# Patient Record
Sex: Male | Born: 1970 | Race: Black or African American | Hispanic: No | Marital: Single | Smoking: Current every day smoker
Health system: Southern US, Community
[De-identification: ages and names within clinical notes are randomized; demographics above are authoritative.]

---

## 2021-04-29 ENCOUNTER — Emergency Department (HOSPITAL_COMMUNITY): Payer: Medicaid Other

## 2021-04-29 ENCOUNTER — Inpatient Hospital Stay (HOSPITAL_COMMUNITY)
Admission: EM | Admit: 2021-04-29 | Discharge: 2021-07-22 | DRG: 004 | Disposition: A | Discharge status: Skilled Nursing Facility | Payer: Medicaid Other | Attending: Internal Medicine | Admitting: Internal Medicine

## 2021-04-29 DIAGNOSIS — I469 Cardiac arrest, cause unspecified: Secondary | ICD-10-CM | POA: Diagnosis not present

## 2021-04-29 DIAGNOSIS — F17210 Nicotine dependence, cigarettes, uncomplicated: Secondary | ICD-10-CM | POA: Diagnosis present

## 2021-04-29 DIAGNOSIS — R569 Unspecified convulsions: Secondary | ICD-10-CM | POA: Diagnosis present

## 2021-04-29 DIAGNOSIS — E870 Hyperosmolality and hypernatremia: Secondary | ICD-10-CM | POA: Diagnosis not present

## 2021-04-29 DIAGNOSIS — R509 Fever, unspecified: Secondary | ICD-10-CM

## 2021-04-29 DIAGNOSIS — G928 Other toxic encephalopathy: Secondary | ICD-10-CM | POA: Diagnosis present

## 2021-04-29 DIAGNOSIS — J40 Bronchitis, not specified as acute or chronic: Secondary | ICD-10-CM | POA: Diagnosis not present

## 2021-04-29 DIAGNOSIS — Z452 Encounter for adjustment and management of vascular access device: Secondary | ICD-10-CM

## 2021-04-29 DIAGNOSIS — Z20822 Contact with and (suspected) exposure to covid-19: Secondary | ICD-10-CM | POA: Diagnosis present

## 2021-04-29 DIAGNOSIS — I110 Hypertensive heart disease with heart failure: Secondary | ICD-10-CM | POA: Diagnosis present

## 2021-04-29 DIAGNOSIS — Z1612 Extended spectrum beta lactamase (ESBL) resistance: Secondary | ICD-10-CM | POA: Diagnosis not present

## 2021-04-29 DIAGNOSIS — L89620 Pressure ulcer of left heel, unstageable: Secondary | ICD-10-CM | POA: Diagnosis not present

## 2021-04-29 DIAGNOSIS — Z66 Do not resuscitate: Secondary | ICD-10-CM | POA: Diagnosis not present

## 2021-04-29 DIAGNOSIS — I429 Cardiomyopathy, unspecified: Secondary | ICD-10-CM | POA: Diagnosis present

## 2021-04-29 DIAGNOSIS — K5981 Ogilvie syndrome: Secondary | ICD-10-CM | POA: Diagnosis not present

## 2021-04-29 DIAGNOSIS — I5043 Acute on chronic combined systolic (congestive) and diastolic (congestive) heart failure: Secondary | ICD-10-CM | POA: Diagnosis present

## 2021-04-29 DIAGNOSIS — N390 Urinary tract infection, site not specified: Secondary | ICD-10-CM | POA: Diagnosis not present

## 2021-04-29 DIAGNOSIS — N170 Acute kidney failure with tubular necrosis: Secondary | ICD-10-CM | POA: Diagnosis present

## 2021-04-29 DIAGNOSIS — B9689 Other specified bacterial agents as the cause of diseases classified elsewhere: Secondary | ICD-10-CM | POA: Diagnosis not present

## 2021-04-29 DIAGNOSIS — I3139 Other pericardial effusion (noninflammatory): Secondary | ICD-10-CM | POA: Diagnosis present

## 2021-04-29 DIAGNOSIS — I69354 Hemiplegia and hemiparesis following cerebral infarction affecting left non-dominant side: Secondary | ICD-10-CM

## 2021-04-29 DIAGNOSIS — J15211 Pneumonia due to Methicillin susceptible Staphylococcus aureus: Secondary | ICD-10-CM | POA: Diagnosis present

## 2021-04-29 DIAGNOSIS — R0989 Other specified symptoms and signs involving the circulatory and respiratory systems: Secondary | ICD-10-CM

## 2021-04-29 DIAGNOSIS — K56609 Unspecified intestinal obstruction, unspecified as to partial versus complete obstruction: Secondary | ICD-10-CM | POA: Diagnosis not present

## 2021-04-29 DIAGNOSIS — J9601 Acute respiratory failure with hypoxia: Secondary | ICD-10-CM | POA: Diagnosis not present

## 2021-04-29 DIAGNOSIS — Z930 Tracheostomy status: Secondary | ICD-10-CM

## 2021-04-29 DIAGNOSIS — R1310 Dysphagia, unspecified: Secondary | ICD-10-CM | POA: Diagnosis not present

## 2021-04-29 DIAGNOSIS — Z8249 Family history of ischemic heart disease and other diseases of the circulatory system: Secondary | ICD-10-CM

## 2021-04-29 DIAGNOSIS — B370 Candidal stomatitis: Secondary | ICD-10-CM | POA: Diagnosis not present

## 2021-04-29 DIAGNOSIS — Z4659 Encounter for fitting and adjustment of other gastrointestinal appliance and device: Secondary | ICD-10-CM

## 2021-04-29 DIAGNOSIS — Z7401 Bed confinement status: Secondary | ICD-10-CM | POA: Diagnosis not present

## 2021-04-29 DIAGNOSIS — G253 Myoclonus: Secondary | ICD-10-CM | POA: Diagnosis present

## 2021-04-29 DIAGNOSIS — Z515 Encounter for palliative care: Secondary | ICD-10-CM

## 2021-04-29 DIAGNOSIS — J690 Pneumonitis due to inhalation of food and vomit: Secondary | ICD-10-CM | POA: Diagnosis not present

## 2021-04-29 DIAGNOSIS — K567 Ileus, unspecified: Secondary | ICD-10-CM | POA: Diagnosis not present

## 2021-04-29 DIAGNOSIS — R680 Hypothermia, not associated with low environmental temperature: Secondary | ICD-10-CM | POA: Diagnosis not present

## 2021-04-29 DIAGNOSIS — R570 Cardiogenic shock: Secondary | ICD-10-CM | POA: Diagnosis not present

## 2021-04-29 DIAGNOSIS — I5022 Chronic systolic (congestive) heart failure: Secondary | ICD-10-CM | POA: Diagnosis not present

## 2021-04-29 DIAGNOSIS — F1123 Opioid dependence with withdrawal: Secondary | ICD-10-CM | POA: Diagnosis not present

## 2021-04-29 DIAGNOSIS — A419 Sepsis, unspecified organism: Principal | ICD-10-CM | POA: Diagnosis present

## 2021-04-29 DIAGNOSIS — E1165 Type 2 diabetes mellitus with hyperglycemia: Secondary | ICD-10-CM | POA: Diagnosis present

## 2021-04-29 DIAGNOSIS — R110 Nausea: Secondary | ICD-10-CM

## 2021-04-29 DIAGNOSIS — R6521 Severe sepsis with septic shock: Secondary | ICD-10-CM | POA: Diagnosis present

## 2021-04-29 DIAGNOSIS — I5021 Acute systolic (congestive) heart failure: Secondary | ICD-10-CM | POA: Diagnosis not present

## 2021-04-29 DIAGNOSIS — R0600 Dyspnea, unspecified: Secondary | ICD-10-CM

## 2021-04-29 DIAGNOSIS — E785 Hyperlipidemia, unspecified: Secondary | ICD-10-CM | POA: Diagnosis present

## 2021-04-29 DIAGNOSIS — E860 Dehydration: Secondary | ICD-10-CM | POA: Diagnosis not present

## 2021-04-29 DIAGNOSIS — R0902 Hypoxemia: Secondary | ICD-10-CM

## 2021-04-29 DIAGNOSIS — T361X5A Adverse effect of cephalosporins and other beta-lactam antibiotics, initial encounter: Secondary | ICD-10-CM | POA: Diagnosis not present

## 2021-04-29 DIAGNOSIS — G931 Anoxic brain damage, not elsewhere classified: Secondary | ICD-10-CM | POA: Diagnosis present

## 2021-04-29 DIAGNOSIS — N179 Acute kidney failure, unspecified: Secondary | ICD-10-CM | POA: Diagnosis not present

## 2021-04-29 DIAGNOSIS — R0602 Shortness of breath: Secondary | ICD-10-CM

## 2021-04-29 DIAGNOSIS — Z9911 Dependence on respirator [ventilator] status: Secondary | ICD-10-CM | POA: Diagnosis not present

## 2021-04-29 DIAGNOSIS — R403 Persistent vegetative state: Secondary | ICD-10-CM | POA: Diagnosis not present

## 2021-04-29 DIAGNOSIS — J189 Pneumonia, unspecified organism: Secondary | ICD-10-CM | POA: Diagnosis not present

## 2021-04-29 DIAGNOSIS — I5020 Unspecified systolic (congestive) heart failure: Secondary | ICD-10-CM | POA: Diagnosis not present

## 2021-04-29 DIAGNOSIS — J432 Centrilobular emphysema: Secondary | ICD-10-CM | POA: Diagnosis present

## 2021-04-29 DIAGNOSIS — Z7189 Other specified counseling: Secondary | ICD-10-CM

## 2021-04-29 DIAGNOSIS — E871 Hypo-osmolality and hyponatremia: Secondary | ICD-10-CM | POA: Diagnosis not present

## 2021-04-29 DIAGNOSIS — R0603 Acute respiratory distress: Secondary | ICD-10-CM

## 2021-04-29 DIAGNOSIS — B961 Klebsiella pneumoniae [K. pneumoniae] as the cause of diseases classified elsewhere: Secondary | ICD-10-CM | POA: Diagnosis not present

## 2021-04-29 DIAGNOSIS — F411 Generalized anxiety disorder: Secondary | ICD-10-CM | POA: Diagnosis present

## 2021-04-29 DIAGNOSIS — I272 Pulmonary hypertension, unspecified: Secondary | ICD-10-CM | POA: Diagnosis present

## 2021-04-29 DIAGNOSIS — Z823 Family history of stroke: Secondary | ICD-10-CM

## 2021-04-29 DIAGNOSIS — E875 Hyperkalemia: Secondary | ICD-10-CM | POA: Diagnosis not present

## 2021-04-29 DIAGNOSIS — M6282 Rhabdomyolysis: Secondary | ICD-10-CM | POA: Diagnosis present

## 2021-04-29 DIAGNOSIS — G8929 Other chronic pain: Secondary | ICD-10-CM | POA: Diagnosis not present

## 2021-04-29 DIAGNOSIS — R911 Solitary pulmonary nodule: Secondary | ICD-10-CM | POA: Diagnosis present

## 2021-04-29 DIAGNOSIS — A499 Bacterial infection, unspecified: Secondary | ICD-10-CM

## 2021-04-29 DIAGNOSIS — E876 Hypokalemia: Secondary | ICD-10-CM | POA: Diagnosis not present

## 2021-04-29 DIAGNOSIS — Z79899 Other long term (current) drug therapy: Secondary | ICD-10-CM

## 2021-04-29 DIAGNOSIS — R140 Abdominal distension (gaseous): Secondary | ICD-10-CM

## 2021-04-29 DIAGNOSIS — Z0189 Encounter for other specified special examinations: Secondary | ICD-10-CM

## 2021-04-29 DIAGNOSIS — R0682 Tachypnea, not elsewhere classified: Secondary | ICD-10-CM

## 2021-04-29 DIAGNOSIS — D6489 Other specified anemias: Secondary | ICD-10-CM | POA: Diagnosis not present

## 2021-04-29 LAB — COMPREHENSIVE METABOLIC PANEL
ALT: 31 U/L (ref 0–44)
AST: 34 U/L (ref 15–41)
Albumin: 3 g/dL — ABNORMAL LOW (ref 3.5–5.0)
Alkaline Phosphatase: 49 U/L (ref 38–126)
Anion gap: 17 — ABNORMAL HIGH (ref 5–15)
BUN: 15 mg/dL (ref 6–20)
CO2: 14 mmol/L — ABNORMAL LOW (ref 22–32)
Calcium: 7.7 mg/dL — ABNORMAL LOW (ref 8.9–10.3)
Chloride: 109 mmol/L (ref 98–111)
Creatinine, Ser: 1.47 mg/dL — ABNORMAL HIGH (ref 0.61–1.24)
GFR, Estimated: 57 mL/min — ABNORMAL LOW (ref >60)
Glucose, Bld: 252 mg/dL — ABNORMAL HIGH (ref 70–99)
Potassium: 3.9 mmol/L (ref 3.5–5.1)
Sodium: 140 mmol/L (ref 135–145)
Total Bilirubin: 0.9 mg/dL (ref 0.3–1.2)
Total Protein: 5.7 g/dL — ABNORMAL LOW (ref 6.5–8.1)

## 2021-04-29 LAB — CBC WITH DIFFERENTIAL/PLATELET
Abs Immature Granulocytes: 0.53 10*3/uL — ABNORMAL HIGH (ref 0.00–0.07)
Basophils Absolute: 0.1 10*3/uL (ref 0.0–0.1)
Basophils Relative: 1 %
Eosinophils Absolute: 0.2 10*3/uL (ref 0.0–0.5)
Eosinophils Relative: 1 %
HCT: 46.4 % (ref 39.0–52.0)
Hemoglobin: 14.1 g/dL (ref 13.0–17.0)
Immature Granulocytes: 3 %
Lymphocytes Relative: 45 %
Lymphs Abs: 8.1 10*3/uL — ABNORMAL HIGH (ref 0.7–4.0)
MCH: 28.7 pg (ref 26.0–34.0)
MCHC: 30.4 g/dL (ref 30.0–36.0)
MCV: 94.3 fL (ref 80.0–100.0)
Monocytes Absolute: 0.7 10*3/uL (ref 0.1–1.0)
Monocytes Relative: 4 %
Neutro Abs: 8.1 10*3/uL — ABNORMAL HIGH (ref 1.7–7.7)
Neutrophils Relative %: 46 %
Platelets: 440 10*3/uL — ABNORMAL HIGH (ref 150–400)
RBC: 4.92 MIL/uL (ref 4.22–5.81)
RDW: 13.9 % (ref 11.5–15.5)
Smear Review: NORMAL
WBC: 17.6 10*3/uL — ABNORMAL HIGH (ref 4.0–10.5)
nRBC: 0.1 % (ref 0.0–0.2)

## 2021-04-29 LAB — I-STAT ARTERIAL BLOOD GAS, ED
Acid-base deficit: 14 mmol/L — ABNORMAL HIGH (ref 0.0–2.0)
Bicarbonate: 14.1 mmol/L — ABNORMAL LOW (ref 20.0–28.0)
Calcium, Ion: 1.1 mmol/L — ABNORMAL LOW (ref 1.15–1.40)
HCT: 39 % (ref 39.0–52.0)
Hemoglobin: 13.3 g/dL (ref 13.0–17.0)
O2 Saturation: 95 %
Patient temperature: 97.2
Potassium: 3.8 mmol/L (ref 3.5–5.1)
Sodium: 140 mmol/L (ref 135–145)
TCO2: 15 mmol/L — ABNORMAL LOW (ref 22–32)
pCO2 arterial: 37.1 mmHg (ref 32.0–48.0)
pH, Arterial: 7.183 — CL (ref 7.350–7.450)
pO2, Arterial: 90 mmHg (ref 83.0–108.0)

## 2021-04-29 LAB — I-STAT CHEM 8, ED
BUN: 17 mg/dL (ref 6–20)
Calcium, Ion: 1.04 mmol/L — ABNORMAL LOW (ref 1.15–1.40)
Chloride: 109 mmol/L (ref 98–111)
Creatinine, Ser: 1.2 mg/dL (ref 0.61–1.24)
Glucose, Bld: 240 mg/dL — ABNORMAL HIGH (ref 70–99)
HCT: 44 % (ref 39.0–52.0)
Hemoglobin: 15 g/dL (ref 13.0–17.0)
Potassium: 3.6 mmol/L (ref 3.5–5.1)
Sodium: 143 mmol/L (ref 135–145)
TCO2: 20 mmol/L — ABNORMAL LOW (ref 22–32)

## 2021-04-29 LAB — RESP PANEL BY RT-PCR (FLU A&B, COVID) ARPGX2
Influenza A by PCR: NEGATIVE
Influenza B by PCR: NEGATIVE
SARS Coronavirus 2 by RT PCR: NEGATIVE

## 2021-04-29 LAB — PHOSPHORUS: Phosphorus: 7 mg/dL — ABNORMAL HIGH (ref 2.5–4.6)

## 2021-04-29 LAB — MAGNESIUM: Magnesium: 2.1 mg/dL (ref 1.7–2.4)

## 2021-04-29 LAB — GLUCOSE, CAPILLARY: Glucose-Capillary: 124 mg/dL — ABNORMAL HIGH (ref 70–99)

## 2021-04-29 LAB — BRAIN NATRIURETIC PEPTIDE: B Natriuretic Peptide: 626.7 pg/mL — ABNORMAL HIGH (ref 0.0–100.0)

## 2021-04-29 LAB — LACTIC ACID, PLASMA: Lactic Acid, Venous: 10.4 mmol/L (ref 0.5–1.9)

## 2021-04-29 LAB — TROPONIN I (HIGH SENSITIVITY): Troponin I (High Sensitivity): 50 ng/L — ABNORMAL HIGH (ref <18)

## 2021-04-29 MED ORDER — FENTANYL CITRATE (PF) 100 MCG/2ML IJ SOLN
100.0000 ug | INTRAMUSCULAR | Status: DC | PRN
Start: 2021-04-29 — End: 2021-05-07
  Administered 2021-04-29 – 2021-05-02 (×6): 100 ug via INTRAVENOUS
  Administered 2021-05-02: 50 ug via INTRAVENOUS
  Administered 2021-05-02 – 2021-05-03 (×4): 100 ug via INTRAVENOUS
  Administered 2021-05-03: 50 ug via INTRAVENOUS
  Administered 2021-05-04 – 2021-05-05 (×4): 100 ug via INTRAVENOUS
  Filled 2021-04-29 (×15): qty 2

## 2021-04-29 MED ORDER — SODIUM CHLORIDE 0.9 % IV SOLN
2.0000 g | Freq: Once | INTRAVENOUS | Status: AC
  Administered 2021-04-30: 2 g via INTRAVENOUS
  Filled 2021-04-29: qty 2

## 2021-04-29 MED ORDER — NOREPINEPHRINE 4 MG/250ML-% IV SOLN
2.0000 ug/min | INTRAVENOUS | Status: DC
  Administered 2021-04-30: 8 ug/min via INTRAVENOUS
  Filled 2021-04-29: qty 250

## 2021-04-29 MED ORDER — SODIUM CHLORIDE 0.9 % IV SOLN
250.0000 mL | INTRAVENOUS | Status: DC
  Administered 2021-04-30 – 2021-06-15 (×12): 250 mL via INTRAVENOUS

## 2021-04-29 MED ORDER — IOHEXOL 350 MG/ML SOLN
50.0000 mL | Freq: Once | INTRAVENOUS | Status: AC | PRN
  Administered 2021-04-29: 50 mL via INTRAVENOUS

## 2021-04-29 MED ORDER — FENTANYL CITRATE (PF) 100 MCG/2ML IJ SOLN
100.0000 ug | INTRAMUSCULAR | Status: DC | PRN
  Administered 2021-04-29: 100 ug via INTRAVENOUS
  Filled 2021-04-29 (×2): qty 2

## 2021-04-29 MED ORDER — VANCOMYCIN HCL 1500 MG/300ML IV SOLN
1500.0000 mg | Freq: Every day | INTRAVENOUS | Status: DC
  Administered 2021-04-30: 1500 mg via INTRAVENOUS
  Filled 2021-04-29: qty 300

## 2021-04-29 MED ORDER — SUCCINYLCHOLINE CHLORIDE 20 MG/ML IJ SOLN
INTRAMUSCULAR | Status: AC | PRN
  Administered 2021-04-29: 100 mg via INTRAVENOUS

## 2021-04-29 MED ORDER — SODIUM CHLORIDE 0.9 % IV SOLN
2.0000 g | Freq: Three times a day (TID) | INTRAVENOUS | Status: DC
  Administered 2021-04-30: 2 g via INTRAVENOUS
  Filled 2021-04-29: qty 2

## 2021-04-29 MED ORDER — SODIUM CHLORIDE 0.9 % IV SOLN: INTRAVENOUS | Status: DC

## 2021-04-29 MED ORDER — LORAZEPAM 2 MG/ML IJ SOLN
INTRAMUSCULAR | Status: AC
  Filled 2021-04-29: qty 1

## 2021-04-29 MED ORDER — SODIUM CHLORIDE 0.9 % IV BOLUS
1000.0000 mL | Freq: Once | INTRAVENOUS | Status: AC
  Administered 2021-04-29: 1000 mL via INTRAVENOUS

## 2021-04-29 MED ORDER — HEPARIN SODIUM (PORCINE) 5000 UNIT/ML IJ SOLN
5000.0000 [IU] | Freq: Three times a day (TID) | INTRAMUSCULAR | Status: DC
  Administered 2021-04-30 – 2021-07-22 (×247): 5000 [IU] via SUBCUTANEOUS
  Filled 2021-04-29 (×244): qty 1

## 2021-04-29 MED ORDER — PROPOFOL 1000 MG/100ML IV EMUL
5.0000 ug/kg/min | INTRAVENOUS | Status: DC
  Administered 2021-04-29: 25 ug/kg/min via INTRAVENOUS

## 2021-04-29 MED ORDER — CHLORHEXIDINE GLUCONATE 0.12% ORAL RINSE (MEDLINE KIT)
15.0000 mL | Freq: Two times a day (BID) | OROMUCOSAL | Status: DC
  Administered 2021-04-30 – 2021-05-31 (×65): 15 mL via OROMUCOSAL

## 2021-04-29 MED ORDER — ETOMIDATE 2 MG/ML IV SOLN
INTRAVENOUS | Status: AC | PRN
  Administered 2021-04-29: 40 mg via INTRAVENOUS

## 2021-04-29 MED ORDER — MIDAZOLAM HCL 2 MG/2ML IJ SOLN
2.0000 mg | INTRAMUSCULAR | Status: DC | PRN
  Filled 2021-04-29: qty 2

## 2021-04-29 MED ORDER — INSULIN ASPART 100 UNIT/ML IJ SOLN
0.0000 [IU] | INTRAMUSCULAR | Status: DC
  Administered 2021-04-30 – 2021-05-02 (×5): 2 [IU] via SUBCUTANEOUS
  Administered 2021-05-02: 3 [IU] via SUBCUTANEOUS
  Administered 2021-05-02 – 2021-05-03 (×3): 2 [IU] via SUBCUTANEOUS
  Administered 2021-05-03: 3 [IU] via SUBCUTANEOUS
  Administered 2021-05-03 – 2021-05-19 (×31): 2 [IU] via SUBCUTANEOUS
  Administered 2021-05-20: 1 [IU] via SUBCUTANEOUS

## 2021-04-29 MED ORDER — NOREPINEPHRINE 4 MG/250ML-% IV SOLN
0.0000 ug/min | INTRAVENOUS | Status: DC
Start: 2021-04-29 — End: 2021-04-29
  Administered 2021-04-29: 5 ug/min via INTRAVENOUS

## 2021-04-29 MED ORDER — MIDAZOLAM HCL 2 MG/2ML IJ SOLN
2.0000 mg | INTRAMUSCULAR | Status: AC | PRN
Start: 2021-04-29 — End: 2021-05-01
  Administered 2021-04-30 – 2021-05-01 (×3): 2 mg via INTRAVENOUS
  Filled 2021-04-29 (×2): qty 2

## 2021-04-29 MED ORDER — ORAL CARE MOUTH RINSE
15.0000 mL | OROMUCOSAL | Status: DC
  Administered 2021-04-30 – 2021-05-30 (×305): 15 mL via OROMUCOSAL

## 2021-04-29 MED ORDER — SODIUM CHLORIDE 0.9 % IV SOLN
500.0000 mg | Freq: Once | INTRAVENOUS | Status: AC
  Administered 2021-04-30: 500 mg via INTRAVENOUS
  Filled 2021-04-29: qty 500

## 2021-04-29 MED ORDER — PANTOPRAZOLE SODIUM 40 MG IV SOLR
40.0000 mg | Freq: Every day | INTRAVENOUS | Status: DC
  Administered 2021-04-30: 40 mg via INTRAVENOUS
  Filled 2021-04-29: qty 40

## 2021-04-29 MED ORDER — FENTANYL BOLUS VIA INFUSION
25.0000 ug | INTRAVENOUS | Status: DC | PRN
Start: 2021-04-29 — End: 2021-05-01
  Administered 2021-04-30: 25 ug via INTRAVENOUS
  Filled 2021-04-29: qty 100

## 2021-04-29 MED ORDER — FENTANYL CITRATE (PF) 100 MCG/2ML IJ SOLN: 25.0000 ug | Freq: Once | INTRAMUSCULAR | Status: DC

## 2021-04-29 MED ORDER — SODIUM BICARBONATE 8.4 % IV SOLN
100.0000 meq | Freq: Once | INTRAVENOUS | Status: AC
  Administered 2021-04-29: 100 meq via INTRAVENOUS

## 2021-04-29 MED ORDER — LACTATED RINGERS IV BOLUS
1000.0000 mL | Freq: Once | INTRAVENOUS | Status: AC
  Administered 2021-04-30: 1000 mL via INTRAVENOUS

## 2021-04-29 MED ORDER — PROPOFOL 1000 MG/100ML IV EMUL: 5.0000 ug/kg/min | INTRAVENOUS | Status: DC

## 2021-04-29 MED ORDER — FENTANYL 2500MCG IN NS 250ML (10MCG/ML) PREMIX INFUSION
25.0000 ug/h | INTRAVENOUS | Status: DC
  Administered 2021-04-29: 50 ug/h via INTRAVENOUS
  Administered 2021-05-01: 100 ug/h via INTRAVENOUS
  Filled 2021-04-29 (×2): qty 250

## 2021-04-29 NOTE — Progress Notes (Signed)
Pharmacy Antibiotic Note  Vincent [name] is a 50 y.o. male admitted on 04/29/2021 with sepsis.  Pharmacy has been consulted for vancomycin and cefepime dosing.  Plan: Vancomycin 1500mg  IV q24h (eAUC 473, Cr 1.2mg /dL Cefepime 2g IV -Monitor renal function, clinical status, and antibiotic plan  Height: 5\' 8"  (172.7 cm) Weight: 70 kg (154 lb 5.2 oz) IBW/kg (Calculated) : 68.4  Temp (24hrs), Avg:95.6 F (35.3 C), Min:93.8 F (34.3 C), Max:97.3 F (36.3 C)  Recent Labs  Lab 04/29/21 2040 04/29/21 2041 04/29/21 2052  WBC 17.6*  --   --   CREATININE 1.47*  --  1.20  LATICACIDVEN  --  10.4*  --     Estimated Creatinine Clearance: 70.5 mL/min (by C-G formula based on SCr of 1.2 mg/dL).    No Known Allergies  Antimicrobials this admission: Cefepime 8/2 >>  Vanc 8/2 >>  Flagyl x1   Dose adjustments this admission: N/A  Microbiology results: Nothing ordered  Thank you for allowing pharmacy to be a part of this patient's care.  06/29/21, PharmD, [facility] Emergency Medicine Clinical Pharmacist ED RPh Phone: [phone] Main RX: [phone]

## 2021-04-29 NOTE — ED Provider Notes (Signed)
Emergency Department Provider Note   I have reviewed the triage vital signs and the nursing notes.   HISTORY  Chief Complaint Post CPR   HPI Vincent [name] is a 50 y.o. male presents to the emergency department after cardiac arrest s/p ROSC with EMS. They state they were called on scene with report of respiratory distress.  They state when they arrived the patient seemed to have more agonal respirations with bradycardia but seemed less like respiratory distress to them.  Patient was unable to provide significant history.  He ultimately lost pulses and CPR was initiated.  He received an epi push and a King airway was placed.  IO access was established and an epi drip was started.  EMS was also able to establish an 18-gauge IV in the left AC. ROSC achieved and patient transported. No defib required. While pulseless patient was in PEA per bedside report.   Level 5 caveat: post-CPR  Patient's niece arrives to bedside to provide additional history.  She states that the patient has a prior history of stroke with baseline left-sided weakness.  He lives at home but is independent.  He lives with another disabled family member.  Family is unsure if he has been receiving care or taking medicines over the past several months.  They report that he woke up feeling dizzy and not well this morning.  He had what sounds like a syncope event witnessed by family at which point they realized he was not breathing well and called EMS.   No past medical history on file.  Patient Active Problem List   Diagnosis Date Noted   Cardiac arrest [facility]) 04/29/2021    Allergies Patient has no known allergies.  No family history on file.  Social History    Review of Systems  Level 5 caveat: Post CPR  ____________________________________________   PHYSICAL EXAM:  VITAL SIGNS: ED Triage Vitals [04/29/21 2042]  Enc Vitals Group     BP (!) 79/62     Pulse Rate (!) 104     Resp 18     Temp (!)  97.3 F (36.3 C)     Temp Source Temporal     SpO2 95 %   Constitutional: Unresponsive but spontaneous respirations noted.  Eyes: Conjunctivae are normal. PERRL (3 mm and sluggish).  Head: Atraumatic. Nose: No congestion/rhinnorhea. Mouth/Throat: King airway in place without visible vomitus or blood in the airway.  Neck: No stridor.   Cardiovascular: Tachycardia. Good peripheral circulation. Grossly normal heart sounds.   Respiratory: Spontaneous respirations.  No retractions. Lungs CTAB. Gastrointestinal: Soft abdomen with distention.  Musculoskeletal: No gross deformities of extremities. Neurologic: Unresponsive to pain but spontaneous respirations noted.  Skin:  Skin is warm, dry and intact. No rash noted.  ____________________________________________   LABS (all labs ordered are listed, but only abnormal results are displayed)  Labs Reviewed  COMPREHENSIVE METABOLIC PANEL - Abnormal; Notable for the following components:      Result Value   CO2 14 (*)    Glucose, Bld 252 (*)    Creatinine, Ser 1.47 (*)    Calcium 7.7 (*)    Total Protein 5.7 (*)    Albumin 3.0 (*)    GFR, Estimated 57 (*)    Anion gap 17 (*)    All other components within normal limits  CBC WITH DIFFERENTIAL/PLATELET - Abnormal; Notable for the following components:   WBC 17.6 (*)    Platelets 440 (*)    Neutro Abs 8.1 (*)  Lymphs Abs 8.1 (*)    Abs Immature Granulocytes 0.53 (*)    All other components within normal limits  LACTIC ACID, PLASMA - Abnormal; Notable for the following components:   Lactic Acid, Venous 10.4 (*)    All other components within normal limits  LACTIC ACID, PLASMA - Abnormal; Notable for the following components:   Lactic Acid, Venous 7.1 (*)    All other components within normal limits  PHOSPHORUS - Abnormal; Notable for the following components:   Phosphorus 7.0 (*)    All other components within normal limits  BRAIN NATRIURETIC PEPTIDE - Abnormal; Notable for the  following components:   B Natriuretic Peptide 626.7 (*)    All other components within normal limits  ACETAMINOPHEN LEVEL - Abnormal; Notable for the following components:   Acetaminophen (Tylenol), Serum <10 (*)    All other components within normal limits  SALICYLATE LEVEL - Abnormal; Notable for the following components:   Salicylate Lvl <3.4 (*)    All other components within normal limits  RAPID URINE DRUG SCREEN, HOSP PERFORMED - Abnormal; Notable for the following components:   Benzodiazepines POSITIVE (*)    Tetrahydrocannabinol POSITIVE (*)    All other components within normal limits  BASIC METABOLIC PANEL - Abnormal; Notable for the following components:   Potassium 5.3 (*)    CO2 14 (*)    BUN 21 (*)    Creatinine, Ser 1.80 (*)    Calcium 7.3 (*)    GFR, Estimated 45 (*)    All other components within normal limits  PROTIME-INR - Abnormal; Notable for the following components:   Prothrombin Time 15.5 (*)    All other components within normal limits  CBC - Abnormal; Notable for the following components:   WBC 20.1 (*)    Platelets 463 (*)    All other components within normal limits  BASIC METABOLIC PANEL - Abnormal; Notable for the following components:   CO2 15 (*)    Glucose, Bld 124 (*)    Creatinine, Ser 1.50 (*)    Calcium 7.9 (*)    GFR, Estimated 56 (*)    All other components within normal limits  PHOSPHORUS - Abnormal; Notable for the following components:   Phosphorus 5.3 (*)    All other components within normal limits  HEMOGLOBIN A1C - Abnormal; Notable for the following components:   Hgb A1c MFr Bld 5.8 (*)    All other components within normal limits  GLUCOSE, CAPILLARY - Abnormal; Notable for the following components:   Glucose-Capillary 124 (*)    All other components within normal limits  GLUCOSE, CAPILLARY - Abnormal; Notable for the following components:   Glucose-Capillary 120 (*)    All other components within normal limits  GLUCOSE,  CAPILLARY - Abnormal; Notable for the following components:   Glucose-Capillary 120 (*)    All other components within normal limits  GLUCOSE, CAPILLARY - Abnormal; Notable for the following components:   Glucose-Capillary 128 (*)    All other components within normal limits  I-STAT CHEM 8, ED - Abnormal; Notable for the following components:   Glucose, Bld 240 (*)    Calcium, Ion 1.04 (*)    TCO2 20 (*)    All other components within normal limits  I-STAT ARTERIAL BLOOD GAS, ED - Abnormal; Notable for the following components:   pH, Arterial 7.183 (*)    Bicarbonate 14.1 (*)    TCO2 15 (*)    Acid-base deficit 14.0 (*)  Calcium, Ion 1.10 (*)    All other components within normal limits  POCT I-STAT 7, (LYTES, BLD GAS, ICA,H+H) - Abnormal; Notable for the following components:   pH, Arterial 7.301 (*)    pO2, Arterial 63 (*)    Bicarbonate 16.4 (*)    TCO2 17 (*)    Acid-base deficit 9.0 (*)    Calcium, Ion 1.05 (*)    All other components within normal limits  POCT I-STAT 7, (LYTES, BLD GAS, ICA,H+H) - Abnormal; Notable for the following components:   pCO2 arterial 25.2 (*)    Bicarbonate 14.9 (*)    TCO2 16 (*)    Acid-base deficit 9.0 (*)    Calcium, Ion 1.05 (*)    All other components within normal limits  TROPONIN I (HIGH SENSITIVITY) - Abnormal; Notable for the following components:   Troponin I (High Sensitivity) 50 (*)    All other components within normal limits  TROPONIN I (HIGH SENSITIVITY) - Abnormal; Notable for the following components:   Troponin I (High Sensitivity) 143 (*)    All other components within normal limits  RESP PANEL BY RT-PCR (FLU A&B, COVID) ARPGX2  MRSA NEXT GEN BY PCR, NASAL  MAGNESIUM  ETHANOL  APTT  MAGNESIUM  PATHOLOGIST SMEAR REVIEW  BLOOD GAS, ARTERIAL  BLOOD GAS, ARTERIAL  TROPONIN I (HIGH SENSITIVITY)   ____________________________________________  EKG   EKG Interpretation  Date/Time:  Tuesday April 29 2021 20:39:48  EDT Ventricular Rate:  106 PR Interval:  139 QRS Duration: 88 QT Interval:  395 QTC Calculation: 525 R Axis:   -8 Text Interpretation: Sinus tachycardia Biatrial enlargement Inferior infarct, old Lateral leads are also involved Prolonged QT interval No old tracing for comparison Confirmed by Nanda Quinton [phone]) on 04/29/2021 8:47:20 PM Also confirmed by Nanda Quinton [phone]), editor Hattie Perch ([id number])  on 04/30/2021 7:52:39 AM        ____________________________________________  RADIOLOGY  CT HEAD WO CONTRAST (5MM)  Result Date: 04/29/2021 CLINICAL DATA:  Mental status change EXAM: CT HEAD WITHOUT CONTRAST TECHNIQUE: Contiguous axial images were obtained from the base of the skull through the vertex without intravenous contrast. COMPARISON:  None. FINDINGS: Brain: No acute territorial infarction, hemorrhage or intracranial mass is visualized. Chronic right MCA infarct with extensive encephalomalacia involving the right frontal, parietal and temporal lobes as well as the right thalamus and basal ganglia. Moderate atrophy. Ex vacuo dilatation of right lateral ventricle. Atrophy of right brainstem. Vascular: No hyperdense vessels.  Carotid vascular calcification Skull: Normal. Negative for fracture or focal lesion. Sinuses/Orbits: Mucosal thickening in the sinuses. Chronic appearing deformity of the medial wall right orbit. Other: Incomplete fusion posterior arch of C1 IMPRESSION: 1. No definite CT evidence for acute intracranial abnormality. 2. Atrophy and chronic right MCA infarct. Electronically Signed   By: Donavan Foil M.D.   On: 04/29/2021 22:09   CT Angio Chest PE W and/or Wo Contrast  Result Date: 04/29/2021 CLINICAL DATA:  PE suspected, high prob Post CPR. EXAM: CT ANGIOGRAPHY CHEST WITH CONTRAST TECHNIQUE: Multidetector CT imaging of the chest was performed using the standard protocol during bolus administration of intravenous contrast. Multiplanar CT image reconstructions and MIPs  were obtained to evaluate the vascular anatomy. CONTRAST:  22m OMNIPAQUE IOHEXOL 350 MG/ML SOLN COMPARISON:  Chest radiograph earlier today. FINDINGS: Cardiovascular: There are no filling defects within the pulmonary arteries to suggest pulmonary embolus. Mild aortic atherosclerosis. Cannot assess for dissection given phase of contrast tailored to pulmonary arteries S1. Multi chamber cardiomegaly. Minimal  contrast refluxes into the hepatic veins and IVC. Moderate size circumferential pericardial effusion. This measures up to 17 mm in depth adjacent to the right ventricle. Mediastinum/Nodes: Shotty mediastinal adenopathy, including right anterior paratracheal node measuring 10 mm, series 5, image 37. Bilateral hilar lymph nodes measuring 9-10 mm. The esophagus is decompressed by enteric tube. No visualized thyroid nodule. Lungs/Pleura: The endotracheal tube tip is at the level of the carina, recommend retraction of 2-3 cm. Dense lower lobe consolidation, left greater than right, suspicious for aspiration. There additional patchy, ground-glass and confluent airspace opacities throughout both lungs. Mild smooth septal thickening. Underlying emphysema which is partially obscured by superimposed airspace disease. There is a 2.1 x 2.6 cm nodular density posteriorly in the left upper lobe abutting the pleura, series 6, image 26, partially obscured by adjacent airspace disease. Small bilateral pleural effusions, as well as fluid tracking into the right minor fissure. No pneumothorax. Upper Abdomen: No adrenal nodule. No acute upper abdominal findings. Probable scarring in the upper left kidney. Motion obscures evaluation of the upper abdomen. Musculoskeletal: No acute osseous abnormality. No anterior rib fractures typically seen with CPR. No focal bone lesion. Review of the MIP images confirms the above findings. IMPRESSION: 1. No pulmonary embolus. 2. Multi chamber cardiomegaly with moderate circumferential pericardial  effusion. Minimal contrast refluxes into the hepatic veins and IVC consistent with elevated right heart pressures. 3. Dense lower lobe consolidation, left greater than right, suspicious for aspiration. Small bilateral pleural effusions. 4. There is set the thickening in ground-glass opacities suspicious for pulmonary edema. Superimposed airspace disease within there is a ground-glass opacity may represent confluent edema or infection. 5. Shotty mediastinal and hilar adenopathy is likely reactive, but nonspecific. 6. Endotracheal tube tip is at the level of the carina, recommend retraction of 2-3 cm. 7. A 2.1 x 2.6 cm nodular density posteriorly in the left upper lobe abutting the pleura is nonspecific given the adjacent parenchymal findings, however recommend attention at follow-up to exclude the possibility of pulmonary mass. Aortic Atherosclerosis (ICD10-I70.0) and Emphysema (ICD10-J43.9). Electronically Signed   By: Keith Rake M.D.   On: 04/29/2021 22:02   DG Chest Portable 1 View  Result Date: 04/29/2021 CLINICAL DATA:  Post CPR.  Cardiac arrest EXAM: PORTABLE CHEST 1 VIEW COMPARISON:  None FINDINGS: Endotracheal tube terminates 2.2 cm above carina. External pacer/defibrillator is. Midline trachea. Mild cardiomegaly. No pleural effusion or pneumothorax. Interstitial and airspace disease is relatively diffuse but greater on the left than right. IMPRESSION: Appropriate position of endotracheal tube. Cardiomegaly with left greater than right interstitial and airspace disease. Favor asymmetric pulmonary edema. Given asymmetry, aspiration is possible but felt less likely. Electronically Signed   By: Abigail Miyamoto M.D.   On: 04/29/2021 20:53   EEG adult  Result Date: 04/30/2021 Greta Doom, MD     04/30/2021  2:15 AM History: 50 year old male status post cardiac arrest Sedation: Propofol Technique: This is a 21 channel routine scalp EEG performed at the bedside with bipolar and monopolar montages  arranged in accordance to the international 10/20 system of electrode placement. One channel was dedicated to EKG recording. Background: The background is diffusely attenuated with ventilator artifact.  There is some degree of muscle artifact throughout most of the recording, but no definite background activity is seen.  He has several episodes of "shaking" without definite EEG change, other than significant muscle artifact.  With one of these episodes muscle artifact does significantly obscure the background, but to a degree I can tell there  was no significant EEG change. Photic stimulation: Physiologic driving is not performed EEG Abnormalities: Diffusely attenuated background Clinical Interpretation: This EEG is severely abnormal with diffuse attenuation of the background.  There was no evidence of the muscle jerking seen was epileptiform in nature.  There was no seizure or seizure predisposition recorded on this study. Please note that lack of epileptiform activity on EEG does not preclude the possibility of epilepsy. Roland Rack, MD [facility] [phone] If 7pm- 7am, please page neurology on call as listed in [place].   Overnight EEG with video  Result Date: 04/30/2021 Lora Havens, MD     04/30/2021  9:03 AM Patient Name: Vincent [name] MRN: [id number] Epilepsy Attending: Lora Havens Referring Physician/Provider: Montey Hora, PA Duration: 04/30/2021 0202 to 04/30/2021 0900 Patient history: 50 year old male status post cardiac arrest. EEG to evaluate for seizure Level of alertness:  comatose AEDs during EEG study: LEV, propofol Technical aspects: This EEG study was done with scalp electrodes positioned according to the 10-20 International system of electrode placement. Electrical activity was acquired at a sampling rate of 500Hz  and reviewed with a high frequency filter of 70Hz  and a low frequency filter of 1Hz . EEG data were recorded continuously and digitally stored.  Description: EEG showed continuous generalized background suppression.  EEG was not reactive to tactile stimulation.  Event button was pressed on 04/30/2021 at 0752 for tremors in arms and chest. Concomitant EEG before, during and after the event did not show any EEG changes suggest seizure. Hyperventilation and photic stimulation were not performed.   ABNORMALITY -Background suppression, generalized IMPRESSION: This study is suggestive of profound diffuse encephalopathy, nonspecific to etiology.  However with a history of cardiac arrest this could be secondary to anoxic/hypoxic brain injury, sedation.  No seizures or epileptiform discharges were seen throughout the recording. Event button was pressed on 04/30/2021 at 0752 for tremors in arms and chest without concomitant EEG change. This was most likely not an epileptic event. Priyanka Barbra Sarks    ____________________________________________   PROCEDURES  Procedure(s) performed:   Procedure Name: Intubation Date/Time: 04/29/2021 8:53 PM Performed by: Margette Fast, MD Pre-anesthesia Checklist: Patient identified, Patient being monitored, Emergency Drugs available and Suction available Preoxygenation: Pre-oxygenation with 100% oxygen Induction Type: Rapid sequence Ventilation: Oral airway inserted - appropriate to patient size and Mask ventilation without difficulty Laryngoscope Size: Glidescope and 4 Grade View: Grade III Tube size: 7.5 mm Number of attempts: 1 Airway Equipment and Method: Video-laryngoscopy Placement Confirmation: ETT inserted through vocal cords under direct vision, Positive ETCO2 and CO2 detector Secured at: 25 cm Tube secured with: ETT holder Dental Injury: Teeth and Oropharynx as per pre-operative assessment     .Critical Care  Date/Time: 04/30/2021 11:19 AM Performed by: Margette Fast, MD Authorized by: Margette Fast, MD   Critical care provider statement:    Critical care time (minutes):  75   Critical care time  was exclusive of:  Separately billable procedures and treating other patients and teaching time   Critical care was necessary to treat or prevent imminent or life-threatening deterioration of the following conditions:  Respiratory failure, circulatory failure, cardiac failure and shock   Critical care was time spent personally by me on the following activities:  Discussions with consultants, evaluation of patient's response to treatment, examination of patient, ordering and performing treatments and interventions, ordering and review of laboratory studies, ordering and review of radiographic studies, pulse oximetry, re-evaluation of patient's condition, obtaining history from patient or surrogate,  review of old charts, blood draw for specimens, development of treatment plan with patient or surrogate and ventilator management   I assumed direction of critical care for this patient from another provider in my specialty: no     Care discussed with: admitting provider     ____________________________________________   INITIAL IMPRESSION / [place] / ED COURSE  Pertinent labs & imaging results that were available during my care of the patient were reviewed by me and considered in my medical decision making (see chart for details).   Patient arrives to the emergency department after cardiac arrest.  EMS arrived on scene to find him with agonal respirations but describe his clinical appearance seeming less like respiratory distress.  He subsequently deteriorated and required brief CPR with epinephrine.  He arrives hypotensive, tachycardic, and with a King airway.  He is afebrile.  He is having some spontaneous respirations.  King airway was switched for endotracheal tube without difficulty.  Chest x-ray shows diffuse haziness by my bedside read.  Do not see an obvious pneumothorax.  Have initiated Levophed peripherally and will send labs, COVID screen, and obtain CT head and CT angio of the chest  to evaluate for PE if possible.   CT with pulmonary edema pattern, dilated heart, and pericardial effusion. No PE. CT head without acute changes. Labs with elevated lactate and leukocytosis. Will cover with abx. Patient tolerating levophed infusion and vent now transitioned to propofol for sedation. Some breathing over the vent and biting the tube at times. After propofol appears more comfortable.   Discussed patient's case with ICU to request admission. Patient and family (if present) updated with plan. Care transferred to ICU service.  I reviewed all nursing notes, vitals, pertinent old records, EKGs, labs, imaging (as available).  ____________________________________________  FINAL CLINICAL IMPRESSION(S) / ED DIAGNOSES  Final diagnoses:  Cardiac arrest ([place])  Acute respiratory failure with hypoxia ([place])     MEDICATIONS GIVEN DURING THIS VISIT:  Medications  fentaNYL (SUBLIMAZE) injection 100 mcg (100 mcg Intravenous Given 04/29/21 2119)  midazolam (VERSED) injection 2 mg (2 mg Intravenous Given 04/30/21 0141)  fentaNYL (SUBLIMAZE) injection 25 mcg (25 mcg Intravenous Not Given 04/29/21 2208)  fentaNYL 2536mg in NS 2597m(1046mml) infusion-PREMIX (75 mcg/hr Intravenous Infusion Verify 04/30/21 0800)  fentaNYL (SUBLIMAZE) bolus via infusion 25-100 mcg (25 mcg Intravenous Bolus from Bag 04/30/21 0136)  0.9 %  sodium chloride infusion ( Intravenous Paused 04/30/21 0756)  norepinephrine (LEVOPHED) 4mg26m 250mL51mmix infusion (4 mcg/min Intravenous Infusion Verify 04/30/21 0800)  heparin injection 5,000 Units (5,000 Units Subcutaneous Given 04/30/21 0602)  pantoprazole (PROTONIX) injection 40 mg (40 mg Intravenous Given 04/30/21 0031)  insulin aspart (novoLOG) injection 0-15 Units (0 Units Subcutaneous Not Given 04/30/21 0743)  chlorhexidine gluconate (MEDLINE KIT) (PERIDEX) 0.12 % solution 15 mL (15 mLs Mouth Rinse Given 04/30/21 0758)  MEDLINE mouth rinse (15 mLs Mouth Rinse Given 04/30/21 0930)   Chlorhexidine Gluconate Cloth 2 % PADS 6 each (6 each Topical Given 04/30/21 0000)  levETIRAcetam (KEPPRA) IVPB 500 mg/100 mL premix (500 mg Intravenous New Bag/Given 04/30/21 0927)  midazolam (VERSED) injection 2 mg (2 mg Intravenous Given 04/30/21 0100)  propofol (DIPRIVAN) 1000 MG/100ML infusion (30 mcg/kg/min  70.1 kg Intravenous New Bag/Given 04/30/21 1054)  feeding supplement (VITAL HIGH PROTEIN) liquid 1,000 mL (1,000 mLs Per Tube Given 04/30/21 0931)  feeding supplement (PROSource TF) liquid 45 mL (45 mLs Per Tube Given 04/30/21 0921)  sodium bicarbonate 150 mEq in sterile water 1,150 mL  infusion ( Intravenous New Bag/Given 04/30/21 0941)  cefTRIAXone (ROCEPHIN) 2 g in sodium chloride 0.9 % 100 mL IVPB (has no administration in time range)  azithromycin (ZITHROMAX) 500 mg in sodium chloride 0.9 % 250 mL IVPB (500 mg Intravenous New Bag/Given 04/30/21 1018)  etomidate (AMIDATE) injection (40 mg Intravenous Given 04/29/21 2035)  succinylcholine (ANECTINE) injection (100 mg Intravenous Given 04/29/21 2035)  sodium chloride 0.9 % bolus 1,000 mL (0 mLs Intravenous Stopped 04/29/21 2121)  sodium bicarbonate injection 100 mEq (100 mEq Intravenous Given 04/29/21 2209)  LORazepam (ATIVAN) 2 MG/ML injection (  Given 04/29/21 2136)  iohexol (OMNIPAQUE) 350 MG/ML injection 50 mL (50 mLs Intravenous Contrast Given 04/29/21 2150)  ceFEPIme (MAXIPIME) 2 g in sodium chloride 0.9 % 100 mL IVPB (0 g Intravenous Stopped 04/30/21 0048)  azithromycin (ZITHROMAX) 500 mg in sodium chloride 0.9 % 250 mL IVPB (0 mg Intravenous Stopped 04/30/21 0256)  lactated ringers bolus 1,000 mL ( Intravenous Stopped 04/30/21 0217)  levETIRAcetam (KEPPRA) IVPB 1000 mg/100 mL premix (0 mg Intravenous Stopped 04/30/21 0139)     Note:  This document was prepared using Dragon voice recognition software and may include unintentional dictation errors.  Nanda Quinton, MD, [facility] Emergency Medicine    Allyana Vogan, [facility], MD 04/30/21 [phone]

## 2021-04-29 NOTE — ED Triage Notes (Signed)
Pt post CPR, witnessed arrest by family. EMS called for [facility], found pt agonal 4-6 breaths/min. Junctional rhythm, then asystole. CPR -> ROSC, 1 epi bolus then drip, 2.5 versed, fentanyl. King airway in place on arrival, IO L tib, 18LAC 110/70

## 2021-04-29 NOTE — H&P (Signed)
NAMEQuashaun [name], MRN:  [id number], DOB:  [birth year], LOS: 0 ADMISSION DATE:  04/29/2021, CONSULTATION DATE: 04/29/2021 REFERRING MD: Jacqulyn Bath, ED, CHIEF COMPLAINT: Patient unable to provide given intubated and sedated, cardiac arrest  History of Present Illness:  50 year old admitted to ICU after PEA arrest, likely respiratory driven.  ED note reviewed.  History unobtainable for patient as he is intubated and sedated.  History per chart and sister at bedside.  Patient was not acting right, relatively unresponsive, EMS called.  Found to be agonal breathing.  Unclear initial pulse ox was.  Eventually lost pulse.  CPR was initiated.  1 dose of epi.  About 6 months CPR.  ROSC obtained.  King airway placed.  Transported to the ED.  King airway exchanged for ET tube.  Noted to be hypotensive so norepinephrine started peripherally.  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  CTA PE protocol monitor patient reveals no PE, significant bilateral airspace disease was dense consolidations in the lower lobes, significant emphysema in upper lobes with interstitial thickening felt to be most consistent with pneumonitis/infection versus volume overload.  Moderate pericardial effusion noted by the radiologist.  Other findings as below.  He was given broad-spectrum antibiotics.  Initial lactate over 10.  1 L crystalloid given.  At time of evaluation he is on fentanyl drip and propofol.  Pertinent  Medical History  Tobacco abuse, emphysema, CVA, diabetes  Significant Hospital Events: Including procedures, antibiotic start and stop dates in addition to other pertinent events   8/2 PEA arrest at home, [facility] airway in the field, CPR x6 minutes and epi x1, ROSC, intubated in the ED, admitted to PCCM  Interim History / Subjective:  N/A  Objective   Blood pressure (!) 106/91, pulse 99, temperature (!) 93.8 F (34.3 C), resp. rate (!) 24, height 5\' 8"  (1.727 m), weight 70 kg, SpO2 98 %.    Vent Mode: PRVC FiO2  (%):  [100 %] 100 % Set Rate:  [18 bmp] 18 bmp Vt Set:  [550 mL] 550 mL PEEP:  [5 cmH20] 5 cmH20 Plateau Pressure:  [26 cmH20] 26 cmH20   Intake/Output Summary (Last 24 hours) at 04/29/2021 2252 Last data filed at 04/29/2021 2121 Gross per 24 hour  Intake 1000 ml  Output --  Net 1000 ml   Filed Weights   04/29/21 2206  Weight: 70 kg    Examination: General: Lying in stretcher, sedated, intubated Eyes: Pupils small reactive, no icterus Lungs: Coarse, junky bilaterally, ventilated sounds Cardiovascular: Tachycardic, no murmurs, warm, no lower extremity edema Mouth: Dry   Resolved Hospital Problem list     Assessment & Plan:  Cardiac arrest, PEA, likely respiratory driven: Agonal breathing on scene, bilateral infiltrates on CT scan although admittedly could be aspiration in the setting of CPR.  Pretty significant emphysema seen on CT scan. --Normothermia, avoid fevers --TTE, trend troponins  Septic shock, severe sepsis with AKI due to pneumonia: On pressors, lactic acid greater than 10.  Bilateral infiltrates on chest x-ray. --Additional 1 L crystalloid bolus, this will approach 30 cc/kg --MAP goal 65, continue norepinephrine currently peripherally --Broad-spectrum antibiotics to cover pneumonia, consider de-escalation to CAP coverage --HIV test, obtain lower respiratory culture --TTE  Acute hypoxemic  respiratory failure: Presumably led to cardiac arrest.  Possible COPD exacerbation/pneumonia versus aspiration pneumonia in the setting of CPR.  COVID PCR negative. --PRVC, VAP bundle --Antibiotics to cover pneumonia  Toxic metabolic encephalopathy: In setting of cardiac arrest, sedation needed for ventilator.  He  is biting on tube, moving extremities, also shivering. --Fentanyl drip, midazolam IV as needed, DC propofol given hypotension --Old stroke seen on CT, if concern for poor mental status consider EEG in the future this could be nidus of epileptiform discharges --Urine  drug screen, alcohol level  Pericardial effusion: Suspect incidental, at this time do not think significant contributor to hypotension. --TTE  DM2 with hyperglycemia: Sugars greater than 250 on arrival. --SSI --Recommend adding basal insulin once to be started or if sliding scale does not adequately reduce hyperglycemia  Possible lung mass, 2 and half centimeters: Versus pneumonia given dense consolidations.  Recommend attention to follow-up on outpatient CT scan if he goes on to survive this admission given first-degree relative with lung cancer and history of cigarette smoking with emphysema on CT.  Updated sister at bedside in the ED.  Best Practice (right click and "Reselect all SmartList Selections" daily)   Diet/type: NPO w/ oral meds DVT prophylaxis: prophylactic heparin  GI prophylaxis: PPI Lines: N/A Foley:  Yes, and it is still needed Code Status:  full code Last date of multidisciplinary goals of care discussion [n/a]  Labs   CBC: Recent Labs  Lab 04/29/21 2040 04/29/21 2052 04/29/21 2124  WBC 17.6*  --   --   NEUTROABS 8.1*  --   --   HGB 14.1 15.0 13.3  HCT 46.4 44.0 39.0  MCV 94.3  --   --   PLT 440*  --   --     Basic Metabolic Panel: Recent Labs  Lab 04/29/21 2040 04/29/21 2052 04/29/21 2124  NA 140 143 140  K 3.9 3.6 3.8  CL 109 109  --   CO2 14*  --   --   GLUCOSE 252* 240*  --   BUN 15 17  --   CREATININE 1.47* 1.20  --   CALCIUM 7.7*  --   --   MG 2.1  --   --   PHOS 7.0*  --   --    GFR: Estimated Creatinine Clearance: 70.5 mL/min (by C-G formula based on SCr of 1.2 mg/dL). Recent Labs  Lab 04/29/21 2040 04/29/21 2041  WBC 17.6*  --   LATICACIDVEN  --  10.4*    Liver Function Tests: Recent Labs  Lab 04/29/21 2040  AST 34  ALT 31  ALKPHOS 49  BILITOT 0.9  PROT 5.7*  ALBUMIN 3.0*   No results for input(s): LIPASE, AMYLASE in the last 168 hours. No results for input(s): AMMONIA in the last 168 hours.  ABG    Component  Value Date/Time   PHART 7.183 (LL) 04/29/2021 2124   PCO2ART 37.1 04/29/2021 2124   PO2ART 90 04/29/2021 2124   HCO3 14.1 (L) 04/29/2021 2124   TCO2 15 (L) 04/29/2021 2124   ACIDBASEDEF 14.0 (H) 04/29/2021 2124   O2SAT 95.0 04/29/2021 2124     Coagulation Profile: No results for input(s): INR, PROTIME in the last 168 hours.  Cardiac Enzymes: No results for input(s): CKTOTAL, CKMB, CKMBINDEX, TROPONINI in the last 168 hours.  HbA1C: No results found for: HGBA1C  CBG: No results for input(s): GLUCAP in the last 168 hours.  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 Authorizing Provider  amLODipine (NORVASC) 5 MG tablet Take 5 mg by mouth daily. 04/09/21  Yes [provider]  atorvastatin (LIPITOR) 40 MG tablet Take 40 mg by mouth at bedtime. 04/09/21  Yes [provider]     Critical care time:     CRITICAL CARE Performed by: Karren Burly   Total critical care time: 40 minutes  Critical care time was exclusive of separately billable procedures and treating other patients.  Critical care was necessary to treat or prevent imminent or life-threatening deterioration.  Critical care was time spent personally by me on the following activities: development of treatment plan with patient and/or surrogate as well as nursing, discussions with consultants, evaluation of patient's response to treatment, examination of  patient, obtaining history from patient or surrogate, ordering and performing treatments and interventions, ordering and review of laboratory studies, ordering and review of radiographic studies, pulse oximetry and re-evaluation of patient's condition.

## 2021-04-30 ENCOUNTER — Inpatient Hospital Stay (HOSPITAL_COMMUNITY): Payer: Medicaid Other

## 2021-04-30 DIAGNOSIS — I469 Cardiac arrest, cause unspecified: Secondary | ICD-10-CM

## 2021-04-30 DIAGNOSIS — J9601 Acute respiratory failure with hypoxia: Secondary | ICD-10-CM

## 2021-04-30 LAB — POCT I-STAT 7, (LYTES, BLD GAS, ICA,H+H)
Acid-base deficit: 9 mmol/L — ABNORMAL HIGH (ref 0.0–2.0)
Acid-base deficit: 9 mmol/L — ABNORMAL HIGH (ref 0.0–2.0)
Bicarbonate: 16.4 mmol/L — ABNORMAL LOW (ref 20.0–28.0)
Bicarbonate: 14.9 mmol/L — ABNORMAL LOW (ref 20.0–28.0)
Calcium, Ion: 1.05 mmol/L — ABNORMAL LOW (ref 1.15–1.40)
Calcium, Ion: 1.05 mmol/L — ABNORMAL LOW (ref 1.15–1.40)
HCT: 41 % (ref 39.0–52.0)
HCT: 41 % (ref 39.0–52.0)
Hemoglobin: 13.9 g/dL (ref 13.0–17.0)
Hemoglobin: 13.9 g/dL (ref 13.0–17.0)
O2 Saturation: 90 %
O2 Saturation: 97 %
Patient temperature: 98.5
Patient temperature: 96.8
Potassium: 4.8 mmol/L (ref 3.5–5.1)
Potassium: 4.4 mmol/L (ref 3.5–5.1)
Sodium: 142 mmol/L (ref 135–145)
Sodium: 140 mmol/L (ref 135–145)
TCO2: 17 mmol/L — ABNORMAL LOW (ref 22–32)
TCO2: 16 mmol/L — ABNORMAL LOW (ref 22–32)
pCO2 arterial: 33.2 mmHg (ref 32.0–48.0)
pCO2 arterial: 25.2 mmHg — ABNORMAL LOW (ref 32.0–48.0)
pH, Arterial: 7.301 — ABNORMAL LOW (ref 7.350–7.450)
pH, Arterial: 7.375 (ref 7.350–7.450)
pO2, Arterial: 63 mmHg — ABNORMAL LOW (ref 83.0–108.0)
pO2, Arterial: 85 mmHg (ref 83.0–108.0)

## 2021-04-30 LAB — TROPONIN I (HIGH SENSITIVITY)
Troponin I (High Sensitivity): 143 ng/L (ref <18)
Troponin I (High Sensitivity): 289 ng/L (ref <18)
Troponin I (High Sensitivity): 324 ng/L (ref <18)
Troponin I (High Sensitivity): 254 ng/L (ref <18)
Troponin I (High Sensitivity): 218 ng/L (ref <18)

## 2021-04-30 LAB — CBC
HCT: 47.8 % (ref 39.0–52.0)
Hemoglobin: 14.7 g/dL (ref 13.0–17.0)
MCH: 28.5 pg (ref 26.0–34.0)
MCHC: 30.8 g/dL (ref 30.0–36.0)
MCV: 92.8 fL (ref 80.0–100.0)
Platelets: 463 10*3/uL — ABNORMAL HIGH (ref 150–400)
RBC: 5.15 MIL/uL (ref 4.22–5.81)
RDW: 14.2 % (ref 11.5–15.5)
WBC: 20.1 10*3/uL — ABNORMAL HIGH (ref 4.0–10.5)
nRBC: 0 % (ref 0.0–0.2)

## 2021-04-30 LAB — BASIC METABOLIC PANEL
Anion gap: 15 (ref 5–15)
Anion gap: 13 (ref 5–15)
Anion gap: 14 (ref 5–15)
BUN: 17 mg/dL (ref 6–20)
BUN: 21 mg/dL — ABNORMAL HIGH (ref 6–20)
BUN: 26 mg/dL — ABNORMAL HIGH (ref 6–20)
CO2: 15 mmol/L — ABNORMAL LOW (ref 22–32)
CO2: 14 mmol/L — ABNORMAL LOW (ref 22–32)
CO2: 17 mmol/L — ABNORMAL LOW (ref 22–32)
Calcium: 7.9 mg/dL — ABNORMAL LOW (ref 8.9–10.3)
Calcium: 7.3 mg/dL — ABNORMAL LOW (ref 8.9–10.3)
Calcium: 7.7 mg/dL — ABNORMAL LOW (ref 8.9–10.3)
Chloride: 110 mmol/L (ref 98–111)
Chloride: 111 mmol/L (ref 98–111)
Chloride: 101 mmol/L (ref 98–111)
Creatinine, Ser: 1.5 mg/dL — ABNORMAL HIGH (ref 0.61–1.24)
Creatinine, Ser: 1.8 mg/dL — ABNORMAL HIGH (ref 0.61–1.24)
Creatinine, Ser: 1.98 mg/dL — ABNORMAL HIGH (ref 0.61–1.24)
GFR, Estimated: 56 mL/min — ABNORMAL LOW (ref >60)
GFR, Estimated: 45 mL/min — ABNORMAL LOW (ref >60)
GFR, Estimated: 40 mL/min — ABNORMAL LOW (ref >60)
Glucose, Bld: 124 mg/dL — ABNORMAL HIGH (ref 70–99)
Glucose, Bld: 99 mg/dL (ref 70–99)
Glucose, Bld: 127 mg/dL — ABNORMAL HIGH (ref 70–99)
Potassium: 4.1 mmol/L (ref 3.5–5.1)
Potassium: 5.3 mmol/L — ABNORMAL HIGH (ref 3.5–5.1)
Potassium: 3.5 mmol/L (ref 3.5–5.1)
Sodium: 140 mmol/L (ref 135–145)
Sodium: 138 mmol/L (ref 135–145)
Sodium: 132 mmol/L — ABNORMAL LOW (ref 135–145)

## 2021-04-30 LAB — GLUCOSE, CAPILLARY
Glucose-Capillary: 120 mg/dL — ABNORMAL HIGH (ref 70–99)
Glucose-Capillary: 120 mg/dL — ABNORMAL HIGH (ref 70–99)
Glucose-Capillary: 128 mg/dL — ABNORMAL HIGH (ref 70–99)
Glucose-Capillary: 106 mg/dL — ABNORMAL HIGH (ref 70–99)
Glucose-Capillary: 107 mg/dL — ABNORMAL HIGH (ref 70–99)
Glucose-Capillary: 125 mg/dL — ABNORMAL HIGH (ref 70–99)

## 2021-04-30 LAB — RAPID URINE DRUG SCREEN, HOSP PERFORMED
Amphetamines: NOT DETECTED
Barbiturates: NOT DETECTED
Benzodiazepines: POSITIVE — AB
Cocaine: NOT DETECTED
Opiates: NOT DETECTED
Tetrahydrocannabinol: POSITIVE — AB

## 2021-04-30 LAB — HEMOGLOBIN A1C
Hgb A1c MFr Bld: 5.8 % — ABNORMAL HIGH (ref 4.8–5.6)
Mean Plasma Glucose: 119.76 mg/dL

## 2021-04-30 LAB — MRSA NEXT GEN BY PCR, NASAL: MRSA by PCR Next Gen: NOT DETECTED

## 2021-04-30 LAB — PROTIME-INR
INR: 1.2 (ref 0.8–1.2)
Prothrombin Time: 15.5 seconds — ABNORMAL HIGH (ref 11.4–15.2)

## 2021-04-30 LAB — ECHOCARDIOGRAM COMPLETE
Area-P 1/2: 3.66 cm2
Height: 68 in
Weight: 2472.68 oz

## 2021-04-30 LAB — MAGNESIUM: Magnesium: 2.1 mg/dL (ref 1.7–2.4)

## 2021-04-30 LAB — LACTIC ACID, PLASMA: Lactic Acid, Venous: 7.1 mmol/L (ref 0.5–1.9)

## 2021-04-30 LAB — ETHANOL: Alcohol, Ethyl (B): <10 mg/dL (ref <10)

## 2021-04-30 LAB — SALICYLATE LEVEL: Salicylate Lvl: <7 mg/dL — ABNORMAL LOW (ref 7.0–30.0)

## 2021-04-30 LAB — PHOSPHORUS: Phosphorus: 5.3 mg/dL — ABNORMAL HIGH (ref 2.5–4.6)

## 2021-04-30 LAB — APTT: aPTT: 33 seconds (ref 24–36)

## 2021-04-30 LAB — ACETAMINOPHEN LEVEL: Acetaminophen (Tylenol), Serum: <10 ug/mL — ABNORMAL LOW (ref 10–30)

## 2021-04-30 MED ORDER — SODIUM CHLORIDE 0.9 % IV SOLN
500.0000 mg | INTRAVENOUS | Status: DC
  Administered 2021-04-30: 500 mg via INTRAVENOUS
  Filled 2021-04-30 (×2): qty 500

## 2021-04-30 MED ORDER — VITAL AF 1.2 CAL PO LIQD
1000.0000 mL | ORAL | Status: DC
  Administered 2021-04-30 – 2021-05-01 (×2): 1000 mL

## 2021-04-30 MED ORDER — LEVETIRACETAM IN NACL 1000 MG/100ML IV SOLN
1000.0000 mg | Freq: Once | INTRAVENOUS | Status: AC
  Administered 2021-04-30: 1000 mg via INTRAVENOUS
  Filled 2021-04-30: qty 100

## 2021-04-30 MED ORDER — PROSOURCE TF PO LIQD
45.0000 mL | Freq: Two times a day (BID) | ORAL | Status: DC
  Administered 2021-04-30: 45 mL
  Filled 2021-04-30: qty 45

## 2021-04-30 MED ORDER — NOREPINEPHRINE 4 MG/250ML-% IV SOLN: 0.0000 ug/min | INTRAVENOUS | Status: DC

## 2021-04-30 MED ORDER — VITAL HIGH PROTEIN PO LIQD
1000.0000 mL | ORAL | Status: DC
  Administered 2021-04-30: 1000 mL

## 2021-04-30 MED ORDER — PANTOPRAZOLE SODIUM 40 MG PO PACK
40.0000 mg | PACK | Freq: Every day | ORAL | Status: DC
  Administered 2021-04-30 – 2021-06-10 (×42): 40 mg
  Filled 2021-04-30 (×45): qty 20

## 2021-04-30 MED ORDER — MIDAZOLAM HCL 2 MG/2ML IJ SOLN
2.0000 mg | INTRAMUSCULAR | Status: DC | PRN
  Administered 2021-04-30 – 2021-05-03 (×6): 2 mg via INTRAVENOUS
  Filled 2021-04-30 (×7): qty 2

## 2021-04-30 MED ORDER — CEFTRIAXONE SODIUM 2 G IJ SOLR
2.0000 g | INTRAMUSCULAR | Status: DC
  Administered 2021-04-30 – 2021-05-04 (×5): 2 g via INTRAVENOUS
  Filled 2021-04-30 (×5): qty 20

## 2021-04-30 MED ORDER — CHLORHEXIDINE GLUCONATE CLOTH 2 % EX PADS
6.0000 | MEDICATED_PAD | Freq: Every day | CUTANEOUS | Status: DC
  Administered 2021-04-30 – 2021-05-09 (×10): 6 via TOPICAL

## 2021-04-30 MED ORDER — CALCIUM GLUCONATE-NACL 2-0.675 GM/100ML-% IV SOLN
2.0000 g | Freq: Once | INTRAVENOUS | Status: AC
  Administered 2021-04-30: 2000 mg via INTRAVENOUS
  Filled 2021-04-30: qty 100

## 2021-04-30 MED ORDER — STERILE WATER FOR INJECTION IV SOLN
INTRAVENOUS | Status: DC
  Filled 2021-04-30 (×4): qty 1000

## 2021-04-30 MED ORDER — MIDAZOLAM HCL 2 MG/2ML IJ SOLN: 2.0000 mg | INTRAMUSCULAR | Status: DC | PRN

## 2021-04-30 MED ORDER — MEPERIDINE HCL 25 MG/ML IJ SOLN
25.0000 mg | INTRAMUSCULAR | Status: DC | PRN
  Administered 2021-04-30 – 2021-05-01 (×4): 25 mg via INTRAVENOUS
  Filled 2021-04-30 (×4): qty 1

## 2021-04-30 MED ORDER — SODIUM CHLORIDE 0.9% FLUSH: 10.0000 mL | INTRAVENOUS | Status: DC | PRN

## 2021-04-30 MED ORDER — NOREPINEPHRINE 4 MG/250ML-% IV SOLN
0.0000 ug/min | INTRAVENOUS | Status: DC
Start: 2021-04-30 — End: 2021-05-06
  Administered 2021-04-30: 5 ug/min via INTRAVENOUS
  Administered 2021-05-01: 2 ug/min via INTRAVENOUS
  Filled 2021-04-30 (×2): qty 250

## 2021-04-30 MED ORDER — SODIUM CHLORIDE 0.9 % IV SOLN: 2.0000 g | Freq: Two times a day (BID) | INTRAVENOUS | Status: DC

## 2021-04-30 MED ORDER — SODIUM CHLORIDE 0.9% FLUSH
10.0000 mL | Freq: Two times a day (BID) | INTRAVENOUS | Status: DC
  Administered 2021-04-30 – 2021-06-04 (×63): 10 mL

## 2021-04-30 MED ORDER — PROPOFOL 1000 MG/100ML IV EMUL
5.0000 ug/kg/min | INTRAVENOUS | Status: DC
  Administered 2021-04-30: 25 ug/kg/min via INTRAVENOUS
  Administered 2021-04-30: 50 ug/kg/min via INTRAVENOUS
  Administered 2021-04-30: 30 ug/kg/min via INTRAVENOUS
  Administered 2021-04-30: 40 ug/kg/min via INTRAVENOUS
  Administered 2021-04-30: 25 ug/kg/min via INTRAVENOUS
  Administered 2021-05-01 (×3): 50 ug/kg/min via INTRAVENOUS
  Filled 2021-04-30: qty 100
  Filled 2021-04-30: qty 200
  Filled 2021-04-30: qty 100
  Filled 2021-04-30: qty 200
  Filled 2021-04-30: qty 100

## 2021-04-30 MED ORDER — LEVETIRACETAM IN NACL 500 MG/100ML IV SOLN
500.0000 mg | Freq: Two times a day (BID) | INTRAVENOUS | Status: DC
  Administered 2021-04-30 (×2): 500 mg via INTRAVENOUS
  Filled 2021-04-30 (×2): qty 100

## 2021-04-30 NOTE — Progress Notes (Signed)
Pt Arterial blood gas as follows:  PH 7.36 PCO2 26.3 PO2 91 HCO3 14.9

## 2021-04-30 NOTE — Plan of Care (Signed)

## 2021-04-30 NOTE — Progress Notes (Signed)
eLink Physician-Brief Progress Note Patient Name: Chrstopher Itay Mella DOB: [birth year] MRN: [id number]   Date of Service  04/30/2021  HPI/Events of Note  Nursing reports seizure activity.  eICU Interventions  Plan: Keppra 1 gm IV now, then 500 mg IV  Q 12 hours. Change Versed order to 2 mg IV Q 1 hour PRN sedation, agitation or seizures.     Intervention Category Major Interventions: Seizures - evaluation and management  Cynara Tatham [place] 04/30/2021, 1:01 AM

## 2021-04-30 NOTE — Procedures (Addendum)
Patient Name: Ardian Haberland  MRN: [id number]  Epilepsy Attending: Charlsie Quest  Referring Physician/Provider: Rutherford Guys, PA Duration: 04/30/2021 0202 to 05/01/2021 0202  Patient history: 50 year old male status post cardiac arrest. EEG to evaluate for seizure  Level of alertness:  comatose  AEDs during EEG study: LEV, propofol  Technical aspects: This EEG study was done with scalp electrodes positioned according to the 10-20 International system of electrode placement. Electrical activity was acquired at a sampling rate of 500Hz  and reviewed with a high frequency filter of 70Hz  and a low frequency filter of 1Hz . EEG data were recorded continuously and digitally stored.   Description: EEG initially showed continuous generalized background suppression.  EEG was not reactive to tactile stimulation.  Gradually EEG showed intermittent generalized sharply contoured 3 to 5 Hz theta-delta slowing with 2 to 3 seconds of generalized EEG suppression.  After around midnight on 05/01/2021, EEG showed near continuous 3 to 5 Hz theta-delta slowing. Spikes were also noted in right frontotemporal region.  Event button was pressed on 04/30/2021 at 0752 for tremors in arms and chest. Concomitant EEG before, during and after the event did not show any EEG changes suggest seizure.  Hyperventilation and photic stimulation were not performed.     ABNORMALITY -Background suppression, generalized -Continuous slow, generalized -Spikes, right frontotemporal region  IMPRESSION: This study showed evidence of epileptogenicity arising from right frontotemporal region.  The study was also initially suggestive of profound diffuse encephalopathy which gradually improved to severe diffuse encephalopathy, nonspecific to etiology.  However with a history of cardiac arrest this could be secondary to anoxic/hypoxic brain injury, sedation.  No seizures were seen throughout the recording.  Event button was pressed on  04/30/2021 at 0752 for tremors in arms and chest without concomitant EEG change. This was most likely not an epileptic event.   Cam Harnden 07/01/2021

## 2021-04-30 NOTE — Progress Notes (Signed)
NAME:  Vincent [name], MRN:  [id number], DOB:  01-09-1971, LOS: 1 ADMISSION DATE:  04/29/2021, CONSULTATION DATE:  04/29/21 REFERRING MD:  Dr Jacqulyn Bath, ED, CHIEF COMPLAINT:  Unable to provide given intubated status   Brief History   50 y/o M, admitted to ICU after PEA arrest, likely respiratory driven.  ED note reviewed.  History unobtainable for patient as he is intubated and sedated.  History per chart and sister at bedside.  Patient was not acting right, relatively unresponsive, EMS called.  Found to be agonal breathing.  Unclear initial pulse ox was.  Eventually lost pulse.  CPR was initiated.  1 dose of epi.  About 6 months CPR.  ROSC obtained.  King airway placed.  Transported to the ED.  King airway exchanged for ET tube.  Noted to be hypotensive so norepinephrine started peripherally.  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  CTA PE protocol monitor patient reveals no PE, significant bilateral airspace disease was dense consolidations in the lower lobes, significant emphysema in upper lobes with interstitial thickening felt to be most consistent with pneumonitis/infection versus volume overload.  Moderate pericardial effusion noted by the radiologist.  Other findings as below.  He was given broad-spectrum antibiotics.  Initial lactate over 10.  1 L crystalloid given.  At time of evaluation he is on fentanyl drip and propofol.    Past Medical History  Tobacco abuse Emphysema,  CVA,  DM  Significant Hospital Events   8/2: PEA Arrest, King airway in the field, CPR x6 minutes and epi x1, ROSC, Intubated in ED, Admitted to PCCM   Consults:  None  Procedures:  8/2: Intubated, IO LLE  Significant Diagnostic Tests:  CT HEAD WO CONTRAST ( )  Result Date: 04/29/2021 CLINICAL DATA:  Mental status change EXAM: CT HEAD WITHOUT CONTRAST TECHNIQUE: Contiguous axial images were obtained from the base of the skull through the vertex without intravenous contrast. COMPARISON:  None.  FINDINGS: Brain: No acute territorial infarction, hemorrhage or intracranial mass is visualized. Chronic right MCA infarct with extensive encephalomalacia involving the right frontal, parietal and temporal lobes as well as the right thalamus and basal ganglia. Moderate atrophy. Ex vacuo dilatation of right lateral ventricle. Atrophy of right brainstem. Vascular: No hyperdense vessels.  Carotid vascular calcification Skull: Normal. Negative for fracture or focal lesion. Sinuses/Orbits: Mucosal thickening in the sinuses. Chronic appearing deformity of the medial wall right orbit. Other: Incomplete fusion posterior arch of C1 IMPRESSION: 1. No definite CT evidence for acute intracranial abnormality. 2. Atrophy and chronic right MCA infarct. Electronically Signed   By: Jasmine Pang M.D.   On: 04/29/2021 22:09   CT Angio Chest PE W and/or Wo Contrast  Result Date: 04/29/2021 CLINICAL DATA:  PE suspected, high prob Post CPR. EXAM: CT ANGIOGRAPHY CHEST WITH CONTRAST TECHNIQUE: Multidetector CT imaging of the chest was performed using the standard protocol during bolus administration of intravenous contrast. Multiplanar CT image reconstructions and MIPs were obtained to evaluate the vascular anatomy. CONTRAST:  5mL OMNIPAQUE IOHEXOL 350 MG/ML SOLN COMPARISON:  Chest radiograph earlier today. FINDINGS: Cardiovascular: There are no filling defects within the pulmonary arteries to suggest pulmonary embolus. Mild aortic atherosclerosis. Cannot assess for dissection given phase of contrast tailored to pulmonary arteries S1. Multi chamber cardiomegaly. Minimal contrast refluxes into the hepatic veins and IVC. Moderate size circumferential pericardial effusion. This measures up to 17 mm in depth adjacent to the right ventricle. Mediastinum/Nodes: Shotty mediastinal adenopathy, including right anterior paratracheal node measuring 10  mm, series 5, image 37. Bilateral hilar lymph nodes measuring 9-10 mm. The esophagus is  decompressed by enteric tube. No visualized thyroid nodule. Lungs/Pleura: The endotracheal tube tip is at the level of the carina, recommend retraction of 2-3 cm. Dense lower lobe consolidation, left greater than right, suspicious for aspiration. There additional patchy, ground-glass and confluent airspace opacities throughout both lungs. Mild smooth septal thickening. Underlying emphysema which is partially obscured by superimposed airspace disease. There is a 2.1 x 2.6 cm nodular density posteriorly in the left upper lobe abutting the pleura, series 6, image 26, partially obscured by adjacent airspace disease. Small bilateral pleural effusions, as well as fluid tracking into the right minor fissure. No pneumothorax. Upper Abdomen: No adrenal nodule. No acute upper abdominal findings. Probable scarring in the upper left kidney. Motion obscures evaluation of the upper abdomen. Musculoskeletal: No acute osseous abnormality. No anterior rib fractures typically seen with CPR. No focal bone lesion. Review of the MIP images confirms the above findings. IMPRESSION: 1. No pulmonary embolus. 2. Multi chamber cardiomegaly with moderate circumferential pericardial effusion. Minimal contrast refluxes into the hepatic veins and IVC consistent with elevated right heart pressures. 3. Dense lower lobe consolidation, left greater than right, suspicious for aspiration. Small bilateral pleural effusions. 4. There is set the thickening in ground-glass opacities suspicious for pulmonary edema. Superimposed airspace disease within there is a ground-glass opacity may represent confluent edema or infection. 5. Shotty mediastinal and hilar adenopathy is likely reactive, but nonspecific. 6. Endotracheal tube tip is at the level of the carina, recommend retraction of 2-3 cm. 7. A 2.1 x 2.6 cm nodular density posteriorly in the left upper lobe abutting the pleura is nonspecific given the adjacent parenchymal findings, however recommend  attention at follow-up to exclude the possibility of pulmonary mass. Aortic Atherosclerosis (ICD10-I70.0) and Emphysema (ICD10-J43.9). Electronically Signed   By: Narda Rutherford M.D.   On: 04/29/2021 22:02   DG Chest Portable 1 View  Result Date: 04/29/2021 CLINICAL DATA:  Post CPR.  Cardiac arrest EXAM: PORTABLE CHEST 1 VIEW COMPARISON:  None FINDINGS: Endotracheal tube terminates 2.2 cm above carina. External pacer/defibrillator is. Midline trachea. Mild cardiomegaly. No pleural effusion or pneumothorax. Interstitial and airspace disease is relatively diffuse but greater on the left than right. IMPRESSION: Appropriate position of endotracheal tube. Cardiomegaly with left greater than right interstitial and airspace disease. Favor asymmetric pulmonary edema. Given asymmetry, aspiration is possible but felt less likely. Electronically Signed   By: Jeronimo Greaves M.D.   On: 04/29/2021 20:53   EEG adult  Result Date: 04/30/2021 Rejeana Brock, MD     04/30/2021  2:15 AM History: 50 year old male status post cardiac arrest Sedation: Propofol Technique: This is a 21 channel routine scalp EEG performed at the bedside with bipolar and monopolar montages arranged in accordance to the international 10/20 system of electrode placement. One channel was dedicated to EKG recording. Background: The background is diffusely attenuated with ventilator artifact.  There is some degree of muscle artifact throughout most of the recording, but no definite background activity is seen.  He has several episodes of "shaking" without definite EEG change, other than significant muscle artifact.  With one of these episodes muscle artifact does significantly obscure the background, but to a degree I can tell there was no significant EEG change. Photic stimulation: Physiologic driving is not performed EEG Abnormalities: Diffusely attenuated background Clinical Interpretation: This EEG is severely abnormal with diffuse attenuation of  the background.  There was no evidence of  the muscle jerking seen was epileptiform in nature.  There was no seizure or seizure predisposition recorded on this study. Please note that lack of epileptiform activity on EEG does not preclude the possibility of epilepsy. Ritta Slot, MD [facility] [phone] If 7pm- 7am, please page neurology on call as listed in AMION.   Overnight EEG with video  Result Date: 04/30/2021 Charlsie Quest, MD     04/30/2021  9:03 AM Patient Name: Vincent [name] MRN: [id number] Epilepsy Attending: Charlsie Quest Referring Physician/Provider: Rutherford Guys, PA Duration: 04/30/2021 0202 to 04/30/2021 0900 Patient history: 50 year old male status post cardiac arrest. EEG to evaluate for seizure Level of alertness:  comatose AEDs during EEG study: LEV, propofol Technical aspects: This EEG study was done with scalp electrodes positioned according to the 10-20 International system of electrode placement. Electrical activity was acquired at a sampling rate of 500Hz  and reviewed with a high frequency filter of 70Hz  and a low frequency filter of 1Hz . EEG data were recorded continuously and digitally stored. Description: EEG showed continuous generalized background suppression.  EEG was not reactive to tactile stimulation.  Event button was pressed on 04/30/2021 at 0752 for tremors in arms and chest. Concomitant EEG before, during and after the event did not show any EEG changes suggest seizure. Hyperventilation and photic stimulation were not performed.   ABNORMALITY -Background suppression, generalized IMPRESSION: This study is suggestive of profound diffuse encephalopathy, nonspecific to etiology.  However with a history of cardiac arrest this could be secondary to anoxic/hypoxic brain injury, sedation.  No seizures or epileptiform discharges were seen throughout the recording. Event button was pressed on 04/30/2021 at 0752 for tremors in arms and chest without  concomitant EEG change. This was most likely not an epileptic event. Priyanka     Micro Data:  MRSA swab: Negative Resp Panel: COVID, FLU negative  Antimicrobials:  Cefepime: 8/2>8/2 Vanc: 8/2>8/2 Azithromax: 8/2>> Ceftriaxone: 8/3>>   Interim history/subjective:  O/N Events: Intubated and admitted to the ICU, reported seizures given Keppra and Versed to 2 mg IV Q1H PRN   Patient seen at bedside, normothermic, intubated.   Objective   Blood pressure 97/81, pulse 98, temperature (!) 96.8 F (36 C), temperature source Esophageal, resp. rate (!) 24, height 5\' 8"  (1.727 m), weight 70.1 kg, SpO2 100 %.    Vent Mode: PRVC FiO2 (%):  [70 %-100 %] 70 % Set Rate:  [18 bmp-24 bmp] 24 bmp Vt Set:  [550 mL] 550 mL PEEP:  [5 cmH20-8 cmH20] 8 cmH20 Plateau Pressure:  [23 cmH20-27 cmH20] 25 cmH20   Intake/Output Summary (Last 24 hours) at 04/30/2021 1028 Last data filed at 04/30/2021 0800 Gross per 24 hour  Intake 4255.8 ml  Output 425 ml  Net 3830.8 ml   Filed Weights   04/29/21 2206 04/30/21 0000  Weight: 70 kg 70.1 kg    Examination: Physical Exam Constitutional:      Appearance: He is ill-appearing.     Comments: Ill appearing, intubated.   Eyes:     Comments: Pinpoint pupils, negative doll's eyes, corneal reflex in the L eye  Cardiovascular:     Rate and Rhythm: Normal rate and regular rhythm.     Pulses: Normal pulses.     Heart sounds: Normal heart sounds.  Pulmonary:     Effort: Pulmonary effort is normal.     Comments: Coarse breath sounds auscultated bilaterally.  Musculoskeletal:     Right lower leg: No edema.  Left lower leg: No edema.     Resolved Hospital Problem list     Assessment & Plan:  PEA Cardiac Arrest:  Likely respiratory driven given pneumonia findings on imaging. Currently intubated and TTM protocol. Last trop 145 from 50.  - Wean off TTM on 8/4 - Continue trending Trops -TTE Ordered - Normothermia, avoiding fevers  Septic Shock  in the Setting of Pneumonia AKI:  Patient presented to ED with PEA arrest with and found to have an AKI (1.47>1.5) and pneumonia on imaging, leukocytosis of 17.6>20.1, lactic acidosis 10>7.1, on broad spectrum antibiotics. Nasal MRSA negative. Will cover for CAP.  - DC vanc and cefepime - Start Ceftriaxone 2g IV QD - Start Azithromycin 500 mg IV QD - Avoid Nephrotoxic agents  Toxic Metabolic Encephalopathy in the Setting of PEA Arrest:  Noted to have seizure activity O/N. Loaded on Keppra and PRN Versed. Initial EEG negative for seizure activity. O/N EEG performed.  UDS showed THC and Benzos, benzos likely secondary to being intubated. Ethanol, salicylate, panel negative. Does have Hx of CVA, no new findings on CT head, but given extensive coverage of prior stroke this could be a nidus.  - F/U O/N EEG - Continue Fentanyl drip, Versed PRN  Pericardial Effusion:  Minimal effusion noted on bedside evaluation, will await TTE - F/U TTE  DMTII:  - SSI   Lung Mass:  2.5 cm, F/U with outpatient CT scan.    Best practice:  Diet: tube feeds  Pain/Anxiety/Delirium protocol (if indicated): Versed, fentanyl VAP protocol (if indicated): ordered DVT prophylaxis: SCDs GI prophylaxis: PPI Glucose control: SSI Mobility: bed bound Code Status: FULL Disposition: Pending medical management  Medical Decision Making    Diagnoses that are immediately life threatening include Cardiac arrest, sepsis 2/2 to pneumonia Interventions today to address these diagnoses are antibiotic changes, trending troponins Likelihood of life-threatening deterioration without intervention is high.  Labs   CBC: Recent Labs  Lab 04/29/21 2040 04/29/21 2052 04/29/21 2124 04/30/21 0022 04/30/21 0405 04/30/21 0815  WBC 17.6*  --   --  20.1*  --   --   NEUTROABS 8.1*  --   --   --   --   --   HGB 14.1 15.0 13.3 14.7 13.9 13.9  HCT 46.4 44.0 39.0 47.8 41.0 41.0  MCV 94.3  --   --  92.8  --   --   PLT 440*  --    --  463*  --   --     Basic Metabolic Panel: Recent Labs  Lab 04/29/21 2040 04/29/21 2052 04/29/21 2124 04/30/21 0022 04/30/21 0405 04/30/21 0815  NA 140 143 140 140 142 140  K 3.9 3.6 3.8 4.1 4.8 4.4  CL 109 109  --  110  --   --   CO2 14*  --   --  15*  --   --   GLUCOSE 252* 240*  --  124*  --   --   BUN 15 17  --  17  --   --   CREATININE 1.47* 1.20  --  1.50*  --   --   CALCIUM 7.7*  --   --  7.9*  --   --   MG 2.1  --   --  2.1  --   --   PHOS 7.0*  --   --  5.3*  --   --    GFR: Estimated Creatinine Clearance: 56.4 mL/min (A) (by C-G formula based on  SCr of 1.5 mg/dL (H)). Recent Labs  Lab 04/29/21 2040 04/29/21 2041 04/30/21 0022  WBC 17.6*  --  20.1*  LATICACIDVEN  --  10.4* 7.1*    Liver Function Tests: Recent Labs  Lab 04/29/21 2040  AST 34  ALT 31  ALKPHOS 49  BILITOT 0.9  PROT 5.7*  ALBUMIN 3.0*   No results for input(s): LIPASE, AMYLASE in the last 168 hours. No results for input(s): AMMONIA in the last 168 hours.  ABG    Component Value Date/Time   PHART 7.375 04/30/2021 0815   PCO2ART 25.2 (L) 04/30/2021 0815   PO2ART 85 04/30/2021 0815   HCO3 14.9 (L) 04/30/2021 0815   TCO2 16 (L) 04/30/2021 0815   ACIDBASEDEF 9.0 (H) 04/30/2021 0815   O2SAT 97.0 04/30/2021 0815     Coagulation Profile: Recent Labs  Lab 04/30/21 0022  INR 1.2    Cardiac Enzymes: No results for input(s): CKTOTAL, CKMB, CKMBINDEX, TROPONINI in the last 168 hours.  HbA1C: Hgb A1c MFr Bld  Date/Time Value Ref Range Status  04/30/2021 12:22 AM 5.8 (H) 4.8 - 5.6 % Final    Comment:    (NOTE) Pre diabetes:          5.7%-6.4%  Diabetes:              >6.4%  Glycemic control for   <7.0% adults with diabetes     CBG: Recent Labs  Lab 04/29/21 2343 04/30/21 0318 04/30/21 0739  GLUCAP 124* 120* 120*    Review of Systems:   Negative with exception to above  Past Medical History  He,  has no past medical history on file.   Surgical History      Social History      Family History   His family history is not on file.   Allergies No Known Allergies   Home Medications  Prior to Admission medications   Medication Sig Start Date End Date Taking? Authorizing Provider  amLODipine (NORVASC) 5 MG tablet Take 5 mg by mouth daily. 04/09/21  Yes [provider]  atorvastatin (LIPITOR) 40 MG tablet Take 40 mg by mouth at bedtime. 04/09/21  Yes [provider]     Critical care time: 35 minutes

## 2021-04-30 NOTE — Procedures (Signed)
Cortrak  Person Inserting Tube:  [name], RD Tube Type:  Cortrak - 43 inches Tube Size:  10 Tube Location:  Right nare Initial Placement:  Stomach Secured by: Bridle Technique Used to Measure Tube Placement:  Marking at nare/corner of mouth Cortrak Secured At:  62 cm  Cortrak Tube Team Note:  Consult received to place a Cortrak feeding tube.   X-ray is required, abdominal x-ray has been ordered by the Cortrak team. Please confirm tube placement before using the Cortrak tube.   If the tube becomes dislodged please keep the tube and contact the Cortrak team at [email or web address] (password [email or web address]) for replacement.  If after hours and replacement cannot be delayed, place a NG tube and confirm placement with an abdominal x-ray.    Anne Boltz MS, RD, LDN, CNSC Clinical Nutrition Pager listed in AMION   

## 2021-04-30 NOTE — Progress Notes (Addendum)
Initial Nutrition Assessment  DOCUMENTATION CODES:   Not applicable  INTERVENTION:   Initiate tube feeding via OG tube / Cortrak (once gastric placement confirmed by x-ray): Vital AF 1.2 at 25 ml/h, increase by 10 ml every 4 hours to goal rate of 65 ml/h (1560 ml per day)  Provides 1872 kcal (2207 kcal total with propofol), 117 gm protein, 1265 ml free water daily.  NUTRITION DIAGNOSIS:   Inadequate oral intake related to inability to eat as evidenced by NPO status.  GOAL:   Patient will meet greater than or equal to 90% of their needs  MONITOR:   Vent status, Labs, TF tolerance  REASON FOR ASSESSMENT:   Ventilator, Consult Enteral/tube feeding initiation and management  ASSESSMENT:   50 yo male admitted S/P PEA cardiac arrest at home. PMH includes tobacco abuse, emphysema, CVA, diabetes.  Discussed patient in ICU rounds and with RN today. Patient with AKI; aspiration vs CAP likely cause of PEA. Continuous EEG ongoing.  TTM (36 degrees) to continue until tomorrow.   Received MD Consult for TF initiation and management. Vital High Protein has been initiated via OG tube at 20 ml/h. OG tube in place. Cortrak has been ordered.   Patient is currently intubated on ventilator support MV: 12.8 L/min Temp (24hrs), Avg:97.1 F (36.2 C), Min:93.8 F (34.3 C), Max:98.7 F (37.1 C)  Propofol: 12.7 ml/hr providing 335 kcal from lipid  Labs reviewed. Phos 5.3 CBG: 124-120-120  Medications reviewed and include novolog, protonix, levophed, propofol, sodium bicarb.   NUTRITION - FOCUSED PHYSICAL EXAM:  Flowsheet Row Most Recent Value  Orbital Region No depletion  Upper Arm Region No depletion  Thoracic and Lumbar Region No depletion  Buccal Region Unable to assess  Temple Region Unable to assess  Clavicle Bone Region No depletion  Clavicle and Acromion Bone Region No depletion  Scapular Bone Region Unable to assess  Dorsal Hand No depletion  Patellar Region Unable  to assess  Anterior Thigh Region Unable to assess  Posterior Calf Region Unable to assess  Edema (RD Assessment) Unable to assess  Hair Unable to assess  Eyes Unable to assess  Mouth Unable to assess  Skin Reviewed  Nails Reviewed       Diet Order:   Diet Order     None       EDUCATION NEEDS:   Not appropriate for education at this time  Skin:  Skin Assessment: Reviewed RN Assessment  Last BM:  no BM documented  Height:   Ht Readings from Last 1 Encounters:  04/29/21 5\' 8"  (1.727 m)    Weight:   Wt Readings from Last 1 Encounters:  04/30/21 70.1 kg    Ideal Body Weight:  70 kg  BMI:  Body mass index is 23.5 kg/m.  Estimated Nutritional Needs:   Kcal:  1850  Protein:  110-125 gm  Fluid:  >/= 1.9 L    06/30/21, RD, LDN, CNSC Please refer to Amion for contact information.

## 2021-04-30 NOTE — Progress Notes (Signed)
eLink Physician-Brief Progress Note Patient Name: Vincent [name] DOB: [birth year] MRN: [id number]   Date of Service  04/30/2021  HPI/Events of Note  Order for Propofol IV infusion has fallen off the list of ordered medications. In light of seizure activity, will continue the Propofol iV infusion.  eICU Interventions  Plan: Propofol IV infusion. Titrate to RASS = 0 to -1.      Intervention Category Major Interventions: Other:  Yoana Staib Dennard Nip 04/30/2021, 1:47 AM

## 2021-04-30 NOTE — Progress Notes (Signed)
LTM EEG hooked up and running - no initial skin breakdown - push button tested - neuro notified. [facility] monitoring.  

## 2021-04-30 NOTE — Progress Notes (Signed)
EEG complete - results pending 

## 2021-04-30 NOTE — Progress Notes (Signed)
Received patient from ED on full support mechanical ventilation.

## 2021-04-30 NOTE — Procedures (Signed)
History: 50 year old male status post cardiac arrest  Sedation: Propofol  Technique: This is a 21 channel routine scalp EEG performed at the bedside with bipolar and monopolar montages arranged in accordance to the international 10/20 system of electrode placement. One channel was dedicated to EKG recording.    Background: The background is diffusely attenuated with ventilator artifact.  There is some degree of muscle artifact throughout most of the recording, but no definite background activity is seen.  He has several episodes of "shaking" without definite EEG change, other than significant muscle artifact.  With one of these episodes muscle artifact does significantly obscure the background, but to a degree I can tell there was no significant EEG change.   Photic stimulation: Physiologic driving is not performed  EEG Abnormalities: Diffusely attenuated background  Clinical Interpretation: This EEG is severely abnormal with diffuse attenuation of the background.  There was no evidence of the muscle jerking seen was epileptiform in nature.    There was no seizure or seizure predisposition recorded on this study. Please note that lack of epileptiform activity on EEG does not preclude the possibility of epilepsy.   Ritta Slot, MD [facility] [phone]  If 7pm- 7am, please page neurology on call as listed in AMION.

## 2021-04-30 NOTE — Progress Notes (Signed)
Pharmacy Antibiotic Note  Vincent [name] is a 50 y.o. male admitted on 04/29/2021 with sepsis.  Pharmacy has been consulted for vancomycin and cefepime dosing. Elevated WBC. Lactic acid remains elevated at 7.1 this morning. Will adjust antibiotic dosing based on decreased renal function.  Plan: Vancomycin 1500mg  IV q24h (eAUC 473, Cr 1.2mg /dL Change Cefepime to 2g IV q12h -Monitor renal function, clinical status, and antibiotic plan  Height: 5\' 8"  (172.7 cm) Weight: 70.1 kg (154 lb 8.7 oz) IBW/kg (Calculated) : 68.4  Temp (24hrs), Avg:97.2 F (36.2 C), Min:93.8 F (34.3 C), Max:98.7 F (37.1 C)  Recent Labs  Lab 04/29/21 2040 04/29/21 2041 04/29/21 2052 04/30/21 0022  WBC 17.6*  --   --  20.1*  CREATININE 1.47*  --  1.20 1.50*  LATICACIDVEN  --  10.4*  --  7.1*     Estimated Creatinine Clearance: 56.4 mL/min (A) (by C-G formula based on SCr of 1.5 mg/dL (H)).    No Known Allergies  Antimicrobials this admission: Cefepime 8/2 >>  Vanc 8/2 >>  Flagyl x1   Dose adjustments this admission: N/A  Microbiology results: 8/2 MRSA PCR negative   Thank you for allowing pharmacy to participate in this patient's care.  [id number], PharmD PGY1 Pharmacy Resident 04/30/2021 8:11 AM Check [email or web address] for unit specific pharmacy number

## 2021-04-30 NOTE — Procedures (Signed)
Central Venous Catheter Insertion Procedure Note  Vincent [name]  [id number]  10/31/1970  Date:04/30/21  Time:12:49 PM   Provider Performing:Milicent Acheampong Sande Brothers   Procedure: Insertion of Non-tunneled Central Venous [phone]) with US guidance ([id number])   Indication(s) Medication administration  Consent Risks of the procedure as well as the alternatives and risks of each were explained to the patient and/or caregiver.  Consent for the procedure was obtained and is signed in the bedside chart  Anesthesia Topical only with 1% lidocaine   Timeout Verified patient identification, verified procedure, site/side was marked, verified correct patient position, special equipment/implants available, medications/allergies/relevant history reviewed, required imaging and test results available.  Sterile Technique Maximal sterile technique including full sterile barrier drape, hand hygiene, sterile gown, sterile gloves, mask, hair covering, sterile ultrasound probe cover (if used).  Procedure Description Area of catheter insertion was cleaned with chlorhexidine and draped in sterile fashion.  With real-time ultrasound guidance a central venous catheter was placed into the left internal jugular vein. Nonpulsatile blood flow and easy flushing noted in all ports.  The catheter was sutured in place and sterile dressing applied.  Complications/Tolerance None; patient tolerated the procedure well. Chest X-ray is ordered to verify placement for internal jugular or subclavian cannulation.   Chest x-ray is not ordered for femoral cannulation.  EBL Minimal  Specimen(s) None

## 2021-04-30 NOTE — Progress Notes (Signed)
  Echocardiogram 2D Echocardiogram has been performed.  Vincent [name] 04/30/2021, 11:31 AM

## 2021-05-01 ENCOUNTER — Inpatient Hospital Stay (HOSPITAL_COMMUNITY): Payer: Medicaid Other

## 2021-05-01 DIAGNOSIS — I469 Cardiac arrest, cause unspecified: Secondary | ICD-10-CM | POA: Diagnosis not present

## 2021-05-01 DIAGNOSIS — J9601 Acute respiratory failure with hypoxia: Secondary | ICD-10-CM

## 2021-05-01 LAB — CBC
HCT: 37.3 % — ABNORMAL LOW (ref 39.0–52.0)
Hemoglobin: 12.3 g/dL — ABNORMAL LOW (ref 13.0–17.0)
MCH: 28.7 pg (ref 26.0–34.0)
MCHC: 33 g/dL (ref 30.0–36.0)
MCV: 86.9 fL (ref 80.0–100.0)
Platelets: 339 10*3/uL (ref 150–400)
RBC: 4.29 MIL/uL (ref 4.22–5.81)
RDW: 14.4 % (ref 11.5–15.5)
WBC: 9.4 10*3/uL (ref 4.0–10.5)
nRBC: 0 % (ref 0.0–0.2)

## 2021-05-01 LAB — BASIC METABOLIC PANEL
Anion gap: 12 (ref 5–15)
Anion gap: 11 (ref 5–15)
BUN: 27 mg/dL — ABNORMAL HIGH (ref 6–20)
BUN: 28 mg/dL — ABNORMAL HIGH (ref 6–20)
CO2: 19 mmol/L — ABNORMAL LOW (ref 22–32)
CO2: 22 mmol/L (ref 22–32)
Calcium: 7.8 mg/dL — ABNORMAL LOW (ref 8.9–10.3)
Calcium: 7.7 mg/dL — ABNORMAL LOW (ref 8.9–10.3)
Chloride: 103 mmol/L (ref 98–111)
Chloride: 101 mmol/L (ref 98–111)
Creatinine, Ser: 1.95 mg/dL — ABNORMAL HIGH (ref 0.61–1.24)
Creatinine, Ser: 1.91 mg/dL — ABNORMAL HIGH (ref 0.61–1.24)
GFR, Estimated: 41 mL/min — ABNORMAL LOW (ref >60)
GFR, Estimated: 42 mL/min — ABNORMAL LOW (ref >60)
Glucose, Bld: 146 mg/dL — ABNORMAL HIGH (ref 70–99)
Glucose, Bld: 141 mg/dL — ABNORMAL HIGH (ref 70–99)
Potassium: 3.1 mmol/L — ABNORMAL LOW (ref 3.5–5.1)
Potassium: 3.5 mmol/L (ref 3.5–5.1)
Sodium: 134 mmol/L — ABNORMAL LOW (ref 135–145)
Sodium: 134 mmol/L — ABNORMAL LOW (ref 135–145)

## 2021-05-01 LAB — PHOSPHORUS: Phosphorus: 2.4 mg/dL — ABNORMAL LOW (ref 2.5–4.6)

## 2021-05-01 LAB — COOXEMETRY PANEL
Carboxyhemoglobin: 0.6 % (ref 0.5–1.5)
Carboxyhemoglobin: 0.7 % (ref 0.5–1.5)
Methemoglobin: 1.1 % (ref 0.0–1.5)
Methemoglobin: 0.9 % (ref 0.0–1.5)
O2 Saturation: 47 %
O2 Saturation: 53.3 %
Total hemoglobin: 12.9 g/dL (ref 12.0–16.0)
Total hemoglobin: 13.1 g/dL (ref 12.0–16.0)

## 2021-05-01 LAB — MAGNESIUM: Magnesium: 1.7 mg/dL (ref 1.7–2.4)

## 2021-05-01 LAB — GLUCOSE, CAPILLARY
Glucose-Capillary: 100 mg/dL — ABNORMAL HIGH (ref 70–99)
Glucose-Capillary: 111 mg/dL — ABNORMAL HIGH (ref 70–99)
Glucose-Capillary: 114 mg/dL — ABNORMAL HIGH (ref 70–99)
Glucose-Capillary: 116 mg/dL — ABNORMAL HIGH (ref 70–99)
Glucose-Capillary: 150 mg/dL — ABNORMAL HIGH (ref 70–99)
Glucose-Capillary: 97 mg/dL (ref 70–99)

## 2021-05-01 LAB — TRIGLYCERIDES: Triglycerides: 358 mg/dL — ABNORMAL HIGH (ref <150)

## 2021-05-01 LAB — PATHOLOGIST SMEAR REVIEW

## 2021-05-01 LAB — SODIUM, URINE, RANDOM: Sodium, Ur: 82 mmol/L

## 2021-05-01 LAB — CREATININE, URINE, RANDOM: Creatinine, Urine: 114.72 mg/dL

## 2021-05-01 MED ORDER — K PHOS MONO-SOD PHOS DI & MONO 155-852-130 MG PO TABS
250.0000 mg | ORAL_TABLET | Freq: Two times a day (BID) | ORAL | Status: AC
  Administered 2021-05-01 (×2): 250 mg via ORAL
  Filled 2021-05-01 (×2): qty 1

## 2021-05-01 MED ORDER — ACETAMINOPHEN 325 MG PO TABS
650.0000 mg | ORAL_TABLET | Freq: Four times a day (QID) | ORAL | Status: DC | PRN
  Administered 2021-05-01: 650 mg via ORAL
  Filled 2021-05-01: qty 2

## 2021-05-01 MED ORDER — MILRINONE LACTATE IN DEXTROSE 20-5 MG/100ML-% IV SOLN
0.3750 ug/kg/min | INTRAVENOUS | Status: DC
Start: 2021-05-01 — End: 2021-05-05
  Administered 2021-05-01: 0.125 ug/kg/min via INTRAVENOUS
  Administered 2021-05-02: 0.25 ug/kg/min via INTRAVENOUS
  Administered 2021-05-03 – 2021-05-04 (×4): 0.375 ug/kg/min via INTRAVENOUS
  Filled 2021-05-01 (×10): qty 100

## 2021-05-01 MED ORDER — DEXMEDETOMIDINE HCL IN NACL 400 MCG/100ML IV SOLN
0.0000 ug/kg/h | INTRAVENOUS | Status: DC
  Administered 2021-05-01: 0.6 ug/kg/h via INTRAVENOUS
  Administered 2021-05-02: 0.9 ug/kg/h via INTRAVENOUS
  Administered 2021-05-02: 1.2 ug/kg/h via INTRAVENOUS
  Administered 2021-05-02: 1 ug/kg/h via INTRAVENOUS
  Administered 2021-05-02: 1.2 ug/kg/h via INTRAVENOUS
  Administered 2021-05-02: 0.9 ug/kg/h via INTRAVENOUS
  Administered 2021-05-02: 0.7 ug/kg/h via INTRAVENOUS
  Administered 2021-05-03: 0.9 ug/kg/h via INTRAVENOUS
  Administered 2021-05-03 (×2): 1 ug/kg/h via INTRAVENOUS
  Administered 2021-05-04: 0.8 ug/kg/h via INTRAVENOUS
  Administered 2021-05-04: 0.6 ug/kg/h via INTRAVENOUS
  Administered 2021-05-05: 1 ug/kg/h via INTRAVENOUS
  Filled 2021-05-01 (×15): qty 100

## 2021-05-01 MED ORDER — POTASSIUM CHLORIDE 20 MEQ PO PACK
40.0000 meq | PACK | Freq: Four times a day (QID) | ORAL | Status: AC
  Administered 2021-05-01 – 2021-05-02 (×3): 40 meq
  Filled 2021-05-01 (×3): qty 2

## 2021-05-01 MED ORDER — POLYETHYLENE GLYCOL 3350 17 G PO PACK
17.0000 g | PACK | Freq: Two times a day (BID) | ORAL | Status: DC
  Administered 2021-05-01 – 2021-05-02 (×3): 17 g via ORAL
  Filled 2021-05-01 (×3): qty 1

## 2021-05-01 MED ORDER — MAGNESIUM SULFATE 2 GM/50ML IV SOLN
2.0000 g | Freq: Once | INTRAVENOUS | Status: AC
  Administered 2021-05-01: 2 g via INTRAVENOUS
  Filled 2021-05-01: qty 50

## 2021-05-01 MED ORDER — FUROSEMIDE 10 MG/ML IJ SOLN
40.0000 mg | Freq: Three times a day (TID) | INTRAMUSCULAR | Status: DC
  Administered 2021-05-01 – 2021-05-03 (×6): 40 mg via INTRAVENOUS
  Filled 2021-05-01 (×6): qty 4

## 2021-05-01 MED ORDER — POTASSIUM CHLORIDE 10 MEQ/50ML IV SOLN
10.0000 meq | INTRAVENOUS | Status: AC
  Administered 2021-05-01 (×4): 10 meq via INTRAVENOUS
  Filled 2021-05-01 (×4): qty 50

## 2021-05-01 MED ORDER — MILRINONE LACTATE IN DEXTROSE 20-5 MG/100ML-% IV SOLN: 0.1250 ug/kg/min | INTRAVENOUS | Status: DC

## 2021-05-01 NOTE — Progress Notes (Signed)
LTM EEG discontinued - no skin breakdown at unhook.   

## 2021-05-01 NOTE — Progress Notes (Addendum)
PULMONARY / CRITICAL CARE MEDICINE   NAME:  Vincent ChangRedric Shawn Diem, MRN:  [id number], DOB:  12/18/1970, LOS: 2 ADMISSION DATE:  04/29/2021, CONSULTATION DATE:  04/29/21 REFERRING MD:  Dr. Jacqulyn BathLong, MD CHIEF COMPLAINT:  Unable to provide given intubated status  BRIEF HISTORY:    50 y/o M, admitted to ICU after PEA arrest, likely respiratory driven.  ED note reviewed.  History unobtainable for patient as he is intubated and sedated.  History per chart and sister at bedside.  Patient was not acting right, relatively unresponsive, EMS called.  Found to be agonal breathing.  Unclear initial pulse ox was.  Eventually lost pulse.  CPR was initiated.  1 dose of epi.  About 6 months CPR.  ROSC obtained.  King airway placed.  Transported to the ED.  King airway exchanged for ET tube.  Noted to be hypotensive so norepinephrine started peripherally.  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  CTA PE protocol monitor patient reveals no PE, significant bilateral airspace disease was dense consolidations in the lower lobes, significant emphysema in upper lobes with interstitial thickening felt to be most consistent with pneumonitis/infection versus volume overload.  Moderate pericardial effusion noted by the radiologist.  Other findings as below.  He was given broad-spectrum antibiotics.  Initial lactate over 10.  1 L crystalloid given.  At time of evaluation he is on fentanyl drip and propofol.  SIGNIFICANT PAST MEDICAL HISTORY   Tobacco Use Emphysema CVA DM  SIGNIFICANT EVENTS:  8/2: PEA Arrest, King airway in the field, CPR x6 minutes and epi x1, ROSC, Intubated in ED, Admitted to PCCM  8/4: Weaned off TTM  STUDIES:   CT HEAD WO CONTRAST (5MM)   Result Date: 04/29/2021 CLINICAL DATA:  Mental status change EXAM: CT HEAD WITHOUT CONTRAST TECHNIQUE: Contiguous axial images were obtained from the base of the skull through the vertex without intravenous contrast. COMPARISON:  None. FINDINGS: Brain: No acute  territorial infarction, hemorrhage or intracranial mass is visualized. Chronic right MCA infarct with extensive encephalomalacia involving the right frontal, parietal and temporal lobes as well as the right thalamus and basal ganglia. Moderate atrophy. Ex vacuo dilatation of right lateral ventricle. Atrophy of right brainstem. Vascular: No hyperdense vessels.  Carotid vascular calcification Skull: Normal. Negative for fracture or focal lesion. Sinuses/Orbits: Mucosal thickening in the sinuses. Chronic appearing deformity of the medial wall right orbit. Other: Incomplete fusion posterior arch of C1 IMPRESSION: 1. No definite CT evidence for acute intracranial abnormality. 2. Atrophy and chronic right MCA infarct. Electronically Signed   By: Jasmine PangKim  Fujinaga M.D.   On: 04/29/2021 22:09   CT Angio Chest PE W and/or Wo Contrast   Result Date: 04/29/2021 CLINICAL DATA:  PE suspected, high prob Post CPR. EXAM: CT ANGIOGRAPHY CHEST WITH CONTRAST TECHNIQUE: Multidetector CT imaging of the chest was performed using the standard protocol during bolus administration of intravenous contrast. Multiplanar CT image reconstructions and MIPs were obtained to evaluate the vascular anatomy. CONTRAST:  50mL OMNIPAQUE IOHEXOL 350 MG/ML SOLN COMPARISON:  Chest radiograph earlier today. FINDINGS: Cardiovascular: There are no filling defects within the pulmonary arteries to suggest pulmonary embolus. Mild aortic atherosclerosis. Cannot assess for dissection given phase of contrast tailored to pulmonary arteries S1. Multi chamber cardiomegaly. Minimal contrast refluxes into the hepatic veins and IVC. Moderate size circumferential pericardial effusion. This measures up to 17 mm in depth adjacent to the right ventricle. Mediastinum/Nodes: Shotty mediastinal adenopathy, including right anterior paratracheal node measuring 10 mm, series 5, image  37. Bilateral hilar lymph nodes measuring 9-10 mm. The esophagus is decompressed by enteric tube. No  visualized thyroid nodule. Lungs/Pleura: The endotracheal tube tip is at the level of the carina, recommend retraction of 2-3 cm. Dense lower lobe consolidation, left greater than right, suspicious for aspiration. There additional patchy, ground-glass and confluent airspace opacities throughout both lungs. Mild smooth septal thickening. Underlying emphysema which is partially obscured by superimposed airspace disease. There is a 2.1 x 2.6 cm nodular density posteriorly in the left upper lobe abutting the pleura, series 6, image 26, partially obscured by adjacent airspace disease. Small bilateral pleural effusions, as well as fluid tracking into the right minor fissure. No pneumothorax. Upper Abdomen: No adrenal nodule. No acute upper abdominal findings. Probable scarring in the upper left kidney. Motion obscures evaluation of the upper abdomen. Musculoskeletal: No acute osseous abnormality. No anterior rib fractures typically seen with CPR. No focal bone lesion. Review of the MIP images confirms the above findings. IMPRESSION: 1. No pulmonary embolus. 2. Multi chamber cardiomegaly with moderate circumferential pericardial effusion. Minimal contrast refluxes into the hepatic veins and IVC consistent with elevated right heart pressures. 3. Dense lower lobe consolidation, left greater than right, suspicious for aspiration. Small bilateral pleural effusions. 4. There is set the thickening in ground-glass opacities suspicious for pulmonary edema. Superimposed airspace disease within there is a ground-glass opacity may represent confluent edema or infection. 5. Shotty mediastinal and hilar adenopathy is likely reactive, but nonspecific. 6. Endotracheal tube tip is at the level of the carina, recommend retraction of 2-3 cm. 7. A 2.1 x 2.6 cm nodular density posteriorly in the left upper lobe abutting the pleura is nonspecific given the adjacent parenchymal findings, however recommend attention at follow-up to exclude the  possibility of pulmonary mass. Aortic Atherosclerosis (ICD10-I70.0) and Emphysema (ICD10-J43.9). Electronically Signed   By: Narda Rutherford M.D.   On: 04/29/2021 22:02   DG Chest Portable 1 View   Result Date: 04/29/2021 CLINICAL DATA:  Post CPR.  Cardiac arrest EXAM: PORTABLE CHEST 1 VIEW COMPARISON:  None FINDINGS: Endotracheal tube terminates 2.2 cm above carina. External pacer/defibrillator is. Midline trachea. Mild cardiomegaly. No pleural effusion or pneumothorax. Interstitial and airspace disease is relatively diffuse but greater on the left than right. IMPRESSION: Appropriate position of endotracheal tube. Cardiomegaly with left greater than right interstitial and airspace disease. Favor asymmetric pulmonary edema. Given asymmetry, aspiration is possible but felt less likely. Electronically Signed   By: Jeronimo Greaves M.D.   On: 04/29/2021 20:53   EEG adult   Result Date: 04/30/2021 Rejeana Brock, MD     04/30/2021  2:15 AM History: 50 year old male status post cardiac arrest Sedation: Propofol Technique: This is a 21 channel routine scalp EEG performed at the bedside with bipolar and monopolar montages arranged in accordance to the international 10/20 system of electrode placement. One channel was dedicated to EKG recording. Background: The background is diffusely attenuated with ventilator artifact.  There is some degree of muscle artifact throughout most of the recording, but no definite background activity is seen.  He has several episodes of "shaking" without definite EEG change, other than significant muscle artifact.  With one of these episodes muscle artifact does significantly obscure the background, but to a degree I can tell there was no significant EEG change. Photic stimulation: Physiologic driving is not performed EEG Abnormalities: Diffusely attenuated background Clinical Interpretation: This EEG is severely abnormal with diffuse attenuation of the background.  There was no  evidence of the muscle  jerking seen was epileptiform in nature.  There was no seizure or seizure predisposition recorded on this study. Please note that lack of epileptiform activity on EEG does not preclude the possibility of epilepsy. Ritta Slot, MD [facility] [phone] If 7pm- 7am, please page neurology on call as listed in AMION.   Overnight EEG with video   Result Date: 04/30/2021 Charlsie Quest, MD     04/30/2021  9:03 AM Patient Name: Homer Miller MRN: [id number] Epilepsy Attending: Charlsie Quest Referring Physician/Provider: Rutherford Guys, PA Duration: 04/30/2021 0202 to 04/30/2021 0900 Patient history: 50 year old male status post cardiac arrest. EEG to evaluate for seizure Level of alertness:  comatose AEDs during EEG study: LEV, propofol Technical aspects: This EEG study was done with scalp electrodes positioned according to the 10-20 International system of electrode placement. Electrical activity was acquired at a sampling rate of  and reviewed with a high frequency filter of  and a low frequency filter of . EEG data were recorded continuously and digitally stored. Description: EEG showed continuous generalized background suppression.  EEG was not reactive to tactile stimulation.  Event button was pressed on 04/30/2021 at 0752 for tremors in arms and chest. Concomitant EEG before, during and after the event did not show any EEG changes suggest seizure. Hyperventilation and photic stimulation were not performed.   ABNORMALITY -Background suppression, generalized IMPRESSION: This study is suggestive of profound diffuse encephalopathy, nonspecific to etiology.  However with a history of cardiac arrest this could be secondary to anoxic/hypoxic brain injury, sedation.  No seizures or epileptiform discharges were seen throughout the recording. Event button was pressed on 04/30/2021 at 0752 for tremors in arms and chest without concomitant EEG change. This was most  likely not an epileptic event. Charlsie Quest        ECHOCARDIOGRAM REPORT Patient Name:   [facility] Date of Exam: 04/30/2021  Medical Rec #:  [id number]               Height:       68.0 in  Accession #:    [id number]              Weight:       154.5 lb  Date of Birth:  06-Jun-1971                BSA:          1.832 m  Patient Age:    50 years                BP:           69/54 mmHg  Patient Gender: M                       HR:           110 bpm.  Exam Location:  Inpatient   Procedure: 2D Echo, Cardiac Doppler and Color Doppler   Indications:    Cardiac Arrest I46.9     History:        Patient has no prior history of Echocardiogram  examinations.     Sonographer:    Elmarie Shiley Dance  Referring Phys: [id number] RAHUL P DESAI      Sonographer Comments: Echo performed with patient supine and on artificial  respirator. Reading Cardiologist notified.  IMPRESSIONS     1. Minor contractile sparing of the lateral wall. Left ventricular  ejection fraction, by estimation, is <20%. The left ventricle has  severely  decreased function. The left ventricle demonstrates global hypokinesis.  Left ventricular diastolic parameters  are consistent with Grade III diastolic dysfunction (restrictive).   2. Right ventricular systolic function is normal. The right ventricular  size is normal. There is mildly elevated pulmonary artery systolic  pressure. The estimated right ventricular systolic pressure is 40.8 mmHg.   3. A small pericardial effusion is present. The pericardial effusion is  circumferential. There is no evidence of cardiac tamponade.   4. The mitral valve is normal in structure. Trivial mitral valve  regurgitation. No evidence of mitral stenosis.   5. The aortic valve is normal in structure. Aortic valve regurgitation is  not visualized. No aortic stenosis is present.   6. The inferior vena cava is dilated in size with <50% respiratory  variability, suggesting right atrial  pressure of 15 mmHg.   FINDINGS   Left Ventricle: Minor contractile sparing of the lateral wall. Left  ventricular ejection fraction, by estimation, is <20%. The left ventricle  has severely decreased function. The left ventricle demonstrates global  hypokinesis. The left ventricular  internal cavity size was normal in size. There is no left ventricular  hypertrophy. Left ventricular diastolic parameters are consistent with  Grade III diastolic dysfunction (restrictive).   Right Ventricle: The right ventricular size is normal. No increase in  right ventricular wall thickness. Right ventricular systolic function is  normal. There is mildly elevated pulmonary artery systolic pressure. The  tricuspid regurgitant velocity is 2.54   m/s, and with an assumed right atrial pressure of 15 mmHg, the estimated  right ventricular systolic pressure is 40.8 mmHg.   Left Atrium: Left atrial size was normal in size.   Right Atrium: Right atrial size was normal in size.   Pericardium: A small pericardial effusion is present. The pericardial  effusion is circumferential. There is no evidence of cardiac tamponade.   Mitral Valve: The mitral valve is normal in structure. Trivial mitral  valve regurgitation. No evidence of mitral valve stenosis.   Tricuspid Valve: The tricuspid valve is normal in structure. Tricuspid  valve regurgitation is not demonstrated. No evidence of tricuspid  stenosis.   Aortic Valve: The aortic valve is normal in structure. Aortic valve  regurgitation is not visualized. No aortic stenosis is present.   Pulmonic Valve: The pulmonic valve was normal in structure. Pulmonic valve  regurgitation is trivial. No evidence of pulmonic stenosis.   Aorta: The aortic root is normal in size and structure.   Venous: The inferior vena cava is dilated in size with less than 50%  respiratory variability, suggesting right atrial pressure of 15 mmHg.   IAS/Shunts: No atrial level shunt  detected by color flow Doppler.     Diastology  LV e' medial:   2.08 cm/s  LV E/e' medial: 28.9    RIGHT VENTRICLE            IVC  RV Basal diam:  3.30 cm    IVC diam: 2.30 cm  RV Mid diam:    2.70 cm  RV S prime:     5.06 cm/s  TAPSE (M-mode): 0.9 cm   LEFT ATRIUM             Index       RIGHT ATRIUM           Index  LA Vol (A2C):   50.8 ml 27.74 ml/m RA Area:     12.50 cm  LA Vol (A4C):   37.5 ml 20.47 ml/m  RA Volume:   31.20 ml  17.03 ml/m  LA Biplane Vol: 44.6 ml 24.35 ml/m   AORTIC VALVE  LVOT Vmax:   30.30 cm/s  LVOT Vmean:  22.250 cm/s  LVOT VTI:    0.037 m     AORTA  Ao Asc diam: 2.70 cm   MITRAL VALVE               TRICUSPID VALVE  MV Area (PHT): 3.66 cm    TR Peak grad:   25.8 mmHg  MV Decel Time: 207 msec    TR Vmax:        254.00 cm/s  MV E velocity: 60.10 cm/s  MV A velocity: 41.70 cm/s  SHUNTS  MV E/A ratio:  1.44        Systemic VTI: 0.04 m   CULTURES:  MRSA swab: Negative Resp Panel: COVID, FLU negative  ANTIBIOTICS:  Cefepime: 8/2>8/2 Vanc: 8/2>8/2 Azithromax: 8/2>>8/4 Ceftriaxone: 8/3>>  LINES/TUBES:  LIJ: 8/3 CONSULTANTS:  None SUBJECTIVE:  O/N Events: None   Patient seen at bedside this AM. Sedated and intubated.   CONSTITUTIONAL: BP 100/84 (BP Location: Right Arm)   Pulse (!) 115   Temp 99.1 F (37.3 C) (Esophageal)   Resp (!) 24   Ht 5\' 8"  (1.727 m)   Wt 75.1 kg   SpO2 (!) 10%   BMI 25.17 kg/m   I/O last 3 completed shifts: In: 8481.8 [I.V.:3742.6; NG/GT:864.3; IV Piggyback:3874.9] Out: 925 [Urine:925]     Vent Mode: PRVC FiO2 (%):  [40 %-80 %] 40 % Set Rate:  [24 bmp] 24 bmp Vt Set:  [550 mL] 550 mL PEEP:  [5 cmH20-8 cmH20] 5 cmH20 Plateau Pressure:  [24 cmH20-25 cmH20] 25 cmH20  PHYSICAL EXAM: Physical Exam Constitutional:      Comments: Intubated and sedated  Eyes:     Comments: Pinpoint pupils, reactive to light.   Cardiovascular:     Rate and Rhythm: Regular rhythm. Tachycardia present.     Pulses:  Normal pulses.     Heart sounds: No murmur heard. Pulmonary:     Breath sounds: Normal breath sounds. No wheezing or rales.  Musculoskeletal:     Right lower leg: No edema.     Left lower leg: No edema.  Skin:    Comments: LIJ intact, no signs of purulence, drainage, or bleeding at site.   Neurological:     Comments: Doll's eyes intact, corneal reflex non-reactive bilaterally    RESOLVED PROBLEM LIST   ASSESSMENT AND PLAN    PEA Cardiac Arrest Toxic Metabolic Encephalopathy: Likely respiratory driven given pneumonia findings on imaging. Completed TTM protocol. Requiring Demerol Q2H PRN for shivering on 8/3. EEG negative for seizures. Troponin peaked at 324 last 218. TTE Showed Grade III diastolic HF with EF of <20%. Will wean off propofol and fentanyl and assess neurological function. If no improvement, may obtain MRI head 8/5.  - Hold Fentanyl and Propofol  - Reassess neurological function   Septic Shock in the Setting of Pneumonia: Leukocytosis has resolved. Prolonged Qtc today, will DC azithromycin and continue monotherapy of ceftriaxone. Will continue antibiotics, day 3/7.  - Ceftriaxone 2g IV QD - DC Azithromycin   AKI:  Continued worsening kidney function sCR 1.95<1.5, BUN/Cr ratio of 13. No significant urinary output. (700 mL output yesterday, + 7.5 L this admission). BUN/Cr suggesting postrenal cause, will obtain renal 5/7 and urine studies although likely this is in the setting of hypoperfusion from his PEA arrest. Replete Electrolytes as needed.  -  Avoid Nephrotoxic agents - Trend BMP - Trend Mg - Trend Phos - Renal U/S - Urine Cr, Urine   Heart Failure with Reduced Ejection Fraction (<20%):  TTE demonstrates left ventricle global hypokinesis with reduced EF <20%. Currently on levophed and intubated, pending neurological function and GOC discussions will consider GDMT if he stabilizes.  Pericardial Effusion:  Minimal circumferential effusion noted on TTE, not  concerning for tamponade.     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  - SSI  Lung Mass: 2.5 cm, F/U with outpatient CT scan.  SUMMARY OF TODAY'S PLAN:  Holding sedatives and reassessing for neurological function. Narrowing antibiotics.   Best Practice / Goals of Care / Disposition.   Diet: tube feeds  Pain/Anxiety/Delirium protocol (if indicated): Versed, fentanyl VAP protocol (if indicated): ordered DVT prophylaxis: SCDs GI prophylaxis: PPI Glucose control: SSI Mobility: bed bound Code Status: FULL Disposition: Pending medical management Family Discussion: Updated [name] (Sister) via telephone 8/4.   LABS  Glucose Recent Labs  Lab 04/30/21 0739 04/30/21 1119 04/30/21 1518 04/30/21 1918 04/30/21 2307 05/01/21 0314  GLUCAP 120* 128* 106* 107* 125* 116*    BMET Recent Labs  Lab 04/30/21 1006 04/30/21 2022 05/01/21 0300  NA 138 132* 134*  K 5.3* 3.5 3.1*  CL 111 101 103  CO2 14* 17* 19*  BUN 21* 26* 27*  CREATININE 1.80* 1.98* 1.95*  GLUCOSE 99 127* 146*    Liver Enzymes Recent Labs  Lab 04/29/21 2040  AST 34  ALT 31  ALKPHOS 49  BILITOT 0.9  ALBUMIN 3.0*    Electrolytes Recent Labs  Lab 04/29/21 2040 04/30/21 0022 04/30/21 1006 04/30/21 2022 05/01/21 0300  CALCIUM 7.7* 7.9* 7.3* 7.7* 7.8*  MG 2.1 2.1  --   --  1.7  PHOS 7.0* 5.3*  --   --  2.4*    CBC Recent Labs  Lab 04/29/21 2040 04/29/21 2052 04/30/21 0022 04/30/21 0405 04/30/21 0815 05/01/21 0300  WBC 17.6*  --  20.1*  --   --  9.4  HGB 14.1   < > 14.7 13.9 13.9 12.3*  HCT 46.4   < > 47.8 41.0 41.0 37.3*  PLT 440*  --  463*  --   --  339   < > = values in this interval not displayed.    ABG Recent Labs  Lab 04/29/21 2124 04/30/21 0405 04/30/21 0815  PHART 7.183* 7.301* 7.375  PCO2ART 37.1 33.2 25.2*  PO2ART 90 63* 85    Coag's Recent Labs  Lab 04/30/21 0022  APTT 33  INR 1.2    Sepsis Markers Recent Labs  Lab 04/29/21 2041 04/30/21 0022   LATICACIDVEN 10.4* 7.1*    Cardiac Enzymes No results for input(s): TROPONINI, PROBNP in the last 168 hours.  PAST MEDICAL HISTORY :   He  has no past medical history on file.  PAST SURGICAL HISTORY:  He  has no past surgical history on file.  No Known Allergies  No current facility-administered medications on file prior to encounter.   Current Outpatient Medications on File Prior to Encounter  Medication Sig   amLODipine (NORVASC) 5 MG tablet Take 5 mg by mouth daily.   atorvastatin (LIPITOR) 40 MG tablet Take 40 mg by mouth at bedtime.    FAMILY HISTORY:   His family history is not on file.  SOCIAL HISTORY:  He    REVIEW OF SYSTEMS:    Unable to obtain

## 2021-05-01 NOTE — Progress Notes (Signed)
eLink Physician-Brief Progress Note Patient Name: Vincent [name] DOB: 03-Jun-1971 MRN: [id number]   Date of Service  05/01/2021  HPI/Events of Note  Hypokalemia  Hypomagnesemia - K+ = 3.1, Mg++ = 1.7 and Creatinine - 1.95. Patient being rewarmed, however, only has 1 hour left.  eICU Interventions  Will replace K+ and Mg++.     Intervention Category Major Interventions: Electrolyte abnormality - evaluation and management  [name] 05/01/2021, 4:42 AM

## 2021-05-01 NOTE — Procedures (Addendum)
Patient Name: Vincent [name]  MRN: [id number]  Epilepsy Attending: Charlsie Quest  Referring Physician/Provider: Rutherford Guys, PA Duration: 05/01/2021 0202 to 05/01/2021 1050   Patient history: 50 year old male status post cardiac arrest. EEG to evaluate for seizure   Level of alertness:  comatose   AEDs during EEG study: LEV, propofol   Technical aspects: This EEG study was done with scalp electrodes positioned according to the 10-20 International system of electrode placement. Electrical activity was acquired at a sampling rate of 500Hz  and reviewed with a high frequency filter of 70Hz  and a low frequency filter of 1Hz . EEG data were recorded continuously and digitally stored.   Description: EEG showed near continuous 3 to 5 Hz theta-delta slowing. Hyperventilation and photic stimulation were not performed.      ABNORMALITY -Continuous slow, generalized   IMPRESSION: This study is suggestive of severe diffuse encephalopathy, nonspecific to etiology.  However with a history of cardiac arrest this could be secondary to anoxic/hypoxic brain injury, sedation.  No seizures were seen throughout the recording.  EEG appears to be improving compared to previous day.    Vincent [name] 

## 2021-05-02 DIAGNOSIS — I469 Cardiac arrest, cause unspecified: Secondary | ICD-10-CM | POA: Diagnosis not present

## 2021-05-02 LAB — GLUCOSE, CAPILLARY
Glucose-Capillary: 118 mg/dL — ABNORMAL HIGH (ref 70–99)
Glucose-Capillary: 139 mg/dL — ABNORMAL HIGH (ref 70–99)
Glucose-Capillary: 141 mg/dL — ABNORMAL HIGH (ref 70–99)
Glucose-Capillary: 146 mg/dL — ABNORMAL HIGH (ref 70–99)
Glucose-Capillary: 148 mg/dL — ABNORMAL HIGH (ref 70–99)
Glucose-Capillary: 192 mg/dL — ABNORMAL HIGH (ref 70–99)

## 2021-05-02 LAB — COOXEMETRY PANEL
Carboxyhemoglobin: 0.9 % (ref 0.5–1.5)
Carboxyhemoglobin: 0.6 % (ref 0.5–1.5)
Methemoglobin: 1 % (ref 0.0–1.5)
Methemoglobin: 0.8 % (ref 0.0–1.5)
O2 Saturation: 52 %
O2 Saturation: 44.2 %
Total hemoglobin: 12.2 g/dL (ref 12.0–16.0)
Total hemoglobin: 12.3 g/dL (ref 12.0–16.0)

## 2021-05-02 LAB — CBC
HCT: 36 % — ABNORMAL LOW (ref 39.0–52.0)
Hemoglobin: 11.7 g/dL — ABNORMAL LOW (ref 13.0–17.0)
MCH: 28.1 pg (ref 26.0–34.0)
MCHC: 32.5 g/dL (ref 30.0–36.0)
MCV: 86.5 fL (ref 80.0–100.0)
Platelets: 319 10*3/uL (ref 150–400)
RBC: 4.16 MIL/uL — ABNORMAL LOW (ref 4.22–5.81)
RDW: 14 % (ref 11.5–15.5)
WBC: 10.1 10*3/uL (ref 4.0–10.5)
nRBC: 0 % (ref 0.0–0.2)

## 2021-05-02 LAB — POCT I-STAT 7, (LYTES, BLD GAS, ICA,H+H)
Acid-Base Excess: 0 mmol/L (ref 0.0–2.0)
Acid-Base Excess: 1 mmol/L (ref 0.0–2.0)
Bicarbonate: 23.2 mmol/L (ref 20.0–28.0)
Bicarbonate: 23.5 mmol/L (ref 20.0–28.0)
Calcium, Ion: 1.09 mmol/L — ABNORMAL LOW (ref 1.15–1.40)
Calcium, Ion: 1.1 mmol/L — ABNORMAL LOW (ref 1.15–1.40)
HCT: 33 % — ABNORMAL LOW (ref 39.0–52.0)
HCT: 32 % — ABNORMAL LOW (ref 39.0–52.0)
Hemoglobin: 11.2 g/dL — ABNORMAL LOW (ref 13.0–17.0)
Hemoglobin: 10.9 g/dL — ABNORMAL LOW (ref 13.0–17.0)
O2 Saturation: 96 %
O2 Saturation: 98 %
Patient temperature: 97.6
Patient temperature: 36.9
Potassium: 4.1 mmol/L (ref 3.5–5.1)
Potassium: 4.4 mmol/L (ref 3.5–5.1)
Sodium: 139 mmol/L (ref 135–145)
Sodium: 137 mmol/L (ref 135–145)
TCO2: 24 mmol/L (ref 22–32)
TCO2: 24 mmol/L (ref 22–32)
pCO2 arterial: 31.8 mmHg — ABNORMAL LOW (ref 32.0–48.0)
pCO2 arterial: 30.6 mmHg — ABNORMAL LOW (ref 32.0–48.0)
pH, Arterial: 7.47 — ABNORMAL HIGH (ref 7.350–7.450)
pH, Arterial: 7.494 — ABNORMAL HIGH (ref 7.350–7.450)
pO2, Arterial: 71 mmHg — ABNORMAL LOW (ref 83.0–108.0)
pO2, Arterial: 95 mmHg (ref 83.0–108.0)

## 2021-05-02 LAB — BASIC METABOLIC PANEL
Anion gap: 11 (ref 5–15)
Anion gap: 10 (ref 5–15)
BUN: 34 mg/dL — ABNORMAL HIGH (ref 6–20)
BUN: 47 mg/dL — ABNORMAL HIGH (ref 6–20)
CO2: 24 mmol/L (ref 22–32)
CO2: 25 mmol/L (ref 22–32)
Calcium: 8 mg/dL — ABNORMAL LOW (ref 8.9–10.3)
Calcium: 8.3 mg/dL — ABNORMAL LOW (ref 8.9–10.3)
Chloride: 102 mmol/L (ref 98–111)
Chloride: 103 mmol/L (ref 98–111)
Creatinine, Ser: 2.22 mg/dL — ABNORMAL HIGH (ref 0.61–1.24)
Creatinine, Ser: 2.49 mg/dL — ABNORMAL HIGH (ref 0.61–1.24)
GFR, Estimated: 35 mL/min — ABNORMAL LOW (ref >60)
GFR, Estimated: 30 mL/min — ABNORMAL LOW (ref >60)
Glucose, Bld: 144 mg/dL — ABNORMAL HIGH (ref 70–99)
Glucose, Bld: 169 mg/dL — ABNORMAL HIGH (ref 70–99)
Potassium: 4.3 mmol/L (ref 3.5–5.1)
Potassium: 4.6 mmol/L (ref 3.5–5.1)
Sodium: 137 mmol/L (ref 135–145)
Sodium: 138 mmol/L (ref 135–145)

## 2021-05-02 LAB — PHOSPHORUS: Phosphorus: 3.6 mg/dL (ref 2.5–4.6)

## 2021-05-02 LAB — MAGNESIUM: Magnesium: 2.8 mg/dL — ABNORMAL HIGH (ref 1.7–2.4)

## 2021-05-02 MED ORDER — POLYETHYLENE GLYCOL 3350 17 G PO PACK
17.0000 g | PACK | Freq: Two times a day (BID) | ORAL | Status: DC
  Administered 2021-05-02 – 2021-05-09 (×14): 17 g
  Filled 2021-05-02 (×14): qty 1

## 2021-05-02 MED ORDER — MILRINONE LACTATE IN DEXTROSE 20-5 MG/100ML-% IV SOLN: 0.3750 ug/kg/min | INTRAVENOUS | Status: DC

## 2021-05-02 MED ORDER — ACETAMINOPHEN 325 MG PO TABS
650.0000 mg | ORAL_TABLET | Freq: Four times a day (QID) | ORAL | Status: DC | PRN
  Administered 2021-05-03 – 2021-05-14 (×9): 650 mg
  Filled 2021-05-02 (×9): qty 2

## 2021-05-02 NOTE — Progress Notes (Signed)
PULMONARY / CRITICAL CARE MEDICINE   NAME:  Vincent [name], MRN:  [id number], DOB:  [birth year], LOS: 3 ADMISSION DATE:  04/29/2021, CONSULTATION DATE:  04/29/21 REFERRING MD:  Dr. Jacqulyn Bath, MD CHIEF COMPLAINT:  Unable to provide given intubated status  BRIEF HISTORY:    50 y/o M, admitted to ICU after PEA arrest, likely respiratory driven.  ED note reviewed.  History unobtainable for patient as he is intubated and sedated.  History per chart and sister at bedside.  Patient was not acting right, relatively unresponsive, EMS called.  Found to be agonal breathing.  Unclear initial pulse ox was.  Eventually lost pulse.  CPR was initiated.  1 dose of epi.  About 6 months CPR.  ROSC obtained.  King airway placed.  Transported to the ED.  King airway exchanged for ET tube.  Noted to be hypotensive so norepinephrine started peripherally.  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  CTA PE protocol monitor patient reveals no PE, significant bilateral airspace disease was dense consolidations in the lower lobes, significant emphysema in upper lobes with interstitial thickening felt to be most consistent with pneumonitis/infection versus volume overload.  Moderate pericardial effusion noted by the radiologist.  Other findings as below.  He was given broad-spectrum antibiotics.  Initial lactate over 10.  1 L crystalloid given.  At time of evaluation he is on fentanyl drip and propofol.  SIGNIFICANT PAST MEDICAL HISTORY   Tobacco Use Emphysema CVA DM  SIGNIFICANT EVENTS:  8/2: PEA Arrest, King airway in the field, CPR x6 minutes and epi x1, ROSC, Intubated in ED, Admitted to PCCM  8/4: Weaned off TTM, Coox O2 sat of 47%, started on milrinone and lasix  STUDIES:   CT HEAD WO CONTRAST ( )   Result Date: 04/29/2021 CLINICAL DATA:  Mental status change EXAM: CT HEAD WITHOUT CONTRAST TECHNIQUE: Contiguous axial images were obtained from the base of the skull through the vertex without intravenous contrast.  COMPARISON:  None. FINDINGS: Brain: No acute territorial infarction, hemorrhage or intracranial mass is visualized. Chronic right MCA infarct with extensive encephalomalacia involving the right frontal, parietal and temporal lobes as well as the right thalamus and basal ganglia. Moderate atrophy. Ex vacuo dilatation of right lateral ventricle. Atrophy of right brainstem. Vascular: No hyperdense vessels.  Carotid vascular calcification Skull: Normal. Negative for fracture or focal lesion. Sinuses/Orbits: Mucosal thickening in the sinuses. Chronic appearing deformity of the medial wall right orbit. Other: Incomplete fusion posterior arch of C1 IMPRESSION: 1. No definite CT evidence for acute intracranial abnormality. 2. Atrophy and chronic right MCA infarct. Electronically Signed   By: Jasmine Pang M.D.   On: 04/29/2021 22:09   CT Angio Chest PE W and/or Wo Contrast   Result Date: 04/29/2021 CLINICAL DATA:  PE suspected, high prob Post CPR. EXAM: CT ANGIOGRAPHY CHEST WITH CONTRAST TECHNIQUE: Multidetector CT imaging of the chest was performed using the standard protocol during bolus administration of intravenous contrast. Multiplanar CT image reconstructions and MIPs were obtained to evaluate the vascular anatomy. CONTRAST:  39mL OMNIPAQUE IOHEXOL 350 MG/ML SOLN COMPARISON:  Chest radiograph earlier today. FINDINGS: Cardiovascular: There are no filling defects within the pulmonary arteries to suggest pulmonary embolus. Mild aortic atherosclerosis. Cannot assess for dissection given phase of contrast tailored to pulmonary arteries S1. Multi chamber cardiomegaly. Minimal contrast refluxes into the hepatic veins and IVC. Moderate size circumferential pericardial effusion. This measures up to 17 mm in depth adjacent to the right ventricle. Mediastinum/Nodes: Shotty mediastinal adenopathy, including  right anterior paratracheal node measuring 10 mm, series 5, image 37. Bilateral hilar lymph nodes measuring 9-10 mm. The  esophagus is decompressed by enteric tube. No visualized thyroid nodule. Lungs/Pleura: The endotracheal tube tip is at the level of the carina, recommend retraction of 2-3 cm. Dense lower lobe consolidation, left greater than right, suspicious for aspiration. There additional patchy, ground-glass and confluent airspace opacities throughout both lungs. Mild smooth septal thickening. Underlying emphysema which is partially obscured by superimposed airspace disease. There is a 2.1 x 2.6 cm nodular density posteriorly in the left upper lobe abutting the pleura, series 6, image 26, partially obscured by adjacent airspace disease. Small bilateral pleural effusions, as well as fluid tracking into the right minor fissure. No pneumothorax. Upper Abdomen: No adrenal nodule. No acute upper abdominal findings. Probable scarring in the upper left kidney. Motion obscures evaluation of the upper abdomen. Musculoskeletal: No acute osseous abnormality. No anterior rib fractures typically seen with CPR. No focal bone lesion. Review of the MIP images confirms the above findings. IMPRESSION: 1. No pulmonary embolus. 2. Multi chamber cardiomegaly with moderate circumferential pericardial effusion. Minimal contrast refluxes into the hepatic veins and IVC consistent with elevated right heart pressures. 3. Dense lower lobe consolidation, left greater than right, suspicious for aspiration. Small bilateral pleural effusions. 4. There is set the thickening in ground-glass opacities suspicious for pulmonary edema. Superimposed airspace disease within there is a ground-glass opacity may represent confluent edema or infection. 5. Shotty mediastinal and hilar adenopathy is likely reactive, but nonspecific. 6. Endotracheal tube tip is at the level of the carina, recommend retraction of 2-3 cm. 7. A 2.1 x 2.6 cm nodular density posteriorly in the left upper lobe abutting the pleura is nonspecific given the adjacent parenchymal findings, however  recommend attention at follow-up to exclude the possibility of pulmonary mass. Aortic Atherosclerosis (ICD10-I70.0) and Emphysema (ICD10-J43.9). Electronically Signed   By: Narda RutherfordMelanie  Sanford M.D.   On: 04/29/2021 22:02   DG Chest Portable 1 View   Result Date: 04/29/2021 CLINICAL DATA:  Post CPR.  Cardiac arrest EXAM: PORTABLE CHEST 1 VIEW COMPARISON:  None FINDINGS: Endotracheal tube terminates 2.2 cm above carina. External pacer/defibrillator is. Midline trachea. Mild cardiomegaly. No pleural effusion or pneumothorax. Interstitial and airspace disease is relatively diffuse but greater on the left than right. IMPRESSION: Appropriate position of endotracheal tube. Cardiomegaly with left greater than right interstitial and airspace disease. Favor asymmetric pulmonary edema. Given asymmetry, aspiration is possible but felt less likely. Electronically Signed   By: Jeronimo GreavesKyle  Talbot M.D.   On: 04/29/2021 20:53   EEG adult   Result Date: 04/30/2021 Rejeana BrockKirkpatrick, McNeill P, MD     04/30/2021  2:15 AM History: [age] year old male status post cardiac arrest Sedation: Propofol Technique: This is a 21 channel routine scalp EEG performed at the bedside with bipolar and monopolar montages arranged in accordance to the international 10/20 system of electrode placement. One channel was dedicated to EKG recording. Background: The background is diffusely attenuated with ventilator artifact.  There is some degree of muscle artifact throughout most of the recording, but no definite background activity is seen.  He has several episodes of "shaking" without definite EEG change, other than significant muscle artifact.  With one of these episodes muscle artifact does significantly obscure the background, but to a degree I can tell there was no significant EEG change. Photic stimulation: Physiologic driving is not performed EEG Abnormalities: Diffusely attenuated background Clinical Interpretation: This EEG is severely abnormal with diffuse  attenuation of  the background.  There was no evidence of the muscle jerking seen was epileptiform in nature.  There was no seizure or seizure predisposition recorded on this study. Please note that lack of epileptiform activity on EEG does not preclude the possibility of epilepsy. Ritta Slot, MD [facility] [phone] If 7pm- 7am, please page neurology on call as listed in AMION.   Overnight EEG with video   Result Date: 04/30/2021 Charlsie Quest, MD     04/30/2021  9:03 AM Patient Name: Kahli Fitzgerald MRN: [id number] Epilepsy Attending: Charlsie Quest Referring Physician/Provider: Rutherford Guys, PA Duration: 04/30/2021 0202 to 04/30/2021 0900 Patient history: 50 year old male status post cardiac arrest. EEG to evaluate for seizure Level of alertness:  comatose AEDs during EEG study: LEV, propofol Technical aspects: This EEG study was done with scalp electrodes positioned according to the 10-20 International system of electrode placement. Electrical activity was acquired at a sampling rate of  and reviewed with a high frequency filter of  and a low frequency filter of . EEG data were recorded continuously and digitally stored. Description: EEG showed continuous generalized background suppression.  EEG was not reactive to tactile stimulation.  Event button was pressed on 04/30/2021 at 0752 for tremors in arms and chest. Concomitant EEG before, during and after the event did not show any EEG changes suggest seizure. Hyperventilation and photic stimulation were not performed.   ABNORMALITY -Background suppression, generalized IMPRESSION: This study is suggestive of profound diffuse encephalopathy, nonspecific to etiology.  However with a history of cardiac arrest this could be secondary to anoxic/hypoxic brain injury, sedation.  No seizures or epileptiform discharges were seen throughout the recording. Event button was pressed on 04/30/2021 at 0752 for tremors in arms and chest  without concomitant EEG change. This was most likely not an epileptic event. Charlsie Quest        ECHOCARDIOGRAM REPORT Patient Name:   [facility] Date of Exam: 04/30/2021  Medical Rec #:  [id number]               Height:       68.0 in  Accession #:    [id number]              Weight:       154.5 lb  Date of Birth:  1971-06-03                BSA:          1.832 m  Patient Age:    50 years                BP:           69/54 mmHg  Patient Gender: M                       HR:           110 bpm.  Exam Location:  Inpatient   Procedure: 2D Echo, Cardiac Doppler and Color Doppler   Indications:    Cardiac Arrest I46.9     History:        Patient has no prior history of Echocardiogram  examinations.     Sonographer:    Elmarie Shiley Dance  Referring Phys: [id number] RAHUL P DESAI      Sonographer Comments: Echo performed with patient supine and on artificial  respirator. Reading Cardiologist notified.  IMPRESSIONS     1. Minor contractile sparing of the lateral wall. Left ventricular  ejection fraction, by estimation, is <20%. The left ventricle has severely  decreased function. The left ventricle demonstrates global hypokinesis.  Left ventricular diastolic parameters  are consistent with Grade III diastolic dysfunction (restrictive).   2. Right ventricular systolic function is normal. The right ventricular  size is normal. There is mildly elevated pulmonary artery systolic  pressure. The estimated right ventricular systolic pressure is 40.8 mmHg.   3. A small pericardial effusion is present. The pericardial effusion is  circumferential. There is no evidence of cardiac tamponade.   4. The mitral valve is normal in structure. Trivial mitral valve  regurgitation. No evidence of mitral stenosis.   5. The aortic valve is normal in structure. Aortic valve regurgitation is  not visualized. No aortic stenosis is present.   6. The inferior vena cava is dilated in size with <50% respiratory   variability, suggesting right atrial pressure of 15 mmHg.   FINDINGS   Left Ventricle: Minor contractile sparing of the lateral wall. Left  ventricular ejection fraction, by estimation, is <20%. The left ventricle  has severely decreased function. The left ventricle demonstrates global  hypokinesis. The left ventricular  internal cavity size was normal in size. There is no left ventricular  hypertrophy. Left ventricular diastolic parameters are consistent with  Grade III diastolic dysfunction (restrictive).   Right Ventricle: The right ventricular size is normal. No increase in  right ventricular wall thickness. Right ventricular systolic function is  normal. There is mildly elevated pulmonary artery systolic pressure. The  tricuspid regurgitant velocity is 2.54   m/s, and with an assumed right atrial pressure of 15 mmHg, the estimated  right ventricular systolic pressure is 40.8 mmHg.   Left Atrium: Left atrial size was normal in size.   Right Atrium: Right atrial size was normal in size.   Pericardium: A small pericardial effusion is present. The pericardial  effusion is circumferential. There is no evidence of cardiac tamponade.   Mitral Valve: The mitral valve is normal in structure. Trivial mitral  valve regurgitation. No evidence of mitral valve stenosis.   Tricuspid Valve: The tricuspid valve is normal in structure. Tricuspid  valve regurgitation is not demonstrated. No evidence of tricuspid  stenosis.   Aortic Valve: The aortic valve is normal in structure. Aortic valve  regurgitation is not visualized. No aortic stenosis is present.   Pulmonic Valve: The pulmonic valve was normal in structure. Pulmonic valve  regurgitation is trivial. No evidence of pulmonic stenosis.   Aorta: The aortic root is normal in size and structure.   Venous: The inferior vena cava is dilated in size with less than 50%  respiratory variability, suggesting right atrial pressure of 15 mmHg.    IAS/Shunts: No atrial level shunt detected by color flow Doppler.     Diastology  LV e' medial:   2.08 cm/s  LV E/e' medial: 28.9    RIGHT VENTRICLE            IVC  RV Basal diam:  3.30 cm    IVC diam: 2.30 cm  RV Mid diam:    2.70 cm  RV S prime:     5.06 cm/s  TAPSE (M-mode): 0.9 cm   LEFT ATRIUM             Index       RIGHT ATRIUM           Index  LA Vol (A2C):   50.8 ml 27.74 ml/m RA Area:     12.50 cm  LA Vol (A4C):   37.5 ml 20.47 ml/m RA Volume:   31.20 ml  17.03 ml/m  LA Biplane Vol: 44.6 ml 24.35 ml/m   AORTIC VALVE  LVOT Vmax:   30.30 cm/s  LVOT Vmean:  22.250 cm/s  LVOT VTI:    0.037 m     AORTA  Ao Asc diam: 2.70 cm   MITRAL VALVE               TRICUSPID VALVE  MV Area (PHT): 3.66 cm    TR Peak grad:   25.8 mmHg  MV Decel Time: 207 msec    TR Vmax:        254.00 cm/s  MV E velocity: 60.10 cm/s  MV A velocity: 41.70 cm/s  SHUNTS  MV E/A ratio:  1.44        Systemic VTI: 0.04 m   CULTURES:  MRSA swab: Negative Resp Panel: COVID, FLU negative  ANTIBIOTICS:  Cefepime: 8/2>8/2 Vanc: 8/2>8/2 Azithromax: 8/2>>8/4 Ceftriaxone: 8/3>>>  LINES/TUBES:  LIJ: 8/3 CONSULTANTS:  None SUBJECTIVE:  O/N Events: Biting on tube, non purposeful arm movements. Opening eyes sporadically.    Intubated.    CONSTITUTIONAL: BP 97/77   Pulse 89   Temp 97.7 F (36.5 C) (Esophageal)   Resp (!) 24   Ht 5\' 8"  (1.727 m)   Wt 78.1 kg   SpO2 100%   BMI 26.18 kg/m   I/O last 3 completed shifts: In: 4922.9 [I.V.:1998.6; NG/GT:2300; IV Piggyback:624.3] Out: 1725 [Urine:1725]     Vent Mode: PRVC FiO2 (%):  [50 %] 50 % Set Rate:  [24 bmp] 24 bmp Vt Set:  [550 mL] 550 mL PEEP:  [5 cmH20] 5 cmH20 Plateau Pressure:  [23 cmH20-42 cmH20] 31 cmH20  PHYSICAL EXAM:  Physical Exam Constitutional:      Appearance: He is ill-appearing. He is not diaphoretic.  Eyes:     Comments: Pinpoint pupils, non-reactive.   Cardiovascular:     Rate and Rhythm: Normal rate and  regular rhythm.     Pulses: Normal pulses.     Heart sounds: Normal heart sounds. No murmur heard.   No friction rub. No gallop.  Pulmonary:     Breath sounds: No wheezing, rhonchi or rales.     Comments: Intubated Abdominal:     General: Bowel sounds are normal.     Palpations: Abdomen is soft.  Neurological:     Comments: Corneal reflex intact bilaterally, no doll's eyes reflex.      ASSESSMENT AND PLAN    PEA Cardiac Arrest Toxic Metabolic Encephalopathy: Likely respiratory driven given pneumonia findings on imaging. Completed TTM protocol. Requiring Demerol Q2H PRN for shivering on 8/3. EEG negative for seizures. Troponin peaked at 324 last 218. TTE Showed Grade III diastolic HF with EF of <20%. EEGs showing no seizure activity.  ON having some spontaneous, non-purposeful movement. Opening his eyes sporadically later this AM. Will proceed with MRI today, may need palliative consult if it shows anoxic brain injury.  - MRI Brain   Heart Failure with Reduced Ejection Fraction (<20%):  TTE demonstrates left ventricle global hypokinesis with reduced EF <20%. Coox panel showed an O2 saturation of 47%, he was started on milrinone with improvement to 52%, Will increase milrinone today, additionally increasing PEEP from 5 to 8 to assess if this aides in his HF. Appears euvolemic on examination today.  - Increase PEEP to 8 from 5, will check ABG at 1100 - Keep K >4, Mg >2 - increase  Milrinone 0.25 mcg/kg/hr - afternoon Coox panel.  - Lasix 40 mg BID.    Aspiration Pneumonia: Leukocytosis has resolved. Prolonged Qtc today, will DC azithromycin and continue monotherapy of ceftriaxone. Will continue antibiotics, day 4/7.  - Ceftriaxone 2g IV QD  AKI:  U/S does not demonstrate postrenal cause, Urine studies of 1% indicate intrinsic nature, likely in the setting of hypoperfustion. Kidney function continues to worsen, put out 1.4L yesterday after starting Lasix. - Avoid Nephrotoxic agents -  Trend BMP  Pericardial Effusion:  Minimal circumferential effusion noted on TTE, not concerning for tamponade.     DMTII: - SSI  Lung Mass: 2.5 cm, F/U with outpatient CT scan.  SUMMARY OF TODAY'S PLAN:  Increase milrinone, MRI brain    Best Practice / Goals of Care / Disposition.   Diet: tube feeds  VAP protocol (if indicated): ordered DVT prophylaxis: SCDs GI prophylaxis: PPI Glucose control: SSI Mobility: bed bound Code Status: FULL Disposition: Pending medical management LABS  Glucose Recent Labs  Lab 05/01/21 0728 05/01/21 1115 05/01/21 1513 05/01/21 1920 05/01/21 2330 05/02/21 0308  GLUCAP 100* 111* 114* 97 150* 148*     BMET Recent Labs  Lab 05/01/21 0300 05/01/21 1420 05/02/21 0342  NA 134* 134* 137  K 3.1* 3.5 4.3  CL 103 101 102  CO2 19* 22 24  BUN 27* 28* 34*  CREATININE 1.95* 1.91* 2.22*  GLUCOSE 146* 141* 144*     Liver Enzymes Recent Labs  Lab 04/29/21 2040  AST 34  ALT 31  ALKPHOS 49  BILITOT 0.9  ALBUMIN 3.0*     Electrolytes Recent Labs  Lab 04/30/21 0022 04/30/21 1006 05/01/21 0300 05/01/21 1420 05/02/21 0342  CALCIUM 7.9*   < > 7.8* 7.7* 8.0*  MG 2.1  --  1.7  --  2.8*  PHOS 5.3*  --  2.4*  --  3.6   < > = values in this interval not displayed.     CBC Recent Labs  Lab 04/30/21 0022 04/30/21 0405 04/30/21 0815 05/01/21 0300 05/02/21 0342  WBC 20.1*  --   --  9.4 10.1  HGB 14.7   < > 13.9 12.3* 11.7*  HCT 47.8   < > 41.0 37.3* 36.0*  PLT 463*  --   --  339 319   < > = values in this interval not displayed.     ABG Recent Labs  Lab 04/29/21 2124 04/30/21 0405 04/30/21 0815  PHART 7.183* 7.301* 7.375  PCO2ART 37.1 33.2 25.2*  PO2ART 90 63* 85     Coag's Recent Labs  Lab 04/30/21 0022  APTT 33  INR 1.2     Sepsis Markers Recent Labs  Lab 04/29/21 2041 04/30/21 0022  LATICACIDVEN 10.4* 7.1*     Cardiac Enzymes No results for input(s): TROPONINI, PROBNP in the last 168  hours.  PAST MEDICAL HISTORY :   He  has no past medical history on file.  PAST SURGICAL HISTORY:  He  has no past surgical history on file.  No Known Allergies  No current facility-administered medications on file prior to encounter.   Current Outpatient Medications on File Prior to Encounter  Medication Sig   amLODipine (NORVASC) 5 MG tablet Take 5 mg by mouth daily.   atorvastatin (LIPITOR) 40 MG tablet Take 40 mg by mouth at bedtime.    FAMILY HISTORY:   His family history is not on file.  SOCIAL HISTORY:  He    REVIEW OF SYSTEMS:  Unable to obtain

## 2021-05-03 ENCOUNTER — Inpatient Hospital Stay (HOSPITAL_COMMUNITY): Payer: Medicaid Other

## 2021-05-03 DIAGNOSIS — J9601 Acute respiratory failure with hypoxia: Secondary | ICD-10-CM | POA: Diagnosis not present

## 2021-05-03 LAB — COOXEMETRY PANEL
Carboxyhemoglobin: 0.9 % (ref 0.5–1.5)
Methemoglobin: 0.5 % (ref 0.0–1.5)
O2 Saturation: 54.1 %
Total hemoglobin: 9.7 g/dL — ABNORMAL LOW (ref 12.0–16.0)

## 2021-05-03 LAB — BASIC METABOLIC PANEL
Anion gap: 12 (ref 5–15)
BUN: 57 mg/dL — ABNORMAL HIGH (ref 6–20)
CO2: 24 mmol/L (ref 22–32)
Calcium: 8.6 mg/dL — ABNORMAL LOW (ref 8.9–10.3)
Chloride: 101 mmol/L (ref 98–111)
Creatinine, Ser: 2.47 mg/dL — ABNORMAL HIGH (ref 0.61–1.24)
GFR, Estimated: 31 mL/min — ABNORMAL LOW (ref >60)
Glucose, Bld: 141 mg/dL — ABNORMAL HIGH (ref 70–99)
Potassium: 4.5 mmol/L (ref 3.5–5.1)
Sodium: 137 mmol/L (ref 135–145)

## 2021-05-03 LAB — GLUCOSE, CAPILLARY
Glucose-Capillary: 150 mg/dL — ABNORMAL HIGH (ref 70–99)
Glucose-Capillary: 139 mg/dL — ABNORMAL HIGH (ref 70–99)
Glucose-Capillary: 166 mg/dL — ABNORMAL HIGH (ref 70–99)
Glucose-Capillary: 127 mg/dL — ABNORMAL HIGH (ref 70–99)
Glucose-Capillary: 108 mg/dL — ABNORMAL HIGH (ref 70–99)
Glucose-Capillary: 99 mg/dL (ref 70–99)

## 2021-05-03 LAB — CBC
HCT: 32.4 % — ABNORMAL LOW (ref 39.0–52.0)
Hemoglobin: 10.4 g/dL — ABNORMAL LOW (ref 13.0–17.0)
MCH: 28.3 pg (ref 26.0–34.0)
MCHC: 32.1 g/dL (ref 30.0–36.0)
MCV: 88.3 fL (ref 80.0–100.0)
Platelets: 314 10*3/uL (ref 150–400)
RBC: 3.67 MIL/uL — ABNORMAL LOW (ref 4.22–5.81)
RDW: 14.2 % (ref 11.5–15.5)
WBC: 9.9 10*3/uL (ref 4.0–10.5)
nRBC: 0.2 % (ref 0.0–0.2)

## 2021-05-03 LAB — PHOSPHORUS: Phosphorus: 4.3 mg/dL (ref 2.5–4.6)

## 2021-05-03 LAB — MAGNESIUM: Magnesium: 2.9 mg/dL — ABNORMAL HIGH (ref 1.7–2.4)

## 2021-05-03 MED ORDER — FUROSEMIDE 10 MG/ML IJ SOLN
60.0000 mg | Freq: Three times a day (TID) | INTRAMUSCULAR | Status: DC
  Administered 2021-05-03 – 2021-05-06 (×9): 60 mg via INTRAVENOUS
  Filled 2021-05-03 (×9): qty 6

## 2021-05-03 NOTE — Progress Notes (Signed)
Pharmacist Heart Failure Core Measure Documentation  Assessment: Vincent [name] has an EF documented as <20% on 8/3 by echo.  Rationale: Heart failure patients with left ventricular systolic dysfunction (LVSD) and an EF < 40% should be prescribed an angiotensin converting enzyme inhibitor (ACEI) or angiotensin receptor blocker (ARB) at discharge unless a contraindication is documented in the medical record.  This patient is not currently on an ACEI or ARB for HF.  This note is being placed in the record in order to provide documentation that a contraindication to the use of these agents is present for this encounter.  ACE Inhibitor or Angiotensin Receptor Blocker is contraindicated (specify all that apply)  []   ACEI allergy AND ARB allergy []   Angioedema []   Moderate or severe aortic stenosis []   Hyperkalemia [x]   Hypotension []   Renal artery stenosis []   Worsening renal function, preexisting renal disease or dysfunction  Patient is on milrinone and can not tolerate antihypertensives.  Thank you for allowing pharmacy to participate in this patient's care.  , PharmD PGY1 Pharmacy Resident 05/03/2021 2:23 PM Check [email or web address] for unit specific pharmacy number

## 2021-05-03 NOTE — Progress Notes (Signed)
NAME:  Vincent [name], MRN:  [id number], DOB:  01-29-1971, LOS: 4 ADMISSION DATE:  04/29/2021, CONSULTATION DATE:  04/29/21 REFERRING MD:  Dr. Jacqulyn Bath CHIEF COMPLAINT:  found down   History of Present Illness:  50 y/o M, admitted to ICU after PEA arrest, likely respiratory driven.  ED note reviewed.  History unobtainable for patient as he is intubated and sedated.  History per chart and sister at bedside.  Patient was not acting right, relatively unresponsive, EMS called.  Found to be agonal breathing.  Unclear initial pulse ox was.  Eventually lost pulse.  CPR was initiated.  1 dose of epi.  About 6 months CPR.  ROSC obtained.  King airway placed.  Transported to the ED.  King airway exchanged for ET tube.  Noted to be hypotensive so norepinephrine started peripherally.  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  CTA PE protocol monitor patient reveals no PE, significant bilateral airspace disease was dense consolidations in the lower lobes, significant emphysema in upper lobes with interstitial thickening felt to be most consistent with pneumonitis/infection versus volume overload.  Moderate pericardial effusion noted by the radiologist.  Other findings as below.  He was given broad-spectrum antibiotics.  Initial lactate over 10.  1 L crystalloid given.  At time of evaluation he is on fentanyl drip and propofol.  Pertinent  Medical History  Tobacco Use Emphysema CVA DM  Significant Hospital Events: Including procedures, antibiotic start and stop dates in addition to other pertinent events   8/2: PEA Arrest, King airway in the field, CPR x6 minutes and epi x1, ROSC, Intubated in ED, Admitted to PCCM  8/4: Weaned off TTM, [place] O2 sat of 47%, started on milrinone and lasix  Interim History / Subjective:   No acute events overnight. Remains on milrinone which was increased yesterday. MRI not able to be completed yesterday.  Objective   Blood pressure 101/81, pulse 73, temperature 98.4  F (36.9 C), temperature source Axillary, resp. rate 18, height 5\' 8"  (1.727 m), weight 77.6 kg, SpO2 100 %.    Vent Mode: PRVC FiO2 (%):  [40 %] 40 % Set Rate:  [18 bmp] 18 bmp Vt Set:  [550 mL] 550 mL PEEP:  [5 cmH20-8 cmH20] 5 cmH20 Plateau Pressure:  [21 cmH20-30 cmH20] 30 cmH20   Intake/Output Summary (Last 24 hours) at 05/03/2021 1117 Last data filed at 05/03/2021 1000 Gross per 24 hour  Intake 1899.93 ml  Output 1275 ml  Net 624.93 ml   Filed Weights   05/01/21 0500 05/02/21 0448 05/03/21 0500  Weight: 75.1 kg 78.1 kg 77.6 kg    Examination: General: no acute distress, resting in bed, intubated HEENT: [place]/AT, moist mucous membranes, sclera anicteric Neuro: withdraws to pain, not following commands CV: rrr, s1s2, no murmurs PULM: course breath sounds bilaterally. No wheezing. GI: soft, non-tender, +distended, BS+ Extremities: warm, no edema Skin: no rashes  Resolved Hospital Problem list     Assessment & Plan:  Shock: cardiogenic vs septic PEA Cardiac Arrest - continue milrinone - continue diuresis  Acute Hypoxemic respiratory Failure Pneumonia - ceftriaxone day 4/7 - continue mechanical ventilatory support, turn Peep down to 5 due to elevated plateau pressure - Unable to perform PSV trials due to neurologic status  Encephalopathy - Pending MRI brain - EEG negative  - CT head negative for acute intracranial abnormality on 8/2  Acute Kidney Injury In setting of shock and heart failure - increase lasix to 60mg  q8hrs  Heart Failure with  Reduced Ejection Fraction Pericardial Effusion - diuresis as above  Ileus/Bowel Obstruction - place OG tube to low intermittent suction - obtaining upright abdominal imaging  Diabetes mellitus Type II -SSI  Lung Mass 2.1x2.6cm nodular density posteriorly in the left upper lobe - will need outpatient follow up if survives this hospitalization  Anemia - monitor  Best Practice (right click and "Reselect all  SmartList Selections" daily)   Diet/type: tubefeeds DVT prophylaxis: prophylactic heparin  GI prophylaxis: PPI Lines: Central line Foley:  Yes, and it is still needed Code Status:  full code Last date of multidisciplinary goals of care discussion [n/a]  Labs   CBC: Recent Labs  Lab 04/29/21 2040 04/29/21 2052 04/30/21 0022 04/30/21 0405 05/01/21 0300 05/02/21 0342 05/02/21 1053 05/02/21 1822 05/03/21 0500  WBC 17.6*  --  20.1*  --  9.4 10.1  --   --  9.9  NEUTROABS 8.1*  --   --   --   --   --   --   --   --   HGB 14.1   < > 14.7   < > 12.3* 11.7* 11.2* 10.9* 10.4*  HCT 46.4   < > 47.8   < > 37.3* 36.0* 33.0* 32.0* 32.4*  MCV 94.3  --  92.8  --  86.9 86.5  --   --  88.3  PLT 440*  --  463*  --  339 319  --   --  314   < > = values in this interval not displayed.    Basic Metabolic Panel: Recent Labs  Lab 04/29/21 2040 04/29/21 2052 04/30/21 0022 04/30/21 0405 05/01/21 0300 05/01/21 1420 05/02/21 0342 05/02/21 1053 05/02/21 1822 05/02/21 1823 05/03/21 0500  NA 140   < > 140   < > 134* 134* 137 139 137 138 137  K 3.9   < > 4.1   < > 3.1* 3.5 4.3 4.1 4.4 4.6 4.5  CL 109   < > 110   < > 103 101 102  --   --  103 101  CO2 14*  --  15*   < > 19* 22 24  --   --  25 24  GLUCOSE 252*   < > 124*   < > 146* 141* 144*  --   --  169* 141*  BUN 15   < > 17   < > 27* 28* 34*  --   --  47* 57*  CREATININE 1.47*   < > 1.50*   < > 1.95* 1.91* 2.22*  --   --  2.49* 2.47*  CALCIUM 7.7*  --  7.9*   < > 7.8* 7.7* 8.0*  --   --  8.3* 8.6*  MG 2.1  --  2.1  --  1.7  --  2.8*  --   --   --  2.9*  PHOS 7.0*  --  5.3*  --  2.4*  --  3.6  --   --   --  4.3   < > = values in this interval not displayed.   GFR: Estimated Creatinine Clearance: 34.2 mL/min (A) (by C-G formula based on SCr of 2.47 mg/dL (H)). Recent Labs  Lab 04/29/21 2041 04/30/21 0022 05/01/21 0300 05/02/21 0342 05/03/21 0500  WBC  --  20.1* 9.4 10.1 9.9  LATICACIDVEN 10.4* 7.1*  --   --   --     Liver  Function Tests: Recent Labs  Lab 04/29/21 2040  AST 34  ALT 31  ALKPHOS 49  BILITOT 0.9  PROT 5.7*  ALBUMIN 3.0*   No results for input(s): LIPASE, AMYLASE in the last 168 hours. No results for input(s): AMMONIA in the last 168 hours.  ABG    Component Value Date/Time   PHART 7.494 (H) 05/02/2021 1822   PCO2ART 30.6 (L) 05/02/2021 1822   PO2ART 95 05/02/2021 1822   HCO3 23.5 05/02/2021 1822   TCO2 24 05/02/2021 1822   ACIDBASEDEF 9.0 (H) 04/30/2021 0815   O2SAT 54.1 05/03/2021 0530     Coagulation Profile: Recent Labs  Lab 04/30/21 0022  INR 1.2    Cardiac Enzymes: No results for input(s): CKTOTAL, CKMB, CKMBINDEX, TROPONINI in the last 168 hours.  HbA1C: Hgb A1c MFr Bld  Date/Time Value Ref Range Status  04/30/2021 12:22 AM 5.8 (H) 4.8 - 5.6 % Final    Comment:    (NOTE) Pre diabetes:          5.7%-6.4%  Diabetes:              >6.4%  Glycemic control for   <7.0% adults with diabetes     CBG: Recent Labs  Lab 05/02/21 1957 05/02/21 2338 05/03/21 0351 05/03/21 0728 05/03/21 1108  GLUCAP 192* 146* 150* 139* 166*    Critical care time: 50 minutes    Melody Comas, MD [facility] Office: [phone]   See Amion for personal pager PCCM on call pager [phone] until 7pm. Please call Elink 7p-7a. [phone]

## 2021-05-04 ENCOUNTER — Inpatient Hospital Stay (HOSPITAL_COMMUNITY): Payer: Medicaid Other

## 2021-05-04 DIAGNOSIS — I5022 Chronic systolic (congestive) heart failure: Secondary | ICD-10-CM

## 2021-05-04 DIAGNOSIS — J9601 Acute respiratory failure with hypoxia: Secondary | ICD-10-CM | POA: Diagnosis not present

## 2021-05-04 LAB — BASIC METABOLIC PANEL
Anion gap: 11 (ref 5–15)
BUN: 58 mg/dL — ABNORMAL HIGH (ref 6–20)
CO2: 26 mmol/L (ref 22–32)
Calcium: 9.1 mg/dL (ref 8.9–10.3)
Chloride: 100 mmol/L (ref 98–111)
Creatinine, Ser: 2.12 mg/dL — ABNORMAL HIGH (ref 0.61–1.24)
GFR, Estimated: 37 mL/min — ABNORMAL LOW (ref >60)
Glucose, Bld: 113 mg/dL — ABNORMAL HIGH (ref 70–99)
Potassium: 3.9 mmol/L (ref 3.5–5.1)
Sodium: 137 mmol/L (ref 135–145)

## 2021-05-04 LAB — CBC
HCT: 33.7 % — ABNORMAL LOW (ref 39.0–52.0)
Hemoglobin: 11 g/dL — ABNORMAL LOW (ref 13.0–17.0)
MCH: 28.5 pg (ref 26.0–34.0)
MCHC: 32.6 g/dL (ref 30.0–36.0)
MCV: 87.3 fL (ref 80.0–100.0)
Platelets: 349 10*3/uL (ref 150–400)
RBC: 3.86 MIL/uL — ABNORMAL LOW (ref 4.22–5.81)
RDW: 14.2 % (ref 11.5–15.5)
WBC: 9.4 10*3/uL (ref 4.0–10.5)
nRBC: 0 % (ref 0.0–0.2)

## 2021-05-04 LAB — PHOSPHORUS: Phosphorus: 4.8 mg/dL — ABNORMAL HIGH (ref 2.5–4.6)

## 2021-05-04 LAB — ECHOCARDIOGRAM LIMITED
Calc EF: 36.7 %
Height: 68 in
S' Lateral: 4.4 cm
Single Plane A2C EF: 33.7 %
Single Plane A4C EF: 39.3 %
Weight: 2652.57 oz

## 2021-05-04 LAB — GLUCOSE, CAPILLARY
Glucose-Capillary: 99 mg/dL (ref 70–99)
Glucose-Capillary: 105 mg/dL — ABNORMAL HIGH (ref 70–99)
Glucose-Capillary: 105 mg/dL — ABNORMAL HIGH (ref 70–99)
Glucose-Capillary: 108 mg/dL — ABNORMAL HIGH (ref 70–99)
Glucose-Capillary: 110 mg/dL — ABNORMAL HIGH (ref 70–99)
Glucose-Capillary: 119 mg/dL — ABNORMAL HIGH (ref 70–99)

## 2021-05-04 LAB — COOXEMETRY PANEL
Carboxyhemoglobin: 0.8 % (ref 0.5–1.5)
Methemoglobin: 0.9 % (ref 0.0–1.5)
O2 Saturation: 39.7 %
Total hemoglobin: 11.5 g/dL — ABNORMAL LOW (ref 12.0–16.0)

## 2021-05-04 LAB — MAGNESIUM: Magnesium: 2.6 mg/dL — ABNORMAL HIGH (ref 1.7–2.4)

## 2021-05-04 LAB — LACTIC ACID, PLASMA: Lactic Acid, Venous: 1.2 mmol/L (ref 0.5–1.9)

## 2021-05-04 LAB — TRIGLYCERIDES: Triglycerides: 69 mg/dL (ref <150)

## 2021-05-04 MED ORDER — BISACODYL 10 MG RE SUPP
10.0000 mg | Freq: Once | RECTAL | Status: AC
  Administered 2021-05-04: 10 mg via RECTAL
  Filled 2021-05-04: qty 1

## 2021-05-04 NOTE — Progress Notes (Signed)
NAME:  Vincent [name], MRN:  [id number], DOB:  [birth year], LOS: 5 ADMISSION DATE:  04/29/2021, CONSULTATION DATE:  04/29/21 REFERRING MD:  Dr. Jacqulyn Bath CHIEF COMPLAINT:  found down   History of Present Illness:  50 y/o M, admitted to ICU after PEA arrest, likely respiratory driven.  ED note reviewed.  History unobtainable for patient as he is intubated and sedated.  History per chart and sister at bedside.  Patient was not acting right, relatively unresponsive, EMS called.  Found to be agonal breathing.  Unclear initial pulse ox was.  Eventually lost pulse.  CPR was initiated.  1 dose of epi.  About 6 months CPR.  ROSC obtained.  King airway placed.  Transported to the ED.  King airway exchanged for ET tube.  Noted to be hypotensive so norepinephrine started peripherally.  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  CTA PE protocol monitor patient reveals no PE, significant bilateral airspace disease was dense consolidations in the lower lobes, significant emphysema in upper lobes with interstitial thickening felt to be most consistent with pneumonitis/infection versus volume overload.  Moderate pericardial effusion noted by the radiologist.  Other findings as below.  He was given broad-spectrum antibiotics.  Initial lactate over 10.  1 L crystalloid given.  At time of evaluation he is on fentanyl drip and propofol.  Pertinent  Medical History  Tobacco Use Emphysema CVA DM  Significant Hospital Events: Including procedures, antibiotic start and stop dates in addition to other pertinent events   8/2: PEA Arrest, King airway in the field, CPR x6 minutes and epi x1, ROSC, Intubated in ED, Admitted to PCCM  8/4: Weaned off TTM, [place]ox O2 sat of 47%, started on milrinone and lasix 8/5 milrinone increased 8/6 TF stopped, OG placed to suction for ileus  Interim History / Subjective:   No acute events overnight. MRI brain performed without signs of anoxic injury.  Objective   Blood pressure  110/79, pulse 69, temperature 97.6 F (36.4 C), temperature source Axillary, resp. rate 18, height 5\' 8"  (1.727 m), weight 75.2 kg, SpO2 97 %.    Vent Mode: PRVC FiO2 (%):  [40 %] 40 % Set Rate:  [18 bmp] 18 bmp Vt Set:  [550 mL] 550 mL PEEP:  [5 cmH20-8 cmH20] 5 cmH20 Plateau Pressure:  [25 cmH20-37 cmH20] 30 cmH20   Intake/Output Summary (Last 24 hours) at 05/04/2021 0925 Last data filed at 05/04/2021 0800 Gross per 24 hour  Intake 1609.62 ml  Output 1910 ml  Net -300.38 ml   Filed Weights   05/02/21 0448 05/03/21 0500 05/04/21 0500  Weight: 78.1 kg 77.6 kg 75.2 kg    Examination: General: no acute distress, resting in bed, intubated HEENT: [place]/AT, moist mucous membranes, sclera anicteric Neuro: opens eyes, twitching of face/neck, not following commands CV: rrr, s1s2, no murmurs PULM: course breath sounds bilaterally. No wheezing. GI: soft, non-tender, +distended, BS+ Extremities: warm, no edema Skin: no rashes  Resolved Hospital Problem list     Assessment & Plan:  Shock: cardiogenic vs septic PEA Cardiac Arrest - continue milrinone - continue diuresis - repeat limited echo today  Acute Hypoxemic respiratory Failure Pneumonia - ceftriaxone day 5/7 - continue mechanical ventilatory support - Unable to perform PSV trials due to neurologic status  Encephalopathy - MRI brain 8/6 no acute abnormality - EEG negative  - CT head negative for acute intracranial abnormality on 8/2 - likely toxic/metabolic, will continue to monitor  Acute Kidney Injury In setting of shock  and heart failure - continue lasix to 60mg  q8hrs - Cr improved today  Heart Failure with Reduced Ejection Fraction Pericardial Effusion - diuresis as above - repeat limited echo today  Ileus/Bowel Obstruction - continue OG on low intermittent suction - will give dulcolax suppositroy today, no bowel movement in 2 days  Diabetes mellitus Type II -SSI  Lung Mass 2.1x2.6cm nodular density  posteriorly in the left upper lobe - will need outpatient follow up if survives this hospitalization  Anemia - monitor  Best Practice (right click and "Reselect all SmartList Selections" daily)   Diet/type: tubefeeds DVT prophylaxis: prophylactic heparin  GI prophylaxis: PPI Lines: Central line Foley:  Yes, and it is still needed Code Status:  full code Last date of multidisciplinary goals of care discussion [n/a]  Labs   CBC: Recent Labs  Lab 04/29/21 2040 04/29/21 2052 04/30/21 0022 04/30/21 0405 05/01/21 0300 05/02/21 0342 05/02/21 1053 05/02/21 1822 05/03/21 0500 05/04/21 0021  WBC 17.6*  --  20.1*  --  9.4 10.1  --   --  9.9 9.4  NEUTROABS 8.1*  --   --   --   --   --   --   --   --   --   HGB 14.1   < > 14.7   < > 12.3* 11.7* 11.2* 10.9* 10.4* 11.0*  HCT 46.4   < > 47.8   < > 37.3* 36.0* 33.0* 32.0* 32.4* 33.7*  MCV 94.3  --  92.8  --  86.9 86.5  --   --  88.3 87.3  PLT 440*  --  463*  --  339 319  --   --  314 349   < > = values in this interval not displayed.    Basic Metabolic Panel: Recent Labs  Lab 04/30/21 0022 04/30/21 0405 05/01/21 0300 05/01/21 1420 05/02/21 0342 05/02/21 1053 05/02/21 1822 05/02/21 1823 05/03/21 0500 05/04/21 0021  NA 140   < > 134* 134* 137 139 137 138 137 137  K 4.1   < > 3.1* 3.5 4.3 4.1 4.4 4.6 4.5 3.9  CL 110   < > 103 101 102  --   --  103 101 100  CO2 15*   < > 19* 22 24  --   --  25 24 26   GLUCOSE 124*   < > 146* 141* 144*  --   --  169* 141* 113*  BUN 17   < > 27* 28* 34*  --   --  47* 57* 58*  CREATININE 1.50*   < > 1.95* 1.91* 2.22*  --   --  2.49* 2.47* 2.12*  CALCIUM 7.9*   < > 7.8* 7.7* 8.0*  --   --  8.3* 8.6* 9.1  MG 2.1  --  1.7  --  2.8*  --   --   --  2.9* 2.6*  PHOS 5.3*  --  2.4*  --  3.6  --   --   --  4.3 4.8*   < > = values in this interval not displayed.   GFR: Estimated Creatinine Clearance: 39.9 mL/min (A) (by C-G formula based on SCr of 2.12 mg/dL (H)). Recent Labs  Lab 04/29/21 2041  04/30/21 0022 05/01/21 0300 05/02/21 0342 05/03/21 0500 05/04/21 0021  WBC  --  20.1* 9.4 10.1 9.9 9.4  LATICACIDVEN 10.4* 7.1*  --   --   --   --     Liver Function Tests: Recent Labs  Lab 04/29/21 2040  AST 34  ALT 31  ALKPHOS 49  BILITOT 0.9  PROT 5.7*  ALBUMIN 3.0*   No results for input(s): LIPASE, AMYLASE in the last 168 hours. No results for input(s): AMMONIA in the last 168 hours.  ABG    Component Value Date/Time   PHART 7.494 (H) 05/02/2021 1822   PCO2ART 30.6 (L) 05/02/2021 1822   PO2ART 95 05/02/2021 1822   HCO3 23.5 05/02/2021 1822   TCO2 24 05/02/2021 1822   ACIDBASEDEF 9.0 (H) 04/30/2021 0815   O2SAT 39.7 05/04/2021 0021     Coagulation Profile: Recent Labs  Lab 04/30/21 0022  INR 1.2    Cardiac Enzymes: No results for input(s): CKTOTAL, CKMB, CKMBINDEX, TROPONINI in the last 168 hours.  HbA1C: Hgb A1c MFr Bld  Date/Time Value Ref Range Status  04/30/2021 12:22 AM 5.8 (H) 4.8 - 5.6 % Final    Comment:    (NOTE) Pre diabetes:          5.7%-6.4%  Diabetes:              >6.4%  Glycemic control for   <7.0% adults with diabetes     CBG: Recent Labs  Lab 05/03/21 1528 05/03/21 1925 05/03/21 2255 05/04/21 0316 05/04/21 0725  GLUCAP 127* 108* 99 99 105*    Critical care time: 40 minutes    Melody Comas, MD [facility] Office: [phone]   See Amion for personal pager PCCM on call pager [phone] until 7pm. Please call Elink 7p-7a. [phone]

## 2021-05-04 NOTE — Progress Notes (Signed)
   05/04/21 0258  Vent Select  $ Ventilator Initial/Subsequent  Subsequent  Adult Vent Y  Airway 7.5 mm  Placement Date/Time: 04/29/21 2037   Airway Device: Endotracheal Tube  ETT Types: Oral  Size (mm): 7.5 mm  Secured at (cm): 25 cm  Secured at (cm) 25 cm  Measured From Lips  Secured Location Right  Tube Holder Repositioned Yes  Prone position No  Cuff Pressure (cm H2O) Green OR 18-26 CmH2O  Site Condition Dry  Adult Ventilator Settings  Vent Type Servo i  Humidity HME  Vent Mode PRVC  Vt Set 550 mL  Set Rate 18 bmp  FiO2 (%) 40 %  I Time 0.9 Sec(s)  PEEP 5 cmH20  Adult Ventilator Measurements  Peak Airway Pressure 33 L/min  Mean Airway Pressure 12 cmH20  Plateau Pressure 25 cmH20  Resp Rate Spontaneous 0 br/min  Resp Rate Total 18 br/min  Exhaled Vt 536 mL  Measured Ve 9.9 mL  I:E Ratio Measured 1:2.7  Auto PEEP 0 cmH20  Total PEEP 5 cmH20  SpO2 100 %  Adult Ventilator Alarms  Alarms On Y  Ve High Alarm 21 L/min  Ve Low Alarm 4 L/min  Resp Rate High Alarm 38 br/min  Resp Rate Low Alarm 10  PEEP Low Alarm 3 cmH2O  Press High Alarm 50 cmH2O  T Apnea 20 sec(s)  VAP Prevention  HOB> 30 Degrees Y  Breath Sounds  Bilateral Breath Sounds Rhonchi  Vent Respiratory Assessment  Respiratory Pattern Regular;Unlabored  Airway Suctioning/Secretions  Suction Type ETT  Suction Device  Catheter  Secretion Amount Small  Secretion Color White  Secretion Consistency Thick  Suction Tolerance Tolerated well  Suctioning Adverse Effects None  Transported pt. To MRI via vent with no incident

## 2021-05-04 NOTE — Progress Notes (Signed)
*  PRELIMINARY RESULTS* Echocardiogram Limited Echocardiogram has been performed.  Stacey Drain 05/04/2021, 3:52 PM

## 2021-05-05 ENCOUNTER — Encounter (HOSPITAL_COMMUNITY): Payer: Self-pay | Admitting: Pulmonary Disease

## 2021-05-05 ENCOUNTER — Inpatient Hospital Stay (HOSPITAL_COMMUNITY): Payer: Medicaid Other

## 2021-05-05 DIAGNOSIS — G928 Other toxic encephalopathy: Secondary | ICD-10-CM | POA: Diagnosis not present

## 2021-05-05 DIAGNOSIS — R570 Cardiogenic shock: Secondary | ICD-10-CM | POA: Diagnosis not present

## 2021-05-05 DIAGNOSIS — J9601 Acute respiratory failure with hypoxia: Secondary | ICD-10-CM | POA: Diagnosis not present

## 2021-05-05 DIAGNOSIS — R569 Unspecified convulsions: Secondary | ICD-10-CM | POA: Diagnosis not present

## 2021-05-05 DIAGNOSIS — Z9911 Dependence on respirator [ventilator] status: Secondary | ICD-10-CM

## 2021-05-05 DIAGNOSIS — I469 Cardiac arrest, cause unspecified: Secondary | ICD-10-CM | POA: Diagnosis not present

## 2021-05-05 DIAGNOSIS — J189 Pneumonia, unspecified organism: Secondary | ICD-10-CM | POA: Diagnosis not present

## 2021-05-05 LAB — COOXEMETRY PANEL
Carboxyhemoglobin: 0.9 % (ref 0.5–1.5)
Carboxyhemoglobin: 0.9 % (ref 0.5–1.5)
Methemoglobin: 0.9 % (ref 0.0–1.5)
Methemoglobin: 0.8 % (ref 0.0–1.5)
O2 Saturation: 44.5 %
O2 Saturation: 71.5 %
Total hemoglobin: 11.1 g/dL — ABNORMAL LOW (ref 12.0–16.0)
Total hemoglobin: 14.4 g/dL (ref 12.0–16.0)

## 2021-05-05 LAB — BASIC METABOLIC PANEL
Anion gap: 12 (ref 5–15)
BUN: 60 mg/dL — ABNORMAL HIGH (ref 6–20)
CO2: 28 mmol/L (ref 22–32)
Calcium: 9 mg/dL (ref 8.9–10.3)
Chloride: 102 mmol/L (ref 98–111)
Creatinine, Ser: 2.21 mg/dL — ABNORMAL HIGH (ref 0.61–1.24)
GFR, Estimated: 35 mL/min — ABNORMAL LOW (ref >60)
Glucose, Bld: 127 mg/dL — ABNORMAL HIGH (ref 70–99)
Potassium: 3.4 mmol/L — ABNORMAL LOW (ref 3.5–5.1)
Sodium: 142 mmol/L (ref 135–145)

## 2021-05-05 LAB — CBC
HCT: 32.9 % — ABNORMAL LOW (ref 39.0–52.0)
Hemoglobin: 11 g/dL — ABNORMAL LOW (ref 13.0–17.0)
MCH: 28.7 pg (ref 26.0–34.0)
MCHC: 33.4 g/dL (ref 30.0–36.0)
MCV: 85.9 fL (ref 80.0–100.0)
Platelets: 388 10*3/uL (ref 150–400)
RBC: 3.83 MIL/uL — ABNORMAL LOW (ref 4.22–5.81)
RDW: 14 % (ref 11.5–15.5)
WBC: 9.1 10*3/uL (ref 4.0–10.5)
nRBC: 0 % (ref 0.0–0.2)

## 2021-05-05 LAB — POTASSIUM: Potassium: 4.5 mmol/L (ref 3.5–5.1)

## 2021-05-05 LAB — GLUCOSE, CAPILLARY
Glucose-Capillary: 121 mg/dL — ABNORMAL HIGH (ref 70–99)
Glucose-Capillary: 90 mg/dL (ref 70–99)
Glucose-Capillary: 92 mg/dL (ref 70–99)
Glucose-Capillary: 97 mg/dL (ref 70–99)
Glucose-Capillary: 93 mg/dL (ref 70–99)
Glucose-Capillary: 84 mg/dL (ref 70–99)

## 2021-05-05 LAB — PHOSPHORUS: Phosphorus: 4.5 mg/dL (ref 2.5–4.6)

## 2021-05-05 LAB — MAGNESIUM: Magnesium: 2.5 mg/dL — ABNORMAL HIGH (ref 1.7–2.4)

## 2021-05-05 MED ORDER — PROPOFOL 1000 MG/100ML IV EMUL: 5.0000 ug/kg/min | INTRAVENOUS | Status: DC

## 2021-05-05 MED ORDER — LEVETIRACETAM IN NACL 500 MG/100ML IV SOLN
500.0000 mg | Freq: Two times a day (BID) | INTRAVENOUS | Status: DC
  Administered 2021-05-06 – 2021-05-14 (×16): 500 mg via INTRAVENOUS
  Filled 2021-05-05 (×17): qty 100

## 2021-05-05 MED ORDER — IPRATROPIUM-ALBUTEROL 0.5-2.5 (3) MG/3ML IN SOLN: 3.0000 mL | Freq: Four times a day (QID) | RESPIRATORY_TRACT | Status: DC | PRN

## 2021-05-05 MED ORDER — MILRINONE LACTATE IN DEXTROSE 20-5 MG/100ML-% IV SOLN
0.5000 ug/kg/min | INTRAVENOUS | Status: DC
  Administered 2021-05-05 – 2021-05-06 (×3): 0.5 ug/kg/min via INTRAVENOUS
  Filled 2021-05-05 (×3): qty 100

## 2021-05-05 MED ORDER — LEVETIRACETAM IN NACL 1000 MG/100ML IV SOLN
1000.0000 mg | INTRAVENOUS | Status: AC
  Administered 2021-05-05: 1000 mg via INTRAVENOUS
  Filled 2021-05-05: qty 100

## 2021-05-05 MED ORDER — AMOXICILLIN-POT CLAVULANATE 875-125 MG PO TABS
1.0000 | ORAL_TABLET | Freq: Two times a day (BID) | ORAL | Status: AC
Start: 2021-05-05 — End: 2021-05-06
  Administered 2021-05-05 – 2021-05-06 (×3): 1
  Filled 2021-05-05 (×3): qty 1

## 2021-05-05 MED ORDER — POTASSIUM CHLORIDE 10 MEQ/50ML IV SOLN
10.0000 meq | INTRAVENOUS | Status: AC
  Administered 2021-05-05 (×4): 10 meq via INTRAVENOUS
  Filled 2021-05-05 (×4): qty 50

## 2021-05-05 MED ORDER — CLONAZEPAM 1 MG PO TABS
1.0000 mg | ORAL_TABLET | Freq: Three times a day (TID) | ORAL | Status: DC | PRN
  Administered 2021-05-05 – 2021-05-09 (×7): 1 mg via ORAL
  Filled 2021-05-05 (×9): qty 1

## 2021-05-05 NOTE — Progress Notes (Addendum)
Called by patient RN regarding possible myoclonic movements. I will add Keppra to his regimen. Klonoipin per tube for breakthrough Routine EEG in the morning Will follow   -- Milon Dikes, MD Neurologist [facility] Pager: [phone]

## 2021-05-05 NOTE — Progress Notes (Addendum)
Patient experienced multiple episodes of rapid muscles movements in bilateral upper extremities and chest. Movements appear shiver-like in appearance. Patient able to shift eye gaze and pupils equal and reactive to light during episodes. EEG button was pushed when patient experienced movement. One episode would stop completely when patients hand was touched/held and then resume fast shivering when I let his hand rest on the bed. On call neurologist notified.

## 2021-05-05 NOTE — Consult Note (Signed)
Neurology Consultation  Reason for Consult: Neuro prognostication after cardiac arrest Referring Physician: Dr. Ernest Mallick, PCCM   CC: Cardiac  History is obtained from: Chart  HPI: Vincent [name] is a 50 y.o. male past medical history of tobacco use, emphysema, prior stroke with presumably left-sided spastic hemiparesis, diabetes, admitted to the hospital on 04/29/2021 after a PEA arrest in the field with CPR for 6 minutes prior to [place] and admitted to the critical care service.  Started on TTM and weaned off on 8 4.  Found to have severe cardiomyopathy with ejection fraction of 20%. Hospital stay complicated with ileus.  Also concern for myoclonic jerking while off of TTM-but none reported on the EEG that was done while he was on hypothermia. Today, sedation was reduced and his exam remained poor for which the critical care physician sought neurological consultation. Patient is unable to provide any history.  No family at bedside.  ROS: Unable to obtain due to altered mental status.   No past medical history on file. As in HPI.  Family History  Problem Relation Age of Onset   Coronary artery disease Father    Stroke Sister    Coronary artery disease Brother    Social History:   reports that he has been smoking cigarettes. He has never used smokeless tobacco. No history on file for alcohol use and drug use.  Medications  Current Facility-Administered Medications:    0.9 %  sodium chloride infusion, 250 mL, Intravenous, Continuous, Desai, Rahul P, PA-C, Stopped at 05/04/21 1736   acetaminophen (TYLENOL) tablet 650 mg, 650 mg, Per Tube, Q6H PRN, Ursula Beath, RPH, 650 mg at 05/04/21 1952   cefTRIAXone (ROCEPHIN) 2 g in sodium chloride 0.9 % 100 mL IVPB, 2 g, Intravenous, Q24H, Julian Hy, DO, Stopped at 05/04/21 2034   chlorhexidine gluconate (MEDLINE KIT) (PERIDEX) 0.12 % solution 15 mL, 15 mL, Mouth Rinse, BID, Hunsucker, Bonna Gains, MD, 15 mL at 05/05/21 9833    Chlorhexidine Gluconate Cloth 2 % PADS 6 each, 6 each, Topical, Q0600, Hunsucker, Bonna Gains, MD, 6 each at 05/04/21 0618   feeding supplement (VITAL AF 1.2 CAL) liquid 1,000 mL, 1,000 mL, Per Tube, Continuous, Candee Furbish, MD, Last Rate: 65 mL/hr at 05/01/21 1428, 1,000 mL at 05/01/21 1428   fentaNYL (SUBLIMAZE) injection 100 mcg, 100 mcg, Intravenous, Q2H PRN, Long, Wonda Olds, MD, 100 mcg at 05/04/21 1952   furosemide (LASIX) injection 60 mg, 60 mg, Intravenous, Q8H, Freda Jackson B, MD, 60 mg at 05/05/21 0505   heparin injection 5,000 Units, 5,000 Units, Subcutaneous, Q8H, Desai, Rahul P, PA-C, 5,000 Units at 05/05/21 0505   insulin aspart (novoLOG) injection 0-15 Units, 0-15 Units, Subcutaneous, Q4H, Hunsucker, Bonna Gains, MD, 2 Units at 05/05/21 0401   MEDLINE mouth rinse, 15 mL, Mouth Rinse, 10 times per day, Hunsucker, Bonna Gains, MD, 15 mL at 05/05/21 0901   milrinone (PRIMACOR) 20 MG/100 ML (0.2 mg/mL) infusion, 0.5 mcg/kg/min, Intravenous, Continuous, Julian Hy, DO, Last Rate: 10.59 mL/hr at 05/05/21 1143, 0.5 mcg/kg/min at 05/05/21 1143   norepinephrine (LEVOPHED) 31m in 2527mpremix infusion, 0-40 mcg/min, Intravenous, Titrated, SmCandee FurbishMD, Stopped at 05/02/21 068250 pantoprazole sodium (PROTONIX) 40 mg/20 mL oral suspension 40 mg, 40 mg, Per Tube, QHS, Wise, Nason S, RPH, 40 mg at 05/04/21 2240   polyethylene glycol (MIRALAX / GLYCOLAX) packet 17 g, 17 g, Per Tube, BID, WiUrsula BeathRPH, 17 g at 05/05/21 1048  sodium chloride flush (NS) 0.9 % injection 10-40 mL, 10-40 mL, Intracatheter, Q12H, Candee Furbish, MD, 10 mL at 05/05/21 0936   sodium chloride flush (NS) 0.9 % injection 10-40 mL, 10-40 mL, Intracatheter, PRN, Candee Furbish, MD  Exam: Current vital signs: BP (!) 136/116   Pulse 98   Temp 97.7 F (36.5 C) (Oral)   Resp 18   Ht 5' 8"  (1.727 m)   Wt 70.6 kg   SpO2 95%   BMI 23.67 kg/m  Vital signs in last 24 hours: Temp:  [97.6 F (36.4 C)-100.2 F  (37.9 C)] 97.7 F (36.5 C) (08/08 0700) Pulse Rate:  [56-106] 98 (08/08 1112) Resp:  [18] 18 (08/08 1112) BP: (95-144)/(65-116) 136/116 (08/08 1112) SpO2:  [91 %-100 %] 95 % (08/08 1112) FiO2 (%):  [40 %] 40 % (08/08 1112) Weight:  [70.6 kg] 70.6 kg (08/08 0500) General: Patient has been off of sedation since this morning.  Awake, appears alert Intubated HEENT: Normocephalic/atraumatic CVs: Regular rhythm Respiratory: Vented Abdomen: Mildly distended, nontender Neurological exam Awake alert Does not follow commands consistently but does follow some simple commands. Has a mild rightward gaze preference but is able to pull to look to the left Opens and closes eyes inconsistently to voice. Pupils are equal round reactive to light Does not blink to threat from either side Difficult to ascertain facial symmetry Left upper extremity spastic, with minimal withdrawal to noxious simulation. Left lower extremity with mild withdrawal to noxious stimulation and mild increased tone Right upper and lower extremity normal tone and much stronger withdrawal to noxious stimulation.  Labs I have reviewed labs in epic and the results pertinent to this consultation are:   CBC    Component Value Date/Time   WBC 9.1 05/05/2021 0304   RBC 3.83 (L) 05/05/2021 0304   HGB 11.0 (L) 05/05/2021 0304   HCT 32.9 (L) 05/05/2021 0304   PLT 388 05/05/2021 0304   MCV 85.9 05/05/2021 0304   MCH 28.7 05/05/2021 0304   MCHC 33.4 05/05/2021 0304   RDW 14.0 05/05/2021 0304   LYMPHSABS 8.1 (H) 04/29/2021 2040   MONOABS 0.7 04/29/2021 2040   EOSABS 0.2 04/29/2021 2040   BASOSABS 0.1 04/29/2021 2040    CMP     Component Value Date/Time   NA 142 05/05/2021 0304   K 4.5 05/05/2021 1031   CL 102 05/05/2021 0304   CO2 28 05/05/2021 0304   GLUCOSE 127 (H) 05/05/2021 0304   BUN 60 (H) 05/05/2021 0304   CREATININE 2.21 (H) 05/05/2021 0304   CALCIUM 9.0 05/05/2021 0304   PROT 5.7 (L) 04/29/2021 2040    ALBUMIN 3.0 (L) 04/29/2021 2040   AST 34 04/29/2021 2040   ALT 31 04/29/2021 2040   ALKPHOS 49 04/29/2021 2040   BILITOT 0.9 04/29/2021 2040   GFRNONAA 35 (L) 05/05/2021 0304    Imaging I have reviewed the images obtained: MRI brain yesterday with no acute changes.  No evidence of hypoxic or anoxic injury.  Large area of encephalomalacia in the right MCA territory presumably from the old stroke.  Assessment: 50 year old with past history of tobacco abuse, emphysema, prior stroke with left-sided spastic hemiparesis, diabetes admitted after PEA arrest and found to have an ejection fraction of 20%.  Remains quite encephalopathic, most likely due to prolonged effect of sedation as well as due to poor brain reserve given prior RMCA stroke.    Now with sedation off since this morning, he did have somewhat of a better  exam than reported, although still not following commands consistently.  There might be a component of mild hypoxic ischemic encephalopathy but his imaging is not consistent with anoxic brain injury.  He is also on ceftriaxone-sometimes cephalosporin toxicity can cloud the picture as well.  Impression: Post cardiac arrest-question mild hypoxic brain injury but doubt that he has any component of anoxic brain damage Cardiomyopathy Prior history of right MCA stroke with left hemiparesis Concern for myoclonic jerking Evaluate for cephalosporin toxicity.  Recommendations: I would continue supportive care per primary team. If he continues to have any myoclonic jerking, can address it as it happens-may need to hook him up to EEG again if that would happen. Consider alternative to ceftriaxone for concern for cephalosporin toxicity which can lead to clinical and EEG findings that can mimic seizures. Minimize sedation as you are. I will follow with you tomorrow Discussed with Dr. Carlis Abbott and the PCCM team.   -- Amie Portland, MD Neurologist [facility] Pager:  [phone]  CRITICAL CARE ATTESTATION Performed by: Amie Portland, MD Total critical care time: 33 minutes Critical care time was exclusive of separately billable procedures and treating other patients and/or supervising APPs/Residents/Students Critical care was necessary to treat or prevent imminent or life-threatening deterioration due to multifactorial encephalopathy  This patient is critically ill and at significant risk for neurological worsening and/or death and care requires constant monitoring. Critical care was time spent personally by me on the following activities: development of treatment plan with patient and/or surrogate as well as nursing, discussions with consultants, evaluation of patient's response to treatment, examination of patient, obtaining history from patient or surrogate, ordering and performing treatments and interventions, ordering and review of laboratory studies, ordering and review of radiographic studies, pulse oximetry, re-evaluation of patient's condition, participation in multidisciplinary rounds and medical decision making of high complexity in the care of this patient.

## 2021-05-05 NOTE — Progress Notes (Signed)
NAME:  Vincent [name], MRN:  [id number], DOB:  10/24/1970, LOS: 6 ADMISSION DATE:  04/29/2021, CONSULTATION DATE:  04/29/21 REFERRING [name]:  Dr. Jacqulyn Bath, CHIEF COMPLAINT:  Found Down   History of present illness   50 y/o M, admitted to ICU after PEA arrest, likely respiratory driven.  ED note reviewed.  History unobtainable for patient as he is intubated and sedated.  History per chart and sister at bedside.  Patient was not acting right, relatively unresponsive, EMS called.  Found to be agonal breathing.  Unclear initial pulse ox was.  Eventually lost pulse.  CPR was initiated.  1 dose of epi.  About 6 months CPR.  ROSC obtained.  King airway placed.  Transported to the ED.  King airway exchanged for ET tube.  Noted to be hypotensive so norepinephrine started peripherally.  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  CTA PE protocol monitor patient reveals no PE, significant bilateral airspace disease was dense consolidations in the lower lobes, significant emphysema in upper lobes with interstitial thickening felt to be most consistent with pneumonitis/infection versus volume overload.  Moderate pericardial effusion noted by the radiologist.  Other findings as below.  He was given broad-spectrum antibiotics.  Initial lactate over 10.  1 L crystalloid given.  At time of evaluation he is on fentanyl drip and propofol.  Past Medical History  Tobacco Use Emphysema CVA DM  Significant Hospital Events   8/2: PEA Arrest, King airway in the field, CPR x6 minutes and epi x1, ROSC, Intubated in ED, Admitted to PCCM  8/4: Weaned off TTM, Coox O2 sat of 47%, started on milrinone and lasix 8/5 milrinone increased 8/6 TF stopped, OG placed to suction for ileus 8/8 Post pyloric Cortrak placed  Procedures:  [facility]: 8/3 Cortrak: 8/8  Significant Diagnostic Tests:   CT HEAD WO CONTRAST ( )   Result Date: 04/29/2021 CLINICAL DATA:  Mental status change EXAM: CT HEAD WITHOUT CONTRAST TECHNIQUE:  Contiguous axial images were obtained from the base of the skull through the vertex without intravenous contrast. COMPARISON:  None. FINDINGS: Brain: No acute territorial infarction, hemorrhage or intracranial mass is visualized. Chronic right MCA infarct with extensive encephalomalacia involving the right frontal, parietal and temporal lobes as well as the right thalamus and basal ganglia. Moderate atrophy. Ex vacuo dilatation of right lateral ventricle. Atrophy of right brainstem. Vascular: No hyperdense vessels.  Carotid vascular calcification Skull: Normal. Negative for fracture or focal lesion. Sinuses/Orbits: Mucosal thickening in the sinuses. Chronic appearing deformity of the medial wall right orbit. Other: Incomplete fusion posterior arch of C1 IMPRESSION: 1. No definite CT evidence for acute intracranial abnormality. 2. Atrophy and chronic right MCA infarct. Electronically Signed   By: Jasmine Pang M.D.   On: 04/29/2021 22:09   CT Angio Chest PE W and/or Wo Contrast   Result Date: 04/29/2021 CLINICAL DATA:  PE suspected, high prob Post CPR. EXAM: CT ANGIOGRAPHY CHEST WITH CONTRAST TECHNIQUE: Multidetector CT imaging of the chest was performed using the standard protocol during bolus administration of intravenous contrast. Multiplanar CT image reconstructions and MIPs were obtained to evaluate the vascular anatomy. CONTRAST:  43mL OMNIPAQUE IOHEXOL 350 MG/ML SOLN COMPARISON:  Chest radiograph earlier today. FINDINGS: Cardiovascular: There are no filling defects within the pulmonary arteries to suggest pulmonary embolus. Mild aortic atherosclerosis. Cannot assess for dissection given phase of contrast tailored to pulmonary arteries S1. Multi chamber cardiomegaly. Minimal contrast refluxes into the hepatic veins and IVC. Moderate size circumferential pericardial effusion. This  measures up to 17 mm in depth adjacent to the right ventricle. Mediastinum/Nodes: Shotty mediastinal adenopathy, including right  anterior paratracheal node measuring 10 mm, series 5, image 37. Bilateral hilar lymph nodes measuring 9-10 mm. The esophagus is decompressed by enteric tube. No visualized thyroid nodule. Lungs/Pleura: The endotracheal tube tip is at the level of the carina, recommend retraction of 2-3 cm. Dense lower lobe consolidation, left greater than right, suspicious for aspiration. There additional patchy, ground-glass and confluent airspace opacities throughout both lungs. Mild smooth septal thickening. Underlying emphysema which is partially obscured by superimposed airspace disease. There is a 2.1 x 2.6 cm nodular density posteriorly in the left upper lobe abutting the pleura, series 6, image 26, partially obscured by adjacent airspace disease. Small bilateral pleural effusions, as well as fluid tracking into the right minor fissure. No pneumothorax. Upper Abdomen: No adrenal nodule. No acute upper abdominal findings. Probable scarring in the upper left kidney. Motion obscures evaluation of the upper abdomen. Musculoskeletal: No acute osseous abnormality. No anterior rib fractures typically seen with CPR. No focal bone lesion. Review of the MIP images confirms the above findings. IMPRESSION: 1. No pulmonary embolus. 2. Multi chamber cardiomegaly with moderate circumferential pericardial effusion. Minimal contrast refluxes into the hepatic veins and IVC consistent with elevated right heart pressures. 3. Dense lower lobe consolidation, left greater than right, suspicious for aspiration. Small bilateral pleural effusions. 4. There is set the thickening in ground-glass opacities suspicious for pulmonary edema. Superimposed airspace disease within there is a ground-glass opacity may represent confluent edema or infection. 5. Shotty mediastinal and hilar adenopathy is likely reactive, but nonspecific. 6. Endotracheal tube tip is at the level of the carina, recommend retraction of 2-3 cm. 7. A 2.1 x 2.6 cm nodular density  posteriorly in the left upper lobe abutting the pleura is nonspecific given the adjacent parenchymal findings, however recommend attention at follow-up to exclude the possibility of pulmonary mass. Aortic Atherosclerosis (ICD10-I70.0) and Emphysema (ICD10-J43.9). Electronically Signed   By: Narda Rutherford M.D.   On: 04/29/2021 22:02   DG Chest Portable 1 View   Result Date: 04/29/2021 CLINICAL DATA:  Post CPR.  Cardiac arrest EXAM: PORTABLE CHEST 1 VIEW COMPARISON:  None FINDINGS: Endotracheal tube terminates 2.2 cm above carina. External pacer/defibrillator is. Midline trachea. Mild cardiomegaly. No pleural effusion or pneumothorax. Interstitial and airspace disease is relatively diffuse but greater on the left than right. IMPRESSION: Appropriate position of endotracheal tube. Cardiomegaly with left greater than right interstitial and airspace disease. Favor asymmetric pulmonary edema. Given asymmetry, aspiration is possible but felt less likely. Electronically Signed   By: Jeronimo Greaves M.D.   On: 04/29/2021 20:53   EEG adult   Result Date: 04/30/2021 Rejeana Brock, [name]     04/30/2021  2:15 AM History: 50 year old male status post cardiac arrest Sedation: Propofol Technique: This is a 21 channel routine scalp EEG performed at the bedside with bipolar and monopolar montages arranged in accordance to the international 10/20 system of electrode placement. One channel was dedicated to EKG recording. Background: The background is diffusely attenuated with ventilator artifact.  There is some degree of muscle artifact throughout most of the recording, but no definite background activity is seen.  He has several episodes of "shaking" without definite EEG change, other than significant muscle artifact.  With one of these episodes muscle artifact does significantly obscure the background, but to a degree I can tell there was no significant EEG change. Photic stimulation: Physiologic driving is not performed  EEG  Abnormalities: Diffusely attenuated background Clinical Interpretation: This EEG is severely abnormal with diffuse attenuation of the background.  There was no evidence of the muscle jerking seen was epileptiform in nature.  There was no seizure or seizure predisposition recorded on this study. Please note that lack of epileptiform activity on EEG does not preclude the possibility of epilepsy. Ritta SlotMcNeill Kirkpatrick, [name] [facility] [phone] If 7pm- 7am, please page neurology on call as listed in AMION.   Overnight EEG with video   Result Date: 04/30/2021 Charlsie QuestYadav, Priyanka O, [name]     04/30/2021  9:03 AM Patient Name: Vincent [name] MRN: [id number] Epilepsy Attending: Charlsie QuestPriyanka O Yadav Referring Physician/Provider: Rutherford Guysahul Desai, PA Duration: 04/30/2021 0202 to 04/30/2021 0900 Patient history: 50 year old male status post cardiac arrest. EEG to evaluate for seizure Level of alertness:  comatose AEDs during EEG study: LEV, propofol Technical aspects: This EEG study was done with scalp electrodes positioned according to the 10-20 International system of electrode placement. Electrical activity was acquired at a sampling rate of 500Hz  and reviewed with a high frequency filter of 70Hz  and a low frequency filter of 1Hz . EEG data were recorded continuously and digitally stored. Description: EEG showed continuous generalized background suppression.  EEG was not reactive to tactile stimulation.  Event button was pressed on 04/30/2021 at 0752 for tremors in arms and chest. Concomitant EEG before, during and after the event did not show any EEG changes suggest seizure. Hyperventilation and photic stimulation were not performed.   ABNORMALITY -Background suppression, generalized IMPRESSION: This study is suggestive of profound diffuse encephalopathy, nonspecific to etiology.  However with a history of cardiac arrest this could be secondary to anoxic/hypoxic brain injury, sedation.  No seizures or epileptiform  discharges were seen throughout the recording. Event button was pressed on 04/30/2021 at 0752 for tremors in arms and chest without concomitant EEG change. This was most likely not an epileptic event. Charlsie QuestPriyanka O Yadav        ECHOCARDIOGRAM REPORT [facility]   [facility] Date of Exam: 04/30/2021  Medical Rec #:  [id number]               Height:       68.0 in  Accession #:    [id number]              Weight:       154.5 lb  Date of Birth:  02/02/1971                BSA:          1.832 m  Patient Age:    50 years                BP:           69/54 mmHg  Patient Gender: M                       HR:           110 bpm.  Exam Location:  Inpatient   Procedure: 2D Echo, Cardiac Doppler and Color Doppler   Indications:    Cardiac Arrest I46.9     History:        Patient has no prior history of Echocardiogram  examinations.     Sonographer:    Elmarie Shileyiffany Dance  Referring Phys: [id number] RAHUL P DESAI      Sonographer Comments: Echo performed with patient supine and on artificial  respirator. Reading Cardiologist notified.  IMPRESSIONS     1. Minor contractile sparing of the lateral wall. Left ventricular  ejection fraction, by estimation, is <20%. The left ventricle has severely  decreased function. The left ventricle demonstrates global hypokinesis.  Left ventricular diastolic parameters  are consistent with Grade III diastolic dysfunction (restrictive).   2. Right ventricular systolic function is normal. The right ventricular  size is normal. There is mildly elevated pulmonary artery systolic  pressure. The estimated right ventricular systolic pressure is 40.8 mmHg.   3. A small pericardial effusion is present. The pericardial effusion is  circumferential. There is no evidence of cardiac tamponade.   4. The mitral valve is normal in structure. Trivial mitral valve  regurgitation. No evidence of mitral stenosis.   5. The aortic valve is normal in structure. Aortic valve regurgitation  is  not visualized. No aortic stenosis is present.   6. The inferior vena cava is dilated in size with <50% respiratory  variability, suggesting right atrial pressure of 15 mmHg.   FINDINGS   Left Ventricle: Minor contractile sparing of the lateral wall. Left  ventricular ejection fraction, by estimation, is <20%. The left ventricle  has severely decreased function. The left ventricle demonstrates global  hypokinesis. The left ventricular  internal cavity size was normal in size. There is no left ventricular  hypertrophy. Left ventricular diastolic parameters are consistent with  Grade III diastolic dysfunction (restrictive).   Right Ventricle: The right ventricular size is normal. No increase in  right ventricular wall thickness. Right ventricular systolic function is  normal. There is mildly elevated pulmonary artery systolic pressure. The  tricuspid regurgitant velocity is 2.54   m/s, and with an assumed right atrial pressure of 15 mmHg, the estimated  right ventricular systolic pressure is 40.8 mmHg.   Left Atrium: Left atrial size was normal in size.   Right Atrium: Right atrial size was normal in size.   Pericardium: A small pericardial effusion is present. The pericardial  effusion is circumferential. There is no evidence of cardiac tamponade.   Mitral Valve: The mitral valve is normal in structure. Trivial mitral  valve regurgitation. No evidence of mitral valve stenosis.   Tricuspid Valve: The tricuspid valve is normal in structure. Tricuspid  valve regurgitation is not demonstrated. No evidence of tricuspid  stenosis.   Aortic Valve: The aortic valve is normal in structure. Aortic valve  regurgitation is not visualized. No aortic stenosis is present.   Pulmonic Valve: The pulmonic valve was normal in structure. Pulmonic valve  regurgitation is trivial. No evidence of pulmonic stenosis.   Aorta: The aortic root is normal in size and structure.   Venous: The  inferior vena cava is dilated in size with less than 50%  respiratory variability, suggesting right atrial pressure of 15 mmHg.   IAS/Shunts: No atrial level shunt detected by color flow Doppler.     Diastology  LV e' medial:   2.08 cm/s  LV E/e' medial: 28.9    RIGHT VENTRICLE            IVC  RV Basal diam:  3.30 cm    IVC diam: 2.30 cm  RV Mid diam:    2.70 cm  RV S prime:     5.06 cm/s  TAPSE (M-mode): 0.9 cm   LEFT ATRIUM             Index       RIGHT ATRIUM           Index  LA  Vol [facility]):   50.8 ml 27.74 ml/m RA Area:     12.50 cm  LA Vol (A4C):   37.5 ml 20.47 ml/m RA Volume:   31.20 ml  17.03 ml/m  LA Biplane Vol: 44.6 ml 24.35 ml/m   AORTIC VALVE  LVOT Vmax:   30.30 cm/s  LVOT Vmean:  22.250 cm/s  LVOT VTI:    0.037 m     AORTA  Ao Asc diam: 2.70 cm   MITRAL VALVE               TRICUSPID VALVE  MV Area (PHT): 3.66 cm    TR Peak grad:   25.8 mmHg  MV Decel Time: 207 msec    TR Vmax:        254.00 cm/s  MV E velocity: 60.10 cm/s  MV A velocity: 41.70 cm/s  SHUNTS  MV E/A ratio:  1.44        Systemic VTI: 0.04 m ECHOCARDIOGRAM LIMITED  Result Date: 05/04/2021    ECHOCARDIOGRAM LIMITED REPORT   Patient Name:   [facility] Date of Exam: 05/04/2021 Medical Rec #:  [id number]               Height:       68.0 in Accession #:    [id number]              Weight:       165.8 lb Date of Birth:  [birth year]                BSA:          1.887 m Patient Age:    50 years                BP:           112/82 mmHg Patient Gender: M                       HR:           75 bpm. Exam Location:  Inpatient Procedure: Limited Echo and Cardiac Doppler Indications:    Congestive Heart Failure I50.9  History:        Patient has prior history of Echocardiogram examinations, most                 recent 04/30/2021.  Sonographer:    Celesta Gentile RCS Referring Phys: [id number] Martina Sinner  Sonographer Comments: Patient on mechanical ventilator during exam. IMPRESSIONS  1. Left ventricular  ejection fraction, by estimation, is 20 to 25%. The left ventricle has severely decreased function. There is mild asymmetric left ventricular hypertrophy of the posterior-lateral segment.  2. Right ventricular systolic function is normal. The right ventricular size is mildly enlarged.  3. Small to moderate pericardial effusion. The pericardial effusion is posterior to the left ventricle and localized near the right atrium. There is no evidence of cardiac tamponade. Comparison(s): A prior study was performed on 04/30/21. Prior images reviewed side by side. Grossly similar amount of pericardial fluid compared to prior study. FINDINGS  Left Ventricle: Left ventricular ejection fraction, by estimation, is 20 to 25%. The left ventricle has severely decreased function. There is mild asymmetric left ventricular hypertrophy of the posterior-lateral segment. Right Ventricle: The right ventricular size is mildly enlarged. Right ventricular systolic function is normal. Pericardium: Small to moderate pericardial effusion. The pericardial effusion is posterior to the left ventricle and localized near the right atrium. There is no evidence  of cardiac tamponade. LEFT VENTRICLE PLAX 2D LVIDd:         5.10 cm LVIDs:         4.40 cm LV PW:         1.10 cm LV IVS:        0.90 cm LVOT diam:     2.10 cm LVOT Area:     3.46 cm  LV Volumes (MOD) LV vol d, MOD A2C: 100.0 ml LV vol d, MOD A4C: 95.5 ml LV vol s, MOD A2C: 66.3 ml LV vol s, MOD A4C: 58.0 ml LV SV MOD A2C:     33.7 ml LV SV MOD A4C:     95.5 ml LV SV MOD BP:      36.2 ml LEFT ATRIUM         Index LA diam:    4.30 cm 2.28 cm/m   AORTA Ao Root diam: 3.20 cm  SHUNTS Systemic Diam: 2.10 cm Vincent [name] Electronically signed by Vincent [name] Signature Date/Time: 05/04/2021/5:32:46 PM    Final      Micro Data:  MRSA swab: Negative Resp Panel: COVID, FLU negative  Antimicrobials:  Cefepime: 8/2>8/2 Vanc: 8/2>8/2 Azithromax: 8/2>>8/4 Ceftriaxone: 8/3>>>  Interim  history/subjective:  Patient seen today on rounds, intubated.   Objective   Blood pressure 101/80, pulse 76, temperature 97.6 F (36.4 C), temperature source Axillary, resp. rate 18, height  (1.727 m), weight 70.6 kg, SpO2 95 %.    Vent Mode: PRVC FiO2 (%):  [40 %] 40 % Set Rate:  [18 bmp] 18 bmp Vt Set:  [550 mL] 550 mL PEEP:  [5 cmH20] 5 cmH20 Plateau Pressure:  [21 cmH20-30 cmH20] 24 cmH20   Intake/Output Summary (Last 24 hours) at 05/05/2021 0710 Last data filed at 05/05/2021 0600 Gross per 24 hour  Intake 710.69 ml  Output 3950 ml  Net -3239.31 ml   Filed Weights   05/03/21 0500 05/04/21 0500 05/05/21 0500  Weight: 77.6 kg 75.2 kg 70.6 kg    Physical Exam Constitutional:      General: He is not in acute distress.    Appearance: He is ill-appearing. He is not diaphoretic.     Comments: Intubated, does not follow verbal commands.   Cardiovascular:     Rate and Rhythm: Normal rate and regular rhythm.     Pulses: Normal pulses.     Heart sounds: Normal heart sounds. No murmur heard.   No gallop.  Pulmonary:     Effort: Pulmonary effort is normal.     Breath sounds: No wheezing or rales.  Abdominal:     General: There is distension.     Tenderness: There is no abdominal tenderness.     Comments: Diminished BS, Distended, tympanic   Neurological:     Comments: Visually tracks on the L side, postures on deep suction.      Resolved Hospital Problem list     Assessment & Plan:  Shock: Cardiogenic vs Septic PEA Cardiac Arrest:  Repeat Limited Echo showing EF of 20-25% with similar findings to the complete.  - Increase milrinone to 0.5 mcg/kg/hr - PM Coox with goal O2 saturation >50% - Restart Levophed for support - Continue Lasix 60 mg Q8H   Acute Hypoxemic Respiratory Failure Pneumonia:  - Ceftriaxone day 6/7 - Continue mechanical ventilatory support - Unable to perform PSV trials due to neurologic status   Encephalopathy:  Patient seen this morning. Non  communicative, eyes track on the left side, but  not on right. EEGs have been negative for seizure activity. His MRI on 8/6 shows no anoxic changes, but given lack of improvement when holding off continued sedation, will reach out to neurology for further recommendations on prognosis.  - Consult Neurology  - PRN Fentanyl for vent compliance   Acute Kidney Injury:  In setting of shock and heart failure, 3.3L output yesterday.  - Continue lasix to 60mg  q8hrs - sCr 2/21 from 2.12 on 8/7   Heart Failure with Reduced Ejection Fraction Pericardial Effusion:  - Lasix 60 mg Q8H  - Goal of K>4.0 and magnesium >2.0. Replete as necessary  Ileus/Bowel Obstruction: Distended abdomen, BS diminished today. Will hopefully improve with improvement of his HF exacerbation.  - Continue OG on low intermittent suction   Diabetes mellitus Type II: -SSI   Lung Mass: 2.1x2.6cm nodular density posteriorly in the left upper lobe - Will need outpatient follow up if survives this hospitalization  Anemia - Monitor  Best practice:  Diet/type: tubefeeds DVT prophylaxis: prophylactic heparin  GI prophylaxis: PPI Lines: Central line Foley: Yes, and it is still needed Code Status: full code Last date of multidisciplinary goals of care discussion [n/a] Family Communication: Updated family via phone Disposition: Pending continued medical workup   Labs   CBC: Recent Labs  Lab 04/29/21 2040 04/29/21 2052 05/01/21 0300 05/02/21 0342 05/02/21 1053 05/02/21 1822 05/03/21 0500 05/04/21 0021 05/05/21 0304  WBC 17.6*   < > 9.4 10.1  --   --  9.9 9.4 9.1  NEUTROABS 8.1*  --   --   --   --   --   --   --   --   HGB 14.1   < > 12.3* 11.7* 11.2* 10.9* 10.4* 11.0* 11.0*  HCT 46.4   < > 37.3* 36.0* 33.0* 32.0* 32.4* 33.7* 32.9*  MCV 94.3   < > 86.9 86.5  --   --  88.3 87.3 85.9  PLT 440*   < > 339 319  --   --  314 349 388   < > = values in this interval not displayed.    Basic Metabolic Panel: Recent Labs   Lab 05/01/21 0300 05/01/21 1420 05/02/21 0342 05/02/21 1053 05/02/21 1822 05/02/21 1823 05/03/21 0500 05/04/21 0021 05/05/21 0304  NA 134*   < > 137   < > 137 138 137 137 142  K 3.1*   < > 4.3   < > 4.4 4.6 4.5 3.9 3.4*  CL 103   < > 102  --   --  103 101 100 102  CO2 19*   < > 24  --   --  25 24 26 28   GLUCOSE 146*   < > 144*  --   --  169* 141* 113* 127*  BUN 27*   < > 34*  --   --  47* 57* 58* 60*  CREATININE 1.95*   < > 2.22*  --   --  2.49* 2.47* 2.12* 2.21*  CALCIUM 7.8*   < > 8.0*  --   --  8.3* 8.6* 9.1 9.0  MG 1.7  --  2.8*  --   --   --  2.9* 2.6* 2.5*  PHOS 2.4*  --  3.6  --   --   --  4.3 4.8* 4.5   < > = values in this interval not displayed.   GFR: Estimated Creatinine Clearance: 38.3 mL/min (A) (by C-G formula based on SCr of 2.21 mg/dL (H)).  Recent Labs  Lab 04/29/21 2041 04/30/21 0022 05/01/21 0300 05/02/21 0342 05/03/21 0500 05/04/21 0021 05/04/21 0939 05/05/21 0304  WBC  --  20.1*   < > 10.1 9.9 9.4  --  9.1  LATICACIDVEN 10.4* 7.1*  --   --   --   --  1.2  --    < > = values in this interval not displayed.    Liver Function Tests: Recent Labs  Lab 04/29/21 2040  AST 34  ALT 31  ALKPHOS 49  BILITOT 0.9  PROT 5.7*  ALBUMIN 3.0*   No results for input(s): LIPASE, AMYLASE in the last 168 hours. No results for input(s): AMMONIA in the last 168 hours.  ABG    Component Value Date/Time   PHART 7.494 (H) 05/02/2021 1822   PCO2ART 30.6 (L) 05/02/2021 1822   PO2ART 95 05/02/2021 1822   HCO3 23.5 05/02/2021 1822   TCO2 24 05/02/2021 1822   ACIDBASEDEF 9.0 (H) 04/30/2021 0815   O2SAT 44.5 05/05/2021 0304     Coagulation Profile: Recent Labs  Lab 04/30/21 0022  INR 1.2    Cardiac Enzymes: No results for input(s): CKTOTAL, CKMB, CKMBINDEX, TROPONINI in the last 168 hours.  HbA1C: Hgb A1c MFr Bld  Date/Time Value Ref Range Status  04/30/2021 12:22 AM 5.8 (H) 4.8 - 5.6 % Final    Comment:    (NOTE) Pre diabetes:           5.7%-6.4%  Diabetes:              >6.4%  Glycemic control for   <7.0% adults with diabetes     CBG: Recent Labs  Lab 05/04/21 1113 05/04/21 1514 05/04/21 1927 05/04/21 2315 05/05/21 0316  GLUCAP 105* 108* 110* 119* 121*    Review of Systems:   Negative with exception to above   Surgical History     Social History  Current every day smoker  Family History   Significant for MI and cardiovascular disease  Allergies No Known Allergies   Home Medications  Prior to Admission medications   Medication Sig Start Date End Date Taking? Authorizing Provider  amLODipine (NORVASC) 5 MG tablet Take 5 mg by mouth daily. 04/09/21  Yes Provider, Historical, [name]  atorvastatin (LIPITOR) 40 MG tablet Take 40 mg by mouth at bedtime. 04/09/21  Yes Provider, Historical, [name]     Dolan Amen, [name] IMTS, PGY-3 Pager: [phone] 05/05/2021,1:35 PM

## 2021-05-05 NOTE — Progress Notes (Signed)
eLink Physician-Brief Progress Note Patient Name: Vincent [name] DOB: 02-May-1971 MRN: [id number]   Date of Service  05/05/2021  HPI/Events of Note  K = 3.4. Creat stable at 2.2. On lasix IV  eICU Interventions  40 meq IV Kcl Repeat K at 10 am      Intervention Category Major Interventions: Electrolyte abnormality - evaluation and management  Oretha Milch 05/05/2021, 4:23 AM

## 2021-05-05 NOTE — Procedures (Addendum)
Patient Name: Vincent [name]  MRN: [id number]  Epilepsy Attending: Charlsie Quest  Referring Physician/Provider: Rutherford Guys, PA Date: 05/05/2021 Duration: 54.41 mins   Patient history: 50 year old male status post cardiac arrest. EEG to evaluate for seizure   Level of alertness:  comatose   AEDs during EEG study: LEV, Klonopin   Technical aspects: This EEG study was done with scalp electrodes positioned according to the 10-20 International system of electrode placement. Electrical activity was acquired at a sampling rate of 500Hz  and reviewed with a high frequency filter of 70Hz  and a low frequency filter of 1Hz . EEG data were recorded continuously and digitally stored.   Description: EEG initially showed continuous generalized 3 to 5 Hz theta-delta slowing. Hyperventilation and photic stimulation were not performed.      ABNORMALITY -Continuous slow, generalized   IMPRESSION: This study is suggestive of moderate to severe diffuse encephalopathy, nonspecific to etiology. No seizures or epileptiform discharges were seen throughout the recording.   Markey Deady 

## 2021-05-05 NOTE — Progress Notes (Addendum)
Stat  EEG complete - results pending.  

## 2021-05-05 NOTE — Procedures (Addendum)
Cortrak   Person Inserting Tube:  Vincent [name]  Tube Type:  Cortrak - 43 inches Tube Size:  10 Tube Location:  Right nare Initial Placement:  Stomach, advanced to post pyloric Secured by: Bridle Technique Used to Measure Tube Placement:  Marking at nare/corner of mouth Cortrak Secured At:  90 cm   Cortrak Tube Team Note:   Consult received to advance Cortrak to postpyloric. LDAs updated.    X-ray is required, abdominal x-ray has been ordered by the Cortrak team. Please confirm tube placement before using the Cortrak tube.   If the tube becomes dislodged please keep the tube and contact the Cortrak team at [email or web address] (password [email or web address]) for replacement. If after hours and replacement cannot be delayed, place a NG tube and confirm placement with an abdominal x-ray.

## 2021-05-05 NOTE — Progress Notes (Signed)
LTM EEG hooked up and running - no initial skin breakdown - push button tested - neuro notified. [facility] monitoring.  

## 2021-05-05 NOTE — Progress Notes (Signed)
Called again by patient RN regarding myoclonic movements. Most of these are generalized body myoclonic looking jerky seizure-like movements which are mostly stimulus induced. Good control within 15 minutes of receiving the initial Keppra but on suctioning again had another episode.  Updated recommendations: - Continuous EEG - Continue Keppra - Continue Klonopin --Next step would be due to Depakote. - If needed, sedated with propofol. I have signed out to the oncoming neuro hospitalist to review the overnight EEG-further recommendations based on the review. I will follow with you tomorrow.  Additional 30 minutes of critical care at bedside-spoke with the family, and updated the plan with the RN.   -- Milon Dikes, MD Neurologist [facility] Pager: [phone]

## 2021-05-06 ENCOUNTER — Inpatient Hospital Stay (HOSPITAL_COMMUNITY): Payer: Medicaid Other

## 2021-05-06 DIAGNOSIS — J9601 Acute respiratory failure with hypoxia: Secondary | ICD-10-CM | POA: Diagnosis not present

## 2021-05-06 DIAGNOSIS — Z9911 Dependence on respirator [ventilator] status: Secondary | ICD-10-CM | POA: Diagnosis not present

## 2021-05-06 DIAGNOSIS — E870 Hyperosmolality and hypernatremia: Secondary | ICD-10-CM

## 2021-05-06 DIAGNOSIS — N179 Acute kidney failure, unspecified: Secondary | ICD-10-CM | POA: Diagnosis not present

## 2021-05-06 DIAGNOSIS — R569 Unspecified convulsions: Secondary | ICD-10-CM

## 2021-05-06 DIAGNOSIS — I469 Cardiac arrest, cause unspecified: Secondary | ICD-10-CM | POA: Diagnosis not present

## 2021-05-06 DIAGNOSIS — G928 Other toxic encephalopathy: Secondary | ICD-10-CM | POA: Diagnosis not present

## 2021-05-06 LAB — GLUCOSE, CAPILLARY
Glucose-Capillary: 89 mg/dL (ref 70–99)
Glucose-Capillary: 98 mg/dL (ref 70–99)
Glucose-Capillary: 102 mg/dL — ABNORMAL HIGH (ref 70–99)
Glucose-Capillary: 107 mg/dL — ABNORMAL HIGH (ref 70–99)
Glucose-Capillary: 115 mg/dL — ABNORMAL HIGH (ref 70–99)
Glucose-Capillary: 114 mg/dL — ABNORMAL HIGH (ref 70–99)

## 2021-05-06 LAB — CBC
HCT: 35.3 % — ABNORMAL LOW (ref 39.0–52.0)
Hemoglobin: 11.7 g/dL — ABNORMAL LOW (ref 13.0–17.0)
MCH: 28.7 pg (ref 26.0–34.0)
MCHC: 33.1 g/dL (ref 30.0–36.0)
MCV: 86.5 fL (ref 80.0–100.0)
Platelets: 471 10*3/uL — ABNORMAL HIGH (ref 150–400)
RBC: 4.08 MIL/uL — ABNORMAL LOW (ref 4.22–5.81)
RDW: 14.2 % (ref 11.5–15.5)
WBC: 10.9 10*3/uL — ABNORMAL HIGH (ref 4.0–10.5)
nRBC: 0 % (ref 0.0–0.2)

## 2021-05-06 LAB — BASIC METABOLIC PANEL
Anion gap: 16 — ABNORMAL HIGH (ref 5–15)
BUN: 55 mg/dL — ABNORMAL HIGH (ref 6–20)
CO2: 27 mmol/L (ref 22–32)
Calcium: 9.1 mg/dL (ref 8.9–10.3)
Chloride: 103 mmol/L (ref 98–111)
Creatinine, Ser: 2.51 mg/dL — ABNORMAL HIGH (ref 0.61–1.24)
GFR, Estimated: 30 mL/min — ABNORMAL LOW (ref >60)
Glucose, Bld: 92 mg/dL (ref 70–99)
Potassium: 3.5 mmol/L (ref 3.5–5.1)
Sodium: 146 mmol/L — ABNORMAL HIGH (ref 135–145)

## 2021-05-06 LAB — TRIGLYCERIDES: Triglycerides: 163 mg/dL — ABNORMAL HIGH (ref <150)

## 2021-05-06 LAB — MAGNESIUM: Magnesium: 2.4 mg/dL (ref 1.7–2.4)

## 2021-05-06 LAB — COOXEMETRY PANEL
Carboxyhemoglobin: 1.2 % (ref 0.5–1.5)
Carboxyhemoglobin: 1 % (ref 0.5–1.5)
Methemoglobin: 1 % (ref 0.0–1.5)
Methemoglobin: 0.7 % (ref 0.0–1.5)
O2 Saturation: 85.7 %
O2 Saturation: 93.3 %
Total hemoglobin: 12.8 g/dL (ref 12.0–16.0)
Total hemoglobin: 12.6 g/dL (ref 12.0–16.0)

## 2021-05-06 MED ORDER — POTASSIUM CHLORIDE 10 MEQ/50ML IV SOLN
10.0000 meq | INTRAVENOUS | Status: AC
Start: 2021-05-06 — End: 2021-05-06
  Administered 2021-05-06 (×3): 10 meq via INTRAVENOUS
  Filled 2021-05-06 (×3): qty 50

## 2021-05-06 MED ORDER — VITAL HIGH PROTEIN PO LIQD: 1000.0000 mL | ORAL | Status: DC

## 2021-05-06 MED ORDER — FREE WATER
200.0000 mL | Status: DC
  Administered 2021-05-06 – 2021-05-12 (×35): 200 mL

## 2021-05-06 MED ORDER — PROPOFOL 1000 MG/100ML IV EMUL: 5.0000 ug/kg/min | INTRAVENOUS | Status: DC

## 2021-05-06 MED ORDER — ALTEPLASE 2 MG IJ SOLR
2.0000 mg | Freq: Once | INTRAMUSCULAR | Status: AC
  Administered 2021-05-06: 2 mg
  Filled 2021-05-06: qty 2

## 2021-05-06 MED ORDER — VALPROATE SODIUM 100 MG/ML IV SOLN
1400.0000 mg | Freq: Once | INTRAVENOUS | Status: AC
  Administered 2021-05-06: 1400 mg via INTRAVENOUS
  Filled 2021-05-06: qty 14

## 2021-05-06 MED ORDER — MILRINONE LACTATE IN DEXTROSE 20-5 MG/100ML-% IV SOLN
0.3750 ug/kg/min | INTRAVENOUS | Status: DC
  Filled 2021-05-06: qty 100

## 2021-05-06 MED ORDER — VALPROIC ACID 250 MG/5ML PO SOLN: 250.0000 mg | Freq: Four times a day (QID) | ORAL | Status: DC

## 2021-05-06 MED ORDER — VALPROATE SODIUM 100 MG/ML IV SOLN
15.0000 mg/kg/d | Freq: Four times a day (QID) | INTRAVENOUS | Status: DC
  Administered 2021-05-06: 265 mg via INTRAVENOUS
  Filled 2021-05-06 (×3): qty 2.65

## 2021-05-06 MED ORDER — MILRINONE LACTATE IN DEXTROSE 20-5 MG/100ML-% IV SOLN
0.2500 ug/kg/min | INTRAVENOUS | Status: DC
  Administered 2021-05-06: 0.25 ug/kg/min via INTRAVENOUS
  Filled 2021-05-06 (×2): qty 100

## 2021-05-06 MED ORDER — VALPROIC ACID 250 MG/5ML PO SOLN
250.0000 mg | Freq: Four times a day (QID) | ORAL | Status: DC
  Filled 2021-05-06: qty 5

## 2021-05-06 MED ORDER — VALPROATE SODIUM 100 MG/ML IV SOLN
15.0000 mg/kg/d | Freq: Four times a day (QID) | INTRAVENOUS | Status: DC
  Filled 2021-05-06 (×2): qty 2.65

## 2021-05-06 MED ORDER — VITAL AF 1.2 CAL PO LIQD
1000.0000 mL | ORAL | Status: DC
  Administered 2021-05-06: 1000 mL

## 2021-05-06 MED ORDER — PROSOURCE TF PO LIQD: 10.0000 mL | Freq: Two times a day (BID) | ORAL | Status: DC

## 2021-05-06 NOTE — Plan of Care (Signed)
EEG artifact vs. Potential seizure activity initiating in lead T8 (F8-T8 and T8-P8) as at times there does appear to be potential evolution though there is also significant EMG artifact.  Occassional Vfib vs. Artifact in EKG channel  Difficult to verify given video will not load at this time.  Will start depakote, may be stopped if epileptologist's read is negative for seizures.   Brooke Dare MD-PhD [facility] [phone]  Available 7 PM to 7 AM, outside of these hours please call Neurologist on call as listed on Amion.

## 2021-05-06 NOTE — Progress Notes (Signed)
eLink Physician-Brief Progress Note Patient Name: Najae Demeco Ducksworth DOB: 06-13-1971 MRN: [id number]   Date of Service  05/06/2021  HPI/Events of Note  Hypokalemia - K+ = 3.5 and Creatinine = 2.51.   eICU Interventions  Will replace K+.     Intervention Category Major Interventions: Electrolyte abnormality - evaluation and management  Avani Sensabaugh [place] 05/06/2021, 6:44 AM

## 2021-05-06 NOTE — Progress Notes (Signed)
Notified atrium. No skin breakdown.

## 2021-05-06 NOTE — Progress Notes (Signed)
Initial Nutrition Assessment  DOCUMENTATION CODES:   Not applicable  INTERVENTION:   Initiate tube feeding via Cortrak: Vital AF 1.2 at 20 ml/h. When TF tolerance established, increase by 10 ml every 4 hours to goal rate of 65 ml/h (1560 ml per day)  Provides 1872 kcal, 117 gm protein, 1265 ml free water daily.  NUTRITION DIAGNOSIS:   Inadequate oral intake related to inability to eat as evidenced by NPO status.  Ongoing   GOAL:   Patient will meet greater than or equal to 90% of their needs  Progressing   MONITOR:   Vent status, Labs, TF tolerance  REASON FOR ASSESSMENT:   Ventilator, Consult Enteral/tube feeding initiation and management  ASSESSMENT:   50 yo male admitted S/P PEA cardiac arrest at home. PMH includes tobacco abuse, emphysema, CVA, diabetes.  Discussed patient in ICU rounds and with RN today. Patient having myoclonic jerking with stimulation.  He is unresponsive, not on sedation.  EEG this morning showed potential seizures.   Received MD Consult for TF initiation and management. TF has been on hold since 8/6 d/t emesis. OG output: 1,000 ml 8/6; 500 ml 8/7; 160 ml 8/8.  Cortrak tube was advanced yesterday, tip is now beyond the ligament of Treitz. Will start TF at trickle rate and monitor for tolerance before advancing to goal rate.   Patient remains intubated on ventilator support MV: 10.1 L/min Temp (24hrs), Avg:99 F (37.2 C), Min:97.88 F (36.6 C), Max:100.22 F (37.9 C)  Propofol: off  Labs reviewed. Na 146, BUN 55, creat 2.51 CBG: 89-98  Medications reviewed and include novolog, protonix, miralax.  Admission weight 70 kg  Current weight 67 kg  I/O +3.6 L since admission  Diet Order:   Diet Order     None       EDUCATION NEEDS:   Not appropriate for education at this time  Skin:  Skin Assessment: Reviewed RN Assessment  Last BM:  8/8 type 7  Height:   Ht Readings from Last 1 Encounters:  04/29/21 5\' 8"  (1.727  m)    Weight:   Wt Readings from Last 1 Encounters:  05/06/21 67 kg    Ideal Body Weight:  70 kg  BMI:  Body mass index is 22.46 kg/m.  Estimated Nutritional Needs:   Kcal:  1850  Protein:  110-125 gm  Fluid:  >/= 1.9 L    07/06/21, RD, LDN, CNSC Please refer to Amion for contact information.

## 2021-05-06 NOTE — Progress Notes (Signed)
NAME:  Vincent [name], MRN:  [id number], DOB:  10-31-1970, LOS: 7 ADMISSION DATE:  04/29/2021, CONSULTATION DATE:  04/29/21 REFERRING MD:  Dr. Jacqulyn Bath, CHIEF COMPLAINT:  Found Down   History of present illness   50 y/o M, admitted to ICU after PEA arrest, likely respiratory driven.  ED note reviewed.  History unobtainable for patient as he is intubated and sedated.  History per chart and sister at bedside.  Patient was not acting right, relatively unresponsive, EMS called.  Found to be agonal breathing.  Unclear initial pulse ox was.  Eventually lost pulse.  CPR was initiated.  1 dose of epi.  About 6 months CPR.  ROSC obtained.  King airway placed.  Transported to the ED.  King airway exchanged for ET tube.  Noted to be hypotensive so norepinephrine started peripherally.  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  CTA PE protocol monitor patient reveals no PE, significant bilateral airspace disease was dense consolidations in the lower lobes, significant emphysema in upper lobes with interstitial thickening felt to be most consistent with pneumonitis/infection versus volume overload.  Moderate pericardial effusion noted by the radiologist.  Other findings as below.  He was given broad-spectrum antibiotics.  Initial lactate over 10.  1 L crystalloid given.  At time of evaluation he is on fentanyl drip and propofol.  Past Medical History  Tobacco Use Emphysema CVA DM  Significant Hospital Events   8/2: PEA Arrest, King airway in the field, CPR x6 minutes and epi x1, ROSC, Intubated in ED, Admitted to PCCM  8/4: Weaned off TTM, Coox O2 sat of 47%, started on milrinone and lasix 8/5 milrinone increased 8/6 TF stopped, OG placed to suction for ileus 8/8 Post pyloric Cortrak placed, myoclonic jerking, started Keppra, depakote  Procedures:  [facility]: 8/3 Cortrak: 8/8  Significant Diagnostic Tests:   CT HEAD WO CONTRAST ( )   Result Date: 04/29/2021 CLINICAL DATA:  Mental status change  EXAM: CT HEAD WITHOUT CONTRAST TECHNIQUE: Contiguous axial images were obtained from the base of the skull through the vertex without intravenous contrast. COMPARISON:  None. FINDINGS: Brain: No acute territorial infarction, hemorrhage or intracranial mass is visualized. Chronic right MCA infarct with extensive encephalomalacia involving the right frontal, parietal and temporal lobes as well as the right thalamus and basal ganglia. Moderate atrophy. Ex vacuo dilatation of right lateral ventricle. Atrophy of right brainstem. Vascular: No hyperdense vessels.  Carotid vascular calcification Skull: Normal. Negative for fracture or focal lesion. Sinuses/Orbits: Mucosal thickening in the sinuses. Chronic appearing deformity of the medial wall right orbit. Other: Incomplete fusion posterior arch of C1 IMPRESSION: 1. No definite CT evidence for acute intracranial abnormality. 2. Atrophy and chronic right MCA infarct. Electronically Signed   By: Jasmine Pang M.D.   On: 04/29/2021 22:09   CT Angio Chest PE W and/or Wo Contrast   Result Date: 04/29/2021 CLINICAL DATA:  PE suspected, high prob Post CPR. EXAM: CT ANGIOGRAPHY CHEST WITH CONTRAST TECHNIQUE: Multidetector CT imaging of the chest was performed using the standard protocol during bolus administration of intravenous contrast. Multiplanar CT image reconstructions and MIPs were obtained to evaluate the vascular anatomy. CONTRAST:  26mL OMNIPAQUE IOHEXOL 350 MG/ML SOLN COMPARISON:  Chest radiograph earlier today. FINDINGS: Cardiovascular: There are no filling defects within the pulmonary arteries to suggest pulmonary embolus. Mild aortic atherosclerosis. Cannot assess for dissection given phase of contrast tailored to pulmonary arteries S1. Multi chamber cardiomegaly. Minimal contrast refluxes into the hepatic veins and IVC. Moderate  size circumferential pericardial effusion. This measures up to 17 mm in depth adjacent to the right ventricle. Mediastinum/Nodes: Shotty  mediastinal adenopathy, including right anterior paratracheal node measuring 10 mm, series 5, image 37. Bilateral hilar lymph nodes measuring 9-10 mm. The esophagus is decompressed by enteric tube. No visualized thyroid nodule. Lungs/Pleura: The endotracheal tube tip is at the level of the carina, recommend retraction of 2-3 cm. Dense lower lobe consolidation, left greater than right, suspicious for aspiration. There additional patchy, ground-glass and confluent airspace opacities throughout both lungs. Mild smooth septal thickening. Underlying emphysema which is partially obscured by superimposed airspace disease. There is a 2.1 x 2.6 cm nodular density posteriorly in the left upper lobe abutting the pleura, series 6, image 26, partially obscured by adjacent airspace disease. Small bilateral pleural effusions, as well as fluid tracking into the right minor fissure. No pneumothorax. Upper Abdomen: No adrenal nodule. No acute upper abdominal findings. Probable scarring in the upper left kidney. Motion obscures evaluation of the upper abdomen. Musculoskeletal: No acute osseous abnormality. No anterior rib fractures typically seen with CPR. No focal bone lesion. Review of the MIP images confirms the above findings. IMPRESSION: 1. No pulmonary embolus. 2. Multi chamber cardiomegaly with moderate circumferential pericardial effusion. Minimal contrast refluxes into the hepatic veins and IVC consistent with elevated right heart pressures. 3. Dense lower lobe consolidation, left greater than right, suspicious for aspiration. Small bilateral pleural effusions. 4. There is set the thickening in ground-glass opacities suspicious for pulmonary edema. Superimposed airspace disease within there is a ground-glass opacity may represent confluent edema or infection. 5. Shotty mediastinal and hilar adenopathy is likely reactive, but nonspecific. 6. Endotracheal tube tip is at the level of the carina, recommend retraction of 2-3 cm.  7. A 2.1 x 2.6 cm nodular density posteriorly in the left upper lobe abutting the pleura is nonspecific given the adjacent parenchymal findings, however recommend attention at follow-up to exclude the possibility of pulmonary mass. Aortic Atherosclerosis (ICD10-I70.0) and Emphysema (ICD10-J43.9). Electronically Signed   By: Melanie  Sanford M.D.   On: 08/Narda Rutherford02/2022 22:02   DG Chest Portable 1 View   Result Date: 04/29/2021 CLINICAL DATA:  Post CPR.  Cardiac arrest EXAM: PORTABLE CHEST 1 VIEW COMPARISON:  None FINDINGS: Endotracheal tube terminates 2.2 cm above carina. External pacer/defibrillator is. Midline trachea. Mild cardiomegaly. No pleural effusion or pneumothorax. Interstitial and airspace disease is relatively diffuse but greater on the left than right. IMPRESSION: Appropriate position of endotracheal tube. Cardiomegaly with left greater than right interstitial and airspace disease. Favor asymmetric pulmonary edema. Given asymmetry, aspiration is possible but felt less likely. Electronically Signed   By: Jeronimo GreavesKyle  Talbot M.D.   On: 04/29/2021 20:53   EEG adult   Result Date: 04/30/2021 Rejeana BrockKirkpatrick, McNeill P, MD     04/30/2021  2:15 AM History: 50 year old male status post cardiac arrest Sedation: Propofol Technique: This is a 21 channel routine scalp EEG performed at the bedside with bipolar and monopolar montages arranged in accordance to the international 10/20 system of electrode placement. One channel was dedicated to EKG recording. Background: The background is diffusely attenuated with ventilator artifact.  There is some degree of muscle artifact throughout most of the recording, but no definite background activity is seen.  He has several episodes of "shaking" without definite EEG change, other than significant muscle artifact.  With one of these episodes muscle artifact does significantly obscure the background, but to a degree I can tell there was no significant EEG change. Photic stimulation:  Physiologic driving is not performed EEG Abnormalities: Diffusely attenuated background Clinical Interpretation: This EEG is severely abnormal with diffuse attenuation of the background.  There was no evidence of the muscle jerking seen was epileptiform in nature.  There was no seizure or seizure predisposition recorded on this study. Please note that lack of epileptiform activity on EEG does not preclude the possibility of epilepsy. Ritta Slot, MD [facility] [phone] If 7pm- 7am, please page neurology on call as listed in AMION.   Overnight EEG with video   Result Date: 04/30/2021 Charlsie Quest, MD     04/30/2021  9:03 AM Patient Name: Vincent [name] MRN: [id number] Epilepsy Attending: Charlsie Quest Referring Physician/Provider: Rutherford Guys, PA Duration: 04/30/2021 0202 to 04/30/2021 0900 Patient history: 50 year old male status post cardiac arrest. EEG to evaluate for seizure Level of alertness:  comatose AEDs during EEG study: LEV, propofol Technical aspects: This EEG study was done with scalp electrodes positioned according to the 10-20 International system of electrode placement. Electrical activity was acquired at a sampling rate of 500Hz  and reviewed with a high frequency filter of 70Hz  and a low frequency filter of 1Hz . EEG data were recorded continuously and digitally stored. Description: EEG showed continuous generalized background suppression.  EEG was not reactive to tactile stimulation.  Event button was pressed on 04/30/2021 at 0752 for tremors in arms and chest. Concomitant EEG before, during and after the event did not show any EEG changes suggest seizure. Hyperventilation and photic stimulation were not performed.   ABNORMALITY -Background suppression, generalized IMPRESSION: This study is suggestive of profound diffuse encephalopathy, nonspecific to etiology.  However with a history of cardiac arrest this could be secondary to anoxic/hypoxic brain injury,  sedation.  No seizures or epileptiform discharges were seen throughout the recording. Event button was pressed on 04/30/2021 at 0752 for tremors in arms and chest without concomitant EEG change. This was most likely not an epileptic event.        ECHOCARDIOGRAM REPORT Patient Name:   [facility] Date of Exam: 04/30/2021  Medical Rec #:  [facility]               Height:       68.0 in  Accession #:    [facility]              Weight:       154.5 lb  Date of Birth:  1971-03-26                BSA:          1.832 m  Patient Age:    50 years                BP:           69/54 mmHg  Patient Gender: M                       HR:           110 bpm.  Exam Location:  Inpatient   Procedure: 2D Echo, Cardiac Doppler and Color Doppler   Indications:    Cardiac Arrest I46.9     History:        Patient has no prior history of Echocardiogram  examinations.     Sonographer:    [id number] Dance  Referring Phys: [id number] RAHUL P DESAI      Sonographer Comments: Echo performed with patient supine and on artificial  respirator. Reading Cardiologist notified.  IMPRESSIONS     1. Minor contractile sparing of the lateral wall. Left ventricular  ejection fraction, by estimation, is <20%. The left ventricle has severely  decreased function. The left ventricle demonstrates global hypokinesis.  Left ventricular diastolic parameters  are consistent with Grade III diastolic dysfunction (restrictive).   2. Right ventricular systolic function is normal. The right ventricular  size is normal. There is mildly elevated pulmonary artery systolic  pressure. The estimated right ventricular systolic pressure is 40.8 mmHg.   3. A small pericardial effusion is present. The pericardial effusion is  circumferential. There is no evidence of cardiac tamponade.   4. The mitral valve is normal in structure. Trivial mitral valve  regurgitation. No evidence of mitral stenosis.   5. The aortic valve is normal in  structure. Aortic valve regurgitation is  not visualized. No aortic stenosis is present.   6. The inferior vena cava is dilated in size with <50% respiratory  variability, suggesting right atrial pressure of 15 mmHg.   FINDINGS   Left Ventricle: Minor contractile sparing of the lateral wall. Left  ventricular ejection fraction, by estimation, is <20%. The left ventricle  has severely decreased function. The left ventricle demonstrates global  hypokinesis. The left ventricular  internal cavity size was normal in size. There is no left ventricular  hypertrophy. Left ventricular diastolic parameters are consistent with  Grade III diastolic dysfunction (restrictive).   Right Ventricle: The right ventricular size is normal. No increase in  right ventricular wall thickness. Right ventricular systolic function is  normal. There is mildly elevated pulmonary artery systolic pressure. The  tricuspid regurgitant velocity is 2.54   m/s, and with an assumed right atrial pressure of 15 mmHg, the estimated  right ventricular systolic pressure is 40.8 mmHg.   Left Atrium: Left atrial size was normal in size.   Right Atrium: Right atrial size was normal in size.   Pericardium: A small pericardial effusion is present. The pericardial  effusion is circumferential. There is no evidence of cardiac tamponade.   Mitral Valve: The mitral valve is normal in structure. Trivial mitral  valve regurgitation. No evidence of mitral valve stenosis.   Tricuspid Valve: The tricuspid valve is normal in structure. Tricuspid  valve regurgitation is not demonstrated. No evidence of tricuspid  stenosis.   Aortic Valve: The aortic valve is normal in structure. Aortic valve  regurgitation is not visualized. No aortic stenosis is present.   Pulmonic Valve: The pulmonic valve was normal in structure. Pulmonic valve  regurgitation is trivial. No evidence of pulmonic stenosis.   Aorta: The aortic root is normal in size  and structure.   Venous: The inferior vena cava is dilated in size with less than 50%  respiratory variability, suggesting right atrial pressure of 15 mmHg.   IAS/Shunts: No atrial level shunt detected by color flow Doppler.     Diastology  LV e' medial:   2.08 cm/s  LV E/e' medial: 28.9    RIGHT VENTRICLE            IVC  RV Basal diam:  3.30 cm    IVC diam: 2.30 cm  RV Mid diam:    2.70 cm  RV S prime:     5.06 cm/s  TAPSE (M-mode): 0.9 cm   LEFT ATRIUM             Index       RIGHT ATRIUM  Index  LA Vol (A2C):   50.8 ml 27.74 ml/m RA Area:     12.50 cm  LA Vol (A4C):   37.5 ml 20.47 ml/m RA Volume:   31.20 ml  17.03 ml/m  LA Biplane Vol: 44.6 ml 24.35 ml/m   AORTIC VALVE  LVOT Vmax:   30.30 cm/s  LVOT Vmean:  22.250 cm/s  LVOT VTI:    0.037 m     AORTA  Ao Asc diam: 2.70 cm   MITRAL VALVE               TRICUSPID VALVE  MV Area (PHT): 3.66 cm    TR Peak grad:   25.8 mmHg  MV Decel Time: 207 msec    TR Vmax:        254.00 cm/s  MV E velocity: 60.10 cm/s  MV A velocity: 41.70 cm/s  SHUNTS  MV E/A ratio:  1.44        Systemic VTI: 0.04 m DG Abd Portable 1V  Result Date: 05/05/2021 CLINICAL DATA:  Feeding tube placement EXAM: PORTABLE ABDOMEN - 1 VIEW COMPARISON:  Portable exam 1215 hours compared to 05/03/2021 FINDINGS: Tip of nasogastric tube projects over stomach. Feeding tube traverses stomach and duodenal C-loop with tip beyond ligament of Treitz in the LEFT upper quadrant. Slight gaseous distention of RIGHT colon. IMPRESSION: Tip of feeding tube is beyond ligament of Treitz in the LEFT upper quadrant. Electronically Signed   By: Ulyses Southward M.D.   On: 05/05/2021 12:41     Micro Data:  MRSA swab: Negative Resp Panel: COVID, FLU negative  Antimicrobials:  Cefepime: 8/2>8/2 Vanc: 8/2>8/2 Azithromax: 8/2>>8/4 Ceftriaxone: 8/3>>>8/8 Augmentin: 8/8>>8/10  Interim history/subjective:  O/N Events: had myoclonic jerking ON. Loaded on Keppra, EEG negative,  continued to have events and placed on scheduled depakote.  Objective   Blood pressure 137/77, pulse (!) 103, temperature 98.06 F (36.7 C), resp. rate 18, height  (1.727 m), weight 67 kg, SpO2 94 %.    Vent Mode: PRVC FiO2 (%):  [40 %] 40 % Set Rate:  [18 bmp] 18 bmp Vt Set:  [550 mL] 550 mL PEEP:  [5 cmH20] 5 cmH20 Pressure Support:  [8 cmH20] 8 cmH20 Plateau Pressure:  [18 cmH20-24 cmH20] 18 cmH20   Intake/Output Summary (Last 24 hours) at 05/06/2021 0823 Last data filed at 05/06/2021 0600 Gross per 24 hour  Intake 487.83 ml  Output 2740 ml  Net -2252.17 ml    Filed Weights   05/04/21 0500 05/05/21 0500 05/06/21 0600  Weight: 75.2 kg 70.6 kg 67 kg     Physical Exam Constitutional:      Appearance: He is ill-appearing.     Comments: Intubated, NAD, ill appearing  Cardiovascular:     Rate and Rhythm: Normal rate and regular rhythm.     Pulses: Normal pulses.     Heart sounds: Normal heart sounds. No murmur heard.   No gallop.  Pulmonary:     Effort: No respiratory distress.     Breath sounds: No wheezing, rhonchi or rales.     Comments: Breathing comfortably on vent Abdominal:     General: There is distension.     Tenderness: There is no abdominal tenderness.     Comments: Distended, but softer on palpation compared to yesterday, increase in BS  Musculoskeletal:     Right lower leg: No edema.     Left lower leg: No edema.  Neurological:     Comments: Awake, tracks interviewer from  midline to right, unable to do so on left. Unable to follow commands     Resolved Hospital Problem list     Assessment & Plan:  Cardiogenic Shock PEA Cardiac Arrest:  Repeat Limited Echo showing EF of 20-25% with similar findings to the complete. Coox improved when off Precedex yesterday, will decrease milrinone today.  - Decrease milrinone to 0.375 mcg/kg/hr - F/U Coox panel - Levophed for support   Acute Hypoxemic Respiratory Failure Pneumonia:  Ceftriaxone switched to  Augmentin for concern of contributing to encephalopathy, had a trial sbt yesterday, which patient did not tolerate.  - Continue Augmentin - Continue mechanical ventilatory support - Continue daily SBT and SAT - VAP protocol   Encephalopathy:  Able to track on right side, but unable to follow commands. He has been weaned off propofol, Ceftriaxone discontinued due to possibly contributing to his encephalopathy. Did have myoclonic events overnight, loaded on Keppra and Depakote and EEG ordered. initial EEG showed no seizure like activity, but continuous EEG may have picked up a seizure event vs artifact.  - Appreciate Neurology's recommendation  - PRN Fentanyl for vent compliance - Continue Keppra and Depakote   Acute Kidney Injury:  In setting of cardiogenic shock and PEA arrest. 3.3L output yesterday.  - Discontinue lasix to  - sCr 2/21 from 2.12 on 8/7   Heart Failure with Reduced Ejection Fraction Pericardial Effusion:  K of 3.5, currently receiving IV K, Mg pending.  - Decreased milrinone, follow up coox panel  - Goal of K>4.0 and magnesium >2.0. Replete as necessary  Ileus/Bowel Obstruction:  Improvement with Increase in bowel sounds today, Abdomen less distended, will continue with suction. Post pyloric cortrak placed yesterday, will initiate tube feeds today.  - TF @ 40 mL/hr - Continue OG on low intermittent suction   Diabetes mellitus Type II: - Continue SSI   Lung Mass: 2.1x2.6cm nodular density posteriorly in the left upper lobe - Will need outpatient follow up if survives this hospitalization  Anemia - Monitor  Best practice:  Diet/type: tubefeeds DVT prophylaxis: prophylactic heparin  GI prophylaxis: PPI Lines: Central line Foley: Yes, and it is still needed Code Status: full code Last date of multidisciplinary goals of care discussion [n/a] Family Communication: Updated family via phone Disposition: Pending continued medical workup   Labs    CBC: Recent Labs  Lab 04/29/21 2040 04/29/21 2052 05/02/21 0342 05/02/21 1053 05/02/21 1822 05/03/21 0500 05/04/21 0021 05/05/21 0304 05/06/21 0505  WBC 17.6*   < > 10.1  --   --  9.9 9.4 9.1 10.9*  NEUTROABS 8.1*  --   --   --   --   --   --   --   --   HGB 14.1   < > 11.7*   < > 10.9* 10.4* 11.0* 11.0* 11.7*  HCT 46.4   < > 36.0*   < > 32.0* 32.4* 33.7* 32.9* 35.3*  MCV 94.3   < > 86.5  --   --  88.3 87.3 85.9 86.5  PLT 440*   < > 319  --   --  314 349 388 471*   < > = values in this interval not displayed.     Basic Metabolic Panel: Recent Labs  Lab 05/01/21 0300 05/01/21 1420 05/02/21 0342 05/02/21 1053 05/02/21 1823 05/03/21 0500 05/04/21 0021 05/05/21 0304 05/05/21 1031 05/06/21 0505  NA 134*   < > 137   < > 138 137 137 142  --  146*  K 3.1*   < > 4.3   < > 4.6 4.5 3.9 3.4* 4.5 3.5  CL 103   < > 102  --  103 101 100 102  --  103  CO2 19*   < > 24  --  --  27  GLUCOSE 146*   < > 144*  --  169* 141* 113* 127*  --  92  BUN 27*   < > 34*  --  47* 57* 58* 60*  --  55*  CREATININE 1.95*   < > 2.22*  --  2.49* 2.47* 2.12* 2.21*  --  2.51*  CALCIUM 7.8*   < > 8.0*  --  8.3* 8.6* 9.1 9.0  --  9.1  MG 1.7  --  2.8*  --   --  2.9* 2.6* 2.5*  --   --   PHOS 2.4*  --  3.6  --   --  4.3 4.8* 4.5  --   --    < > = values in this interval not displayed.    GFR: Estimated Creatinine Clearance: 33 mL/min (A) (by C-G formula based on SCr of 2.51 mg/dL (H)). Recent Labs  Lab 04/29/21 2041 04/30/21 0022 05/01/21 0300 05/03/21 0500 05/04/21 0021 05/04/21 0939 05/05/21 0304 05/06/21 0505  WBC  --  20.1*   < > 9.9 9.4  --  9.1 10.9*  LATICACIDVEN 10.4* 7.1*  --   --   --  1.2  --   --    < > = values in this interval not displayed.     Liver Function Tests: Recent Labs  Lab 04/29/21 2040  AST 34  ALT 31  ALKPHOS 49  BILITOT 0.9  PROT 5.7*  ALBUMIN 3.0*    No results for input(s): LIPASE, AMYLASE in the last 168 hours. No results for  input(s): AMMONIA in the last 168 hours.  ABG    Component Value Date/Time   PHART 7.494 (H) 05/02/2021 1822   PCO2ART 30.6 (L) 05/02/2021 1822   PO2ART 95 05/02/2021 1822   HCO3 23.5 05/02/2021 1822   TCO2 24 05/02/2021 1822   ACIDBASEDEF 9.0 (H) 04/30/2021 0815   O2SAT 85.7 05/06/2021 0510      Coagulation Profile: Recent Labs  Lab 04/30/21 0022  INR 1.2     Cardiac Enzymes: No results for input(s): CKTOTAL, CKMB, CKMBINDEX, TROPONINI in the last 168 hours.  HbA1C: Hgb A1c MFr Bld  Date/Time Value Ref Range Status  04/30/2021 12:22 AM 5.8 (H) 4.8 - 5.6 % Final    Comment:    (NOTE) Pre diabetes:          5.7%-6.4%  Diabetes:              >6.4%  Glycemic control for   <7.0% adults with diabetes     CBG: Recent Labs  Lab 05/05/21 1536 05/05/21 1923 05/05/21 2320 05/06/21 0322 05/06/21 0725  GLUCAP 97 93 84 89 98     Review of Systems:   Negative with exception to above   Surgical History     Social History  Current every day smoker  Family History   Significant for MI and cardiovascular disease  Allergies No Known Allergies   Home Medications  Prior to Admission medications   Medication Sig Start Date End Date Taking? Authorizing Provider  amLODipine (NORVASC) 5 MG tablet Take 5 mg by mouth daily. 04/09/21  Yes [provider]  atorvastatin (LIPITOR) 40  MG tablet Take 40 mg by mouth at bedtime. 04/09/21  Yes [provider]     Dolan Amen, MD [facility], PGY-3 Pager: [phone] 05/06/2021,8:23 AM

## 2021-05-06 NOTE — Progress Notes (Signed)
Neurology Progress Note  Brief HPI: 50 y.o. male with PMHx of tobacco use, emphysema, prior stroke with left-sided spastic hemiparesis, DM2 who presented on 04/29/2021 s/p PEA arrest in the field with 6 minutes of CPR prior to ROSC. Inpatient work up revealed severe cardiomyopathy with LVEF of 20%, with hospitalization complicated by an ileus, poor examination once sedation was weaned, and concern for myoclonic jerking not visualized on initial LTM EEG; re-applied 05/05/2021.   Subjective: Keppra 1g IV given 05/05/2021 with Klonipin for progressive myoclonic jerking visualized by bedside RN- LTM EEG initiated overnight with initial EEG negative for seizures or epileptigorm discharges Depakote initiated overnight for concern for seizures versus artifact  Exam: Vitals:   05/06/21 0500 05/06/21 0600  BP: 131/69 138/70  Pulse: 93 95  Resp: (!) 25 18  Temp: 98.78 F (37.1 C) 98.6 F (37 C)  SpO2: 93% 96%   Gen: Intubated, not on sedation in the ICU Resp: respirations assisted via mechanical ventilation with ETT in place, no spontaneous respirations over set vent rate during assessment Abd: soft, rounded, mildly distended  Neuro: Mental Status: Intubated, off sedation. Awake, appears alert.  Does not follow any commands.  Unable to assess orientation or speech due to patient's condition Cranial Nerves: PERRL 4 mm/brisk, prefers rightward gaze but will cross midline to constant voice stimulation on the left, does not blink to threat throughout on the left eye, blink to threat inconsistent on the right eye, corneal, cough, and gag reflexes remain intact, head is grossly midline, does not protrude tongue to command Motor: Left upper extremity is spastic with minimal withdraw to noxious stimulation, left lower extremity will withdraw to noxious stimulation with mildly increased tone. Right upper extremity has no movement spontaneously or with application of noxious stimulation Right lower extremity has  minimal and very inconsistent withdraw to noxious stimulation.   Sensory: Patient grimaces with application of noxious stimulation on the right without withdraw, there is no grimace with application of noxious stimulation on the left upper or lower extremity but he does withdraw ** DTR: Right patellae 2+, left patellae 3+, left ankle dorsiflexion with sustained clonus Gait: Deferred  Pertinent Labs: CBC    Component Value Date/Time   WBC 10.9 (H) 05/06/2021 0505   RBC 4.08 (L) 05/06/2021 0505   HGB 11.7 (L) 05/06/2021 0505   HCT 35.3 (L) 05/06/2021 0505   PLT 471 (H) 05/06/2021 0505   MCV 86.5 05/06/2021 0505   MCH 28.7 05/06/2021 0505   MCHC 33.1 05/06/2021 0505   RDW 14.2 05/06/2021 0505   LYMPHSABS 8.1 (H) 04/29/2021 2040   MONOABS 0.7 04/29/2021 2040   EOSABS 0.2 04/29/2021 2040   BASOSABS 0.1 04/29/2021 2040   CMP     Component Value Date/Time   NA 146 (H) 05/06/2021 0505   K 3.5 05/06/2021 0505   CL 103 05/06/2021 0505   CO2 27 05/06/2021 0505   GLUCOSE 92 05/06/2021 0505   BUN 55 (H) 05/06/2021 0505   CREATININE 2.51 (H) 05/06/2021 0505   CALCIUM 9.1 05/06/2021 0505   PROT 5.7 (L) 04/29/2021 2040   ALBUMIN 3.0 (L) 04/29/2021 2040   AST 34 04/29/2021 2040   ALT 31 04/29/2021 2040   ALKPHOS 49 04/29/2021 2040   BILITOT 0.9 04/29/2021 2040   GFRNONAA 30 (L) 05/06/2021 0505   Imaging Reviewed: I have reviewed the images obtained:  MRI brain 05/04/2021 with no acute changes.  No evidence of hypoxic or anoxic injury.  Large area of encephalomalacia in the  right MCA territory presumably from the old stroke.  EEG 05/05/2021: "This study is suggestive of moderate to severe diffuse encephalopathy, nonspecific to etiology. No seizures or epileptiform discharges were seen throughout the recording."  Overnight EEG 05/05/2021 - 05/06/2021: ABNORMALITY -Continuous slow, generalized   IMPRESSION: This study is suggestive of moderate to severe diffuse encephalopathy,  nonspecific to etiology.  Multiple events were recorded during which patient had sudden bilateral upper extremity jerking without concomitant EEG change.  These were most likely nonepileptic events.  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  Assessment: 50 year old with PMHx of tobacco abuse, emphysema, prior stroke with left-sided spastic hemiparesis, and DM2 admitted after PEA arrest and found to have an ejection fraction of 20%. - Remains quite encephalopathic, most likely due to prolonged effect of sedation as well as due to poor brain reserve given prior RMCA stroke. - Now with sedation off since 05/05/2021 AM but does not follow any commands on assessment today. He also does not withdraw the right upper or lower extremity to noxious stimuli.  - There might be a component of mild hypoxic ischemic encephalopathy but his imaging is not consistent with anoxic brain injury. He is also on ceftriaxone-sometimes cephalosporin toxicity can cloud the picture as well. - RN with concern for myoclonic movements overnight 05/05/2021 - 05/06/2021. They occur with stimulation, especially suctioning, and typically resolve with removal of stimulation. The jerking involves the left > right upper extremity vigorous jerking. RN at bedside yesterday with concern for prolonged myoclonic movements that resolved with Keppra and Klonopin. LTM was applied and initial report was without seizures or epileptiform discharges. With ongoing concern for myoclonus overnight, he was started on Depakote. On reassessment 05/06/2021 AM, myoclonic jerking present during suctioning for 1-2 seconds before resolution. Myoclonus seen on video EEG overnight was without concomitant EEG change. Presentation is most consistent with post hypoxia stimulus-induced myoclonus and not consistent with seizure activity. Scheduled Keppra and Klonopin PRN will be continued for management of stimulus-induced myoclonus.    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 Freeman Caldron diffuse encephalopathy nonspecific etiology.  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 At this time, continue Keppra, continue Klonopin as needed.  I would hold off on the Depakote.  Can consider adding it if the frequency of the stimulus induced myoclonus becomes a problem. Exam essentially unchanged from yesterday. Discussed with Dr. Chestine Spore. I discussed with the sister-explained to her that he probably has some amount of hypoxic brain damage although not overt anoxic brain injury that would be seen on the MRI and the prognosis is difficult and any recovery that we might see might be very prolonged and given his poor brain reserve, there are good chances that he might never be  able to come back to an independent state of living. I did introduce the conversation about CODE STATUS, but at this point, the patient's sister would like to gather all the information and process it and have more conversations in the days to come. She is coming to visit  him tomorrow-around 3:30 PM, I also plan to meet her then as well as Dr. Chestine Spore.   -- Milon Dikes, MD Neurologist [facility] Pager: [phone]   CRITICAL CARE ATTESTATION Performed by: Milon Dikes, MD Total critical care time: 44 minutes Critical care time was exclusive of separately billable procedures and treating other patients and/or supervising APPs/Residents/Students Critical care was necessary to treat or prevent imminent or life-threatening deterioration due to post hypoxic stimulus induced myoclonus, concern for seizures versus status epilepticus. This patient is critically ill and at significant risk for neurological worsening and/or death and care requires constant monitoring. Critical care was time spent personally by me on the following activities: development of treatment plan with patient and/or surrogate as well as nursing, discussions with consultants, evaluation of patient's response to treatment, examination of patient, obtaining history from patient or surrogate, ordering and performing treatments and interventions, ordering and review of laboratory studies, ordering and review of radiographic studies, pulse oximetry, re-evaluation of patient's condition, participation in multidisciplinary rounds and medical decision making of high complexity in the care of this patient.

## 2021-05-06 NOTE — Procedures (Addendum)
Patient Name: Vincent [name]  MRN: [id number]  Epilepsy Attending: Charlsie Quest  Referring Physician/Provider: Lanae Boast, NP Duration: 05/05/2021 2109 to 05/06/2021 1222   Patient history: 50 year old male status post cardiac arrest. EEG to evaluate for seizure   Level of alertness:  comatose   AEDs during EEG study: LEV, Klonopin   Technical aspects: This EEG study was done with scalp electrodes positioned according to the 10-20 International system of electrode placement. Electrical activity was acquired at a sampling rate of 500Hz  and reviewed with a high frequency filter of 70Hz  and a low frequency filter of 1Hz . EEG data were recorded continuously and digitally stored.   Description: EEG showed continuous generalized 3 to 5 Hz theta-delta slowing.  Multiple events were recorded. During the events, patient was noted to have sudden bilateral upper extremity jerking. Concomitant EEG before, during and after the event did not show any significant myogenic artifact but no definite EEG change was noted suggest seizure.  Hyperventilation and photic stimulation were not performed.      ABNORMALITY -Continuous slow, generalized   IMPRESSION: This study is suggestive of moderate to severe diffuse encephalopathy, nonspecific to etiology.  Multiple events were recorded during which patient had sudden bilateral upper extremity jerking without concomitant EEG change.  These were most likely nonepileptic events.  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   Natassia Guthridge 

## 2021-05-07 DIAGNOSIS — J9601 Acute respiratory failure with hypoxia: Secondary | ICD-10-CM | POA: Diagnosis not present

## 2021-05-07 DIAGNOSIS — N179 Acute kidney failure, unspecified: Secondary | ICD-10-CM | POA: Diagnosis not present

## 2021-05-07 DIAGNOSIS — G253 Myoclonus: Secondary | ICD-10-CM | POA: Diagnosis not present

## 2021-05-07 DIAGNOSIS — I469 Cardiac arrest, cause unspecified: Secondary | ICD-10-CM | POA: Diagnosis not present

## 2021-05-07 DIAGNOSIS — G928 Other toxic encephalopathy: Secondary | ICD-10-CM | POA: Diagnosis not present

## 2021-05-07 LAB — CBC
HCT: 35.9 % — ABNORMAL LOW (ref 39.0–52.0)
Hemoglobin: 11.6 g/dL — ABNORMAL LOW (ref 13.0–17.0)
MCH: 28.3 pg (ref 26.0–34.0)
MCHC: 32.3 g/dL (ref 30.0–36.0)
MCV: 87.6 fL (ref 80.0–100.0)
Platelets: 430 10*3/uL — ABNORMAL HIGH (ref 150–400)
RBC: 4.1 MIL/uL — ABNORMAL LOW (ref 4.22–5.81)
RDW: 14.1 % (ref 11.5–15.5)
WBC: 9 10*3/uL (ref 4.0–10.5)
nRBC: 0 % (ref 0.0–0.2)

## 2021-05-07 LAB — BASIC METABOLIC PANEL
Anion gap: 13 (ref 5–15)
BUN: 44 mg/dL — ABNORMAL HIGH (ref 6–20)
CO2: 25 mmol/L (ref 22–32)
Calcium: 8.4 mg/dL — ABNORMAL LOW (ref 8.9–10.3)
Chloride: 107 mmol/L (ref 98–111)
Creatinine, Ser: 2.22 mg/dL — ABNORMAL HIGH (ref 0.61–1.24)
GFR, Estimated: 35 mL/min — ABNORMAL LOW (ref >60)
Glucose, Bld: 103 mg/dL — ABNORMAL HIGH (ref 70–99)
Potassium: 3 mmol/L — ABNORMAL LOW (ref 3.5–5.1)
Sodium: 145 mmol/L (ref 135–145)

## 2021-05-07 LAB — POTASSIUM: Potassium: 4.1 mmol/L (ref 3.5–5.1)

## 2021-05-07 LAB — GLUCOSE, CAPILLARY
Glucose-Capillary: 110 mg/dL — ABNORMAL HIGH (ref 70–99)
Glucose-Capillary: 110 mg/dL — ABNORMAL HIGH (ref 70–99)
Glucose-Capillary: 112 mg/dL — ABNORMAL HIGH (ref 70–99)
Glucose-Capillary: 112 mg/dL — ABNORMAL HIGH (ref 70–99)
Glucose-Capillary: 115 mg/dL — ABNORMAL HIGH (ref 70–99)
Glucose-Capillary: 124 mg/dL — ABNORMAL HIGH (ref 70–99)

## 2021-05-07 LAB — COOXEMETRY PANEL
Carboxyhemoglobin: 1.1 % (ref 0.5–1.5)
Methemoglobin: 0.8 % (ref 0.0–1.5)
O2 Saturation: 78.7 %
Total hemoglobin: 12.9 g/dL (ref 12.0–16.0)

## 2021-05-07 LAB — MAGNESIUM: Magnesium: 2.7 mg/dL — ABNORMAL HIGH (ref 1.7–2.4)

## 2021-05-07 LAB — PHOSPHORUS: Phosphorus: 3.7 mg/dL (ref 2.5–4.6)

## 2021-05-07 MED ORDER — MILRINONE LACTATE IN DEXTROSE 20-5 MG/100ML-% IV SOLN: 0.1250 ug/kg/min | INTRAVENOUS | Status: DC

## 2021-05-07 MED ORDER — POTASSIUM CHLORIDE 10 MEQ/50ML IV SOLN
10.0000 meq | INTRAVENOUS | Status: AC
Start: 2021-05-07 — End: 2021-05-07
  Administered 2021-05-07 (×3): 10 meq via INTRAVENOUS
  Filled 2021-05-07 (×4): qty 50

## 2021-05-07 MED ORDER — SODIUM CHLORIDE 0.9 % IV SOLN
12.5000 mg | Freq: Four times a day (QID) | INTRAVENOUS | Status: DC | PRN
  Administered 2021-05-07: 12.5 mg via INTRAVENOUS
  Filled 2021-05-07 (×2): qty 0.5

## 2021-05-07 MED ORDER — MILRINONE LACTATE IN DEXTROSE 20-5 MG/100ML-% IV SOLN
0.1250 ug/kg/min | INTRAVENOUS | Status: DC
  Administered 2021-05-07 (×2): 0.125 ug/kg/min via INTRAVENOUS
  Filled 2021-05-07 (×2): qty 100

## 2021-05-07 MED ORDER — VITAL AF 1.2 CAL PO LIQD
1000.0000 mL | ORAL | Status: DC
  Administered 2021-05-07 – 2021-05-08 (×2): 1000 mL
  Filled 2021-05-07: qty 1000

## 2021-05-07 MED ORDER — POTASSIUM CHLORIDE 20 MEQ PO PACK
40.0000 meq | PACK | Freq: Once | ORAL | Status: AC
  Administered 2021-05-07: 40 meq
  Filled 2021-05-07: qty 2

## 2021-05-07 NOTE — Progress Notes (Addendum)
I met with Mr. Beutler sister Maree Krabbe and Dr. Rory Percy to discuss his ongoing care. He would want a prolonged time for recovery and would be ok with potentially being in a nursing home on a vent to be alive. His baseline was limited previously where he required assistance with his daily care and sometimes used a wheelchair. His sister would not want him to be resuscitated in the event of a cardiac arrest, but we will continue all aggressive care. DNR order updated in epic. Bedside RN aware.  Julian Hy, DO 05/07/21 4:31 PM [facility]

## 2021-05-07 NOTE — TOC Initial Note (Addendum)
Transition of Care [facility]) - Initial/Assessment Note    Patient Details  Name: Vincent [name] MRN: [id number] Date of Birth: 05-28-1971  Transition of Care [facility]) CM/SW Contact:    Epifanio Lesches, RN Phone Number: 05/07/2021, 4:46 PM  Clinical Narrative:      Presented after PEA arrest, Hx of CVA and L sided weakness,  tobacco use, emphysema, DM2 . Pt  currently on ventilator.     NCM spoke with sister [name] [facility]) @ bedside regarding TOC needs. [name] lives in [place]. [name] states PTA  pt resided with another sister who is W/C bound ( hx of CVA) and lives in [place]. States pt also was W/C bound but could bath and dress self .  [name] (Sister)        [phone]       [facility] team following for TOC needs.. ? Vent /SNF . LTAC  not an option 2/2 payor source, Medicaid.  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)    Activities of Daily Living      Permission Sought/Granted      Share Information with NAME: Raynelle Fanning (Sister)  [phone]     Emotional Assessment       Orientation: :  (pt on ventilator) Alcohol / Substance Use: Tobacco Use Psych Involvement: No (comment)  Admission diagnosis:  Cardiac arrest [facility]) [I46.9] Patient Active Problem List   Diagnosis Date Noted   Seizure-like activity (HCC)    Acute respiratory failure with hypoxia (HCC)    Cardiac arrest (HCC) 04/29/2021   PCP:  Oneita Hurt, No Pharmacy:   [id number] [address], [place] [address] [place] AT [facility] [address] [place] [address] Phone: [phone] Fax: [phone]     Social Determinants of Health (SDOH) Interventions    Readmission Risk Interventions No flowsheet data found.

## 2021-05-07 NOTE — Progress Notes (Signed)
NAME:  Vincent [name], MRN:  [id number], DOB:  11/01/1970, LOS: 8 ADMISSION DATE:  04/29/2021, CONSULTATION DATE:  04/29/21 REFERRING MD:  Dr. Jacqulyn Bath, CHIEF COMPLAINT:  Found Down   History of present illness   50 y/o M, admitted to ICU after PEA arrest, likely respiratory driven.  ED note reviewed.  History unobtainable for patient as he is intubated and sedated.  History per chart and sister at bedside.  Patient was not acting right, relatively unresponsive, EMS called.  Found to be agonal breathing.  Unclear initial pulse ox was.  Eventually lost pulse.  CPR was initiated.  1 dose of epi.  About 6 months CPR.  ROSC obtained.  King airway placed.  Transported to the ED.  King airway exchanged for ET tube.  Noted to be hypotensive so norepinephrine started peripherally.  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  CTA PE protocol monitor patient reveals no PE, significant bilateral airspace disease was dense consolidations in the lower lobes, significant emphysema in upper lobes with interstitial thickening felt to be most consistent with pneumonitis/infection versus volume overload.  Moderate pericardial effusion noted by the radiologist.  Other findings as below.  He was given broad-spectrum antibiotics.  Initial lactate over 10.  1 L crystalloid given.  At time of evaluation he is on fentanyl drip and propofol.  Past Medical History  Tobacco Use Emphysema CVA DM  Significant Hospital Events   8/2: PEA Arrest, King airway in the field, CPR x6 minutes and epi x1, ROSC, Intubated in ED, Admitted to [facility]  8/4: Weaned off TTM, Coox O2 sat of 47%, started on milrinone and lasix 8/5 milrinone increased 8/6 TF stopped, OG placed to suction for ileus 8/8 Post pyloric Cortrak placed, myoclonic jerking, started Keppra, depakote  Procedures:  [facility]: 8/3 Cortrak: 8/8  Significant Diagnostic Tests:   CT HEAD WO CONTRAST ( )   Result Date: 04/29/2021 CLINICAL DATA:  Mental status change  EXAM: CT HEAD WITHOUT CONTRAST TECHNIQUE: Contiguous axial images were obtained from the base of the skull through the vertex without intravenous contrast. COMPARISON:  None. FINDINGS: Brain: No acute territorial infarction, hemorrhage or intracranial mass is visualized. Chronic right MCA infarct with extensive encephalomalacia involving the right frontal, parietal and temporal lobes as well as the right thalamus and basal ganglia. Moderate atrophy. Ex vacuo dilatation of right lateral ventricle. Atrophy of right brainstem. Vascular: No hyperdense vessels.  Carotid vascular calcification Skull: Normal. Negative for fracture or focal lesion. Sinuses/Orbits: Mucosal thickening in the sinuses. Chronic appearing deformity of the medial wall right orbit. Other: Incomplete fusion posterior arch of C1 IMPRESSION: 1. No definite CT evidence for acute intracranial abnormality. 2. Atrophy and chronic right MCA infarct. Electronically Signed   By: Jasmine Pang M.D.   On: 04/29/2021 22:09   CT Angio Chest PE W and/or Wo Contrast   Result Date: 04/29/2021 CLINICAL DATA:  PE suspected, high prob Post CPR. EXAM: CT ANGIOGRAPHY CHEST WITH CONTRAST TECHNIQUE: Multidetector CT imaging of the chest was performed using the standard protocol during bolus administration of intravenous contrast. Multiplanar CT image reconstructions and MIPs were obtained to evaluate the vascular anatomy. CONTRAST:  27mL OMNIPAQUE IOHEXOL 350 MG/ML SOLN COMPARISON:  Chest radiograph earlier today. FINDINGS: Cardiovascular: There are no filling defects within the pulmonary arteries to suggest pulmonary embolus. Mild aortic atherosclerosis. Cannot assess for dissection given phase of contrast tailored to pulmonary arteries S1. Multi chamber cardiomegaly. Minimal contrast refluxes into the hepatic veins and IVC. Moderate  size circumferential pericardial effusion. This measures up to 17 mm in depth adjacent to the right ventricle. Mediastinum/Nodes: Shotty  mediastinal adenopathy, including right anterior paratracheal node measuring 10 mm, series 5, image 37. Bilateral hilar lymph nodes measuring 9-10 mm. The esophagus is decompressed by enteric tube. No visualized thyroid nodule. Lungs/Pleura: The endotracheal tube tip is at the level of the carina, recommend retraction of 2-3 cm. Dense lower lobe consolidation, left greater than right, suspicious for aspiration. There additional patchy, ground-glass and confluent airspace opacities throughout both lungs. Mild smooth septal thickening. Underlying emphysema which is partially obscured by superimposed airspace disease. There is a 2.1 x 2.6 cm nodular density posteriorly in the left upper lobe abutting the pleura, series 6, image 26, partially obscured by adjacent airspace disease. Small bilateral pleural effusions, as well as fluid tracking into the right minor fissure. No pneumothorax. Upper Abdomen: No adrenal nodule. No acute upper abdominal findings. Probable scarring in the upper left kidney. Motion obscures evaluation of the upper abdomen. Musculoskeletal: No acute osseous abnormality. No anterior rib fractures typically seen with CPR. No focal bone lesion. Review of the MIP images confirms the above findings. IMPRESSION: 1. No pulmonary embolus. 2. Multi chamber cardiomegaly with moderate circumferential pericardial effusion. Minimal contrast refluxes into the hepatic veins and IVC consistent with elevated right heart pressures. 3. Dense lower lobe consolidation, left greater than right, suspicious for aspiration. Small bilateral pleural effusions. 4. There is set the thickening in ground-glass opacities suspicious for pulmonary edema. Superimposed airspace disease within there is a ground-glass opacity may represent confluent edema or infection. 5. Shotty mediastinal and hilar adenopathy is likely reactive, but nonspecific. 6. Endotracheal tube tip is at the level of the carina, recommend retraction of 2-3 cm.  7. A 2.1 x 2.6 cm nodular density posteriorly in the left upper lobe abutting the pleura is nonspecific given the adjacent parenchymal findings, however recommend attention at follow-up to exclude the possibility of pulmonary mass. Aortic Atherosclerosis (ICD10-I70.0) and Emphysema (ICD10-J43.9). Electronically Signed   By: Melanie  Sanford M.D.   On: 08/Narda Rutherford02/2022 22:02   DG Chest Portable 1 View   Result Date: 04/29/2021 CLINICAL DATA:  Post CPR.  Cardiac arrest EXAM: PORTABLE CHEST 1 VIEW COMPARISON:  None FINDINGS: Endotracheal tube terminates 2.2 cm above carina. External pacer/defibrillator is. Midline trachea. Mild cardiomegaly. No pleural effusion or pneumothorax. Interstitial and airspace disease is relatively diffuse but greater on the left than right. IMPRESSION: Appropriate position of endotracheal tube. Cardiomegaly with left greater than right interstitial and airspace disease. Favor asymmetric pulmonary edema. Given asymmetry, aspiration is possible but felt less likely. Electronically Signed   By: Jeronimo GreavesKyle  Talbot M.D.   On: 04/29/2021 20:53   EEG adult   Result Date: 04/30/2021 Rejeana BrockKirkpatrick, McNeill P, MD     04/30/2021  2:15 AM History: 50 year old male status post cardiac arrest Sedation: Propofol Technique: This is a 21 channel routine scalp EEG performed at the bedside with bipolar and monopolar montages arranged in accordance to the international 10/20 system of electrode placement. One channel was dedicated to EKG recording. Background: The background is diffusely attenuated with ventilator artifact.  There is some degree of muscle artifact throughout most of the recording, but no definite background activity is seen.  He has several episodes of "shaking" without definite EEG change, other than significant muscle artifact.  With one of these episodes muscle artifact does significantly obscure the background, but to a degree I can tell there was no significant EEG change. Photic stimulation:  Physiologic driving is not performed EEG Abnormalities: Diffusely attenuated background Clinical Interpretation: This EEG is severely abnormal with diffuse attenuation of the background.  There was no evidence of the muscle jerking seen was epileptiform in nature.  There was no seizure or seizure predisposition recorded on this study. Please note that lack of epileptiform activity on EEG does not preclude the possibility of epilepsy. Ritta Slot, MD [facility] [phone] If 7pm- 7am, please page neurology on call as listed in AMION.   Overnight EEG with video   Result Date: 04/30/2021 Charlsie Quest, MD     04/30/2021  9:03 AM Patient Name: Vincent [name] MRN: [id number] Epilepsy Attending: Charlsie Quest Referring Physician/Provider: Rutherford Guys, PA Duration: 04/30/2021 0202 to 04/30/2021 0900 Patient history: 50 year old male status post cardiac arrest. EEG to evaluate for seizure Level of alertness:  comatose AEDs during EEG study: LEV, propofol Technical aspects: This EEG study was done with scalp electrodes positioned according to the 10-20 International system of electrode placement. Electrical activity was acquired at a sampling rate of 500Hz  and reviewed with a high frequency filter of 70Hz  and a low frequency filter of 1Hz . EEG data were recorded continuously and digitally stored. Description: EEG showed continuous generalized background suppression.  EEG was not reactive to tactile stimulation.  Event button was pressed on 04/30/2021 at 0752 for tremors in arms and chest. Concomitant EEG before, during and after the event did not show any EEG changes suggest seizure. Hyperventilation and photic stimulation were not performed.   ABNORMALITY -Background suppression, generalized IMPRESSION: This study is suggestive of profound diffuse encephalopathy, nonspecific to etiology.  However with a history of cardiac arrest this could be secondary to anoxic/hypoxic brain injury,  sedation.  No seizures or epileptiform discharges were seen throughout the recording. Event button was pressed on 04/30/2021 at 0752 for tremors in arms and chest without concomitant EEG change. This was most likely not an epileptic event.        ECHOCARDIOGRAM REPORT Patient Name:   [facility] Date of Exam: 04/30/2021  Medical Rec #:  [facility]               Height:       68.0 in  Accession #:    [facility]              Weight:       154.5 lb  Date of Birth:  1971-03-26                BSA:          1.832 m  Patient Age:    50 years                BP:           69/54 mmHg  Patient Gender: M                       HR:           110 bpm.  Exam Location:  Inpatient   Procedure: 2D Echo, Cardiac Doppler and Color Doppler   Indications:    Cardiac Arrest I46.9     History:        Patient has no prior history of Echocardiogram  examinations.     Sonographer:    [id number] Dance  Referring Phys: [id number] RAHUL P DESAI      Sonographer Comments: Echo performed with patient supine and on artificial  respirator. Reading Cardiologist notified.  IMPRESSIONS     1. Minor contractile sparing of the lateral wall. Left ventricular  ejection fraction, by estimation, is <20%. The left ventricle has severely  decreased function. The left ventricle demonstrates global hypokinesis.  Left ventricular diastolic parameters  are consistent with Grade III diastolic dysfunction (restrictive).   2. Right ventricular systolic function is normal. The right ventricular  size is normal. There is mildly elevated pulmonary artery systolic  pressure. The estimated right ventricular systolic pressure is 40.8 mmHg.   3. A small pericardial effusion is present. The pericardial effusion is  circumferential. There is no evidence of cardiac tamponade.   4. The mitral valve is normal in structure. Trivial mitral valve  regurgitation. No evidence of mitral stenosis.   5. The aortic valve is normal in  structure. Aortic valve regurgitation is  not visualized. No aortic stenosis is present.   6. The inferior vena cava is dilated in size with <50% respiratory  variability, suggesting right atrial pressure of 15 mmHg.   FINDINGS   Left Ventricle: Minor contractile sparing of the lateral wall. Left  ventricular ejection fraction, by estimation, is <20%. The left ventricle  has severely decreased function. The left ventricle demonstrates global  hypokinesis. The left ventricular  internal cavity size was normal in size. There is no left ventricular  hypertrophy. Left ventricular diastolic parameters are consistent with  Grade III diastolic dysfunction (restrictive).   Right Ventricle: The right ventricular size is normal. No increase in  right ventricular wall thickness. Right ventricular systolic function is  normal. There is mildly elevated pulmonary artery systolic pressure. The  tricuspid regurgitant velocity is 2.54   m/s, and with an assumed right atrial pressure of 15 mmHg, the estimated  right ventricular systolic pressure is 40.8 mmHg.   Left Atrium: Left atrial size was normal in size.   Right Atrium: Right atrial size was normal in size.   Pericardium: A small pericardial effusion is present. The pericardial  effusion is circumferential. There is no evidence of cardiac tamponade.   Mitral Valve: The mitral valve is normal in structure. Trivial mitral  valve regurgitation. No evidence of mitral valve stenosis.   Tricuspid Valve: The tricuspid valve is normal in structure. Tricuspid  valve regurgitation is not demonstrated. No evidence of tricuspid  stenosis.   Aortic Valve: The aortic valve is normal in structure. Aortic valve  regurgitation is not visualized. No aortic stenosis is present.   Pulmonic Valve: The pulmonic valve was normal in structure. Pulmonic valve  regurgitation is trivial. No evidence of pulmonic stenosis.   Aorta: The aortic root is normal in size  and structure.   Venous: The inferior vena cava is dilated in size with less than 50%  respiratory variability, suggesting right atrial pressure of 15 mmHg.   IAS/Shunts: No atrial level shunt detected by color flow Doppler.     Diastology  LV e' medial:   2.08 cm/s  LV E/e' medial: 28.9    RIGHT VENTRICLE            IVC  RV Basal diam:  3.30 cm    IVC diam: 2.30 cm  RV Mid diam:    2.70 cm  RV S prime:     5.06 cm/s  TAPSE (M-mode): 0.9 cm   LEFT ATRIUM             Index       RIGHT ATRIUM  Index  LA Vol (A2C):   50.8 ml 27.74 ml/m RA Area:     12.50 cm  LA Vol (A4C):   37.5 ml 20.47 ml/m RA Volume:   31.20 ml  17.03 ml/m  LA Biplane Vol: 44.6 ml 24.35 ml/m   AORTIC VALVE  LVOT Vmax:   30.30 cm/s  LVOT Vmean:  22.250 cm/s  LVOT VTI:    0.037 m     AORTA  Ao Asc diam: 2.70 cm   MITRAL VALVE               TRICUSPID VALVE  MV Area (PHT): 3.66 cm    TR Peak grad:   25.8 mmHg  MV Decel Time: 207 msec    TR Vmax:        254.00 cm/s  MV E velocity: 60.10 cm/s  MV A velocity: 41.70 cm/s  SHUNTS  MV E/A ratio:  1.44        Systemic VTI: 0.04 m DG CHEST PORT 1 VIEW  Result Date: 05/06/2021 CLINICAL DATA:  Hypoxia. EXAM: PORTABLE CHEST 1 VIEW COMPARISON:  05/01/2021. FINDINGS: Previously seen layering bilateral pleural effusions appear improved, likely resolved. Overall improved aeration of the lungs with persistent mild diffuse interstitial opacities. No visible pneumothorax on this semi erect radiograph. Mild enlargement the cardiac silhouette, which appears improved. Also, improved pulmonary vascular congestion. Enteric tube courses below the diaphragm with the tip outside the field of view. Left IJ approach central venous catheter with the tip projecting at the superior cavoatrial junction. Endotracheal tube tip projects approximately 3.8 cm above the carina. IMPRESSION: 1. Overall, improved aeration of the lungs with persistent mild diffuse interstitial opacities, which  could represent mild interstitial edema or atypical infection. 2. Previously seen layering bilateral pleural effusions appear improved, likely resolved. 3. Improved mild cardiomegaly and vascular congestion. Electronically Signed   By: Feliberto Harts MD   On: 05/06/2021 13:28   Overnight EEG with video  Result Date: 05/06/2021 Charlsie Quest, MD     05/06/2021  1:24 PM Patient Name: Vincent [name] MRN: [id number] Epilepsy Attending: Charlsie Quest Referring Physician/Provider: Lanae Boast, NP Duration: 05/05/2021 2109 to 05/06/2021 1222  Patient history: 50 year old male status post cardiac arrest. EEG to evaluate for seizure  Level of alertness:  comatose  AEDs during EEG study: LEV, Klonopin  Technical aspects: This EEG study was done with scalp electrodes positioned according to the 10-20 International system of electrode placement. Electrical activity was acquired at a sampling rate of  and reviewed with a high frequency filter of  and a low frequency filter of . EEG data were recorded continuously and digitally stored.  Description: EEG showed continuous generalized 3 to 5 Hz theta-delta slowing.  Multiple events were recorded. During the events, patient was noted to have sudden bilateral upper extremity jerking. Concomitant EEG before, during and after the event did not show any significant myogenic artifact but no definite EEG change was noted suggest seizure.  Hyperventilation and photic stimulation were not performed.    ABNORMALITY -Continuous slow, generalized  IMPRESSION: This study is suggestive of moderate to severe diffuse encephalopathy, nonspecific to etiology.  Multiple events were recorded during which patient had sudden bilateral upper extremity jerking without concomitant EEG change.  These were most likely nonepileptic events.  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  Priyanka Annabelle Harman     Micro Data:  MRSA swab: Negative Resp  Panel: COVID, FLU negative  Antimicrobials:  Cefepime:  8/2>8/2 Vanc: 8/2>8/2 Azithromax: 8/2>>8/4 Ceftriaxone: 8/3>>>8/8 Augmentin: 8/8>>8/10  Interim history/subjective:  O/N Events: Thorazine given O/N for hiccups.   Intubated, not interactive with interviewer.  Objective   Blood pressure 116/74, pulse 87, temperature 97.7 F (36.5 C), resp. rate 18, height 5\' 8"  (1.727 m), weight 66.8 kg, SpO2 94 %.    Vent Mode: PRVC FiO2 (%):  [40 %] 40 % Set Rate:  [18 bmp] 18 bmp Vt Set:  [550 mL] 550 mL PEEP:  [5 cmH20] 5 cmH20 Plateau Pressure:  [15 cmH20-21 cmH20] 19 cmH20   Intake/Output Summary (Last 24 hours) at 05/07/2021 0708 Last data filed at 05/07/2021 0600 Gross per 24 hour  Intake 1335.08 ml  Output 2880 ml  Net -1544.92 ml    Filed Weights   05/05/21 0500 05/06/21 0600 05/07/21 0600  Weight: 70.6 kg 67 kg 66.8 kg   Physical Exam Constitutional:      Comments: Intubated, NAD  Cardiovascular:     Rate and Rhythm: Normal rate and regular rhythm.     Pulses: Normal pulses.     Heart sounds: Normal heart sounds. No murmur heard.   No friction rub. No gallop.  Pulmonary:     Effort: Pulmonary effort is normal.     Breath sounds: Normal breath sounds. No wheezing, rhonchi or rales.  Abdominal:     General: There is distension.     Comments: Decreased BS and distended, non tender to palpation,   Musculoskeletal:     Right lower leg: No edema.     Left lower leg: No edema.  Skin:    General: Skin is warm.  Neurological:     Comments: Tracking with eyes to the right, occasionally left, retracts to pain from both feet bilaterally.         Resolved Hospital Problem list     Assessment & Plan:  Cardiogenic Shock PEA Cardiac Arrest:  Repeat Limited Echo showing EF of 20-25% with similar findings to the complete. Further reduced milrinone yesterday to 0.25, Coox this AM found to have a O2 saturation of 78%. - Decrease Milrinone to 0.125 mcg/kg/hr - AM Coox    Acute Hypoxemic Respiratory Failure Pneumonia:  No febrile events O/N, leukocytosis resolved.  - Complete Augmentin today.  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 There was concern for intractable hiccups and patient was started on thorazine last night. As this is part of his myoclonus, Thorazine was discontinued. - Appreciate Neurology's assistance  - PRN Fentanyl for vent compliance - Continue Keppra IV or PO - Continue Klonopin PRN - No other sedating medications at this time   Acute Kidney Injury:  In setting of cardiogenic shock and PEA arrest. Hypokalemic at 3.0 this AM, getting IV repletion. Creatinine improving, will continue to monitor output and replete electrolytes as needed.  - Trend BMP   Heart Failure with Reduced Ejection Fraction Pericardial Effusion:  K of 3.0, currently receiving IV K, Mg pending.  - PM K - Goal of K>4.0 and magnesium >2.0. Replete as necessary  Ileus/Bowel Obstruction:  Hypoactive bowel sounds this morning, Abdomen distended but not tense. K today of 3 which is getting replenished, will check PM K and replete if needed. Mg is 2.7.  - F/U PM K - Increase TF  to 30 mL/hr - Continue OG on low intermittent suction   Diabetes mellitus Type II: - Continue SSI   Lung Mass: 2.1x2.6cm nodular density posteriorly in the left upper lobe - Will need outpatient follow up if survives this hospitalization  Anemia - Monitor  Best practice:  Diet/type: tubefeeds DVT prophylaxis: prophylactic heparin  GI prophylaxis: PPI Lines: Central line Foley: Yes, and it is still needed Code Status: full code Last date of multidisciplinary goals of care discussion [n/a] Family Communication:Family meeting 8/10 @  1530 Disposition: Pending continued medical workup   Labs   CBC: Recent Labs  Lab 05/03/21 0500 05/04/21 0021 05/05/21 0304 05/06/21 0505 05/07/21 0614  WBC 9.9 9.4 9.1 10.9* 9.0  HGB 10.4* 11.0* 11.0* 11.7* 11.6*  HCT 32.4* 33.7* 32.9* 35.3* 35.9*  MCV 88.3 87.3 85.9 86.5 87.6  PLT 314 349 388 471* 430*     Basic Metabolic Panel: Recent Labs  Lab 05/02/21 0342 05/02/21 1053 05/03/21 0500 05/04/21 0021 05/05/21 0304 05/05/21 1031 05/06/21 0505 05/06/21 0511 05/07/21 0317  NA 137   < > 137 137 142  --  146*  --  145  K 4.3   < > 4.5 3.9 3.4* 4.5 3.5  --  3.0*  CL 102   < > 101 100 102  --  103  --  107  CO2 24   < > --  27  --  25  GLUCOSE 144*   < > 141* 113* 127*  --  92  --  103*  BUN 34*   < > 57* 58* 60*  --  55*  --  44*  CREATININE 2.22*   < > 2.47* 2.12* 2.21*  --  2.51*  --  2.22*  CALCIUM 8.0*   < > 8.6* 9.1 9.0  --  9.1  --  8.4*  MG 2.8*  --  2.9* 2.6* 2.5*  --   --  2.4 2.7*  PHOS 3.6  --  4.3 4.8* 4.5  --   --   --  3.7   < > = values in this interval not displayed.    GFR: Estimated Creatinine Clearance: 37.2 mL/min (A) (by C-G formula based on SCr of 2.22 mg/dL (H)). Recent Labs  Lab 05/04/21 0021 05/04/21 0939 05/05/21 0304 05/06/21 0505 05/07/21 0614  WBC 9.4  --  9.1 10.9* 9.0  LATICACIDVEN  --  1.2  --   --   --      Liver Function Tests: No results for input(s): AST, ALT, ALKPHOS, BILITOT, PROT, ALBUMIN in the last 168 hours.  No results for input(s): LIPASE, AMYLASE in the last 168 hours. No results for input(s): AMMONIA in the last 168 hours.  ABG    Component Value Date/Time   PHART 7.494 (H) 05/02/2021 1822   PCO2ART 30.6 (L) 05/02/2021 1822   PO2ART 95 05/02/2021 1822   HCO3 23.5 05/02/2021 1822   TCO2 24 05/02/2021 1822   ACIDBASEDEF 9.0 (H) 04/30/2021 0815   O2SAT 78.7 05/07/2021 0317      Coagulation Profile: No results for input(s): INR, PROTIME in the last 168 hours.   Cardiac Enzymes: No  results for input(s): CKTOTAL, CKMB, CKMBINDEX, TROPONINI in the last 168 hours.  HbA1C: Hgb A1c MFr Bld  Date/Time Value Ref Range Status  04/30/2021 12:22 AM 5.8 (H) 4.8 - 5.6 % Final    Comment:    (NOTE) Pre diabetes:  5.7%-6.4%  Diabetes:              >6.4%  Glycemic control for   <7.0% adults with diabetes     CBG: Recent Labs  Lab 05/06/21 1120 05/06/21 1525 05/06/21 1923 05/06/21 2318 05/07/21 0322  GLUCAP 102* 107* 115* 114* 112*     Review of Systems:   Negative with exception to above   Surgical History     Social History  Current every day smoker  Family History   Significant for MI and cardiovascular disease  Allergies No Known Allergies   Home Medications  Prior to Admission medications   Medication Sig Start Date End Date Taking? Authorizing Provider  amLODipine (NORVASC) 5 MG tablet Take 5 mg by mouth daily. 04/09/21  Yes [provider]  atorvastatin (LIPITOR) 40 MG tablet Take 40 mg by mouth at bedtime. 04/09/21  Yes [provider]     Dolan Amen, MD IMTS, PGY-3 Pager: [phone] 05/07/2021,7:08 AM

## 2021-05-07 NOTE — Progress Notes (Addendum)
Neurology Progress Note  S: patient is unable to participate in ROS due to intubation. RN reports no sedation and no myoclonic episodes unless patient is stimulated.   O: Current vital signs: BP 121/82   Pulse 96   Temp (!) 97.4 F (36.3 C) (Axillary)   Resp 14   Ht 5' 8"  (1.727 m)   Wt 66.8 kg   SpO2 99%   BMI 22.39 kg/m  Vital signs in last 24 hours: Temp:  [95.9 F (35.5 C)-99.6 F (37.6 C)] 97.4 F (36.3 C) (08/10 1138) Pulse Rate:  [84-106] 96 (08/10 1130) Resp:  [10-24] 14 (08/10 1130) BP: (112-149)/(64-101) 121/82 (08/10 1130) SpO2:  [84 %-99 %] 99 % (08/10 1130) FiO2 (%):  [40 %] 40 % (08/10 1110) Weight:  [66.8 kg] 66.8 kg (08/10 0600)  GENERAL: Acutely ill appearing male. He opens his eyes to loud voice. In NAD. HEENT: Normocephalic and atraumatic. LUNGS: ETT with ventilation.  CV: RRR on tele.  Ext: warm.  NEURO:  Mental Status: Alert appearing at times. Attempts to follow command of looking to the right and left. Follows no other commands.   Speech/Language: mute secondary to ETT.   Cranial Nerves:  II: PERRL 61m. He prefers right gaze but will attempt to look to the left with continued stimulation and examiner moving to the left side.Does not blink to threat OS and is inconsistent to OD.   III, IV, VI: Eyes midline and conjugate.  V, VII: Corneal reflexes intact.    VIII: hearing intact to voice noted by eye opening to continuous loud voice. IX, X: + cough reflex with suctioning.  XI: Head is grossly midline.  XII: ETT in place.  Motor:  LUE spastic. RUE: no movement spontaneously or to noxious stimuli. LLE: With draws to noxious stimuli. RLE with no movement to noxious stimuli.  Sensation- Grimaces with noxious stimuli to RUE/RLE.  Coordination: No twitching, tremors noted.  DTRs:  RUE:  Biceps and brachioradialis 2+.  RLE:  1+ patella.  LUE:  brachioradialis    biceps 3+ LLE:  patella 3+.  Gait- deferred on bedrest..  Medications  Current  Facility-Administered Medications:    0.9 %  sodium chloride infusion, 250 mL, Intravenous, Continuous, Desai, Rahul P, PA-C, Stopped at 05/04/21 1736   acetaminophen (TYLENOL) tablet 650 mg, 650 mg, Per Tube, Q6H PRN, WUrsula Beath RPH, 650 mg at 05/06/21 2102   chlorhexidine gluconate (MEDLINE KIT) (PERIDEX) 0.12 % solution 15 mL, 15 mL, Mouth Rinse, BID, Hunsucker, MBonna Gains MD, 15 mL at 05/07/21 0850   Chlorhexidine Gluconate Cloth 2 % PADS 6 each, 6 each, Topical, Q0600, Hunsucker, MBonna Gains MD, 6 each at 05/07/21 0720   clonazePAM (KLONOPIN) tablet 1 mg, 1 mg, Oral, TID PRN, AAmie Portland MD, 1 mg at 05/06/21 2102   feeding supplement (VITAL AF 1.2 CAL) liquid 1,000 mL, 1,000 mL, Per Tube, Continuous, CJulian Hy DO, Last Rate: 30 mL/hr at 05/07/21 1123, 1,000 mL at 05/07/21 1123   free water 200 mL, 200 mL, Per Tube, Q4H, CNoemi ChapelP, DO, 200 mL at 05/07/21 1153   heparin injection 5,000 Units, 5,000 Units, Subcutaneous, Q8H, Desai, Rahul P, PA-C, 5,000 Units at 05/07/21 0606   insulin aspart (novoLOG) injection 0-15 Units, 0-15 Units, Subcutaneous, Q4H, Hunsucker, MBonna Gains MD, 2 Units at 05/05/21 0401   ipratropium-albuterol (DUONEB) 0.5-2.5 (3) MG/3ML nebulizer solution 3 mL, 3 mL, Nebulization, Q6H PRN, CNoemi ChapelP, DO   levETIRAcetam (KEPPRA) IVPB 500 mg/100  mL premix, 500 mg, Intravenous, Q12H, Amie Portland, MD, Stopping Infusion hung by another clincian at 05/07/21 0730   MEDLINE mouth rinse, 15 mL, Mouth Rinse, 10 times per day, Hunsucker, Bonna Gains, MD, 15 mL at 05/07/21 1154   milrinone (PRIMACOR) 20 MG/100 ML (0.2 mg/mL) infusion, 0.125 mcg/kg/min (Order-Specific), Intravenous, Continuous, Wise, Nason S, RPH, Last Rate: 2.51 mL/hr at 05/07/21 1100, 0.125 mcg/kg/min at 05/07/21 1100   pantoprazole sodium (PROTONIX) 40 mg/20 mL oral suspension 40 mg, 40 mg, Per Tube, QHS, Wise, Nason S, RPH, 40 mg at 05/06/21 2102   polyethylene glycol (MIRALAX / GLYCOLAX) packet 17 g,  17 g, Per Tube, BID, Wise, Nason S, RPH, 17 g at 05/07/21 1040   sodium chloride flush (NS) 0.9 % injection 10-40 mL, 10-40 mL, Intracatheter, Q12H, Candee Furbish, MD, 10 mL at 05/07/21 1100   sodium chloride flush (NS) 0.9 % injection 10-40 mL, 10-40 mL, Intracatheter, PRN, Candee Furbish, MD  No new Imaging  LTM EEG read on 05/06/21 This study is suggestive of moderate to severe diffuse encephalopathy, nonspecific to etiology.  Multiple events were recorded during which patient had sudden bilateral upper extremity jerking without concomitant EEG change. These were most likely nonepileptic events.  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  Assessment: 50 yo male with a history of large old stroke and presented after PEA arrest in the field. Workup revealed an EF ov 20%. He remains encephalopathic likely due to poor brain reserve from prior RMCA stroke. Doubt anoxic brain injury, but hypoxic brain injury is on the differential after 6 minutes of CPR before ROSC. EEG concerning for post arrest myoclonus but no concomitant EEG changes.   Impression: -post cardiac arrest with suspicion for hypoxic brain injury.  -Myoclonus which could definitely be secondary to cardiac arrest or Berneda Rose syndrome as he is 7 days past cardiac arrest.  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   Recommendations/Plan:  -Continue Keppra IV/po -Continue Klonopin prn seizure activity.    Pt seen by Clance Boll, MSN, APN-BC/Nurse Practitioner/Neuro and later by MD. Note and plan to be edited as needed by MD.  Pager: [phone]  Attending addendum Patient seen and examined this morning in the ICU Continues to have myoclonic jerking brief episodes with stimulus-most recently when he was being bathed. Was started on Thorazine for hiccups which I believe are likely myoclonus.  Was very sedated and primary team discontinued the Thorazine. Agree with  minimizing sedating medications. On examination, he opens eyes to voice, he has rightward gaze preference but is able to look somewhat to the left.  He does not blink to threat consistently from either side.  His pupils are equal round reactive to light.  He has known left spastic hemiparesis.  To noxious stimulation to the right there is mild withdrawal along with grimace.  To noxious stimulation on the left, there is only grimace without any active withdrawal. 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 Although brain MRI does not show frank changes of anoxic brain injury, I do suspect that he has sustained some hypoxic damage and given the prior stroke, his poor brain reserve is not helping him much either. Hypoxic ischemic encephalopathy  Recommendations: Continue Keppra standing doses Continue Klonopin as needed Maintain seizure precautions although I think these episodes are stimulus induced myoclonus and not seizures Continue minimizing sedation I will discuss the  clinical course and the plan with the sister who is coming in for an in person meeting from neurology this afternoon.  She should be here by 3:30 PM.  I have requested the patient's RN to page me when the family arrives.   Discussed my plan with Dr. Carlis Abbott in the ICU.   -- Amie Portland, MD Neurologist [facility] Pager: [phone]   CRITICAL CARE ATTESTATION Performed by: Amie Portland, MD Total critical care time: 39 minutes Critical care time was exclusive of separately billable procedures and treating other patients and/or supervising APPs/Residents/Students Critical care was necessary to treat or prevent imminent or life-threatening deterioration due to cephalopathy, Berneda Rose syndrome This patient is critically ill and at significant risk for neurological worsening and/or death and care requires constant  monitoring. Critical care was time spent personally by me on the following activities: development of treatment plan with patient and/or surrogate as well as nursing, discussions with consultants, evaluation of patient's response to treatment, examination of patient, obtaining history from patient or surrogate, ordering and performing treatments and interventions, ordering and review of laboratory studies, ordering and review of radiographic studies, pulse oximetry, re-evaluation of patient's condition, participation in multidisciplinary rounds and medical decision making of high complexity in the care of this patient.   Addendum Met with patient's sister Ms Carmie Kanner, who came to visit him from [place]. Answered all questions. Patient has been made DNR. She would still like to pursue full scope of care.  She believes that his body will heal and would let the body itself decide when it has decided to stop. Dr. Carlis Abbott and the CCM resident was also in the meeting. I will be available as needed-please call with questions.  Additional 15 min for family meeting  -- Amie Portland, MD Neurologist [facility] Pager: [phone]

## 2021-05-07 NOTE — Progress Notes (Signed)
eLink Physician-Brief Progress Note Patient Name: Vincent [name] DOB: [birth year] MRN: [id number]   Date of Service  05/07/2021  HPI/Events of Note  Hypokalemia - K+ = 3.0 and Creatinine = 2.22.  eICU Interventions  Will replace K+.     Intervention Category Major Interventions: Electrolyte abnormality - evaluation and management  Rayhana Slider [place] 05/07/2021, 5:28 AM

## 2021-05-07 NOTE — Progress Notes (Signed)
eLink Physician-Brief Progress Note Patient Name: Samin Yecheskel Kurek DOB: 1970-11-14 MRN: [id number]   Date of Service  05/07/2021  HPI/Events of Note  Hiccups  eICU Interventions  Plan: Thorazine 12.5 mg IV Q 6 hours PRN hiccups.     Intervention Category Major Interventions: Other:  Anjani Feuerborn Dennard Nip 05/07/2021, 2:16 AM

## 2021-05-08 ENCOUNTER — Inpatient Hospital Stay (HOSPITAL_COMMUNITY): Payer: Medicaid Other

## 2021-05-08 DIAGNOSIS — I5021 Acute systolic (congestive) heart failure: Secondary | ICD-10-CM | POA: Diagnosis not present

## 2021-05-08 DIAGNOSIS — J9601 Acute respiratory failure with hypoxia: Secondary | ICD-10-CM | POA: Diagnosis not present

## 2021-05-08 DIAGNOSIS — R570 Cardiogenic shock: Secondary | ICD-10-CM | POA: Diagnosis not present

## 2021-05-08 DIAGNOSIS — G931 Anoxic brain damage, not elsewhere classified: Secondary | ICD-10-CM

## 2021-05-08 LAB — CBC
HCT: 42.5 % (ref 39.0–52.0)
Hemoglobin: 13.5 g/dL (ref 13.0–17.0)
MCH: 28.4 pg (ref 26.0–34.0)
MCHC: 31.8 g/dL (ref 30.0–36.0)
MCV: 89.5 fL (ref 80.0–100.0)
Platelets: 493 10*3/uL — ABNORMAL HIGH (ref 150–400)
RBC: 4.75 MIL/uL (ref 4.22–5.81)
RDW: 14.3 % (ref 11.5–15.5)
WBC: 10.9 10*3/uL — ABNORMAL HIGH (ref 4.0–10.5)
nRBC: 0 % (ref 0.0–0.2)

## 2021-05-08 LAB — BASIC METABOLIC PANEL
Anion gap: 7 (ref 5–15)
BUN: 37 mg/dL — ABNORMAL HIGH (ref 6–20)
CO2: 27 mmol/L (ref 22–32)
Calcium: 8.7 mg/dL — ABNORMAL LOW (ref 8.9–10.3)
Chloride: 111 mmol/L (ref 98–111)
Creatinine, Ser: 1.82 mg/dL — ABNORMAL HIGH (ref 0.61–1.24)
GFR, Estimated: 44 mL/min — ABNORMAL LOW (ref >60)
Glucose, Bld: 114 mg/dL — ABNORMAL HIGH (ref 70–99)
Potassium: 3.8 mmol/L (ref 3.5–5.1)
Sodium: 145 mmol/L (ref 135–145)

## 2021-05-08 LAB — GLUCOSE, CAPILLARY
Glucose-Capillary: 108 mg/dL — ABNORMAL HIGH (ref 70–99)
Glucose-Capillary: 91 mg/dL (ref 70–99)
Glucose-Capillary: 116 mg/dL — ABNORMAL HIGH (ref 70–99)
Glucose-Capillary: 117 mg/dL — ABNORMAL HIGH (ref 70–99)
Glucose-Capillary: 106 mg/dL — ABNORMAL HIGH (ref 70–99)
Glucose-Capillary: 113 mg/dL — ABNORMAL HIGH (ref 70–99)

## 2021-05-08 LAB — COOXEMETRY PANEL
Carboxyhemoglobin: 0.9 % (ref 0.5–1.5)
Methemoglobin: 0.7 % (ref 0.0–1.5)
O2 Saturation: 70.2 %
Total hemoglobin: 13.9 g/dL (ref 12.0–16.0)

## 2021-05-08 LAB — MAGNESIUM: Magnesium: 2.5 mg/dL — ABNORMAL HIGH (ref 1.7–2.4)

## 2021-05-08 MED ORDER — SENNA 8.6 MG PO TABS
1.0000 | ORAL_TABLET | Freq: Every day | ORAL | Status: DC
  Administered 2021-05-08 – 2021-05-09 (×2): 8.6 mg
  Filled 2021-05-08 (×2): qty 1

## 2021-05-08 MED ORDER — VITAL AF 1.2 CAL PO LIQD
1000.0000 mL | ORAL | Status: DC
  Administered 2021-05-08 – 2021-05-15 (×8): 1000 mL
  Filled 2021-05-08: qty 1000

## 2021-05-08 MED ORDER — FUROSEMIDE 10 MG/ML IJ SOLN
20.0000 mg | Freq: Once | INTRAMUSCULAR | Status: AC
  Administered 2021-05-08: 20 mg via INTRAVENOUS
  Filled 2021-05-08: qty 2

## 2021-05-08 MED ORDER — POTASSIUM CHLORIDE 20 MEQ PO PACK
40.0000 meq | PACK | Freq: Once | ORAL | Status: AC
  Administered 2021-05-08: 40 meq
  Filled 2021-05-08: qty 2

## 2021-05-08 NOTE — Progress Notes (Signed)
NAME:  Vincent [name], MRN:  [id number], DOB:  06/24/1971, LOS: 9 ADMISSION DATE:  04/29/2021, CONSULTATION DATE:  04/29/21 REFERRING MD:  Dr. Jacqulyn Bath, CHIEF COMPLAINT:  Found Down   History of present illness   50 y/o M, admitted to ICU after PEA arrest, likely respiratory driven.  ED note reviewed.  History unobtainable for patient as he is intubated and sedated.  History per chart and sister at bedside.  Patient was not acting right, relatively unresponsive, EMS called.  Found to be agonal breathing.  Unclear initial pulse ox was.  Eventually lost pulse.  CPR was initiated.  1 dose of epi.  About 6 months CPR.  ROSC obtained.  King airway placed.  Transported to the ED.  King airway exchanged for ET tube.  Noted to be hypotensive so norepinephrine started peripherally.  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  CTA PE protocol monitor patient reveals no PE, significant bilateral airspace disease was dense consolidations in the lower lobes, significant emphysema in upper lobes with interstitial thickening felt to be most consistent with pneumonitis/infection versus volume overload.  Moderate pericardial effusion noted by the radiologist.  Other findings as below.  He was given broad-spectrum antibiotics.  Initial lactate over 10.  1 L crystalloid given.  At time of evaluation he is on fentanyl drip and propofol.  Past Medical History  Tobacco Use Emphysema CVA DM  Significant Hospital Events   8/2: PEA Arrest, King airway in the field, CPR x6 minutes and epi x1, ROSC, Intubated in ED, Admitted to PCCM  8/4: Weaned off TTM, Coox O2 sat of 47%, started on milrinone and lasix 8/5 milrinone increased 8/6 TF stopped, OG placed to suction for ileus 8/8 Post pyloric Cortrak placed, myoclonic jerking, started Keppra, depakote 8/10 Completed antibiotic course for pneumonia. Family meeting transitioned to DNR.   Procedures:  [facility]: 8/3 Cortrak: 8/8  Significant Diagnostic Tests:   CT HEAD  WO CONTRAST ( )   Result Date: 04/29/2021 CLINICAL DATA:  Mental status change EXAM: CT HEAD WITHOUT CONTRAST TECHNIQUE: Contiguous axial images were obtained from the base of the skull through the vertex without intravenous contrast. COMPARISON:  None. FINDINGS: Brain: No acute territorial infarction, hemorrhage or intracranial mass is visualized. Chronic right MCA infarct with extensive encephalomalacia involving the right frontal, parietal and temporal lobes as well as the right thalamus and basal ganglia. Moderate atrophy. Ex vacuo dilatation of right lateral ventricle. Atrophy of right brainstem. Vascular: No hyperdense vessels.  Carotid vascular calcification Skull: Normal. Negative for fracture or focal lesion. Sinuses/Orbits: Mucosal thickening in the sinuses. Chronic appearing deformity of the medial wall right orbit. Other: Incomplete fusion posterior arch of C1 IMPRESSION: 1. No definite CT evidence for acute intracranial abnormality. 2. Atrophy and chronic right MCA infarct. Electronically Signed   By: Jasmine Pang M.D.   On: 04/29/2021 22:09   CT Angio Chest PE W and/or Wo Contrast   Result Date: 04/29/2021 CLINICAL DATA:  PE suspected, high prob Post CPR. EXAM: CT ANGIOGRAPHY CHEST WITH CONTRAST TECHNIQUE: Multidetector CT imaging of the chest was performed using the standard protocol during bolus administration of intravenous contrast. Multiplanar CT image reconstructions and MIPs were obtained to evaluate the vascular anatomy. CONTRAST:  95mL OMNIPAQUE IOHEXOL 350 MG/ML SOLN COMPARISON:  Chest radiograph earlier today. FINDINGS: Cardiovascular: There are no filling defects within the pulmonary arteries to suggest pulmonary embolus. Mild aortic atherosclerosis. Cannot assess for dissection given phase of contrast tailored to pulmonary arteries S1. Multi  chamber cardiomegaly. Minimal contrast refluxes into the hepatic veins and IVC. Moderate size circumferential pericardial effusion. This  measures up to 17 mm in depth adjacent to the right ventricle. Mediastinum/Nodes: Shotty mediastinal adenopathy, including right anterior paratracheal node measuring 10 mm, series 5, image 37. Bilateral hilar lymph nodes measuring 9-10 mm. The esophagus is decompressed by enteric tube. No visualized thyroid nodule. Lungs/Pleura: The endotracheal tube tip is at the level of the carina, recommend retraction of 2-3 cm. Dense lower lobe consolidation, left greater than right, suspicious for aspiration. There additional patchy, ground-glass and confluent airspace opacities throughout both lungs. Mild smooth septal thickening. Underlying emphysema which is partially obscured by superimposed airspace disease. There is a 2.1 x 2.6 cm nodular density posteriorly in the left upper lobe abutting the pleura, series 6, image 26, partially obscured by adjacent airspace disease. Small bilateral pleural effusions, as well as fluid tracking into the right minor fissure. No pneumothorax. Upper Abdomen: No adrenal nodule. No acute upper abdominal findings. Probable scarring in the upper left kidney. Motion obscures evaluation of the upper abdomen. Musculoskeletal: No acute osseous abnormality. No anterior rib fractures typically seen with CPR. No focal bone lesion. Review of the MIP images confirms the above findings. IMPRESSION: 1. No pulmonary embolus. 2. Multi chamber cardiomegaly with moderate circumferential pericardial effusion. Minimal contrast refluxes into the hepatic veins and IVC consistent with elevated right heart pressures. 3. Dense lower lobe consolidation, left greater than right, suspicious for aspiration. Small bilateral pleural effusions. 4. There is set the thickening in ground-glass opacities suspicious for pulmonary edema. Superimposed airspace disease within there is a ground-glass opacity may represent confluent edema or infection. 5. Shotty mediastinal and hilar adenopathy is likely reactive, but nonspecific.  6. Endotracheal tube tip is at the level of the carina, recommend retraction of 2-3 cm. 7. A 2.1 x 2.6 cm nodular density posteriorly in the left upper lobe abutting the pleura is nonspecific given the adjacent parenchymal findings, however recommend attention at follow-up to exclude the possibility of pulmonary mass. Aortic Atherosclerosis (ICD10-I70.0) and Emphysema (ICD10-J43.9). Electronically Signed   By: Narda Rutherford M.D.   On: 04/29/2021 22:02   DG Chest Portable 1 View   Result Date: 04/29/2021 CLINICAL DATA:  Post CPR.  Cardiac arrest EXAM: PORTABLE CHEST 1 VIEW COMPARISON:  None FINDINGS: Endotracheal tube terminates 2.2 cm above carina. External pacer/defibrillator is. Midline trachea. Mild cardiomegaly. No pleural effusion or pneumothorax. Interstitial and airspace disease is relatively diffuse but greater on the left than right. IMPRESSION: Appropriate position of endotracheal tube. Cardiomegaly with left greater than right interstitial and airspace disease. Favor asymmetric pulmonary edema. Given asymmetry, aspiration is possible but felt less likely. Electronically Signed   By: Jeronimo Greaves M.D.   On: 04/29/2021 20:53   EEG adult   Result Date: 04/30/2021 Rejeana Brock, MD     04/30/2021  2:15 AM History: 50 year old male status post cardiac arrest Sedation: Propofol Technique: This is a 21 channel routine scalp EEG performed at the bedside with bipolar and monopolar montages arranged in accordance to the international 10/20 system of electrode placement. One channel was dedicated to EKG recording. Background: The background is diffusely attenuated with ventilator artifact.  There is some degree of muscle artifact throughout most of the recording, but no definite background activity is seen.  He has several episodes of "shaking" without definite EEG change, other than significant muscle artifact.  With one of these episodes muscle artifact does significantly obscure the background,  but to  a degree I can tell there was no significant EEG change. Photic stimulation: Physiologic driving is not performed EEG Abnormalities: Diffusely attenuated background Clinical Interpretation: This EEG is severely abnormal with diffuse attenuation of the background.  There was no evidence of the muscle jerking seen was epileptiform in nature.  There was no seizure or seizure predisposition recorded on this study. Please note that lack of epileptiform activity on EEG does not preclude the possibility of epilepsy. Ritta Slot, MD [facility] [phone] If 7pm- 7am, please page neurology on call as listed in AMION.   Overnight EEG with video   Result Date: 04/30/2021 Charlsie Quest, MD     04/30/2021  9:03 AM Patient Name: Rithik Odea MRN: [id number] Epilepsy Attending: Charlsie Quest Referring Physician/Provider: Rutherford Guys, PA Duration: 04/30/2021 0202 to 04/30/2021 0900 Patient history: 50 year old male status post cardiac arrest. EEG to evaluate for seizure Level of alertness:  comatose AEDs during EEG study: LEV, propofol Technical aspects: This EEG study was done with scalp electrodes positioned according to the 10-20 International system of electrode placement. Electrical activity was acquired at a sampling rate of 500Hz  and reviewed with a high frequency filter of 70Hz  and a low frequency filter of 1Hz . EEG data were recorded continuously and digitally stored. Description: EEG showed continuous generalized background suppression.  EEG was not reactive to tactile stimulation.  Event button was pressed on 04/30/2021 at 0752 for tremors in arms and chest. Concomitant EEG before, during and after the event did not show any EEG changes suggest seizure. Hyperventilation and photic stimulation were not performed.   ABNORMALITY -Background suppression, generalized IMPRESSION: This study is suggestive of profound diffuse encephalopathy, nonspecific to etiology.  However with a  history of cardiac arrest this could be secondary to anoxic/hypoxic brain injury, sedation.  No seizures or epileptiform discharges were seen throughout the recording. Event button was pressed on 04/30/2021 at 0752 for tremors in arms and chest without concomitant EEG change. This was most likely not an epileptic event.        ECHOCARDIOGRAM REPORT Patient Name:   [facility] Date of Exam: 04/30/2021  Medical Rec #:  [facility]               Height:       68.0 in  Accession #:    [facility]              Weight:       154.5 lb  Date of Birth:  1971/06/26                BSA:          1.832 m  Patient Age:    50 years                BP:           69/54 mmHg  Patient Gender: M                       HR:           110 bpm.  Exam Location:  Inpatient   Procedure: 2D Echo, Cardiac Doppler and Color Doppler   Indications:    Cardiac Arrest I46.9     History:        Patient has no prior history of Echocardiogram  examinations.     Sonographer:    [id number] Dance  Referring Phys: [id number] RAHUL P DESAI  Sonographer Comments: Echo performed with patient supine and on artificial  respirator. Reading Cardiologist notified.  IMPRESSIONS     1. Minor contractile sparing of the lateral wall. Left ventricular  ejection fraction, by estimation, is <20%. The left ventricle has severely  decreased function. The left ventricle demonstrates global hypokinesis.  Left ventricular diastolic parameters  are consistent with Grade III diastolic dysfunction (restrictive).   2. Right ventricular systolic function is normal. The right ventricular  size is normal. There is mildly elevated pulmonary artery systolic  pressure. The estimated right ventricular systolic pressure is 40.8 mmHg.   3. A small pericardial effusion is present. The pericardial effusion is  circumferential. There is no evidence of cardiac tamponade.   4. The mitral valve is normal in structure. Trivial mitral valve   regurgitation. No evidence of mitral stenosis.   5. The aortic valve is normal in structure. Aortic valve regurgitation is  not visualized. No aortic stenosis is present.   6. The inferior vena cava is dilated in size with <50% respiratory  variability, suggesting right atrial pressure of 15 mmHg.   FINDINGS   Left Ventricle: Minor contractile sparing of the lateral wall. Left  ventricular ejection fraction, by estimation, is <20%. The left ventricle  has severely decreased function. The left ventricle demonstrates global  hypokinesis. The left ventricular  internal cavity size was normal in size. There is no left ventricular  hypertrophy. Left ventricular diastolic parameters are consistent with  Grade III diastolic dysfunction (restrictive).   Right Ventricle: The right ventricular size is normal. No increase in  right ventricular wall thickness. Right ventricular systolic function is  normal. There is mildly elevated pulmonary artery systolic pressure. The  tricuspid regurgitant velocity is 2.54   m/s, and with an assumed right atrial pressure of 15 mmHg, the estimated  right ventricular systolic pressure is 40.8 mmHg.   Left Atrium: Left atrial size was normal in size.   Right Atrium: Right atrial size was normal in size.   Pericardium: A small pericardial effusion is present. The pericardial  effusion is circumferential. There is no evidence of cardiac tamponade.   Mitral Valve: The mitral valve is normal in structure. Trivial mitral  valve regurgitation. No evidence of mitral valve stenosis.   Tricuspid Valve: The tricuspid valve is normal in structure. Tricuspid  valve regurgitation is not demonstrated. No evidence of tricuspid  stenosis.   Aortic Valve: The aortic valve is normal in structure. Aortic valve  regurgitation is not visualized. No aortic stenosis is present.   Pulmonic Valve: The pulmonic valve was normal in structure. Pulmonic valve  regurgitation is  trivial. No evidence of pulmonic stenosis.   Aorta: The aortic root is normal in size and structure.   Venous: The inferior vena cava is dilated in size with less than 50%  respiratory variability, suggesting right atrial pressure of 15 mmHg.   IAS/Shunts: No atrial level shunt detected by color flow Doppler.     Diastology  LV e' medial:   2.08 cm/s  LV E/e' medial: 28.9    RIGHT VENTRICLE            IVC  RV Basal diam:  3.30 cm    IVC diam: 2.30 cm  RV Mid diam:    2.70 cm  RV S prime:     5.06 cm/s  TAPSE (M-mode): 0.9 cm   LEFT ATRIUM             Index  RIGHT ATRIUM           Index  LA Vol (A2C):   50.8 ml 27.74 ml/m RA Area:     12.50 cm  LA Vol (A4C):   37.5 ml 20.47 ml/m RA Volume:   31.20 ml  17.03 ml/m  LA Biplane Vol: 44.6 ml 24.35 ml/m   AORTIC VALVE  LVOT Vmax:   30.30 cm/s  LVOT Vmean:  22.250 cm/s  LVOT VTI:    0.037 m     AORTA  Ao Asc diam: 2.70 cm   MITRAL VALVE               TRICUSPID VALVE  MV Area (PHT): 3.66 cm    TR Peak grad:   25.8 mmHg  MV Decel Time: 207 msec    TR Vmax:        254.00 cm/s  MV E velocity: 60.10 cm/s  MV A velocity: 41.70 cm/s  SHUNTS  MV E/A ratio:  1.44        Systemic VTI: 0.04 m No results found.   Micro Data:  MRSA swab: Negative Resp Panel: COVID, FLU negative  Antimicrobials:  Cefepime: 8/2>8/2 Vanc: 8/2>8/2 Azithromax: 8/2>>8/4 Ceftriaxone: 8/3>>>8/8 Augmentin: 8/8>>8/10  Interim history/subjective:  O/N Events: No events ON  Intubated, non participatory  Objective   Blood pressure (!) 133/107, pulse 99, temperature 98.9 F (37.2 C), temperature source Oral, resp. rate 17, height  (1.727 m), weight 66.2 kg, SpO2 96 %.    Vent Mode: PRVC FiO2 (%):  [40 %] 40 % Set Rate:  [14 bmp-18 bmp] 14 bmp Vt Set:  [550 mL] 550 mL PEEP:  [5 cmH20] 5 cmH20 Plateau Pressure:  [18 cmH20-20 cmH20] 19 cmH20   Intake/Output Summary (Last 24 hours) at 05/08/2021 0703 Last data filed at 05/08/2021 0600 Gross  per 24 hour  Intake 2418.5 ml  Output 1380 ml  Net 1038.5 ml    Filed Weights   05/06/21 0600 05/07/21 0600 05/08/21 0600  Weight: 67 kg 66.8 kg 66.2 kg   Physical Exam Constitutional:      Comments: Intubated, not interactive, ill appearing, NAD  Eyes:     General: No scleral icterus.       Right eye: No discharge.        Left eye: No discharge.     Conjunctiva/sclera: Conjunctivae normal.  Cardiovascular:     Rate and Rhythm: Normal rate and regular rhythm.     Pulses: Normal pulses.     Heart sounds: Normal heart sounds. No murmur heard.   No friction rub. No gallop.  Pulmonary:     Breath sounds: No wheezing, rhonchi or rales.     Comments: intubated Abdominal:     Tenderness: There is no abdominal tenderness.     Comments: Mildly distended, hypoactive bowel sounds.   Neurological:     Comments: Continues to intermittently track to the right and left, retracts from pain of the lower extremities bilaterally.         Resolved Hospital Problem list     Assessment & Plan:  Cardiogenic Shock PEA Cardiac Arrest:  Repeat Limited Echo showing EF of 20-25% with similar findings to the complete. Further reduced milrinone yesterday to 0.25, Coox this AM found to have a O2 saturation of 70%. - DC Milrinone - PM Coox, if stable can withdrawal LIJ.    Acute Hypoxemic Respiratory Failure Requiring MV:  Pneumonia:  No febrile events O/N, Completed full course of antibiotics. Mildly  elevated leukocytosis this AM. 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 Family discussion yesterday significant for continued care, but CODE STATUS was changed to DNR. Will likely need tracheostomy  and PEG tube next week.  - Appreciate Neurology's assistance  - PRN Fentanyl for vent compliance - Continue Keppra IV or PO - Continue Klonopin PRN - No other sedating medications at this time   Acute Kidney Injury:  In setting of cardiogenic shock and PEA arrest. Kidney function continues to improve.  - Trend BMP   Heart Failure with Reduced Ejection Fraction Pericardial Effusion:  - Goal of K>4.0 and magnesium >2.0. Replete as necessary  Ileus/Bowel Obstruction: Bowel still slightly distended less tympanic, hypoactive BS, Replenishing K this AM. No significant output from OG tube ON.  - Continue TF - Continue OG on low intermittent suction   Diabetes mellitus Type II: - Continue SSI   Lung Mass: 2.1x2.6cm nodular density posteriorly in the left upper lobe - Will need outpatient follow up if survives this hospitalization  Anemia - Monitor  Best practice:  Diet/type: tubefeeds DVT prophylaxis: prophylactic heparin  GI prophylaxis: PPI Lines: Central line Foley: Yes, and it is still needed Code Status: full code Last date of multidisciplinary goals of care discussion [n/a] Family Communication:Family meeting 8/10 @ 1530 Disposition: Pending continued medical workup   Labs   CBC: Recent Labs  Lab 05/04/21 0021 05/05/21 0304 05/06/21 0505 05/07/21 0614 05/08/21 0317  WBC 9.4 9.1 10.9* 9.0 10.9*  HGB 11.0* 11.0* 11.7* 11.6* 13.5  HCT 33.7* 32.9* 35.3* 35.9* 42.5  MCV 87.3 85.9 86.5 87.6 89.5  PLT 349 388 471* 430* 493*     Basic Metabolic Panel: Recent Labs  Lab 05/02/21 0342 05/02/21 1053 05/03/21 0500 05/04/21 0021 05/05/21 0304 05/05/21 1031 05/06/21 0505 05/06/21 0511 05/07/21 0317 05/07/21 1725 05/08/21 0317  NA 137   < > 137 137 142  --  146*  --  145  --  145  K 4.3   < > 4.5 3.9 3.4* 4.5 3.5  --  3.0* 4.1 3.8  CL 102   < > 101 100 102  --  103  --  107  --  111  CO2 24   < > 24 26 28   --  27  --  25  --  27  GLUCOSE 144*   < > 141* 113*  127*  --  92  --  103*  --  114*  BUN 34*   < > 57* 58* 60*  --  55*  --  44*  --  37*  CREATININE 2.22*   < > 2.47* 2.12* 2.21*  --  2.51*  --  2.22*  --  1.82*  CALCIUM 8.0*   < > 8.6* 9.1 9.0  --  9.1  --  8.4*  --  8.7*  MG 2.8*  --  2.9* 2.6* 2.5*  --   --  2.4 2.7*  --   --   PHOS 3.6  --  4.3 4.8* 4.5  --   --   --  3.7  --   --    < > = values in this interval not displayed.    GFR:  Estimated Creatinine Clearance: 45 mL/min (A) (by C-G formula based on SCr of 1.82 mg/dL (H)). Recent Labs  Lab 05/04/21 0939 05/05/21 0304 05/06/21 0505 05/07/21 0614 05/08/21 0317  WBC  --  9.1 10.9* 9.0 10.9*  LATICACIDVEN 1.2  --   --   --   --      Liver Function Tests: No results for input(s): AST, ALT, ALKPHOS, BILITOT, PROT, ALBUMIN in the last 168 hours.  No results for input(s): LIPASE, AMYLASE in the last 168 hours. No results for input(s): AMMONIA in the last 168 hours.  ABG    Component Value Date/Time   PHART 7.494 (H) 05/02/2021 1822   PCO2ART 30.6 (L) 05/02/2021 1822   PO2ART 95 05/02/2021 1822   HCO3 23.5 05/02/2021 1822   TCO2 24 05/02/2021 1822   ACIDBASEDEF 9.0 (H) 04/30/2021 0815   O2SAT 70.2 05/08/2021 0317      Coagulation Profile: No results for input(s): INR, PROTIME in the last 168 hours.   Cardiac Enzymes: No results for input(s): CKTOTAL, CKMB, CKMBINDEX, TROPONINI in the last 168 hours.  HbA1C: Hgb A1c MFr Bld  Date/Time Value Ref Range Status  04/30/2021 12:22 AM 5.8 (H) 4.8 - 5.6 % Final    Comment:    (NOTE) Pre diabetes:          5.7%-6.4%  Diabetes:              >6.4%  Glycemic control for   <7.0% adults with diabetes     CBG: Recent Labs  Lab 05/07/21 1136 05/07/21 1511 05/07/21 1917 05/07/21 2330 05/08/21 0318  GLUCAP 115* 110* 112* 124* 108*     Review of Systems:   Negative with exception to above   Surgical History     Social History  Current every day smoker  Family History   Significant for MI and  cardiovascular disease  Allergies No Known Allergies   Home Medications  Prior to Admission medications   Medication Sig Start Date End Date Taking? Authorizing Provider  amLODipine (NORVASC) 5 MG tablet Take 5 mg by mouth daily. 04/09/21  Yes [provider]  atorvastatin (LIPITOR) 40 MG tablet Take 40 mg by mouth at bedtime. 04/09/21  Yes [provider]     Dolan AmenSteven Rhaya Coale, MD [facility], PGY-3 Pager: [phone] 05/08/2021,7:03 AM

## 2021-05-08 NOTE — Progress Notes (Signed)
Nutrition Follow-up  DOCUMENTATION CODES:   Not applicable  INTERVENTION:   Continue tube feeding via Cortrak: Vital AF 1.2 increase by 10 ml every 4 hours to goal rate of 65 ml/h (1560 ml per day)  Provides 1872 kcal, 117 gm protein, 1265 ml free water daily.  NUTRITION DIAGNOSIS:   Inadequate oral intake related to inability to eat as evidenced by NPO status.  Ongoing   GOAL:   Patient will meet greater than or equal to 90% of their needs  Progressing   MONITOR:   Vent status, Labs, TF tolerance  REASON FOR ASSESSMENT:   Ventilator, Consult Enteral/tube feeding initiation and management  ASSESSMENT:   50 yo male admitted S/P PEA cardiac arrest at home. PMH includes tobacco abuse, emphysema, CVA, diabetes.  Discussed patient in ICU rounds and with RN today. Patient is waking up, but not following commands. Myoclonus ongoing. Code status was changed to DNR. Family wants to pursue aggressive care; will likely receive a trach in the near future.   Cortrak tube was advanced 8/9, tip is now beyond the ligament of Treitz. Patient has been tolerating TF at 30 ml/h. Receiving free water flushes 200 ml every 4 hours. Okay to advance TF to goal rate per discussion with CCM.   Patient remains intubated on ventilator support MV: 8.6 L/min Temp (24hrs), Avg:98.7 F (37.1 C), Min:97.8 F (36.6 C), Max:99.6 F (37.6 C)   Labs reviewed.  CBG: [id number]  Medications reviewed and include lasix, novolog, protonix, miralax, potassium chloride, keppra.  Admission weight 70 kg  Current weight 66.2 kg  I/O +3.4 L since admission OG output 100 ml x 24 hours UOP 1280 ml x 24 hours  Diet Order:   Diet Order     None       EDUCATION NEEDS:   Not appropriate for education at this time  Skin:  Skin Assessment: Reviewed RN Assessment  Last BM:  8/10 type 7  Height:   Ht Readings from Last 1 Encounters:  04/29/21 5\' 8"  (1.727 m)    Weight:   Wt  Readings from Last 1 Encounters:  05/08/21 66.2 kg    Ideal Body Weight:  70 kg  BMI:  Body mass index is 22.19 kg/m.  Estimated Nutritional Needs:   Kcal:  1850  Protein:  110-125 gm  Fluid:  >/= 1.9 L    07/08/21, RD, LDN, CNSC Please refer to Amion for contact information.

## 2021-05-08 NOTE — Progress Notes (Signed)
Spoke with MD regarding patient's Klonopin order. MD states it is okay to give Klonopin (PRN) if the patient is having frequent myoclonic movements and not being stimulated. MD states patient will have myoclonic movements when he is stimulated such as suctioning and turning, if the myoclonic movements are happening without this stimulation the patient can be given the Klonopin.

## 2021-05-08 NOTE — Progress Notes (Signed)
Patient's OG Tube came un-taped from position and unsure if it was in the right spot. Abdominal xray was ordered. MD gave verbal order to advance 5 cm. This RN advanced OG Tube 5 cm and new abdominal xray order placed.

## 2021-05-09 DIAGNOSIS — J9601 Acute respiratory failure with hypoxia: Secondary | ICD-10-CM | POA: Diagnosis not present

## 2021-05-09 DIAGNOSIS — I5020 Unspecified systolic (congestive) heart failure: Secondary | ICD-10-CM

## 2021-05-09 DIAGNOSIS — I469 Cardiac arrest, cause unspecified: Secondary | ICD-10-CM | POA: Diagnosis not present

## 2021-05-09 DIAGNOSIS — G931 Anoxic brain damage, not elsewhere classified: Secondary | ICD-10-CM | POA: Diagnosis not present

## 2021-05-09 DIAGNOSIS — G253 Myoclonus: Secondary | ICD-10-CM | POA: Diagnosis not present

## 2021-05-09 LAB — BASIC METABOLIC PANEL
Anion gap: 8 (ref 5–15)
BUN: 36 mg/dL — ABNORMAL HIGH (ref 6–20)
CO2: 25 mmol/L (ref 22–32)
Calcium: 8.9 mg/dL (ref 8.9–10.3)
Chloride: 110 mmol/L (ref 98–111)
Creatinine, Ser: 1.63 mg/dL — ABNORMAL HIGH (ref 0.61–1.24)
GFR, Estimated: 51 mL/min — ABNORMAL LOW (ref >60)
Glucose, Bld: 115 mg/dL — ABNORMAL HIGH (ref 70–99)
Potassium: 3.8 mmol/L (ref 3.5–5.1)
Sodium: 143 mmol/L (ref 135–145)

## 2021-05-09 LAB — GLUCOSE, CAPILLARY
Glucose-Capillary: 125 mg/dL — ABNORMAL HIGH (ref 70–99)
Glucose-Capillary: 110 mg/dL — ABNORMAL HIGH (ref 70–99)
Glucose-Capillary: 117 mg/dL — ABNORMAL HIGH (ref 70–99)
Glucose-Capillary: 112 mg/dL — ABNORMAL HIGH (ref 70–99)
Glucose-Capillary: 115 mg/dL — ABNORMAL HIGH (ref 70–99)
Glucose-Capillary: 109 mg/dL — ABNORMAL HIGH (ref 70–99)

## 2021-05-09 LAB — CBC
HCT: 40.6 % (ref 39.0–52.0)
Hemoglobin: 13 g/dL (ref 13.0–17.0)
MCH: 28.7 pg (ref 26.0–34.0)
MCHC: 32 g/dL (ref 30.0–36.0)
MCV: 89.6 fL (ref 80.0–100.0)
Platelets: 487 10*3/uL — ABNORMAL HIGH (ref 150–400)
RBC: 4.53 MIL/uL (ref 4.22–5.81)
RDW: 14.4 % (ref 11.5–15.5)
WBC: 13.2 10*3/uL — ABNORMAL HIGH (ref 4.0–10.5)
nRBC: 0 % (ref 0.0–0.2)

## 2021-05-09 LAB — MAGNESIUM: Magnesium: 2.6 mg/dL — ABNORMAL HIGH (ref 1.7–2.4)

## 2021-05-09 MED ORDER — CLONAZEPAM 1 MG PO TABS
2.0000 mg | ORAL_TABLET | Freq: Three times a day (TID) | ORAL | Status: DC | PRN
  Administered 2021-05-09 – 2021-05-17 (×7): 2 mg via ORAL
  Filled 2021-05-09 (×7): qty 2

## 2021-05-09 MED ORDER — POTASSIUM CHLORIDE 20 MEQ PO PACK
40.0000 meq | PACK | Freq: Two times a day (BID) | ORAL | Status: AC
  Administered 2021-05-09 (×2): 40 meq
  Filled 2021-05-09 (×2): qty 2

## 2021-05-09 MED ORDER — MIDAZOLAM HCL 2 MG/2ML IJ SOLN: 2.0000 mg | INTRAMUSCULAR | Status: DC | PRN

## 2021-05-09 MED ORDER — MIDAZOLAM HCL 2 MG/2ML IJ SOLN
2.0000 mg | Freq: Once | INTRAMUSCULAR | Status: AC
  Administered 2021-05-09: 2 mg via INTRAVENOUS
  Filled 2021-05-09: qty 2

## 2021-05-09 NOTE — Progress Notes (Addendum)
NAME:  Vincent [name], MRN:  [id number], DOB:  24-Apr-1971, LOS: 10 ADMISSION DATE:  04/29/2021, CONSULTATION DATE:  04/29/21 REFERRING MD:  Dr. Jacqulyn Bath, CHIEF COMPLAINT:  Found Down   History of present illness   50 y/o M, admitted to ICU after PEA arrest, likely respiratory driven.  ED note reviewed.  History unobtainable for patient as he is intubated and sedated.  History per chart and sister at bedside.  Patient was not acting right, relatively unresponsive, EMS called.  Found to be agonal breathing.  Unclear initial pulse ox was.  Eventually lost pulse.  CPR was initiated.  1 dose of epi.  About 6 months CPR.  ROSC obtained.  King airway placed.  Transported to the ED.  King airway exchanged for ET tube.  Noted to be hypotensive so norepinephrine started peripherally.  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  CTA PE protocol monitor patient reveals no PE, significant bilateral airspace disease was dense consolidations in the lower lobes, significant emphysema in upper lobes with interstitial thickening felt to be most consistent with pneumonitis/infection versus volume overload.  Moderate pericardial effusion noted by the radiologist.  Other findings as below.  He was given broad-spectrum antibiotics.  Initial lactate over 10.  1 L crystalloid given.  At time of evaluation he is on fentanyl drip and propofol.  Past Medical History  Tobacco Use Emphysema CVA DM  Significant Hospital Events   8/2: PEA Arrest, King airway in the field, CPR x6 minutes and epi x1, ROSC, Intubated in ED, Admitted to PCCM  8/4: Weaned off TTM, [place] O2 sat of 47%, started on milrinone and lasix 8/5 milrinone increased 8/6 TF stopped, OG placed to suction for ileus 8/8 Post pyloric Cortrak placed, myoclonic jerking, started Keppra, depakote 8/10 Completed antibiotic course for pneumonia. Family meeting transitioned to DNR.   Procedures:  [facility]: 8/3 Cortrak: 8/8  Significant Diagnostic Tests:   CT HEAD  WO CONTRAST ( )   Result Date: 04/29/2021 CLINICAL DATA:  Mental status change EXAM: CT HEAD WITHOUT CONTRAST TECHNIQUE: Contiguous axial images were obtained from the base of the skull through the vertex without intravenous contrast. COMPARISON:  None. FINDINGS: Brain: No acute territorial infarction, hemorrhage or intracranial mass is visualized. Chronic right MCA infarct with extensive encephalomalacia involving the right frontal, parietal and temporal lobes as well as the right thalamus and basal ganglia. Moderate atrophy. Ex vacuo dilatation of right lateral ventricle. Atrophy of right brainstem. Vascular: No hyperdense vessels.  Carotid vascular calcification Skull: Normal. Negative for fracture or focal lesion. Sinuses/Orbits: Mucosal thickening in the sinuses. Chronic appearing deformity of the medial wall right orbit. Other: Incomplete fusion posterior arch of C1 IMPRESSION: 1. No definite CT evidence for acute intracranial abnormality. 2. Atrophy and chronic right MCA infarct. Electronically Signed   By: Jasmine Pang M.D.   On: 04/29/2021 22:09   CT Angio Chest PE W and/or Wo Contrast   Result Date: 04/29/2021 CLINICAL DATA:  PE suspected, high prob Post CPR. EXAM: CT ANGIOGRAPHY CHEST WITH CONTRAST TECHNIQUE: Multidetector CT imaging of the chest was performed using the standard protocol during bolus administration of intravenous contrast. Multiplanar CT image reconstructions and MIPs were obtained to evaluate the vascular anatomy. CONTRAST:  50mL OMNIPAQUE IOHEXOL 350 MG/ML SOLN COMPARISON:  Chest radiograph earlier today. FINDINGS: Cardiovascular: There are no filling defects within the pulmonary arteries to suggest pulmonary embolus. Mild aortic atherosclerosis. Cannot assess for dissection given phase of contrast tailored to pulmonary arteries S1. Multi  chamber cardiomegaly. Minimal contrast refluxes into the hepatic veins and IVC. Moderate size circumferential pericardial effusion. This  measures up to 17 mm in depth adjacent to the right ventricle. Mediastinum/Nodes: Shotty mediastinal adenopathy, including right anterior paratracheal node measuring 10 mm, series 5, image 37. Bilateral hilar lymph nodes measuring 9-10 mm. The esophagus is decompressed by enteric tube. No visualized thyroid nodule. Lungs/Pleura: The endotracheal tube tip is at the level of the carina, recommend retraction of 2-3 cm. Dense lower lobe consolidation, left greater than right, suspicious for aspiration. There additional patchy, ground-glass and confluent airspace opacities throughout both lungs. Mild smooth septal thickening. Underlying emphysema which is partially obscured by superimposed airspace disease. There is a 2.1 x 2.6 cm nodular density posteriorly in the left upper lobe abutting the pleura, series 6, image 26, partially obscured by adjacent airspace disease. Small bilateral pleural effusions, as well as fluid tracking into the right minor fissure. No pneumothorax. Upper Abdomen: No adrenal nodule. No acute upper abdominal findings. Probable scarring in the upper left kidney. Motion obscures evaluation of the upper abdomen. Musculoskeletal: No acute osseous abnormality. No anterior rib fractures typically seen with CPR. No focal bone lesion. Review of the MIP images confirms the above findings. IMPRESSION: 1. No pulmonary embolus. 2. Multi chamber cardiomegaly with moderate circumferential pericardial effusion. Minimal contrast refluxes into the hepatic veins and IVC consistent with elevated right heart pressures. 3. Dense lower lobe consolidation, left greater than right, suspicious for aspiration. Small bilateral pleural effusions. 4. There is set the thickening in ground-glass opacities suspicious for pulmonary edema. Superimposed airspace disease within there is a ground-glass opacity may represent confluent edema or infection. 5. Shotty mediastinal and hilar adenopathy is likely reactive, but nonspecific.  6. Endotracheal tube tip is at the level of the carina, recommend retraction of 2-3 cm. 7. A 2.1 x 2.6 cm nodular density posteriorly in the left upper lobe abutting the pleura is nonspecific given the adjacent parenchymal findings, however recommend attention at follow-up to exclude the possibility of pulmonary mass. Aortic Atherosclerosis (ICD10-I70.0) and Emphysema (ICD10-J43.9). Electronically Signed   By: Narda Rutherford M.D.   On: 04/29/2021 22:02   DG Chest Portable 1 View   Result Date: 04/29/2021 CLINICAL DATA:  Post CPR.  Cardiac arrest EXAM: PORTABLE CHEST 1 VIEW COMPARISON:  None FINDINGS: Endotracheal tube terminates 2.2 cm above carina. External pacer/defibrillator is. Midline trachea. Mild cardiomegaly. No pleural effusion or pneumothorax. Interstitial and airspace disease is relatively diffuse but greater on the left than right. IMPRESSION: Appropriate position of endotracheal tube. Cardiomegaly with left greater than right interstitial and airspace disease. Favor asymmetric pulmonary edema. Given asymmetry, aspiration is possible but felt less likely. Electronically Signed   By: Jeronimo Greaves M.D.   On: 04/29/2021 20:53   EEG adult   Result Date: 04/30/2021 Rejeana Brock, MD     04/30/2021  2:15 AM History: 50 year old male status post cardiac arrest Sedation: Propofol Technique: This is a 21 channel routine scalp EEG performed at the bedside with bipolar and monopolar montages arranged in accordance to the international 10/20 system of electrode placement. One channel was dedicated to EKG recording. Background: The background is diffusely attenuated with ventilator artifact.  There is some degree of muscle artifact throughout most of the recording, but no definite background activity is seen.  He has several episodes of "shaking" without definite EEG change, other than significant muscle artifact.  With one of these episodes muscle artifact does significantly obscure the background,  but to  a degree I can tell there was no significant EEG change. Photic stimulation: Physiologic driving is not performed EEG Abnormalities: Diffusely attenuated background Clinical Interpretation: This EEG is severely abnormal with diffuse attenuation of the background.  There was no evidence of the muscle jerking seen was epileptiform in nature.  There was no seizure or seizure predisposition recorded on this study. Please note that lack of epileptiform activity on EEG does not preclude the possibility of epilepsy. Ritta Slot, MD [facility] [phone] If 7pm- 7am, please page neurology on call as listed in AMION.   Overnight EEG with video   Result Date: 04/30/2021 Charlsie Quest, MD     04/30/2021  9:03 AM Patient Name: Rithik Odea MRN: [id number] Epilepsy Attending: Charlsie Quest Referring Physician/Provider: Rutherford Guys, PA Duration: 04/30/2021 0202 to 04/30/2021 0900 Patient history: 50 year old male status post cardiac arrest. EEG to evaluate for seizure Level of alertness:  comatose AEDs during EEG study: LEV, propofol Technical aspects: This EEG study was done with scalp electrodes positioned according to the 10-20 International system of electrode placement. Electrical activity was acquired at a sampling rate of 500Hz  and reviewed with a high frequency filter of 70Hz  and a low frequency filter of 1Hz . EEG data were recorded continuously and digitally stored. Description: EEG showed continuous generalized background suppression.  EEG was not reactive to tactile stimulation.  Event button was pressed on 04/30/2021 at 0752 for tremors in arms and chest. Concomitant EEG before, during and after the event did not show any EEG changes suggest seizure. Hyperventilation and photic stimulation were not performed.   ABNORMALITY -Background suppression, generalized IMPRESSION: This study is suggestive of profound diffuse encephalopathy, nonspecific to etiology.  However with a  history of cardiac arrest this could be secondary to anoxic/hypoxic brain injury, sedation.  No seizures or epileptiform discharges were seen throughout the recording. Event button was pressed on 04/30/2021 at 0752 for tremors in arms and chest without concomitant EEG change. This was most likely not an epileptic event.        ECHOCARDIOGRAM REPORT Patient Name:   [facility] Date of Exam: 04/30/2021  Medical Rec #:  [facility]               Height:       68.0 in  Accession #:    [facility]              Weight:       154.5 lb  Date of Birth:  1971/06/26                BSA:          1.832 m  Patient Age:    50 years                BP:           69/54 mmHg  Patient Gender: M                       HR:           110 bpm.  Exam Location:  Inpatient   Procedure: 2D Echo, Cardiac Doppler and Color Doppler   Indications:    Cardiac Arrest I46.9     History:        Patient has no prior history of Echocardiogram  examinations.     Sonographer:    [id number] Dance  Referring Phys: [id number] RAHUL P Aracelli Woloszyn  Sonographer Comments: Echo performed with patient supine and on artificial  respirator. Reading Cardiologist notified.  IMPRESSIONS     1. Minor contractile sparing of the lateral wall. Left ventricular  ejection fraction, by estimation, is <20%. The left ventricle has severely  decreased function. The left ventricle demonstrates global hypokinesis.  Left ventricular diastolic parameters  are consistent with Grade III diastolic dysfunction (restrictive).   2. Right ventricular systolic function is normal. The right ventricular  size is normal. There is mildly elevated pulmonary artery systolic  pressure. The estimated right ventricular systolic pressure is 40.8 mmHg.   3. A small pericardial effusion is present. The pericardial effusion is  circumferential. There is no evidence of cardiac tamponade.   4. The mitral valve is normal in structure. Trivial mitral valve   regurgitation. No evidence of mitral stenosis.   5. The aortic valve is normal in structure. Aortic valve regurgitation is  not visualized. No aortic stenosis is present.   6. The inferior vena cava is dilated in size with <50% respiratory  variability, suggesting right atrial pressure of 15 mmHg.   FINDINGS   Left Ventricle: Minor contractile sparing of the lateral wall. Left  ventricular ejection fraction, by estimation, is <20%. The left ventricle  has severely decreased function. The left ventricle demonstrates global  hypokinesis. The left ventricular  internal cavity size was normal in size. There is no left ventricular  hypertrophy. Left ventricular diastolic parameters are consistent with  Grade III diastolic dysfunction (restrictive).   Right Ventricle: The right ventricular size is normal. No increase in  right ventricular wall thickness. Right ventricular systolic function is  normal. There is mildly elevated pulmonary artery systolic pressure. The  tricuspid regurgitant velocity is 2.54   m/s, and with an assumed right atrial pressure of 15 mmHg, the estimated  right ventricular systolic pressure is 40.8 mmHg.   Left Atrium: Left atrial size was normal in size.   Right Atrium: Right atrial size was normal in size.   Pericardium: A small pericardial effusion is present. The pericardial  effusion is circumferential. There is no evidence of cardiac tamponade.   Mitral Valve: The mitral valve is normal in structure. Trivial mitral  valve regurgitation. No evidence of mitral valve stenosis.   Tricuspid Valve: The tricuspid valve is normal in structure. Tricuspid  valve regurgitation is not demonstrated. No evidence of tricuspid  stenosis.   Aortic Valve: The aortic valve is normal in structure. Aortic valve  regurgitation is not visualized. No aortic stenosis is present.   Pulmonic Valve: The pulmonic valve was normal in structure. Pulmonic valve  regurgitation is  trivial. No evidence of pulmonic stenosis.   Aorta: The aortic root is normal in size and structure.   Venous: The inferior vena cava is dilated in size with less than 50%  respiratory variability, suggesting right atrial pressure of 15 mmHg.   IAS/Shunts: No atrial level shunt detected by color flow Doppler.     Diastology  LV e' medial:   2.08 cm/s  LV E/e' medial: 28.9    RIGHT VENTRICLE            IVC  RV Basal diam:  3.30 cm    IVC diam: 2.30 cm  RV Mid diam:    2.70 cm  RV S prime:     5.06 cm/s  TAPSE (M-mode): 0.9 cm   LEFT ATRIUM             Index  RIGHT ATRIUM           Index  LA Vol (A2C):   50.8 ml 27.74 ml/m RA Area:     12.50 cm  LA Vol (A4C):   37.5 ml 20.47 ml/m RA Volume:   31.20 ml  17.03 ml/m  LA Biplane Vol: 44.6 ml 24.35 ml/m   AORTIC VALVE  LVOT Vmax:   30.30 cm/s  LVOT Vmean:  22.250 cm/s  LVOT VTI:    0.037 m     AORTA  Ao Asc diam: 2.70 cm   MITRAL VALVE               TRICUSPID VALVE  MV Area (PHT): 3.66 cm    TR Peak grad:   25.8 mmHg  MV Decel Time: 207 msec    TR Vmax:        254.00 cm/s  MV E velocity: 60.10 cm/s  MV A velocity: 41.70 cm/s  SHUNTS  MV E/A ratio:  1.44        Systemic VTI: 0.04 m DG Abd 1 View  Result Date: 05/08/2021 CLINICAL DATA:  Orogastric tube advancement EXAM: ABDOMEN - 1 VIEW COMPARISON:  05/08/2021 FINDINGS: Limited radiograph of the lower chest and upper abdomen was obtained for the purposes of enteric tube localization. Orogastric tube is seen coursing below the diaphragm with distal tip and side port terminating within the expected location of the gastric body. Additional large bore feeding tube remains in place with distal tip terminating at the level of the proximal jejunum. Patchy bibasilar airspace opacities. IMPRESSION: 1. Orogastric tube now terminates within the expected location of the gastric body. 2. Patchy bibasilar airspace opacities. Electronically Signed   By: Duanne Guess D.O.   On:  05/08/2021 16:13   DG Abd Portable 1V  Result Date: 05/08/2021 CLINICAL DATA:  Orogastric tube placement. EXAM: PORTABLE ABDOMEN - 1 VIEW COMPARISON:  May 05, 2021. FINDINGS: The bowel gas pattern is normal. Distal tip of feeding tube is seen in expected position of proximal jejunum. IMPRESSION: Distal tip of feeding tube seen in expected position of proximal jejunum. Electronically Signed   By: Lupita Raider M.D.   On: 05/08/2021 14:45     Micro Data:  MRSA swab: Negative Resp Panel: COVID, FLU negative  Antimicrobials:  Cefepime: 8/2>8/2 Vanc: 8/2>8/2 Azithromax: 8/2>>8/4 Ceftriaxone: 8/3>>>8/8 Augmentin: 8/8>>8/10  Interim history/subjective:  O/N Events: No events ON  Intubated, no acute distress.   Objective   Blood pressure 122/83, pulse 96, temperature 99 F (37.2 C), temperature source Axillary, resp. rate 19, height 5\' 8"  (1.727 m), weight 66.2 kg, SpO2 91 %.    Vent Mode: PRVC FiO2 (%):  [40 %] 40 % Set Rate:  [14 bmp] 14 bmp Vt Set:  [550 mL] 550 mL PEEP:  [5 cmH20] 5 cmH20 Pressure Support:  [12 cmH20] 12 cmH20 Plateau Pressure:  [18 cmH20-25 cmH20] 21 cmH20   Intake/Output Summary (Last 24 hours) at 05/09/2021 0706 Last data filed at 05/09/2021 0615 Gross per 24 hour  Intake 3294.41 ml  Output 1625 ml  Net 1669.41 ml    Filed Weights   05/06/21 0600 05/07/21 0600 05/08/21 0600  Weight: 67 kg 66.8 kg 66.2 kg      Physical Exam Constitutional:      Comments: Intubated, non participatory   Cardiovascular:     Rate and Rhythm: Normal rate and regular rhythm.     Pulses: Normal pulses.     Heart sounds: Normal heart sounds.  No murmur heard.   No friction rub. No gallop.  Pulmonary:     Effort: Pulmonary effort is normal.     Breath sounds: Normal breath sounds. No wheezing, rhonchi or rales.  Abdominal:     General: Abdomen is flat. There is distension.     Palpations: Abdomen is soft.     Tenderness: There is no abdominal tenderness.      Comments: Distended, less tense than yesterday, hypoactive bowel sounds.   Musculoskeletal:     Right lower leg: No edema.     Left lower leg: No edema.  Skin:    Findings: No lesion or rash.     Resolved Hospital Problem list     Assessment & Plan:  Cardiogenic Shock PEA Cardiac Arrest Heart Failure with Reduced Ejection Fraction:  Repeat Limited Echo showing EF of 20-25%. Off milrinone. Currently holding GDMT given recent milrinone use.  - Goal of K>4.0 and magnesium >2.0. Replete as necessary   Acute Hypoxemic Respiratory Failure Requiring MV:  Pneumonia:  No febrile events O/N, Completed full course of antibiotics. 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 Family discussion 8/10 significant for continued care, but CODE STATUS was changed to DNR. Will likely need tracheostomy and PEG tube early next week.  - Appreciate Neurology's assistance  - Continue Keppra IV or PO - Continue Klonopin PRN for myoclonus - No other sedating medications at this time   Acute Kidney Injury:  In setting of cardiogenic shock and PEA arrest. Kidney function continues to improve.  - Trend BMP   Ileus/Bowel Obstruction: Bowel still slightly distended less tympanic, hypoactive BS, Replenishing K this AM. No significant output from OG tube ON.  - Continue TF - Continue OG on low intermittent suction   Diabetes mellitus Type II: - Continue SSI   Lung Mass: 2.1x2.6cm nodular density posteriorly in the left upper lobe - Will need outpatient follow up if survives this hospitalization  Anemia - Monitor  Best practice:  Diet/type: tubefeeds DVT prophylaxis: prophylactic heparin  GI prophylaxis:  PPI Lines: Central line Foley: Yes, and it is still needed Code Status: DNR Last date of multidisciplinary goals of care discussion 8/10 discussed with patient's sister and with Dr. Wilford Corner in neurology. Patient made DNR but to continue all aggressive care.  Family Communication:will update today Disposition: continues to require ICU.   Labs   CBC: Recent Labs  Lab 05/04/21 0021 05/05/21 0304 05/06/21 0505 05/07/21 0614 05/08/21 0317  WBC 9.4 9.1 10.9* 9.0 10.9*  HGB 11.0* 11.0* 11.7* 11.6* 13.5  HCT 33.7* 32.9* 35.3* 35.9* 42.5  MCV 87.3 85.9 86.5 87.6 89.5  PLT 349 388 471* 430* 493*     Basic Metabolic Panel: Recent Labs  Lab 05/03/21 0500 05/04/21 0021 05/05/21 0304 05/05/21 1031 05/06/21 0505 05/06/21 0511 05/07/21 0317 05/07/21 1725 05/08/21 0317 05/08/21 0908 05/09/21 0437  NA 137 137 142  --  146*  --  145  --  145  --  143  K 4.5 3.9 3.4*   < > 3.5  --  3.0* 4.1 3.8  --  3.8  CL 101 100 102  --  103  --  107  --  111  --  110  CO2 24 26 28   --  27  --  25  --  27  --  25  GLUCOSE 141* 113* 127*  --  92  --  103*  --  114*  --  115*  BUN 57* 58* 60*  --  55*  --  44*  --  37*  --  36*  CREATININE 2.47* 2.12* 2.21*  --  2.51*  --  2.22*  --  1.82*  --  1.63*  CALCIUM 8.6* 9.1 9.0  --  9.1  --  8.4*  --  8.7*  --  8.9  MG 2.9* 2.6* 2.5*  --   --  2.4 2.7*  --   --  2.5*  --   PHOS 4.3 4.8* 4.5  --   --   --  3.7  --   --   --   --    < > = values in this interval not displayed.    GFR: Estimated Creatinine Clearance: 50.2 mL/min (A) (by C-G formula based on SCr of 1.63 mg/dL (H)). Recent Labs  Lab 05/04/21 0939 05/05/21 0304 05/06/21 0505 05/07/21 0614 05/08/21 0317  WBC  --  9.1 10.9* 9.0 10.9*  LATICACIDVEN 1.2  --   --   --   --      Liver Function Tests: No results for input(s): AST, ALT, ALKPHOS, BILITOT, PROT, ALBUMIN in the last 168 hours.  No results for input(s): LIPASE, AMYLASE in the last 168 hours. No results for input(s): AMMONIA  in the last 168 hours.  ABG    Component Value Date/Time   PHART 7.494 (H) 05/02/2021 1822   PCO2ART 30.6 (L) 05/02/2021 1822   PO2ART 95 05/02/2021 1822   HCO3 23.5 05/02/2021 1822   TCO2 24 05/02/2021 1822   ACIDBASEDEF 9.0 (H) 04/30/2021 0815   O2SAT 70.2 05/08/2021 0317      Coagulation Profile: No results for input(s): INR, PROTIME in the last 168 hours.   Cardiac Enzymes: No results for input(s): CKTOTAL, CKMB, CKMBINDEX, TROPONINI in the last 168 hours.  HbA1C: Hgb A1c MFr Bld  Date/Time Value Ref Range Status  04/30/2021 12:22 AM 5.8 (H) 4.8 - 5.6 % Final    Comment:    (NOTE) Pre diabetes:          5.7%-6.4%  Diabetes:              >6.4%  Glycemic control for   <7.0% adults with diabetes     CBG: Recent Labs  Lab 05/08/21 1116 05/08/21 1527 05/08/21 1926 05/08/21 2319 05/09/21 0322  GLUCAP 116* 117* 106* 113* 125*      07/09/21, MD IMTS, PGY-3 Pager: [phone] 05/09/2021,7:06 AM   Attending Attestation  I agree with the Resident's note, impression, and recommendations as outlined. I have taken an independent interval history, reviewed the chart and examined the patient.  My medical decision making is as follows:   This is a 50 y.o. man with OOH cardiac arrest s/p TTM now with anoxic encephalopathy and resultant respiratory failure and cardiogenic shock. Shock is now resolved. May try to fold in GDMT as BP allows, but will go slowly given he just came off milrinone. Will discontinue central line today. Still having myoclonus responsive to clonazepam. Continue PS trials as tolerated. He will need trach and peg next week to help with disposition.   The patient is critically ill due to respiratory failure, anoxic encephalopathy.  Critical care was necessary to  treat or prevent imminent or life-threatening deterioration.  Critical care was time spent personally by me on the following activities: development of treatment plan with patient and/or  surrogate as well as nursing, discussions with consultants, evaluation of patient's response to treatment, examination of patient, obtaining history from patient or surrogate, ordering and performing treatments and interventions, ordering and review of laboratory studies, ordering and review of radiographic studies, pulse oximetry, re-evaluation of patient's condition and participation in multidisciplinary rounds.   Critical Care Time devoted to patient care services described in this note is 32 minutes. This time reflects time of care of this signee Charlott HollerNikita S Eugine Bubb . This critical care time does not reflect separately billable procedures or procedure time, teaching time or supervisory time of PA/NP/Med student/Med Resident etc but could involve care discussion time.       Charlott HollerNikita S Hollee [facility] 05/09/2021 1:33 PM  Pager: see AMION  If no response to pager, please call critical care on call (see AMION) until 7pm After 7:00 pm call Elink

## 2021-05-09 NOTE — Progress Notes (Signed)
VAST consult received to obtain IV access X2 so that CVC can be discontinued.  SecureChat sent to Misty Stanley, RN regarding IV placement. Pt currently has one IV in place and the only IV med currently is Keppra. Educated it is best practice to place access when it is actually needed and not "just in case" to allow for vein preservation and decreased risk of infection from unnecessary sticks/lines. Advised unit RN, Misty Stanley to reach out with any questions or further needs.

## 2021-05-09 NOTE — Progress Notes (Signed)
RT note:  Pt placed on PS/CPAP 12/5. Pt is tolerating well. RT will continue to monitor.

## 2021-05-10 DIAGNOSIS — R569 Unspecified convulsions: Secondary | ICD-10-CM

## 2021-05-10 DIAGNOSIS — G931 Anoxic brain damage, not elsewhere classified: Secondary | ICD-10-CM | POA: Diagnosis not present

## 2021-05-10 DIAGNOSIS — N179 Acute kidney failure, unspecified: Secondary | ICD-10-CM | POA: Diagnosis not present

## 2021-05-10 DIAGNOSIS — J9601 Acute respiratory failure with hypoxia: Secondary | ICD-10-CM | POA: Diagnosis not present

## 2021-05-10 DIAGNOSIS — I469 Cardiac arrest, cause unspecified: Secondary | ICD-10-CM | POA: Diagnosis not present

## 2021-05-10 LAB — BASIC METABOLIC PANEL
Anion gap: 8 (ref 5–15)
BUN: 34 mg/dL — ABNORMAL HIGH (ref 6–20)
CO2: 25 mmol/L (ref 22–32)
Calcium: 8.9 mg/dL (ref 8.9–10.3)
Chloride: 107 mmol/L (ref 98–111)
Creatinine, Ser: 1.58 mg/dL — ABNORMAL HIGH (ref 0.61–1.24)
GFR, Estimated: 53 mL/min — ABNORMAL LOW (ref >60)
Glucose, Bld: 116 mg/dL — ABNORMAL HIGH (ref 70–99)
Potassium: 5 mmol/L (ref 3.5–5.1)
Sodium: 140 mmol/L (ref 135–145)

## 2021-05-10 LAB — CBC
HCT: 38 % — ABNORMAL LOW (ref 39.0–52.0)
Hemoglobin: 12.3 g/dL — ABNORMAL LOW (ref 13.0–17.0)
MCH: 28.7 pg (ref 26.0–34.0)
MCHC: 32.4 g/dL (ref 30.0–36.0)
MCV: 88.6 fL (ref 80.0–100.0)
Platelets: 482 10*3/uL — ABNORMAL HIGH (ref 150–400)
RBC: 4.29 MIL/uL (ref 4.22–5.81)
RDW: 14.4 % (ref 11.5–15.5)
WBC: 12.8 10*3/uL — ABNORMAL HIGH (ref 4.0–10.5)
nRBC: 0 % (ref 0.0–0.2)

## 2021-05-10 LAB — GLUCOSE, CAPILLARY
Glucose-Capillary: 113 mg/dL — ABNORMAL HIGH (ref 70–99)
Glucose-Capillary: 109 mg/dL — ABNORMAL HIGH (ref 70–99)
Glucose-Capillary: 121 mg/dL — ABNORMAL HIGH (ref 70–99)
Glucose-Capillary: 104 mg/dL — ABNORMAL HIGH (ref 70–99)
Glucose-Capillary: 114 mg/dL — ABNORMAL HIGH (ref 70–99)
Glucose-Capillary: 119 mg/dL — ABNORMAL HIGH (ref 70–99)

## 2021-05-10 MED ORDER — CHLORHEXIDINE GLUCONATE CLOTH 2 % EX PADS
6.0000 | MEDICATED_PAD | Freq: Every day | CUTANEOUS | Status: DC
  Administered 2021-05-11: 6 via TOPICAL

## 2021-05-10 MED ORDER — METOPROLOL TARTRATE 25 MG/10 ML ORAL SUSPENSION
12.5000 mg | Freq: Two times a day (BID) | ORAL | Status: DC
  Administered 2021-05-10 – 2021-05-12 (×6): 12.5 mg
  Filled 2021-05-10 (×6): qty 10

## 2021-05-10 MED ORDER — POLYETHYLENE GLYCOL 3350 17 G PO PACK
17.0000 g | PACK | Freq: Every day | ORAL | Status: DC
  Administered 2021-05-10 – 2021-05-11 (×2): 17 g
  Filled 2021-05-10 (×2): qty 1

## 2021-05-10 MED ORDER — CHLORHEXIDINE GLUCONATE CLOTH 2 % EX PADS
6.0000 | MEDICATED_PAD | Freq: Every day | CUTANEOUS | Status: DC
  Administered 2021-05-10: 6 via TOPICAL

## 2021-05-10 NOTE — Progress Notes (Signed)
NAME:  Vincent [name], MRN:  [id number], DOB:  06-30-1971, LOS: 11 ADMISSION DATE:  04/29/2021, CONSULTATION DATE:  04/29/21 REFERRING MD:  Dr. Jacqulyn Bath, CHIEF COMPLAINT:  Found Down   History of present illness   50 y/o M, admitted to ICU after PEA arrest, likely respiratory driven.  ED note reviewed.  History unobtainable for patient as he is intubated and sedated.  History per chart and sister at bedside.  Patient was not acting right, relatively unresponsive, EMS called.  Found to be agonal breathing.  Unclear initial pulse ox was.  Eventually lost pulse.  CPR was initiated.  1 dose of epi.  About 6 months CPR.  ROSC obtained.  King airway placed.  Transported to the ED.  King airway exchanged for ET tube.  Noted to be hypotensive so norepinephrine started peripherally.  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  CTA PE protocol monitor patient reveals no PE, significant bilateral airspace disease was dense consolidations in the lower lobes, significant emphysema in upper lobes with interstitial thickening felt to be most consistent with pneumonitis/infection versus volume overload.  Moderate pericardial effusion noted by the radiologist.  Other findings as below.  He was given broad-spectrum antibiotics.  Initial lactate over 10.  1 L crystalloid given.  At time of evaluation he is on fentanyl drip and propofol.  Past Medical History  Tobacco Use Emphysema CVA DM  Significant Hospital Events   8/2: PEA Arrest, King airway in the field, CPR x6 minutes and epi x1, ROSC, Intubated in ED, Admitted to PCCM  8/4: Weaned off TTM, [place] O2 sat of 47%, started on milrinone and lasix 8/5 milrinone increased 8/6 TF stopped, OG placed to suction for ileus 8/8 Post pyloric Cortrak placed, myoclonic jerking, started Keppra, depakote 8/10 Completed antibiotic course for pneumonia. Family meeting transitioned to DNR.   Procedures:  [facility]: 8/3 d/ced on 8/12 Cortrak: 8/8  Significant Diagnostic  Tests:   CT HEAD WO CONTRAST ( )   Result Date: 04/29/2021 CLINICAL DATA:  Mental status change EXAM: CT HEAD WITHOUT CONTRAST TECHNIQUE: Contiguous axial images were obtained from the base of the skull through the vertex without intravenous contrast. COMPARISON:  None. FINDINGS: Brain: No acute territorial infarction, hemorrhage or intracranial mass is visualized. Chronic right MCA infarct with extensive encephalomalacia involving the right frontal, parietal and temporal lobes as well as the right thalamus and basal ganglia. Moderate atrophy. Ex vacuo dilatation of right lateral ventricle. Atrophy of right brainstem. Vascular: No hyperdense vessels.  Carotid vascular calcification Skull: Normal. Negative for fracture or focal lesion. Sinuses/Orbits: Mucosal thickening in the sinuses. Chronic appearing deformity of the medial wall right orbit. Other: Incomplete fusion posterior arch of C1 IMPRESSION: 1. No definite CT evidence for acute intracranial abnormality. 2. Atrophy and chronic right MCA infarct. Electronically Signed   By: Jasmine Pang M.D.   On: 04/29/2021 22:09   CT Angio Chest PE W and/or Wo Contrast   Result Date: 04/29/2021 CLINICAL DATA:  PE suspected, high prob Post CPR. EXAM: CT ANGIOGRAPHY CHEST WITH CONTRAST TECHNIQUE: Multidetector CT imaging of the chest was performed using the standard protocol during bolus administration of intravenous contrast. Multiplanar CT image reconstructions and MIPs were obtained to evaluate the vascular anatomy. CONTRAST:  50mL OMNIPAQUE IOHEXOL 350 MG/ML SOLN COMPARISON:  Chest radiograph earlier today. FINDINGS: Cardiovascular: There are no filling defects within the pulmonary arteries to suggest pulmonary embolus. Mild aortic atherosclerosis. Cannot assess for dissection given phase of contrast tailored to pulmonary  arteries S1. Multi chamber cardiomegaly. Minimal contrast refluxes into the hepatic veins and IVC. Moderate size circumferential pericardial  effusion. This measures up to 17 mm in depth adjacent to the right ventricle. Mediastinum/Nodes: Shotty mediastinal adenopathy, including right anterior paratracheal node measuring 10 mm, series 5, image 37. Bilateral hilar lymph nodes measuring 9-10 mm. The esophagus is decompressed by enteric tube. No visualized thyroid nodule. Lungs/Pleura: The endotracheal tube tip is at the level of the carina, recommend retraction of 2-3 cm. Dense lower lobe consolidation, left greater than right, suspicious for aspiration. There additional patchy, ground-glass and confluent airspace opacities throughout both lungs. Mild smooth septal thickening. Underlying emphysema which is partially obscured by superimposed airspace disease. There is a 2.1 x 2.6 cm nodular density posteriorly in the left upper lobe abutting the pleura, series 6, image 26, partially obscured by adjacent airspace disease. Small bilateral pleural effusions, as well as fluid tracking into the right minor fissure. No pneumothorax. Upper Abdomen: No adrenal nodule. No acute upper abdominal findings. Probable scarring in the upper left kidney. Motion obscures evaluation of the upper abdomen. Musculoskeletal: No acute osseous abnormality. No anterior rib fractures typically seen with CPR. No focal bone lesion. Review of the MIP images confirms the above findings. IMPRESSION: 1. No pulmonary embolus. 2. Multi chamber cardiomegaly with moderate circumferential pericardial effusion. Minimal contrast refluxes into the hepatic veins and IVC consistent with elevated right heart pressures. 3. Dense lower lobe consolidation, left greater than right, suspicious for aspiration. Small bilateral pleural effusions. 4. There is set the thickening in ground-glass opacities suspicious for pulmonary edema. Superimposed airspace disease within there is a ground-glass opacity may represent confluent edema or infection. 5. Shotty mediastinal and hilar adenopathy is likely reactive,  but nonspecific. 6. Endotracheal tube tip is at the level of the carina, recommend retraction of 2-3 cm. 7. A 2.1 x 2.6 cm nodular density posteriorly in the left upper lobe abutting the pleura is nonspecific given the adjacent parenchymal findings, however recommend attention at follow-up to exclude the possibility of pulmonary mass. Aortic Atherosclerosis (ICD10-I70.0) and Emphysema (ICD10-J43.9). Electronically Signed   By: Narda Rutherford M.D.   On: 04/29/2021 22:02   DG Chest Portable 1 View   Result Date: 04/29/2021 CLINICAL DATA:  Post CPR.  Cardiac arrest EXAM: PORTABLE CHEST 1 VIEW COMPARISON:  None FINDINGS: Endotracheal tube terminates 2.2 cm above carina. External pacer/defibrillator is. Midline trachea. Mild cardiomegaly. No pleural effusion or pneumothorax. Interstitial and airspace disease is relatively diffuse but greater on the left than right. IMPRESSION: Appropriate position of endotracheal tube. Cardiomegaly with left greater than right interstitial and airspace disease. Favor asymmetric pulmonary edema. Given asymmetry, aspiration is possible but felt less likely. Electronically Signed   By: Jeronimo Greaves M.D.   On: 04/29/2021 20:53   EEG adult   Result Date: 04/30/2021 Rejeana Brock, MD     04/30/2021  2:15 AM History: 50 year old male status post cardiac arrest Sedation: Propofol Technique: This is a 21 channel routine scalp EEG performed at the bedside with bipolar and monopolar montages arranged in accordance to the international 10/20 system of electrode placement. One channel was dedicated to EKG recording. Background: The background is diffusely attenuated with ventilator artifact.  There is some degree of muscle artifact throughout most of the recording, but no definite background activity is seen.  He has several episodes of "shaking" without definite EEG change, other than significant muscle artifact.  With one of these episodes muscle artifact does significantly obscure  the  background, but to a degree I can tell there was no significant EEG change. Photic stimulation: Physiologic driving is not performed EEG Abnormalities: Diffusely attenuated background Clinical Interpretation: This EEG is severely abnormal with diffuse attenuation of the background.  There was no evidence of the muscle jerking seen was epileptiform in nature.  There was no seizure or seizure predisposition recorded on this study. Please note that lack of epileptiform activity on EEG does not preclude the possibility of epilepsy. Ritta SlotMcNeill Kirkpatrick, MD [facility] [phone] If 7pm- 7am, please page neurology on call as listed in AMION.   Overnight EEG with video   Result Date: 04/30/2021 Charlsie QuestYadav, Priyanka O, MD     04/30/2021  9:03 AM Patient Name: Leanne ChangRedric Shawn Lounsbury MRN: [id number] Epilepsy Attending: Charlsie QuestPriyanka O Yadav Referring Physician/Provider: Rutherford Guysahul Wilhemenia Camba, PA Duration: 04/30/2021 0202 to 04/30/2021 0900 Patient history: 50 year old male status post cardiac arrest. EEG to evaluate for seizure Level of alertness:  comatose AEDs during EEG study: LEV, propofol Technical aspects: This EEG study was done with scalp electrodes positioned according to the 10-20 International system of electrode placement. Electrical activity was acquired at a sampling rate of 500Hz  and reviewed with a high frequency filter of 70Hz  and a low frequency filter of 1Hz . EEG data were recorded continuously and digitally stored. Description: EEG showed continuous generalized background suppression.  EEG was not reactive to tactile stimulation.  Event button was pressed on 04/30/2021 at 0752 for tremors in arms and chest. Concomitant EEG before, during and after the event did not show any EEG changes suggest seizure. Hyperventilation and photic stimulation were not performed.   ABNORMALITY -Background suppression, generalized IMPRESSION: This study is suggestive of profound diffuse encephalopathy, nonspecific to etiology.   However with a history of cardiac arrest this could be secondary to anoxic/hypoxic brain injury, sedation.  No seizures or epileptiform discharges were seen throughout the recording. Event button was pressed on 04/30/2021 at 0752 for tremors in arms and chest without concomitant EEG change. This was most likely not an epileptic event. Charlsie QuestPriyanka O Yadav        ECHOCARDIOGRAM REPORT [facility] of Exam: 04/30/2021  Medical Rec #:  [id number]               Height:       68.0 in  Accession #:    [id number]              Weight:       154.5 lb  Date of Birth:  11/16/1970                BSA:          1.832 m  Patient Age:    50 years                BP:           69/54 mmHg  Patient Gender: M                       HR:           110 bpm.  Exam Location:  Inpatient   Procedure: 2D Echo, Cardiac Doppler and Color Doppler   Indications:    Cardiac Arrest I46.9     History:        Patient has no prior history of Echocardiogram  examinations.     Sonographer:    Elmarie Shileyiffany Dance  Referring Phys: [id number] RAHUL P Rayanne Padmanabhan  Sonographer Comments: Echo performed with patient supine and on artificial  respirator. Reading Cardiologist notified.  IMPRESSIONS     1. Minor contractile sparing of the lateral wall. Left ventricular  ejection fraction, by estimation, is <20%. The left ventricle has severely  decreased function. The left ventricle demonstrates global hypokinesis.  Left ventricular diastolic parameters  are consistent with Grade III diastolic dysfunction (restrictive).   2. Right ventricular systolic function is normal. The right ventricular  size is normal. There is mildly elevated pulmonary artery systolic  pressure. The estimated right ventricular systolic pressure is 40.8 mmHg.   3. A small pericardial effusion is present. The pericardial effusion is  circumferential. There is no evidence of cardiac tamponade.   4. The mitral valve is normal in structure. Trivial  mitral valve  regurgitation. No evidence of mitral stenosis.   5. The aortic valve is normal in structure. Aortic valve regurgitation is  not visualized. No aortic stenosis is present.   6. The inferior vena cava is dilated in size with <50% respiratory  variability, suggesting right atrial pressure of 15 mmHg.   FINDINGS   Left Ventricle: Minor contractile sparing of the lateral wall. Left  ventricular ejection fraction, by estimation, is <20%. The left ventricle  has severely decreased function. The left ventricle demonstrates global  hypokinesis. The left ventricular  internal cavity size was normal in size. There is no left ventricular  hypertrophy. Left ventricular diastolic parameters are consistent with  Grade III diastolic dysfunction (restrictive).   Right Ventricle: The right ventricular size is normal. No increase in  right ventricular wall thickness. Right ventricular systolic function is  normal. There is mildly elevated pulmonary artery systolic pressure. The  tricuspid regurgitant velocity is 2.54   m/s, and with an assumed right atrial pressure of 15 mmHg, the estimated  right ventricular systolic pressure is 40.8 mmHg.   Left Atrium: Left atrial size was normal in size.   Right Atrium: Right atrial size was normal in size.   Pericardium: A small pericardial effusion is present. The pericardial  effusion is circumferential. There is no evidence of cardiac tamponade.   Mitral Valve: The mitral valve is normal in structure. Trivial mitral  valve regurgitation. No evidence of mitral valve stenosis.   Tricuspid Valve: The tricuspid valve is normal in structure. Tricuspid  valve regurgitation is not demonstrated. No evidence of tricuspid  stenosis.   Aortic Valve: The aortic valve is normal in structure. Aortic valve  regurgitation is not visualized. No aortic stenosis is present.   Pulmonic Valve: The pulmonic valve was normal in structure. Pulmonic valve   regurgitation is trivial. No evidence of pulmonic stenosis.   Aorta: The aortic root is normal in size and structure.   Venous: The inferior vena cava is dilated in size with less than 50%  respiratory variability, suggesting right atrial pressure of 15 mmHg.   IAS/Shunts: No atrial level shunt detected by color flow Doppler.     Diastology  LV e' medial:   2.08 cm/s  LV E/e' medial: 28.9    RIGHT VENTRICLE            IVC  RV Basal diam:  3.30 cm    IVC diam: 2.30 cm  RV Mid diam:    2.70 cm  RV S prime:     5.06 cm/s  TAPSE (M-mode): 0.9 cm   LEFT ATRIUM             Index  RIGHT ATRIUM           Index  LA Vol (A2C):   50.8 ml 27.74 ml/m RA Area:     12.50 cm  LA Vol (A4C):   37.5 ml 20.47 ml/m RA Volume:   31.20 ml  17.03 ml/m  LA Biplane Vol: 44.6 ml 24.35 ml/m   AORTIC VALVE  LVOT Vmax:   30.30 cm/s  LVOT Vmean:  22.250 cm/s  LVOT VTI:    0.037 m     AORTA  Ao Asc diam: 2.70 cm   MITRAL VALVE               TRICUSPID VALVE  MV Area (PHT): 3.66 cm    TR Peak grad:   25.8 mmHg  MV Decel Time: 207 msec    TR Vmax:        254.00 cm/s  MV E velocity: 60.10 cm/s  MV A velocity: 41.70 cm/s  SHUNTS  MV E/A ratio:  1.44        Systemic VTI: 0.04 m No results found.   Micro Data:  MRSA swab: Negative Resp Panel: COVID, FLU negative  Antimicrobials:  Cefepime: 8/2>8/2 Vanc: 8/2>8/2 Azithromax: 8/2>>8/4 Ceftriaxone: 8/3>>>8/8 Augmentin: 8/8>>8/10  Interim history/subjective:   had some myoclonus overnight without stimulation. Was given versed and klonopin.   Objective   Blood pressure 118/84, pulse 91, temperature 98.4 F (36.9 C), temperature source Oral, resp. rate 20, height 5\' 8"  (1.727 m), weight 66.1 kg, SpO2 97 %.    Vent Mode: PSV;CPAP FiO2 (%):  [40 %] 40 % Set Rate:  [14 bmp] 14 bmp Vt Set:  [550 mL] 550 mL PEEP:  [5 cmH20] 5 cmH20 Pressure Support:  [12 cmH20] 12 cmH20 Plateau Pressure:  [16 cmH20-21 cmH20] 20 cmH20   Intake/Output  Summary (Last 24 hours) at 05/10/2021 0846 Last data filed at 05/10/2021 0800 Gross per 24 hour  Intake 3832 ml  Output 1336 ml  Net 2496 ml    Filed Weights   05/07/21 0600 05/08/21 0600 05/10/21 0344  Weight: 66.8 kg 66.2 kg 66.1 kg   Chronically ill ETT to vent Minimally responsive Breathing nonlabored with clear lung Abdomen soft, nontender   Labs reviewed CBG 109 K 5.0 Na 140 Cr 1.58 Hgb 12.3 WBC 12.8   Resolved Hospital Problem list     Assessment & Plan:   Cardiogenic Shock PEA Cardiac Arrest Heart Failure with Reduced Ejection Fraction:  Repeat Limited Echo showing EF of 20-25%. Off milrinone.  Start metoprolol 12.5 mg today - Goal of K>4.0 and magnesium >2.0. Replete as necessary   Acute Hypoxemic Respiratory Failure Requiring MV:  HAP:  - Continue mechanical ventilatory support - Continue daily SBT and SAT as tolerated - VAP protocol  - completed abx.  - plan for trach next week.   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 Family discussion 8/10 significant for continued care, but CODE STATUS was changed to DNR. Will likely need tracheostomy and PEG tube early next week.  - Appreciate Neurology's assistance  - Continue Keppra IV or PO - Continue Klonopin PRN for myoclonus. Stop prn versed.  - No other sedating medications at this time   Acute Kidney Injury:  In setting of cardiogenic shock and PEA arrest. Kidney function continues to improve.  - Trend BMP. Unclear baseline but suspect  CKD   Ileus/Bowel Obstruction: - improving, had BM overnight and yesterday - stop senna, miralax decrease to daily   Diabetes mellitus Type II: - Continue SSI   Lung Mass: 2.1x2.6cm nodular density posteriorly in the left upper lobe - Will need outpatient follow up if survives this  hospitalization  Anemia - Monitor  Best practice:  Diet/type: tubefeeds. Will need peg next week.  DVT prophylaxis: prophylactic heparin  GI prophylaxis: PPI Lines: none.  Foley: Yes, and it is still needed Code Status: DNR Last date of multidisciplinary goals of care discussion 8/10 discussed with patient's sister and with Dr. Wilford Corner in neurology. Patient made DNR but to continue all aggressive care.  Family Communication:will update today Disposition: continues to require ICU.   Labs   CBC: Recent Labs  Lab 05/06/21 0505 05/07/21 0614 05/08/21 0317 05/09/21 0705 05/10/21 0145  WBC 10.9* 9.0 10.9* 13.2* 12.8*  HGB 11.7* 11.6* 13.5 13.0 12.3*  HCT 35.3* 35.9* 42.5 40.6 38.0*  MCV 86.5 87.6 89.5 89.6 88.6  PLT 471* 430* 493* 487* 482*     Basic Metabolic Panel: Recent Labs  Lab 05/04/21 0021 05/05/21 0304 05/05/21 1031 05/06/21 0505 05/06/21 0511 05/07/21 0317 05/07/21 1725 05/08/21 0317 05/08/21 0908 05/09/21 0437 05/09/21 0705 05/10/21 0145  NA 137 142  --  146*  --  145  --  145  --  143  --  140  K 3.9 3.4*   < > 3.5  --  3.0* 4.1 3.8  --  3.8  --  5.0  CL 100 102  --  103  --  107  --  111  --  110  --  107  CO2 26 28  --  27  --  25  --  27  --  25  --  25  GLUCOSE 113* 127*  --  92  --  103*  --  114*  --  115*  --  116*  BUN 58* 60*  --  55*  --  44*  --  37*  --  36*  --  34*  CREATININE 2.12* 2.21*  --  2.51*  --  2.22*  --  1.82*  --  1.63*  --  1.58*  CALCIUM 9.1 9.0  --  9.1  --  8.4*  --  8.7*  --  8.9  --  8.9  MG 2.6* 2.5*  --   --  2.4 2.7*  --   --  2.5*  --  2.6*  --   PHOS 4.8* 4.5  --   --   --  3.7  --   --   --   --   --   --    < > = values in this interval not displayed.    GFR: Estimated Creatinine Clearance: 51.7 mL/min (A) (by C-G formula based on SCr of 1.58 mg/dL (H)). Recent Labs  Lab 05/04/21 0939 05/05/21 0304 05/07/21 0614 05/08/21 0317 05/09/21 0705 05/10/21 0145  WBC  --    < > 9.0 10.9* 13.2* 12.8*   LATICACIDVEN 1.2  --   --   --   --   --    < > = values in this interval not displayed.     Liver Function Tests: No results for input(s): AST, ALT, ALKPHOS, BILITOT, PROT, ALBUMIN in the last 168 hours.  No results for input(s): LIPASE, AMYLASE in the last 168 hours. No results for input(s): AMMONIA in the last 168 hours.  ABG    Component Value Date/Time   PHART 7.494 (H) 05/02/2021 1822   PCO2ART 30.6 (L) 05/02/2021 1822   PO2ART 95 05/02/2021 1822   HCO3 23.5 05/02/2021 1822   TCO2 24 05/02/2021 1822   ACIDBASEDEF 9.0 (H) 04/30/2021 0815   O2SAT 70.2 05/08/2021 0317      Coagulation Profile: No results for input(s): INR, PROTIME in the last 168 hours.   Cardiac Enzymes: No results for input(s): CKTOTAL, CKMB, CKMBINDEX, TROPONINI in the last 168 hours.  HbA1C: Hgb A1c MFr Bld  Date/Time Value Ref Range Status  04/30/2021 12:22 AM 5.8 (H) 4.8 - 5.6 % Final    Comment:    (NOTE) Pre diabetes:          5.7%-6.4%  Diabetes:              >6.4%  Glycemic control for   <7.0% adults with diabetes     CBG: Recent Labs  Lab 05/09/21 1555 05/09/21 1924 05/09/21 2318 05/10/21 0319 05/10/21 0740  GLUCAP 112* 115* 109* 113* 109*    The patient is critically ill due to respiratory failure, cardiac arrest, anoxic encephalopathy.  Critical care was necessary to treat or prevent imminent or life-threatening deterioration.  Critical care was time spent personally by me on the following activities: development of treatment plan with patient and/or surrogate as well as nursing, discussions with consultants, evaluation of patient's response to treatment, examination of patient, obtaining history from patient or surrogate, ordering and performing treatments and interventions, ordering and review of laboratory studies, ordering and review of radiographic studies, pulse oximetry, re-evaluation of patient's condition and participation in multidisciplinary rounds.   Critical  Care Time devoted to patient care services described in this note is 32 minutes. This time reflects time of care of this signee Charlott Holler . This critical care time does not reflect separately billable procedures or procedure time, teaching time or supervisory time of PA/NP/Med student/Med Resident etc but could involve care discussion time.       Charlott Holler [facility] 05/10/2021 8:51 AM  Pager: see AMION  If no response to pager , please call critical care on call (see AMION) until 7pm After 7:00 pm call Elink

## 2021-05-10 NOTE — Progress Notes (Signed)
RT note-Patient continues to wean on vent 12/5.

## 2021-05-11 DIAGNOSIS — R569 Unspecified convulsions: Secondary | ICD-10-CM | POA: Diagnosis not present

## 2021-05-11 DIAGNOSIS — N179 Acute kidney failure, unspecified: Secondary | ICD-10-CM | POA: Diagnosis not present

## 2021-05-11 DIAGNOSIS — I469 Cardiac arrest, cause unspecified: Secondary | ICD-10-CM | POA: Diagnosis not present

## 2021-05-11 DIAGNOSIS — J9601 Acute respiratory failure with hypoxia: Secondary | ICD-10-CM | POA: Diagnosis not present

## 2021-05-11 LAB — GLUCOSE, CAPILLARY
Glucose-Capillary: 123 mg/dL — ABNORMAL HIGH (ref 70–99)
Glucose-Capillary: 114 mg/dL — ABNORMAL HIGH (ref 70–99)
Glucose-Capillary: 112 mg/dL — ABNORMAL HIGH (ref 70–99)
Glucose-Capillary: 107 mg/dL — ABNORMAL HIGH (ref 70–99)
Glucose-Capillary: 109 mg/dL — ABNORMAL HIGH (ref 70–99)
Glucose-Capillary: 116 mg/dL — ABNORMAL HIGH (ref 70–99)

## 2021-05-11 MED ORDER — CHLORHEXIDINE GLUCONATE CLOTH 2 % EX PADS
6.0000 | MEDICATED_PAD | Freq: Every day | CUTANEOUS | Status: DC
  Administered 2021-05-12 – 2021-06-11 (×29): 6 via TOPICAL

## 2021-05-11 NOTE — Progress Notes (Signed)
RT note- Placed patient back to full support due increased RR, tolerated wean today for 10 hours. PS +12/5. Continue to monitor.

## 2021-05-11 NOTE — Progress Notes (Signed)
NAME:  Vincent Vincent [name] Vincent [name] Vincent [name], MRN:  [id number], DOB:  11/11/1970, LOS: 12 ADMISSION DATE:  04/29/2021, CONSULTATION DATE:  04/29/21 REFERRING MD:  Dr. Jacqulyn Vincent [name], CHIEF COMPLAINT:  Found Down   History of present illness   50 y/o M, admitted to ICU after PEA arrest, likely respiratory driven.  ED note reviewed.  History unobtainable for patient as he is intubated and sedated.  History per chart and sister at bedside.  Patient was not acting right, relatively unresponsive, EMS called.  Found to be agonal breathing.  Unclear initial pulse ox was.  Eventually lost pulse.  CPR was initiated.  1 dose of epi.  About 6 months CPR.  ROSC obtained.  King airway placed.  Transported to the ED.  King airway exchanged for ET tube.  Noted to be hypotensive so norepinephrine started peripherally.  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  CTA PE protocol monitor patient reveals no PE, significant bilateral airspace disease was dense consolidations in the lower lobes, significant emphysema in upper lobes with interstitial thickening felt to be most consistent with pneumonitis/infection versus volume overload.  Moderate pericardial effusion noted by the radiologist.  Other findings as below.  He was given broad-spectrum antibiotics.  Initial lactate over 10.  1 L crystalloid given.  At time of evaluation he is on fentanyl drip and propofol.  Past Medical History  Tobacco Use Emphysema CVA DM  Significant Hospital Events   8/2: PEA Arrest, King airway in the field, CPR x6 minutes and epi x1, ROSC, Intubated in ED, Admitted to PCCM  8/4: Weaned off TTM, [place] O2 sat of 47%, started on milrinone and lasix 8/5 milrinone increased 8/6 TF stopped, OG placed to suction for ileus 8/8 Post pyloric Cortrak placed, myoclonic jerking, started Keppra, depakote 8/10 Completed antibiotic course for pneumonia. Family meeting transitioned to DNR.   Procedures:  [facility]: 8/3 d/ced on 8/12 Cortrak: 8/8  Significant Diagnostic  Tests:   CT HEAD WO CONTRAST (5MM)   Result Date: 04/29/2021 CLINICAL DATA:  Mental status change EXAM: CT HEAD WITHOUT CONTRAST TECHNIQUE: Contiguous axial images were obtained from the base of the skull through the vertex without intravenous contrast. COMPARISON:  None. FINDINGS: Brain: No acute territorial infarction, hemorrhage or intracranial mass is visualized. Chronic right MCA infarct with extensive encephalomalacia involving the right frontal, parietal and temporal lobes as well as the right thalamus and basal ganglia. Moderate atrophy. Ex vacuo dilatation of right lateral ventricle. Atrophy of right brainstem. Vascular: No hyperdense vessels.  Carotid vascular calcification Skull: Normal. Negative for fracture or focal lesion. Sinuses/Orbits: Mucosal thickening in the sinuses. Chronic appearing deformity of the medial wall right orbit. Other: Incomplete fusion posterior arch of C1 IMPRESSION: 1. No definite CT evidence for acute intracranial abnormality. 2. Atrophy and chronic right MCA infarct. Electronically Signed   By: Vincent PangKim  Vincent [name] M.D.   On: 04/29/2021 22:09   CT Angio Chest PE W and/or Wo Contrast   Result Date: 04/29/2021 CLINICAL DATA:  PE suspected, high prob Post CPR. EXAM: CT ANGIOGRAPHY CHEST WITH CONTRAST TECHNIQUE: Multidetector CT imaging of the chest was performed using the standard protocol during bolus administration of intravenous contrast. Multiplanar CT image reconstructions and MIPs were obtained to evaluate the vascular anatomy. CONTRAST:  50mL OMNIPAQUE IOHEXOL 350 MG/ML SOLN COMPARISON:  Chest radiograph earlier today. FINDINGS: Cardiovascular: There are no filling defects within the pulmonary arteries to suggest pulmonary embolus. Mild aortic atherosclerosis. Cannot assess for dissection given phase of contrast tailored to pulmonary  arteries S1. Multi chamber cardiomegaly. Minimal contrast refluxes into the hepatic veins and IVC. Moderate size circumferential pericardial  effusion. This measures up to 17 mm in depth adjacent to the right ventricle. Mediastinum/Nodes: Shotty mediastinal adenopathy, including right anterior paratracheal node measuring 10 mm, series 5, image 37. Bilateral hilar lymph nodes measuring 9-10 mm. The esophagus is decompressed by enteric tube. No visualized thyroid nodule. Lungs/Pleura: The endotracheal tube tip is at the level of the carina, recommend retraction of 2-3 cm. Dense lower lobe consolidation, left greater than right, suspicious for aspiration. There additional patchy, ground-glass and confluent airspace opacities throughout both lungs. Mild smooth septal thickening. Underlying emphysema which is partially obscured by superimposed airspace disease. There is a 2.1 x 2.6 cm nodular density posteriorly in the left upper lobe abutting the pleura, series 6, image 26, partially obscured by adjacent airspace disease. Small bilateral pleural effusions, as well as fluid tracking into the right minor fissure. No pneumothorax. Upper Abdomen: No adrenal nodule. No acute upper abdominal findings. Probable scarring in the upper left kidney. Motion obscures evaluation of the upper abdomen. Musculoskeletal: No acute osseous abnormality. No anterior rib fractures typically seen with CPR. No focal bone lesion. Review of the MIP images confirms the above findings. IMPRESSION: 1. No pulmonary embolus. 2. Multi chamber cardiomegaly with moderate circumferential pericardial effusion. Minimal contrast refluxes into the hepatic veins and IVC consistent with elevated right heart pressures. 3. Dense lower lobe consolidation, left greater than right, suspicious for aspiration. Small bilateral pleural effusions. 4. There is set the thickening in ground-glass opacities suspicious for pulmonary edema. Superimposed airspace disease within there is a ground-glass opacity may represent confluent edema or infection. 5. Shotty mediastinal and hilar adenopathy is likely reactive,  but nonspecific. 6. Endotracheal tube tip is at the level of the carina, recommend retraction of 2-3 cm. 7. A 2.1 x 2.6 cm nodular density posteriorly in the left upper lobe abutting the pleura is nonspecific given the adjacent parenchymal findings, however recommend attention at follow-up to exclude the possibility of pulmonary mass. Aortic Atherosclerosis (ICD10-I70.0) and Emphysema (ICD10-J43.9). Electronically Signed   By: Narda Rutherford M.D.   On: 04/29/2021 22:02   DG Chest Portable 1 View   Result Date: 04/29/2021 CLINICAL DATA:  Post CPR.  Cardiac arrest EXAM: PORTABLE CHEST 1 VIEW COMPARISON:  None FINDINGS: Endotracheal tube terminates 2.2 cm above carina. External pacer/defibrillator is. Midline trachea. Mild cardiomegaly. No pleural effusion or pneumothorax. Interstitial and airspace disease is relatively diffuse but greater on the left than right. IMPRESSION: Appropriate position of endotracheal tube. Cardiomegaly with left greater than right interstitial and airspace disease. Favor asymmetric pulmonary edema. Given asymmetry, aspiration is possible but felt less likely. Electronically Signed   By: Jeronimo Greaves M.D.   On: 04/29/2021 20:53   EEG adult   Result Date: 04/30/2021 Rejeana Brock, MD     04/30/2021  2:15 AM History: 50 year old male status post cardiac arrest Sedation: Propofol Technique: This is a 21 channel routine scalp EEG performed at the bedside with bipolar and monopolar montages arranged in accordance to the international 10/20 system of electrode placement. One channel was dedicated to EKG recording. Background: The background is diffusely attenuated with ventilator artifact.  There is some degree of muscle artifact throughout most of the recording, but no definite background activity is seen.  He has several episodes of "shaking" without definite EEG change, other than significant muscle artifact.  With one of these episodes muscle artifact does significantly obscure  the  background, but to a degree I can tell there was no significant EEG change. Photic stimulation: Physiologic driving is not performed EEG Abnormalities: Diffusely attenuated background Clinical Interpretation: This EEG is severely abnormal with diffuse attenuation of the background.  There was no evidence of the muscle jerking seen was epileptiform in nature.  There was no seizure or seizure predisposition recorded on this study. Please note that lack of epileptiform activity on EEG does not preclude the possibility of epilepsy. Ritta SlotMcNeill Kirkpatrick, MD [facility] [phone] If 7pm- 7am, please page neurology on call as listed in AMION.   Overnight EEG with video   Result Date: 04/30/2021 Charlsie QuestYadav, Priyanka O, MD     04/30/2021  9:03 AM Patient Name: Vincent Vincent [name] MRN: [id number] Epilepsy Attending: Charlsie QuestPriyanka O Yadav Referring Physician/Provider: Rutherford Guysahul Jolly Carlini, PA Duration: 04/30/2021 0202 to 04/30/2021 0900 Patient history: 50 year old male status post cardiac arrest. EEG to evaluate for seizure Level of alertness:  comatose AEDs during EEG study: LEV, propofol Technical aspects: This EEG study was done with scalp electrodes positioned according to the 10-20 International system of electrode placement. Electrical activity was acquired at a sampling rate of 500Hz  and reviewed with a high frequency filter of 70Hz  and a low frequency filter of 1Hz . EEG data were recorded continuously and digitally stored. Description: EEG showed continuous generalized background suppression.  EEG was not reactive to tactile stimulation.  Event button was pressed on 04/30/2021 at 0752 for tremors in arms and chest. Concomitant EEG before, during and after the event did not show any EEG changes suggest seizure. Hyperventilation and photic stimulation were not performed.   ABNORMALITY -Background suppression, generalized IMPRESSION: This study is suggestive of profound diffuse encephalopathy, nonspecific to etiology.   However with a history of cardiac arrest this could be secondary to anoxic/hypoxic brain injury, sedation.  No seizures or epileptiform discharges were seen throughout the recording. Event button was pressed on 04/30/2021 at 0752 for tremors in arms and chest without concomitant EEG change. This was most likely not an epileptic event. Charlsie QuestPriyanka O Yadav        [facility]ARD[facility]GRAM [facility]PORT [facility]   [name]am: 04/30/2021  Medical Rec #:  [id number]               Height:       68.0 in  Accession #:    [id number]              Weight:       154.5 lb  Date of Birth:  11/16/1970                BSA:          1.832 m  Patient Age:    50 years                BP:           69/54 mmHg  Patient Gender: M                       HR:           110 bpm.  Exam Location:  Inpatient   Procedure: 2D Echo, Cardiac Doppler and Color Doppler   Indications:    Cardiac Arrest I46.9     History:        Patient has no prior history of Echocardiogram  examinations.     Sonographer:    Elmarie Shileyiffany Dance  Referring Phys: [id number] RAHUL P Zamir Staples  Sonographer Comments: Echo performed with patient supine and on artificial  respirator. Reading Cardiologist notified.  IMPRESSIONS     1. Minor contractile sparing of the lateral wall. Left ventricular  ejection fraction, by estimation, is <20%. The left ventricle has severely  decreased function. The left ventricle demonstrates global hypokinesis.  Left ventricular diastolic parameters  are consistent with Grade III diastolic dysfunction (restrictive).   2. Right ventricular systolic function is normal. The right ventricular  size is normal. There is mildly elevated pulmonary artery systolic  pressure. The estimated right ventricular systolic pressure is 40.8 mmHg.   3. A small pericardial effusion is present. The pericardial effusion is  circumferential. There is no evidence of cardiac tamponade.   4. The mitral valve is normal in structure. Trivial  mitral valve  regurgitation. No evidence of mitral stenosis.   5. The aortic valve is normal in structure. Aortic valve regurgitation is  not visualized. No aortic stenosis is present.   6. The inferior vena cava is dilated in size with <50% respiratory  variability, suggesting right atrial pressure of 15 mmHg.   FINDINGS   Left Ventricle: Minor contractile sparing of the lateral wall. Left  ventricular ejection fraction, by estimation, is <20%. The left ventricle  has severely decreased function. The left ventricle demonstrates global  hypokinesis. The left ventricular  internal cavity size was normal in size. There is no left ventricular  hypertrophy. Left ventricular diastolic parameters are consistent with  Grade III diastolic dysfunction (restrictive).   Right Ventricle: The right ventricular size is normal. No increase in  right ventricular wall thickness. Right ventricular systolic function is  normal. There is mildly elevated pulmonary artery systolic pressure. The  tricuspid regurgitant velocity is 2.54   m/s, and with an assumed right atrial pressure of 15 mmHg, the estimated  right ventricular systolic pressure is 40.8 mmHg.   Left Atrium: Left atrial size was normal in size.   Right Atrium: Right atrial size was normal in size.   Pericardium: A small pericardial effusion is present. The pericardial  effusion is circumferential. There is no evidence of cardiac tamponade.   Mitral Valve: The mitral valve is normal in structure. Trivial mitral  valve regurgitation. No evidence of mitral valve stenosis.   Tricuspid Valve: The tricuspid valve is normal in structure. Tricuspid  valve regurgitation is not demonstrated. No evidence of tricuspid  stenosis.   Aortic Valve: The aortic valve is normal in structure. Aortic valve  regurgitation is not visualized. No aortic stenosis is present.   Pulmonic Valve: The pulmonic valve was normal in structure. Pulmonic valve   regurgitation is trivial. No evidence of pulmonic stenosis.   Aorta: The aortic root is normal in size and structure.   Venous: The inferior vena cava is dilated in size with less than 50%  respiratory variability, suggesting right atrial pressure of 15 mmHg.   IAS/Shunts: No atrial level shunt detected by color flow Doppler.     Diastology  LV e' medial:   2.08 cm/s  LV E/e' medial: 28.9    RIGHT VENTRICLE            IVC  RV Basal diam:  3.30 cm    IVC diam: 2.30 cm  RV Mid diam:    2.70 cm  RV S prime:     5.06 cm/s  TAPSE (M-mode): 0.9 cm   LEFT ATRIUM             Index  RIGHT ATRIUM           Index  LA Vol (A2C):   50.8 ml 27.74 ml/m RA Area:     12.50 cm  LA Vol (A4C):   37.5 ml 20.47 ml/m RA Volume:   31.20 ml  17.03 ml/m  LA Biplane Vol: 44.6 ml 24.35 ml/m   AORTIC VALVE  LVOT Vmax:   30.30 cm/s  LVOT Vmean:  22.250 cm/s  LVOT VTI:    0.037 m     AORTA  Ao Asc diam: 2.70 cm   MITRAL VALVE               TRICUSPID VALVE  MV Area (PHT): 3.66 cm    TR Peak grad:   25.8 mmHg  MV Decel Time: 207 msec    TR Vmax:        254.00 cm/s  MV E velocity: 60.10 cm/s  MV A velocity: 41.70 cm/s  SHUNTS  MV E/A ratio:  1.44        Systemic VTI: 0.04 m No results found.   Micro Data:  MRSA swab: Negative Resp Panel: COVID, FLU negative  Antimicrobials:  Cefepime: 8/2>8/2 Vanc: 8/2>8/2 Azithromax: 8/2>>8/4 Ceftriaxone: 8/3>>>8/8 Augmentin: 8/8>>8/10  Interim history/subjective:  Had some prn klonopin overnight.  Objective   Blood pressure (!) 130/94, pulse (!) 110, temperature 99.2 F (37.3 C), temperature source Oral, resp. rate (!) 26, height 5\' 8"  (1.727 m), weight 65.5 kg, SpO2 97 %.    Vent Mode: PSV;CPAP FiO2 (%):  [40 %] 40 % Set Rate:  [14 bmp] 14 bmp Vt Set:  [550 mL] 550 mL PEEP:  [5 cmH20] 5 cmH20 Pressure Support:  [12 cmH20] 12 cmH20 Plateau Pressure:  [16 cmH20-21 cmH20] 16 cmH20   Intake/Output Summary (Last 24 hours) at 05/11/2021  1001 Last data filed at 05/11/2021 0900 Gross per 24 hour  Intake 2975 ml  Output 1445 ml  Net 1530 ml   Filed Weights   05/08/21 0600 05/10/21 0344 05/11/21 0438  Weight: 66.2 kg 66.1 kg 65.5 kg   Chronically ill ETT to vent Minimally responsive Breathing nonlabored with clear lung Abdomen soft, nontender   Labs reviewed CBG 114   Resolved Hospital Problem list   Bowel obstruction/ileus  Assessment & Plan:   Cardiogenic Shock PEA Cardiac Arrest Heart Failure with Reduced Ejection Fraction:  Repeat Limited Echo showing EF of 20-25%. Off milrinone.  continue metoprolol 12.5 mg today - Goal of K>4.0 and magnesium >2.0. Replete as necessary   Acute Hypoxemic Respiratory Failure Requiring MV:  HAP:  - Continue mechanical ventilatory support - Continue daily SBT and SAT as tolerated. Continue PS trials as tolerated.  - VAP protocol  - completed abx.  - plan for trach next week.   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 Family discussion 8/10 significant for continued care, but CODE STATUS was changed to DNR. Will need tracheostomy and PEG tube early next week.  - Appreciate Neurology's assistance  - Continue Keppra IV or PO - continue prn klonopin for myoclonus - No other sedating medications at this time   Acute Kidney Injury:  In setting of cardiogenic shock and PEA arrest. Kidney function continues to improve.  - Trend BMP. Unclear baseline but suspect CKD - repeat labs ordered for monday    Diabetes mellitus Type II: -  Continue SSI   Lung Mass: 2.1x2.6cm nodular density posteriorly in the left upper lobe - Will need outpatient follow up if survives this hospitalization  Anemia - Monitor with CBC, likely acute on chronic illness - transfuse if Hgb<7  Best practice:  Diet/type:  tubefeeds. Will need peg next week.  DVT prophylaxis: prophylactic heparin  GI prophylaxis: PPI Lines: none.  Foley: Yes, and it is still needed Code Status: DNR Last date of multidisciplinary goals of care discussion 8/10 discussed with patient's sister and with Dr. Wilford Corner in neurology. Patient made DNR but to continue all aggressive care.  Family Communication:will update today Disposition: continues to require ICU.   Labs   CBC: Recent Labs  Lab 05/06/21 0505 05/07/21 0614 05/08/21 0317 05/09/21 0705 05/10/21 0145  WBC 10.9* 9.0 10.9* 13.2* 12.8*  HGB 11.7* 11.6* 13.5 13.0 12.3*  HCT 35.3* 35.9* 42.5 40.6 38.0*  MCV 86.5 87.6 89.5 89.6 88.6  PLT 471* 430* 493* 487* 482*    Basic Metabolic Panel: Recent Labs  Lab 05/05/21 0304 05/05/21 1031 05/06/21 0505 05/06/21 0511 05/07/21 0317 05/07/21 1725 05/08/21 0317 05/08/21 0908 05/09/21 0437 05/09/21 0705 05/10/21 0145  NA 142  --  146*  --  145  --  145  --  143  --  140  K 3.4*   < > 3.5  --  3.0* 4.1 3.8  --  3.8  --  5.0  CL 102  --  103  --  107  --  111  --  110  --  107  CO2 28  --  27  --  25  --  27  --  25  --  25  GLUCOSE 127*  --  92  --  103*  --  114*  --  115*  --  116*  BUN 60*  --  55*  --  44*  --  37*  --  36*  --  34*  CREATININE 2.21*  --  2.51*  --  2.22*  --  1.82*  --  1.63*  --  1.58*  CALCIUM 9.0  --  9.1  --  8.4*  --  8.7*  --  8.9  --  8.9  MG 2.5*  --   --  2.4 2.7*  --   --  2.5*  --  2.6*  --   PHOS 4.5  --   --   --  3.7  --   --   --   --   --   --    < > = values in this interval not displayed.   GFR: Estimated Creatinine Clearance: 51.2 mL/min (A) (by C-G formula based on SCr of 1.58 mg/dL (H)). Recent Labs  Lab 05/07/21 0614 05/08/21 0317 05/09/21 0705 05/10/21 0145  WBC 9.0 10.9* 13.2* 12.8*    Liver Function Tests: No results for input(s): AST, ALT, ALKPHOS, BILITOT, PROT, ALBUMIN in the last 168 hours.  No results for input(s): LIPASE, AMYLASE in the last 168  hours. No results for input(s): AMMONIA in the last 168 hours.  ABG    Component Value Date/Time   PHART 7.494 (H) 05/02/2021 1822   PCO2ART 30.6 (L) 05/02/2021 1822   PO2ART 95 05/02/2021 1822   HCO3 23.5 05/02/2021 1822   TCO2 24 05/02/2021 1822   ACIDBASEDEF 9.0 (H) 04/30/2021 0815   O2SAT 70.2 05/08/2021 0317      Coagulation Profile: No results for input(s): INR, PROTIME in the last 168  hours.   Cardiac Enzymes: No results for input(s): CKTOTAL, CKMB, CKMBINDEX, TROPONINI in the last 168 hours.  HbA1C: Hgb A1c MFr Bld  Date/Time Value Ref Range Status  04/30/2021 12:22 AM 5.8 (H) 4.8 - 5.6 % Final    Comment:    (NOTE) Pre diabetes:          5.7%-6.4%  Diabetes:              >6.4%  Glycemic control for   <7.0% adults with diabetes     CBG: Recent Labs  Lab 05/10/21 1536 05/10/21 1927 05/10/21 2321 05/11/21 0326 05/11/21 0722  GLUCAP 104* 114* 119* 123* 114*   The patient is critically ill due to respiratory failure, encephalopathy, cardiac arrest.  Critical care was necessary to treat or prevent imminent or life-threatening deterioration.  Critical care was time spent personally by me on the following activities: development of treatment plan with patient and/or surrogate as well as nursing, discussions with consultants, evaluation of patient's response to treatment, examination of patient, obtaining history from patient or surrogate, ordering and performing treatments and interventions, ordering and review of laboratory studies, ordering and review of radiographic studies, pulse oximetry, re-evaluation of patient's condition and participation in multidisciplinary rounds.   Critical Care Time devoted to patient care services described in this note is 33 minutes. This time reflects time of care of this signee Charlott Holler . This critical care time does not reflect separately billable procedures or procedure time, teaching time or supervisory time of PA/NP/Med  student/Med Resident etc but could involve care discussion time.       Charlott Holler [facility] 05/11/2021 10:01 AM  Pager: see AMION  If no response to pager , please call critical care on call (see AMION) until 7pm After 7:00 pm call Elink

## 2021-05-12 ENCOUNTER — Inpatient Hospital Stay (HOSPITAL_COMMUNITY): Payer: Medicaid Other

## 2021-05-12 DIAGNOSIS — I469 Cardiac arrest, cause unspecified: Secondary | ICD-10-CM | POA: Diagnosis not present

## 2021-05-12 DIAGNOSIS — N179 Acute kidney failure, unspecified: Secondary | ICD-10-CM | POA: Diagnosis not present

## 2021-05-12 DIAGNOSIS — J9601 Acute respiratory failure with hypoxia: Secondary | ICD-10-CM | POA: Diagnosis not present

## 2021-05-12 LAB — GLUCOSE, CAPILLARY
Glucose-Capillary: 113 mg/dL — ABNORMAL HIGH (ref 70–99)
Glucose-Capillary: 117 mg/dL — ABNORMAL HIGH (ref 70–99)
Glucose-Capillary: 123 mg/dL — ABNORMAL HIGH (ref 70–99)
Glucose-Capillary: 130 mg/dL — ABNORMAL HIGH (ref 70–99)
Glucose-Capillary: 135 mg/dL — ABNORMAL HIGH (ref 70–99)
Glucose-Capillary: 96 mg/dL (ref 70–99)

## 2021-05-12 LAB — CBC
HCT: 35.1 % — ABNORMAL LOW (ref 39.0–52.0)
Hemoglobin: 11.3 g/dL — ABNORMAL LOW (ref 13.0–17.0)
MCH: 28.3 pg (ref 26.0–34.0)
MCHC: 32.2 g/dL (ref 30.0–36.0)
MCV: 88 fL (ref 80.0–100.0)
Platelets: 464 10*3/uL — ABNORMAL HIGH (ref 150–400)
RBC: 3.99 MIL/uL — ABNORMAL LOW (ref 4.22–5.81)
RDW: 14.2 % (ref 11.5–15.5)
WBC: 12.2 10*3/uL — ABNORMAL HIGH (ref 4.0–10.5)
nRBC: 0 % (ref 0.0–0.2)

## 2021-05-12 LAB — BASIC METABOLIC PANEL
Anion gap: 8 (ref 5–15)
BUN: 33 mg/dL — ABNORMAL HIGH (ref 6–20)
CO2: 25 mmol/L (ref 22–32)
Calcium: 8.9 mg/dL (ref 8.9–10.3)
Chloride: 104 mmol/L (ref 98–111)
Creatinine, Ser: 1.23 mg/dL (ref 0.61–1.24)
GFR, Estimated: >60 mL/min (ref >60)
Glucose, Bld: 118 mg/dL — ABNORMAL HIGH (ref 70–99)
Potassium: 4.4 mmol/L (ref 3.5–5.1)
Sodium: 137 mmol/L (ref 135–145)

## 2021-05-12 MED ORDER — FREE WATER
100.0000 mL | Freq: Three times a day (TID) | Status: DC
  Administered 2021-05-12 – 2021-05-23 (×32): 100 mL

## 2021-05-12 MED ORDER — VECURONIUM BROMIDE 10 MG IV SOLR
10.0000 mg | Freq: Once | INTRAVENOUS | Status: AC
  Administered 2021-05-12: 10 mg via INTRAVENOUS
  Filled 2021-05-12: qty 10

## 2021-05-12 MED ORDER — MIDAZOLAM HCL 2 MG/2ML IJ SOLN
4.0000 mg | Freq: Once | INTRAMUSCULAR | Status: AC
  Administered 2021-05-12: 4 mg via INTRAVENOUS
  Filled 2021-05-12: qty 4

## 2021-05-12 MED ORDER — FENTANYL CITRATE (PF) 100 MCG/2ML IJ SOLN
100.0000 ug | Freq: Once | INTRAMUSCULAR | Status: AC
  Administered 2021-05-12: 100 ug via INTRAVENOUS
  Filled 2021-05-12: qty 2

## 2021-05-12 MED ORDER — ETOMIDATE 2 MG/ML IV SOLN
20.0000 mg | Freq: Once | INTRAVENOUS | Status: AC
  Administered 2021-05-12: 20 mg via INTRAVENOUS
  Filled 2021-05-12: qty 10

## 2021-05-12 NOTE — Progress Notes (Addendum)
NAME:  Vincent [name], MRN:  [id number], DOB:  1970-10-09, LOS: 13 ADMISSION DATE:  04/29/2021, CONSULTATION DATE:  04/29/21 REFERRING MD:  Dr. Jacqulyn Bath, CHIEF COMPLAINT:  Found Down   History of present illness   50 y/o M, admitted to ICU after PEA arrest, likely respiratory driven.  ED note reviewed.  History unobtainable for patient as he is intubated and sedated.  History per chart and sister at bedside.  Patient was not acting right, relatively unresponsive, EMS called.  Found to be agonal breathing.  Unclear initial pulse ox was.  Eventually lost pulse.  CPR was initiated. 1 dose of epi.  About 6 minutes of CPR.  ROSC obtained.  King airway placed. Transported to the ED.  King airway exchanged for ET tube.  Noted to be hypotensive so norepinephrine started peripherally.  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  CTA PE protocol monitor patient reveals no PE, significant bilateral airspace disease was dense consolidations in the lower lobes, significant emphysema in upper lobes with interstitial thickening felt to be most consistent with pneumonitis/infection versus volume overload.  Moderate pericardial effusion noted by the radiologist.  Other findings as below.  He was given broad-spectrum antibiotics.  Initial lactate over 10.  1 L crystalloid given.  At time of evaluation he is on fentanyl drip and propofol.  Past Medical History  Tobacco Use Emphysema CVA DM  Significant Hospital Events   8/2: PEA Arrest, King airway in the field, CPR x6 minutes and epi x1, ROSC, Intubated in ED, Admitted to PCCM  8/4: Weaned off TTM, [place] O2 sat of 47%, started on milrinone and lasix 8/5 milrinone increased 8/6 TF stopped, OG placed to suction for ileus 8/8 Post pyloric Cortrak placed, myoclonic jerking, started Keppra, depakote 8/10 Completed antibiotic course for pneumonia. Family meeting transitioned to DNR. 8/15 Tracheostomy   Procedures:  [facility]: 8/3 d/ced on 8/12 Cortrak:  8/8  Significant Diagnostic Tests:   CT HEAD WO CONTRAST ( )   Result Date: 04/29/2021 CLINICAL DATA:  Mental status change EXAM: CT HEAD WITHOUT CONTRAST TECHNIQUE: Contiguous axial images were obtained from the base of the skull through the vertex without intravenous contrast. COMPARISON:  None. FINDINGS: Brain: No acute territorial infarction, hemorrhage or intracranial mass is visualized. Chronic right MCA infarct with extensive encephalomalacia involving the right frontal, parietal and temporal lobes as well as the right thalamus and basal ganglia. Moderate atrophy. Ex vacuo dilatation of right lateral ventricle. Atrophy of right brainstem. Vascular: No hyperdense vessels.  Carotid vascular calcification Skull: Normal. Negative for fracture or focal lesion. Sinuses/Orbits: Mucosal thickening in the sinuses. Chronic appearing deformity of the medial wall right orbit. Other: Incomplete fusion posterior arch of C1 IMPRESSION: 1. No definite CT evidence for acute intracranial abnormality. 2. Atrophy and chronic right MCA infarct. Electronically Signed   By: Jasmine Pang M.D.   On: 04/29/2021 22:09   CT Angio Chest PE W and/or Wo Contrast   Result Date: 04/29/2021 CLINICAL DATA:  PE suspected, high prob Post CPR. EXAM: CT ANGIOGRAPHY CHEST WITH CONTRAST TECHNIQUE: Multidetector CT imaging of the chest was performed using the standard protocol during bolus administration of intravenous contrast. Multiplanar CT image reconstructions and MIPs were obtained to evaluate the vascular anatomy. CONTRAST:  24mL OMNIPAQUE IOHEXOL 350 MG/ML SOLN COMPARISON:  Chest radiograph earlier today. FINDINGS: Cardiovascular: There are no filling defects within the pulmonary arteries to suggest pulmonary embolus. Mild aortic atherosclerosis. Cannot assess for dissection given phase of contrast tailored to  pulmonary arteries S1. Multi chamber cardiomegaly. Minimal contrast refluxes into the hepatic veins and IVC. Moderate size  circumferential pericardial effusion. This measures up to 17 mm in depth adjacent to the right ventricle. Mediastinum/Nodes: Shotty mediastinal adenopathy, including right anterior paratracheal node measuring 10 mm, series 5, image 37. Bilateral hilar lymph nodes measuring 9-10 mm. The esophagus is decompressed by enteric tube. No visualized thyroid nodule. Lungs/Pleura: The endotracheal tube tip is at the level of the carina, recommend retraction of 2-3 cm. Dense lower lobe consolidation, left greater than right, suspicious for aspiration. There additional patchy, ground-glass and confluent airspace opacities throughout both lungs. Mild smooth septal thickening. Underlying emphysema which is partially obscured by superimposed airspace disease. There is a 2.1 x 2.6 cm nodular density posteriorly in the left upper lobe abutting the pleura, series 6, image 26, partially obscured by adjacent airspace disease. Small bilateral pleural effusions, as well as fluid tracking into the right minor fissure. No pneumothorax. Upper Abdomen: No adrenal nodule. No acute upper abdominal findings. Probable scarring in the upper left kidney. Motion obscures evaluation of the upper abdomen. Musculoskeletal: No acute osseous abnormality. No anterior rib fractures typically seen with CPR. No focal bone lesion. Review of the MIP images confirms the above findings. IMPRESSION: 1. No pulmonary embolus. 2. Multi chamber cardiomegaly with moderate circumferential pericardial effusion. Minimal contrast refluxes into the hepatic veins and IVC consistent with elevated right heart pressures. 3. Dense lower lobe consolidation, left greater than right, suspicious for aspiration. Small bilateral pleural effusions. 4. There is set the thickening in ground-glass opacities suspicious for pulmonary edema. Superimposed airspace disease within there is a ground-glass opacity may represent confluent edema or infection. 5. Shotty mediastinal and hilar  adenopathy is likely reactive, but nonspecific. 6. Endotracheal tube tip is at the level of the carina, recommend retraction of 2-3 cm. 7. A 2.1 x 2.6 cm nodular density posteriorly in the left upper lobe abutting the pleura is nonspecific given the adjacent parenchymal findings, however recommend attention at follow-up to exclude the possibility of pulmonary mass. Aortic Atherosclerosis (ICD10-I70.0) and Emphysema (ICD10-J43.9). Electronically Signed   By: Narda Rutherford M.D.   On: 04/29/2021 22:02   DG Chest Portable 1 View   Result Date: 04/29/2021 CLINICAL DATA:  Post CPR.  Cardiac arrest EXAM: PORTABLE CHEST 1 VIEW COMPARISON:  None FINDINGS: Endotracheal tube terminates 2.2 cm above carina. External pacer/defibrillator is. Midline trachea. Mild cardiomegaly. No pleural effusion or pneumothorax. Interstitial and airspace disease is relatively diffuse but greater on the left than right. IMPRESSION: Appropriate position of endotracheal tube. Cardiomegaly with left greater than right interstitial and airspace disease. Favor asymmetric pulmonary edema. Given asymmetry, aspiration is possible but felt less likely. Electronically Signed   By: Jeronimo Greaves M.D.   On: 04/29/2021 20:53   EEG adult   Result Date: 04/30/2021 Rejeana Brock, MD     04/30/2021  2:15 AM History: 50 year old male status post cardiac arrest Sedation: Propofol Technique: This is a 21 channel routine scalp EEG performed at the bedside with bipolar and monopolar montages arranged in accordance to the international 10/20 system of electrode placement. One channel was dedicated to EKG recording. Background: The background is diffusely attenuated with ventilator artifact.  There is some degree of muscle artifact throughout most of the recording, but no definite background activity is seen.  He has several episodes of "shaking" without definite EEG change, other than significant muscle artifact.  With one of these episodes muscle  artifact does significantly obscure  the background, but to a degree I can tell there was no significant EEG change. Photic stimulation: Physiologic driving is not performed EEG Abnormalities: Diffusely attenuated background Clinical Interpretation: This EEG is severely abnormal with diffuse attenuation of the background.  There was no evidence of the muscle jerking seen was epileptiform in nature.  There was no seizure or seizure predisposition recorded on this study. Please note that lack of epileptiform activity on EEG does not preclude the possibility of epilepsy. Ritta Slot, MD [facility] [phone] If 7pm- 7am, please page neurology on call as listed in AMION.   Overnight EEG with video   Result Date: 04/30/2021 Charlsie Quest, MD     04/30/2021  9:03 AM Patient Name: Roald Lukacs MRN: [id number] Epilepsy Attending: Charlsie Quest Referring Physician/Provider: Rutherford Guys, PA Duration: 04/30/2021 0202 to 04/30/2021 0900 Patient history: 50 year old male status post cardiac arrest. EEG to evaluate for seizure Level of alertness:  comatose AEDs during EEG study: LEV, propofol Technical aspects: This EEG study was done with scalp electrodes positioned according to the 10-20 International system of electrode placement. Electrical activity was acquired at a sampling rate of 500Hz  and reviewed with a high frequency filter of 70Hz  and a low frequency filter of 1Hz . EEG data were recorded continuously and digitally stored. Description: EEG showed continuous generalized background suppression.  EEG was not reactive to tactile stimulation.  Event button was pressed on 04/30/2021 at 0752 for tremors in arms and chest. Concomitant EEG before, during and after the event did not show any EEG changes suggest seizure. Hyperventilation and photic stimulation were not performed.   ABNORMALITY -Background suppression, generalized IMPRESSION: This study is suggestive of profound diffuse  encephalopathy, nonspecific to etiology.  However with a history of cardiac arrest this could be secondary to anoxic/hypoxic brain injury, sedation.  No seizures or epileptiform discharges were seen throughout the recording. Event button was pressed on 04/30/2021 at 0752 for tremors in arms and chest without concomitant EEG change. This was most likely not an epileptic event.        ECHOCARDIOGRAM REPORT Patient Name:   [facility] Date of Exam: 04/30/2021  Medical Rec #:  [facility]               Height:       68.0 in  Accession #:    [facility]              Weight:       154.5 lb  Date of Birth:  November 17, 1970                BSA:          1.832 m  Patient Age:    50 years                BP:           69/54 mmHg  Patient Gender: M                       HR:           110 bpm.  Exam Location:  Inpatient   Procedure: 2D Echo, Cardiac Doppler and Color Doppler   Indications:    Cardiac Arrest I46.9     History:        Patient has no prior history of Echocardiogram  examinations.     Sonographer:    Tiffany Dance  Referring Phys: [id number] RAHUL P  DESAI      Sonographer Comments: Echo performed with patient supine and on artificial  respirator. Reading Cardiologist notified.  IMPRESSIONS     1. Minor contractile sparing of the lateral wall. Left ventricular  ejection fraction, by estimation, is <20%. The left ventricle has severely  decreased function. The left ventricle demonstrates global hypokinesis.  Left ventricular diastolic parameters  are consistent with Grade III diastolic dysfunction (restrictive).   2. Right ventricular systolic function is normal. The right ventricular  size is normal. There is mildly elevated pulmonary artery systolic  pressure. The estimated right ventricular systolic pressure is 40.8 mmHg.   3. A small pericardial effusion is present. The pericardial effusion is  circumferential. There is no evidence of cardiac tamponade.   4. The  mitral valve is normal in structure. Trivial mitral valve  regurgitation. No evidence of mitral stenosis.   5. The aortic valve is normal in structure. Aortic valve regurgitation is  not visualized. No aortic stenosis is present.   6. The inferior vena cava is dilated in size with <50% respiratory  variability, suggesting right atrial pressure of 15 mmHg.   FINDINGS   Left Ventricle: Minor contractile sparing of the lateral wall. Left  ventricular ejection fraction, by estimation, is <20%. The left ventricle  has severely decreased function. The left ventricle demonstrates global  hypokinesis. The left ventricular  internal cavity size was normal in size. There is no left ventricular  hypertrophy. Left ventricular diastolic parameters are consistent with  Grade III diastolic dysfunction (restrictive).   Right Ventricle: The right ventricular size is normal. No increase in  right ventricular wall thickness. Right ventricular systolic function is  normal. There is mildly elevated pulmonary artery systolic pressure. The  tricuspid regurgitant velocity is 2.54   m/s, and with an assumed right atrial pressure of 15 mmHg, the estimated  right ventricular systolic pressure is 40.8 mmHg.   Left Atrium: Left atrial size was normal in size.   Right Atrium: Right atrial size was normal in size.   Pericardium: A small pericardial effusion is present. The pericardial  effusion is circumferential. There is no evidence of cardiac tamponade.   Mitral Valve: The mitral valve is normal in structure. Trivial mitral  valve regurgitation. No evidence of mitral valve stenosis.   Tricuspid Valve: The tricuspid valve is normal in structure. Tricuspid  valve regurgitation is not demonstrated. No evidence of tricuspid  stenosis.   Aortic Valve: The aortic valve is normal in structure. Aortic valve  regurgitation is not visualized. No aortic stenosis is present.   Pulmonic Valve: The pulmonic valve was  normal in structure. Pulmonic valve  regurgitation is trivial. No evidence of pulmonic stenosis.   Aorta: The aortic root is normal in size and structure.   Venous: The inferior vena cava is dilated in size with less than 50%  respiratory variability, suggesting right atrial pressure of 15 mmHg.   IAS/Shunts: No atrial level shunt detected by color flow Doppler.     Diastology  LV e' medial:   2.08 cm/s  LV E/e' medial: 28.9    RIGHT VENTRICLE            IVC  RV Basal diam:  3.30 cm    IVC diam: 2.30 cm  RV Mid diam:    2.70 cm  RV S prime:     5.06 cm/s  TAPSE (M-mode): 0.9 cm   LEFT ATRIUM  Index       RIGHT ATRIUM           Index  LA Vol (A2C):   50.8 ml 27.74 ml/m RA Area:     12.50 cm  LA Vol (A4C):   37.5 ml 20.47 ml/m RA Volume:   31.20 ml  17.03 ml/m  LA Biplane Vol: 44.6 ml 24.35 ml/m   AORTIC VALVE  LVOT Vmax:   30.30 cm/s  LVOT Vmean:  22.250 cm/s  LVOT VTI:    0.037 m     AORTA  Ao Asc diam: 2.70 cm   MITRAL VALVE               TRICUSPID VALVE  MV Area (PHT): 3.66 cm    TR Peak grad:   25.8 mmHg  MV Decel Time: 207 msec    TR Vmax:        254.00 cm/s  MV E velocity: 60.10 cm/s  MV A velocity: 41.70 cm/s  SHUNTS  MV E/A ratio:  1.44        Systemic VTI: 0.04 m No results found.   Micro Data:  MRSA swab: Negative Resp Panel: COVID, FLU negative  Antimicrobials:  Cefepime: 8/2>8/2 Vanc: 8/2>8/2 Azithromax: 8/2>>8/4 Ceftriaxone: 8/3>>>8/8 Augmentin: 8/8>>8/10  Interim history/subjective:  Had some prn klonopin overnight.  Objective   Blood pressure 111/86, pulse 89, temperature 99.4 F (37.4 C), temperature source Axillary, resp. rate 14, height 5\' 8"  (1.727 m), weight 67.2 kg, SpO2 100 %.    Vent Mode: PRVC FiO2 (%):  [40 %] 40 % Set Rate:  [14 bmp] 14 bmp Vt Set:  [550 mL] 550 mL PEEP:  [5 cmH20] 5 cmH20 Pressure Support:  [12 cmH20] 12 cmH20 Plateau Pressure:  [17 cmH20-20 cmH20] 17 cmH20   Intake/Output Summary (Last 24  hours) at 05/12/2021 0708 Last data filed at 05/12/2021 0700 Gross per 24 hour  Intake 3085 ml  Output 1455 ml  Net 1630 ml   Filed Weights   05/10/21 0344 05/11/21 0438 05/12/21 0244  Weight: 66.1 kg 65.5 kg 67.2 kg   Physical Exam Constitutional:      General: He is not in acute distress.    Appearance: He is not toxic-appearing.     Comments: Ill appearing, intubated, non communicative   Pulmonary:     Breath sounds: Normal breath sounds. No wheezing, rhonchi or rales.     Comments: NAD on ventilator, tolerating PS. CTAB Abdominal:     General: There is distension.     Tenderness: There is no abdominal tenderness.     Comments: Distended, active bowel sounds, no tenderness to palpation.   Musculoskeletal:     Right lower leg: No edema.     Left lower leg: No edema.  Skin:    General: Skin is warm.     Findings: No erythema or rash.     Resolved Hospital Problem list   Bowel obstruction/ileus Acute Kidney Injury  Assessment & Plan:   Cardiogenic Shock PEA Cardiac Arrest Heart Failure with Reduced Ejection Fraction:  Repeat Limited Echo showing EF of 20-25%. Off milrinone.  - Continue Metoprolol 12.5 mg BID - Goal of K>4.0 and magnesium >2.0. Replete as necessary   Acute Hypoxemic Respiratory Failure Requiring MV:  HAP: - Continue mechanical ventilatory support - Continue daily SBT and SAT as tolerated. Continue PS trials as tolerated.  - VAP protocol  - completed abx.  - plan for trach next week.   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 Family discussion 8/10 significant for continued care, but CODE STATUS was changed to DNR. Spoke with sister this 8/15 for trach placement and went over complications and indications, she would like to proceed with trach. Mr. Lesiak bedside nurse was  witness, procedure document signed. Trach tentatively scheduled for 1500.  - Ordered trach tray, versed, vecuronium, etomidate, fentanyl to bedside. - Appreciate Neurology's assistance  - Continue Keppra IV or PO - Continue prn klonopin for myoclonus  Diabetes Mellitus Type II: - Continue SSI   Lung Mass: 2.1x2.6cm nodular density posteriorly in the left upper lobe - Will need outpatient follow up if survives this hospitalization  Anemia Likely acute on chronic illness - Monitor with CBC,  - Transfuse if Hgb<7  Best practice:  Diet/type: tubefeeds. Will need peg next week.  DVT prophylaxis: prophylactic heparin  GI prophylaxis: PPI Lines: none.  Foley: Condom Cath  Code Status: DNR Last date of multidisciplinary goals of care discussion 8/10 discussed with patient's sister and with Dr. Wilford Corner in neurology. Patient made DNR but to continue all aggressive care.  Family Communication:will update today Disposition: continues to require ICU.   Labs   CBC: Recent Labs  Lab 05/07/21 0614 05/08/21 0317 05/09/21 0705 05/10/21 0145 05/12/21 0111  WBC 9.0 10.9* 13.2* 12.8* 12.2*  HGB 11.6* 13.5 13.0 12.3* 11.3*  HCT 35.9* 42.5 40.6 38.0* 35.1*  MCV 87.6 89.5 89.6 88.6 88.0  PLT 430* 493* 487* 482* 464*    Basic Metabolic Panel: Recent Labs  Lab 05/06/21 0511 05/07/21 0317 05/07/21 1725 05/08/21 0317 05/08/21 0908 05/09/21 0437 05/09/21 0705 05/10/21 0145 05/12/21 0111  NA  --  145  --  145  --  143  --  140 137  K  --  3.0* 4.1 3.8  --  3.8  --  5.0 4.4  CL  --  107  --  111  --  110  --  107 104  CO2  --  25  --  27  --  25  --  25 25  GLUCOSE  --  103*  --  114*  --  115*  --  116* 118*  BUN  --  44*  --  37*  --  36*  --  34* 33*  CREATININE  --  2.22*  --  1.82*  --  1.63*  --  1.58* 1.23  CALCIUM  --  8.4*  --  8.7*  --  8.9  --  8.9 8.9  MG 2.4 2.7*  --   --  2.5*  --  2.6*  --   --   PHOS  --  3.7  --   --   --   --   --   --   --    GFR: Estimated  Creatinine Clearance: 67.5 mL/min (by C-G formula based on SCr of 1.23 mg/dL). Recent Labs  Lab 05/08/21 0317 05/09/21 0705 05/10/21 0145 05/12/21 0111  WBC 10.9* 13.2* 12.8* 12.2*    Liver Function Tests: No results for input(s): AST, ALT, ALKPHOS, BILITOT, PROT, ALBUMIN in the last 168 hours.  No results for input(s): LIPASE, AMYLASE in the last 168 hours. No results for input(s): AMMONIA in the last 168 hours.  ABG    Component Value Date/Time   PHART 7.494 (H) 05/02/2021 1822  PCO2ART 30.6 (L) 05/02/2021 1822   PO2ART 95 05/02/2021 1822   HCO3 23.5 05/02/2021 1822   TCO2 24 05/02/2021 1822   ACIDBASEDEF 9.0 (H) 04/30/2021 0815   O2SAT 70.2 05/08/2021 0317   Coagulation Profile: No results for input(s): INR, PROTIME in the last 168 hours.   Cardiac Enzymes: No results for input(s): CKTOTAL, CKMB, CKMBINDEX, TROPONINI in the last 168 hours.  HbA1C: Hgb A1c MFr Bld  Date/Time Value Ref Range Status  04/30/2021 12:22 AM 5.8 (H) 4.8 - 5.6 % Final    Comment:    (NOTE) Pre diabetes:          5.7%-6.4%  Diabetes:              >6.4%  Glycemic control for   <7.0% adults with diabetes     CBG: Recent Labs  Lab 05/11/21 1119 05/11/21 1516 05/11/21 1936 05/11/21 2306 05/12/21 0320  GLUCAP 112* 107* 109* 116* 130*   Dolan Amen, MD [facility], PGY-3 Pager: [phone] 05/12/2021,7:08 AM   If no response to pager , please call critical care on call (see AMION) until 7pm After 7:00 pm call Elink

## 2021-05-12 NOTE — Procedures (Signed)
Percutaneous Tracheostomy Procedure Note   Vincent [name]  [id number]  [birth year]  Date:05/12/21  Time:3:45 PM   Provider Performing:Demika Langenderfer C Katrinka Blazing and Cheri Fowler  Procedure: Percutaneous Tracheostomy with Bronchoscopic Guidance (84536)  Indication(s) Persistent respiratory failure  Consent Risks of the procedure as well as the alternatives and risks of each were explained to the patient and/or caregiver.  Consent for the procedure was obtained.  Anesthesia Etomidate, Versed, Fentanyl, Vecuronium   Time Out Verified patient identification, verified procedure, site/side was marked, verified correct patient position, special equipment/implants available, medications/allergies/relevant history reviewed, required imaging and test results available.   Sterile Technique Maximal sterile technique including sterile barrier drape, hand hygiene, sterile gown, sterile gloves, mask, hair covering.    Procedure Description Appropriate anatomy identified by palpation.  Patient's neck prepped and draped in sterile fashion.  1% lidocaine with epinephrine was used to anesthetize skin overlying neck.  1.5cm incision made and blunt dissection performed until tracheal rings could be easily palpated.   Then a size 6-0 Shiley tracheostomy was placed under bronchoscopic visualization using usual Seldinger technique and serial dilation.   Bronchoscope confirmed placement above the carina.  Tracheostomy was sutured in place with adhesive pad to protect skin under pressure.    Patient connected to ventilator.   Complications/Tolerance None; patient tolerated the procedure well. Chest X-ray is ordered to confirm no post-procedural complication.   EBL Minimal   Specimen(s) None

## 2021-05-12 NOTE — Procedures (Signed)
Diagnostic Bronchoscopy  Vincent [name]  [id number]  02/22/1971  Date:05/12/21  Time:3:29 PM   Provider Performing:Laiana Fratus Sande Brothers   Procedure: Diagnostic Bronchoscopy (38177)  Indication(s) Assist with direct visualization of tracheostomy placement  Consent Risks of the procedure as well as the alternatives and risks of each were explained to the patient and/or caregiver.  Consent for the procedure was obtained.   Anesthesia See separate tracheostomy note   Time Out Verified patient identification, verified procedure, site/side was marked, verified correct patient position, special equipment/implants available, medications/allergies/relevant history reviewed, required imaging and test results available.   Sterile Technique Usual hand hygiene, masks, gowns, and gloves were used   Procedure Description Bronchoscope advanced through endotracheal tube and into airway.  After suctioning out tracheal secretions, bronchoscope used to provide direct visualization of tracheostomy placement.   Complications/Tolerance None; patient tolerated the procedure well.   EBL None  Specimen(s) None

## 2021-05-13 ENCOUNTER — Inpatient Hospital Stay (HOSPITAL_COMMUNITY): Payer: Medicaid Other

## 2021-05-13 DIAGNOSIS — J9601 Acute respiratory failure with hypoxia: Secondary | ICD-10-CM | POA: Diagnosis not present

## 2021-05-13 DIAGNOSIS — I469 Cardiac arrest, cause unspecified: Secondary | ICD-10-CM | POA: Diagnosis not present

## 2021-05-13 DIAGNOSIS — N179 Acute kidney failure, unspecified: Secondary | ICD-10-CM | POA: Diagnosis not present

## 2021-05-13 LAB — BASIC METABOLIC PANEL
Anion gap: 10 (ref 5–15)
BUN: 34 mg/dL — ABNORMAL HIGH (ref 6–20)
CO2: 26 mmol/L (ref 22–32)
Calcium: 9.1 mg/dL (ref 8.9–10.3)
Chloride: 104 mmol/L (ref 98–111)
Creatinine, Ser: 1.35 mg/dL — ABNORMAL HIGH (ref 0.61–1.24)
GFR, Estimated: >60 mL/min (ref >60)
Glucose, Bld: 127 mg/dL — ABNORMAL HIGH (ref 70–99)
Potassium: 4.4 mmol/L (ref 3.5–5.1)
Sodium: 140 mmol/L (ref 135–145)

## 2021-05-13 LAB — GLUCOSE, CAPILLARY
Glucose-Capillary: 115 mg/dL — ABNORMAL HIGH (ref 70–99)
Glucose-Capillary: 150 mg/dL — ABNORMAL HIGH (ref 70–99)
Glucose-Capillary: 116 mg/dL — ABNORMAL HIGH (ref 70–99)
Glucose-Capillary: 125 mg/dL — ABNORMAL HIGH (ref 70–99)
Glucose-Capillary: 135 mg/dL — ABNORMAL HIGH (ref 70–99)

## 2021-05-13 LAB — CBC
HCT: 33.7 % — ABNORMAL LOW (ref 39.0–52.0)
Hemoglobin: 10.9 g/dL — ABNORMAL LOW (ref 13.0–17.0)
MCH: 28.8 pg (ref 26.0–34.0)
MCHC: 32.3 g/dL (ref 30.0–36.0)
MCV: 89.2 fL (ref 80.0–100.0)
Platelets: 473 10*3/uL — ABNORMAL HIGH (ref 150–400)
RBC: 3.78 MIL/uL — ABNORMAL LOW (ref 4.22–5.81)
RDW: 14.5 % (ref 11.5–15.5)
WBC: 12.3 10*3/uL — ABNORMAL HIGH (ref 4.0–10.5)
nRBC: 0 % (ref 0.0–0.2)

## 2021-05-13 LAB — MAGNESIUM: Magnesium: 2 mg/dL (ref 1.7–2.4)

## 2021-05-13 MED ORDER — METOPROLOL TARTRATE 25 MG/10 ML ORAL SUSPENSION
25.0000 mg | Freq: Three times a day (TID) | ORAL | Status: DC
  Administered 2021-05-13 – 2021-05-14 (×3): 25 mg
  Filled 2021-05-13 (×3): qty 10

## 2021-05-13 NOTE — Progress Notes (Signed)
NAME:  Vincent [name], MRN:  [id number], DOB:  1971/04/08, LOS: 14 ADMISSION DATE:  04/29/2021, CONSULTATION DATE:  04/29/21 REFERRING MD:  Dr. Jacqulyn Bath, CHIEF COMPLAINT:  Found Down   History of present illness   50 y/o M, admitted to ICU after PEA arrest, likely respiratory driven.  ED note reviewed.  History unobtainable for patient as he is intubated and sedated.  History per chart and sister at bedside.  Patient was not acting right, relatively unresponsive, EMS called.  Found to be agonal breathing.  Unclear initial pulse ox was.  Eventually lost pulse.  CPR was initiated. 1 dose of epi.  About 6 minutes of CPR.  ROSC obtained.  King airway placed. Transported to the ED.  King airway exchanged for ET tube.  Noted to be hypotensive so norepinephrine started peripherally.  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  CTA PE protocol monitor patient reveals no PE, significant bilateral airspace disease was dense consolidations in the lower lobes, significant emphysema in upper lobes with interstitial thickening felt to be most consistent with pneumonitis/infection versus volume overload.  Moderate pericardial effusion noted by the radiologist.  Other findings as below.  He was given broad-spectrum antibiotics.  Initial lactate over 10.  1 L crystalloid given.  At time of evaluation he is on fentanyl drip and propofol.  Past Medical History  Tobacco Use Emphysema CVA DM  Significant Hospital Events   8/2: PEA Arrest, King airway in the field, CPR x6 minutes and epi x1, ROSC, Intubated in ED, Admitted to PCCM  8/4: Weaned off TTM, [place] O2 sat of 47%, started on milrinone and lasix 8/5 milrinone increased 8/6 TF stopped, OG placed to suction for ileus 8/8 Post pyloric Cortrak placed, myoclonic jerking, started Keppra, depakote 8/10 Completed antibiotic course for pneumonia. Family meeting transitioned to DNR. 8/15 Tracheostomy   Procedures:  [facility]: 8/3 d/ced on 8/12 Cortrak:  8/8  Significant Diagnostic Tests:   CT HEAD WO CONTRAST ( )   Result Date: 04/29/2021 CLINICAL DATA:  Mental status change EXAM: CT HEAD WITHOUT CONTRAST TECHNIQUE: Contiguous axial images were obtained from the base of the skull through the vertex without intravenous contrast. COMPARISON:  None. FINDINGS: Brain: No acute territorial infarction, hemorrhage or intracranial mass is visualized. Chronic right MCA infarct with extensive encephalomalacia involving the right frontal, parietal and temporal lobes as well as the right thalamus and basal ganglia. Moderate atrophy. Ex vacuo dilatation of right lateral ventricle. Atrophy of right brainstem. Vascular: No hyperdense vessels.  Carotid vascular calcification Skull: Normal. Negative for fracture or focal lesion. Sinuses/Orbits: Mucosal thickening in the sinuses. Chronic appearing deformity of the medial wall right orbit. Other: Incomplete fusion posterior arch of C1 IMPRESSION: 1. No definite CT evidence for acute intracranial abnormality. 2. Atrophy and chronic right MCA infarct. Electronically Signed   By: Jasmine Pang M.D.   On: 04/29/2021 22:09   CT Angio Chest PE W and/or Wo Contrast   Result Date: 04/29/2021 CLINICAL DATA:  PE suspected, high prob Post CPR. EXAM: CT ANGIOGRAPHY CHEST WITH CONTRAST TECHNIQUE: Multidetector CT imaging of the chest was performed using the standard protocol during bolus administration of intravenous contrast. Multiplanar CT image reconstructions and MIPs were obtained to evaluate the vascular anatomy. CONTRAST:  50mL OMNIPAQUE IOHEXOL 350 MG/ML SOLN COMPARISON:  Chest radiograph earlier today. FINDINGS: Cardiovascular: There are no filling defects within the pulmonary arteries to suggest pulmonary embolus. Mild aortic atherosclerosis. Cannot assess for dissection given phase of contrast tailored to  pulmonary arteries S1. Multi chamber cardiomegaly. Minimal contrast refluxes into the hepatic veins and IVC. Moderate size  circumferential pericardial effusion. This measures up to 17 mm in depth adjacent to the right ventricle. Mediastinum/Nodes: Shotty mediastinal adenopathy, including right anterior paratracheal node measuring 10 mm, series 5, image 37. Bilateral hilar lymph nodes measuring 9-10 mm. The esophagus is decompressed by enteric tube. No visualized thyroid nodule. Lungs/Pleura: The endotracheal tube tip is at the level of the carina, recommend retraction of 2-3 cm. Dense lower lobe consolidation, left greater than right, suspicious for aspiration. There additional patchy, ground-glass and confluent airspace opacities throughout both lungs. Mild smooth septal thickening. Underlying emphysema which is partially obscured by superimposed airspace disease. There is a 2.1 x 2.6 cm nodular density posteriorly in the left upper lobe abutting the pleura, series 6, image 26, partially obscured by adjacent airspace disease. Small bilateral pleural effusions, as well as fluid tracking into the right minor fissure. No pneumothorax. Upper Abdomen: No adrenal nodule. No acute upper abdominal findings. Probable scarring in the upper left kidney. Motion obscures evaluation of the upper abdomen. Musculoskeletal: No acute osseous abnormality. No anterior rib fractures typically seen with CPR. No focal bone lesion. Review of the MIP images confirms the above findings. IMPRESSION: 1. No pulmonary embolus. 2. Multi chamber cardiomegaly with moderate circumferential pericardial effusion. Minimal contrast refluxes into the hepatic veins and IVC consistent with elevated right heart pressures. 3. Dense lower lobe consolidation, left greater than right, suspicious for aspiration. Small bilateral pleural effusions. 4. There is set the thickening in ground-glass opacities suspicious for pulmonary edema. Superimposed airspace disease within there is a ground-glass opacity may represent confluent edema or infection. 5. Shotty mediastinal and hilar  adenopathy is likely reactive, but nonspecific. 6. Endotracheal tube tip is at the level of the carina, recommend retraction of 2-3 cm. 7. A 2.1 x 2.6 cm nodular density posteriorly in the left upper lobe abutting the pleura is nonspecific given the adjacent parenchymal findings, however recommend attention at follow-up to exclude the possibility of pulmonary mass. Aortic Atherosclerosis (ICD10-I70.0) and Emphysema (ICD10-J43.9). Electronically Signed   By: Narda Rutherford M.D.   On: 04/29/2021 22:02   DG Chest Portable 1 View   Result Date: 04/29/2021 CLINICAL DATA:  Post CPR.  Cardiac arrest EXAM: PORTABLE CHEST 1 VIEW COMPARISON:  None FINDINGS: Endotracheal tube terminates 2.2 cm above carina. External pacer/defibrillator is. Midline trachea. Mild cardiomegaly. No pleural effusion or pneumothorax. Interstitial and airspace disease is relatively diffuse but greater on the left than right. IMPRESSION: Appropriate position of endotracheal tube. Cardiomegaly with left greater than right interstitial and airspace disease. Favor asymmetric pulmonary edema. Given asymmetry, aspiration is possible but felt less likely. Electronically Signed   By: Jeronimo Greaves M.D.   On: 04/29/2021 20:53   EEG adult   Result Date: 04/30/2021 Rejeana Brock, MD     04/30/2021  2:15 AM History: 50 year old male status post cardiac arrest Sedation: Propofol Technique: This is a 21 channel routine scalp EEG performed at the bedside with bipolar and monopolar montages arranged in accordance to the international 10/20 system of electrode placement. One channel was dedicated to EKG recording. Background: The background is diffusely attenuated with ventilator artifact.  There is some degree of muscle artifact throughout most of the recording, but no definite background activity is seen.  He has several episodes of "shaking" without definite EEG change, other than significant muscle artifact.  With one of these episodes muscle  artifact does significantly obscure  the background, but to a degree I can tell there was no significant EEG change. Photic stimulation: Physiologic driving is not performed EEG Abnormalities: Diffusely attenuated background Clinical Interpretation: This EEG is severely abnormal with diffuse attenuation of the background.  There was no evidence of the muscle jerking seen was epileptiform in nature.  There was no seizure or seizure predisposition recorded on this study. Please note that lack of epileptiform activity on EEG does not preclude the possibility of epilepsy. Ritta Slot, MD [facility] [phone] If 7pm- 7am, please page neurology on call as listed in AMION.   Overnight EEG with video   Result Date: 04/30/2021 Charlsie Quest, MD     04/30/2021  9:03 AM Patient Name: Vincent [name] MRN: [id number] Epilepsy Attending: Charlsie Quest Referring Physician/Provider: Rutherford Guys, PA Duration: 04/30/2021 0202 to 04/30/2021 0900 Patient history: 50 year old male status post cardiac arrest. EEG to evaluate for seizure Level of alertness:  comatose AEDs during EEG study: LEV, propofol Technical aspects: This EEG study was done with scalp electrodes positioned according to the 10-20 International system of electrode placement. Electrical activity was acquired at a sampling rate of 500Hz  and reviewed with a high frequency filter of 70Hz  and a low frequency filter of 1Hz . EEG data were recorded continuously and digitally stored. Description: EEG showed continuous generalized background suppression.  EEG was not reactive to tactile stimulation.  Event button was pressed on 04/30/2021 at 0752 for tremors in arms and chest. Concomitant EEG before, during and after the event did not show any EEG changes suggest seizure. Hyperventilation and photic stimulation were not performed.   ABNORMALITY -Background suppression, generalized IMPRESSION: This study is suggestive of profound diffuse  encephalopathy, nonspecific to etiology.  However with a history of cardiac arrest this could be secondary to anoxic/hypoxic brain injury, sedation.  No seizures or epileptiform discharges were seen throughout the recording. Event button was pressed on 04/30/2021 at 0752 for tremors in arms and chest without concomitant EEG change. This was most likely not an epileptic event.        ECHOCARDIOGRAM REPORT Patient Name:   [facility] Date of Exam: 04/30/2021  Medical Rec #:  [facility]               Height:       68.0 in  Accession #:    [facility]              Weight:       154.5 lb  Date of Birth:  November 17, 1970                BSA:          1.832 m  Patient Age:    50 years                BP:           69/54 mmHg  Patient Gender: M                       HR:           110 bpm.  Exam Location:  Inpatient   Procedure: 2D Echo, Cardiac Doppler and Color Doppler   Indications:    Cardiac Arrest I46.9     History:        Patient has no prior history of Echocardiogram  examinations.     Sonographer:    Tiffany Dance  Referring Phys: [id number] RAHUL P  DESAI      Sonographer Comments: Echo performed with patient supine and on artificial  respirator. Reading Cardiologist notified.  IMPRESSIONS     1. Minor contractile sparing of the lateral wall. Left ventricular  ejection fraction, by estimation, is <20%. The left ventricle has severely  decreased function. The left ventricle demonstrates global hypokinesis.  Left ventricular diastolic parameters  are consistent with Grade III diastolic dysfunction (restrictive).   2. Right ventricular systolic function is normal. The right ventricular  size is normal. There is mildly elevated pulmonary artery systolic  pressure. The estimated right ventricular systolic pressure is 40.8 mmHg.   3. A small pericardial effusion is present. The pericardial effusion is  circumferential. There is no evidence of cardiac tamponade.   4. The  mitral valve is normal in structure. Trivial mitral valve  regurgitation. No evidence of mitral stenosis.   5. The aortic valve is normal in structure. Aortic valve regurgitation is  not visualized. No aortic stenosis is present.   6. The inferior vena cava is dilated in size with <50% respiratory  variability, suggesting right atrial pressure of 15 mmHg.   FINDINGS   Left Ventricle: Minor contractile sparing of the lateral wall. Left  ventricular ejection fraction, by estimation, is <20%. The left ventricle  has severely decreased function. The left ventricle demonstrates global  hypokinesis. The left ventricular  internal cavity size was normal in size. There is no left ventricular  hypertrophy. Left ventricular diastolic parameters are consistent with  Grade III diastolic dysfunction (restrictive).   Right Ventricle: The right ventricular size is normal. No increase in  right ventricular wall thickness. Right ventricular systolic function is  normal. There is mildly elevated pulmonary artery systolic pressure. The  tricuspid regurgitant velocity is 2.54   m/s, and with an assumed right atrial pressure of 15 mmHg, the estimated  right ventricular systolic pressure is 40.8 mmHg.   Left Atrium: Left atrial size was normal in size.   Right Atrium: Right atrial size was normal in size.   Pericardium: A small pericardial effusion is present. The pericardial  effusion is circumferential. There is no evidence of cardiac tamponade.   Mitral Valve: The mitral valve is normal in structure. Trivial mitral  valve regurgitation. No evidence of mitral valve stenosis.   Tricuspid Valve: The tricuspid valve is normal in structure. Tricuspid  valve regurgitation is not demonstrated. No evidence of tricuspid  stenosis.   Aortic Valve: The aortic valve is normal in structure. Aortic valve  regurgitation is not visualized. No aortic stenosis is present.   Pulmonic Valve: The pulmonic valve was  normal in structure. Pulmonic valve  regurgitation is trivial. No evidence of pulmonic stenosis.   Aorta: The aortic root is normal in size and structure.   Venous: The inferior vena cava is dilated in size with less than 50%  respiratory variability, suggesting right atrial pressure of 15 mmHg.   IAS/Shunts: No atrial level shunt detected by color flow Doppler.     Diastology  LV e' medial:   2.08 cm/s  LV E/e' medial: 28.9    RIGHT VENTRICLE            IVC  RV Basal diam:  3.30 cm    IVC diam: 2.30 cm  RV Mid diam:    2.70 cm  RV S prime:     5.06 cm/s  TAPSE (M-mode): 0.9 cm   LEFT ATRIUM  Index       RIGHT ATRIUM           Index  LA Vol (A2C):   50.8 ml 27.74 ml/m RA Area:     12.50 cm  LA Vol (A4C):   37.5 ml 20.47 ml/m RA Volume:   31.20 ml  17.03 ml/m  LA Biplane Vol: 44.6 ml 24.35 ml/m   AORTIC VALVE  LVOT Vmax:   30.30 cm/s  LVOT Vmean:  22.250 cm/s  LVOT VTI:    0.037 m     AORTA  Ao Asc diam: 2.70 cm   MITRAL VALVE               TRICUSPID VALVE  MV Area (PHT): 3.66 cm    TR Peak grad:   25.8 mmHg  MV Decel Time: 207 msec    TR Vmax:        254.00 cm/s  MV E velocity: 60.10 cm/s  MV A velocity: 41.70 cm/s  SHUNTS  MV E/A ratio:  1.44        Systemic VTI: 0.04 m DG CHEST PORT 1 VIEW  Result Date: 05/12/2021 CLINICAL DATA:  Status post tracheostomy EXAM: PORTABLE CHEST 1 VIEW COMPARISON:  05/06/2021 FINDINGS: Tracheostomy tube is noted in satisfactory position. Feeding catheter extends into the stomach. Cardiac shadow is enlarged. No focal infiltrate or effusion is seen. IMPRESSION: Tracheostomy tube in satisfactory position. No acute abnormality noted. Electronically Signed   By: Alcide Clever M.D.   On: 05/12/2021 17:41     Micro Data:  MRSA swab: Negative Resp Panel: COVID, FLU negative  Antimicrobials:  Cefepime: 8/2>8/2 Vanc: 8/2>8/2 Azithromax: 8/2>>8/4 Ceftriaxone: 8/3>>>8/8 Augmentin: 8/8>>8/10  Interim history/subjective:  O/N: No  events  Non-communicative this AM.  Objective   Blood pressure 118/85, pulse (!) 107, temperature (!) 97.5 F (36.4 C), temperature source Axillary, resp. rate 16, height 5\' 8"  (1.727 m), weight 68.3 kg, SpO2 100 %.    Vent Mode: PRVC FiO2 (%):  [40 %] 40 % Set Rate:  [14 bmp] 14 bmp Vt Set:  [550 mL] 550 mL PEEP:  [5 cmH20] 5 cmH20 Pressure Support:  [12 cmH20] 12 cmH20 Plateau Pressure:  [18 cmH20-25 cmH20] 18 cmH20   Intake/Output Summary (Last 24 hours) at 05/13/2021 0713 Last data filed at 05/13/2021 0600 Gross per 24 hour  Intake 1830 ml  Output 1129 ml  Net 701 ml   Filed Weights   05/11/21 0438 05/12/21 0244 05/13/21 0500  Weight: 65.5 kg 67.2 kg 68.3 kg    Physical Exam Constitutional:      General: He is not in acute distress.    Appearance: He is ill-appearing and diaphoretic.     Comments: Ill appearing, NAD, non-communicative   Neck:     Comments: Trach intact, no signs of erythema, purulence or drainage at site.  Cardiovascular:     Rate and Rhythm: Regular rhythm. Tachycardia present.     Pulses: Normal pulses.     Heart sounds: Normal heart sounds. No murmur heard.   No friction rub. No gallop.  Pulmonary:     Breath sounds: No wheezing or rales.  Abdominal:     General: There is distension.     Tenderness: There is no abdominal tenderness.     Comments: Hypoactive BS  Skin:    General: Skin is warm.  Neurological:     Comments: Does not follow commands, spontaneously moving head with no purposeful movement, tracks with eyes.  Resolved Hospital Problem list   Bowel Obstruction/ileus Acute Kidney Injury  Assessment & Plan:   Cardiogenic Shock PEA Cardiac Arrest Heart Failure with Reduced Ejection Fraction:  EF of 20-25%. Off milrinone.  - Increased Lopressor to 25 mg TID  - Goal of K>4.0 and magnesium >2.0. Replete as necessary - Trend BMP and magnesium    Acute Hypoxemic Respiratory Failure Requiring MV:  HAP: S/P Trach placement on  8/15. Will need PEG later this week. Was tachycardic this AM in the 120s-130s, mucus plug was suctioned that was pink/yellow in character, but has been tachycardic since his tracheostomy, likely a component of pain. At bedside secretions are clear/white. Additionally spiked fever of 101.42F this AM at 0740 and received tylenol. Will observe fever curve, if continues to spike may need bronchoscopy and respiratory cultures with broad spectrum antibiotics.  - Continue mechanical ventilatory support - Continue daily SBT and SAT as tolerated. Continue PS trials as tolerated.  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 Family discussion 8/10 significant for continued care, but CODE STATUS was changed to DNR. S/P Trach 8/15. Will reach out to IR for PEG tube placement today in order to optimize  - Appreciate Neurology's assistance  - Continue Keppra IV or PO - Continue PRN Klonopin for myoclonus  Diabetes Mellitus Type II: - Continue SSI   Lung Mass: 2.1x2.6cm nodular density posteriorly in the left upper lobe - Will need outpatient follow up if survives this hospitalization  Anemia Likely acute on chronic illness - Monitor with CBC  - Transfuse if Hgb<7  Best practice:  Diet/type: tubefeeds. Will need peg later this week.  DVT prophylaxis: prophylactic heparin  GI prophylaxis: PPI Lines: none.  Foley: Condom Cath  Code Status: DNR Last date of multidisciplinary goals of care discussion 8/10 discussed with patient's sister and with Dr. Wilford CornerArora in neurology. Patient made DNR but to continue all aggressive care.  Family Communication:will update today Disposition: continues to require ICU.   Labs   CBC: Recent Labs  Lab 05/07/21 0614 05/08/21 0317 05/09/21 0705 05/10/21 0145 05/12/21 0111  WBC 9.0  10.9* 13.2* 12.8* 12.2*  HGB 11.6* 13.5 13.0 12.3* 11.3*  HCT 35.9* 42.5 40.6 38.0* 35.1*  MCV 87.6 89.5 89.6 88.6 88.0  PLT 430* 493* 487* 482* 464*    Basic Metabolic Panel: Recent Labs  Lab 05/07/21 0317 05/07/21 1725 05/08/21 0317 05/08/21 0908 05/09/21 0437 05/09/21 0705 05/10/21 0145 05/12/21 0111  NA 145  --  145  --  143  --  140 137  K 3.0* 4.1 3.8  --  3.8  --  5.0 4.4  CL 107  --  111  --  110  --  107 104  CO2 25  --  27  --  25  --  25 25  GLUCOSE 103*  --  114*  --  115*  --  116* 118*  BUN 44*  --  37*  --  36*  --  34* 33*  CREATININE 2.22*  --  1.82*  --  1.63*  --  1.58* 1.23  CALCIUM 8.4*  --  8.7*  --  8.9  --  8.9 8.9  MG 2.7*  --   --  2.5*  --  2.6*  --   --   PHOS 3.7  --   --   --   --   --   --   --  GFR: Estimated Creatinine Clearance: 68.6 mL/min (by C-G formula based on SCr of 1.23 mg/dL). Recent Labs  Lab 05/08/21 0317 05/09/21 0705 05/10/21 0145 05/12/21 0111  WBC 10.9* 13.2* 12.8* 12.2*    Liver Function Tests: No results for input(s): AST, ALT, ALKPHOS, BILITOT, PROT, ALBUMIN in the last 168 hours.  No results for input(s): LIPASE, AMYLASE in the last 168 hours. No results for input(s): AMMONIA in the last 168 hours.  ABG    Component Value Date/Time   PHART 7.494 (H) 05/02/2021 1822   PCO2ART 30.6 (L) 05/02/2021 1822   PO2ART 95 05/02/2021 1822   HCO3 23.5 05/02/2021 1822   TCO2 24 05/02/2021 1822   ACIDBASEDEF 9.0 (H) 04/30/2021 0815   O2SAT 70.2 05/08/2021 0317   Coagulation Profile: No results for input(s): INR, PROTIME in the last 168 hours.   Cardiac Enzymes: No results for input(s): CKTOTAL, CKMB, CKMBINDEX, TROPONINI in the last 168 hours.  HbA1C: Hgb A1c MFr Bld  Date/Time Value Ref Range Status  04/30/2021 12:22 AM 5.8 (H) 4.8 - 5.6 % Final    Comment:    (NOTE) Pre diabetes:          5.7%-6.4%  Diabetes:              >6.4%  Glycemic control for   <7.0% adults with diabetes     CBG: Recent  Labs  Lab 05/12/21 1145 05/12/21 1550 05/12/21 2002 05/12/21 2337 05/13/21 0302  GLUCAP 96 113* 135* 123* 115*   Dolan Amen, MD [facility], PGY-3 Pager: [phone] 05/13/2021,7:13 AM   If no response to pager , please call critical care on call (see AMION) until 7pm After 7:00 pm call Elink

## 2021-05-13 NOTE — Progress Notes (Signed)
SLP Cancellation Note  Patient Details Name: Vincent [name] MRN: [id number] DOB: Feb 15, 1971   Cancelled treatment:       Reason Eval/Treat Not Completed: Patient not medically ready Patient with new tracheostomy. Orders for SLP eval and treat for PMSV and swallowing received. Will follow pt closely for readiness for SLP interventions as appropriate.     Gabrial Domine, Riley Nearing 05/13/2021, 7:53 AM

## 2021-05-14 ENCOUNTER — Inpatient Hospital Stay (HOSPITAL_COMMUNITY): Payer: Medicaid Other

## 2021-05-14 DIAGNOSIS — J9601 Acute respiratory failure with hypoxia: Secondary | ICD-10-CM | POA: Diagnosis not present

## 2021-05-14 DIAGNOSIS — N179 Acute kidney failure, unspecified: Secondary | ICD-10-CM | POA: Diagnosis not present

## 2021-05-14 DIAGNOSIS — I469 Cardiac arrest, cause unspecified: Secondary | ICD-10-CM | POA: Diagnosis not present

## 2021-05-14 LAB — GLUCOSE, CAPILLARY
Glucose-Capillary: 113 mg/dL — ABNORMAL HIGH (ref 70–99)
Glucose-Capillary: 115 mg/dL — ABNORMAL HIGH (ref 70–99)
Glucose-Capillary: 124 mg/dL — ABNORMAL HIGH (ref 70–99)
Glucose-Capillary: 130 mg/dL — ABNORMAL HIGH (ref 70–99)
Glucose-Capillary: 133 mg/dL — ABNORMAL HIGH (ref 70–99)
Glucose-Capillary: 135 mg/dL — ABNORMAL HIGH (ref 70–99)
Glucose-Capillary: 145 mg/dL — ABNORMAL HIGH (ref 70–99)

## 2021-05-14 LAB — BASIC METABOLIC PANEL
Anion gap: 11 (ref 5–15)
BUN: 34 mg/dL — ABNORMAL HIGH (ref 6–20)
CO2: 23 mmol/L (ref 22–32)
Calcium: 9.4 mg/dL (ref 8.9–10.3)
Chloride: 107 mmol/L (ref 98–111)
Creatinine, Ser: 1.17 mg/dL (ref 0.61–1.24)
GFR, Estimated: >60 mL/min (ref >60)
Glucose, Bld: 128 mg/dL — ABNORMAL HIGH (ref 70–99)
Potassium: 4.6 mmol/L (ref 3.5–5.1)
Sodium: 141 mmol/L (ref 135–145)

## 2021-05-14 LAB — CBC
HCT: 33.1 % — ABNORMAL LOW (ref 39.0–52.0)
Hemoglobin: 10.5 g/dL — ABNORMAL LOW (ref 13.0–17.0)
MCH: 28.3 pg (ref 26.0–34.0)
MCHC: 31.7 g/dL (ref 30.0–36.0)
MCV: 89.2 fL (ref 80.0–100.0)
Platelets: 453 10*3/uL — ABNORMAL HIGH (ref 150–400)
RBC: 3.71 MIL/uL — ABNORMAL LOW (ref 4.22–5.81)
RDW: 14.6 % (ref 11.5–15.5)
WBC: 11.8 10*3/uL — ABNORMAL HIGH (ref 4.0–10.5)
nRBC: 0 % (ref 0.0–0.2)

## 2021-05-14 MED ORDER — METOPROLOL TARTRATE 25 MG/10 ML ORAL SUSPENSION
50.0000 mg | Freq: Three times a day (TID) | ORAL | Status: DC
  Administered 2021-05-14 – 2021-06-08 (×73): 50 mg
  Filled 2021-05-14 (×79): qty 20

## 2021-05-14 MED ORDER — LEVETIRACETAM 100 MG/ML PO SOLN
500.0000 mg | Freq: Two times a day (BID) | ORAL | Status: DC
  Administered 2021-05-14 – 2021-05-26 (×25): 500 mg
  Filled 2021-05-14 (×26): qty 5

## 2021-05-14 MED ORDER — NUTRISOURCE FIBER PO PACK
1.0000 | PACK | Freq: Two times a day (BID) | ORAL | Status: DC
  Administered 2021-05-14 – 2021-05-27 (×24): 1
  Filled 2021-05-14 (×27): qty 1

## 2021-05-14 MED ORDER — POLYETHYLENE GLYCOL 3350 17 G PO PACK
17.0000 g | PACK | Freq: Every day | ORAL | Status: DC | PRN
  Administered 2021-05-22 – 2021-05-27 (×3): 17 g
  Filled 2021-05-14 (×3): qty 1

## 2021-05-14 NOTE — TOC Progression Note (Signed)
Transition of Care [facility]) - Progression Note    Patient Details  Name: Vincent [name] MRN: [id number] Date of Birth: [birth year]  [facility] CM/SW Contact  Ralene Bathe, LCSWA Phone Number: 05/14/2021, 11:49 AM  Clinical Narrative:     CSW received consult in progression for possible LTACH placement for the patient.  CSW spoke with Victorino Dike at [facility] and was informed that the facility is unable to accept medicaid patients.    CSW called Irving Burton with [facility] and inquired about their ability to accept medicaid patients.  CSW was informed that the facility does not accept straight medicaid, but the agency is deciding whether the facility will start accepting managed medicaid. There is no guarantee that the facility will start accepting managed medicaid.   Irving Burton informed CSW that after approximately 20 days, time frame is not definitive, the facility will decide if they will start accepting managed medicaid.    TOC leadership notified of barrier to d/c.  (LTACH's not accepting patient's insurance)   Expected Discharge Plan: Skilled Nursing Facility (Resides with sister ( W/C bound)) Barriers to Discharge: Continued Medical Work up  Expected Discharge Plan and Services Expected Discharge Plan: Skilled Nursing Facility (Resides with sister ( W/C bound))   Discharge Planning Services: CM Consult                                           Social Determinants of Health (SDOH) Interventions    Readmission Risk Interventions No flowsheet data found.

## 2021-05-14 NOTE — Progress Notes (Signed)
NAME:  Vincent [name], MRN:  [id number], DOB:  1971/05/04, LOS: 15 ADMISSION DATE:  04/29/2021, CONSULTATION DATE:  04/29/21 REFERRING MD:  Dr. Jacqulyn Bath, CHIEF COMPLAINT:  Found Down   History of present illness   50 y/o M, admitted to ICU after PEA arrest, likely respiratory driven.  ED note reviewed.  History unobtainable for patient as he is intubated and sedated.  History per chart and sister at bedside.  Patient was not acting right, relatively unresponsive, EMS called.  Found to be agonal breathing.  Unclear initial pulse ox was.  Eventually lost pulse.  CPR was initiated. 1 dose of epi.  About 6 minutes of CPR.  ROSC obtained.  King airway placed. Transported to the ED.  King airway exchanged for ET tube.  Noted to be hypotensive so norepinephrine started peripherally.  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  CTA PE protocol monitor patient reveals no PE, significant bilateral airspace disease was dense consolidations in the lower lobes, significant emphysema in upper lobes with interstitial thickening felt to be most consistent with pneumonitis/infection versus volume overload.  Moderate pericardial effusion noted by the radiologist.  Other findings as below.  He was given broad-spectrum antibiotics.  Initial lactate over 10.  1 L crystalloid given.  At time of evaluation he is on fentanyl drip and propofol.  Past Medical History  Tobacco Use Emphysema CVA DM  Significant Hospital Events   8/2: PEA Arrest, King airway in the field, CPR x6 minutes and epi x1, ROSC, Intubated in ED, Admitted to PCCM  8/4: Weaned off TTM, [place] O2 sat of 47%, started on milrinone and lasix 8/5 milrinone increased 8/6 TF stopped, OG placed to suction for ileus 8/8 Post pyloric Cortrak placed, myoclonic jerking, started Keppra, depakote 8/10 Completed antibiotic course for pneumonia. Family meeting transitioned to DNR. 8/15 Tracheostomy   Procedures:  [facility]: 8/3 d/ced on 8/12 Cortrak:  8/8  Significant Diagnostic Tests:   CT HEAD WO CONTRAST ( )   Result Date: 04/29/2021 CLINICAL DATA:  Mental status change EXAM: CT HEAD WITHOUT CONTRAST TECHNIQUE: Contiguous axial images were obtained from the base of the skull through the vertex without intravenous contrast. COMPARISON:  None. FINDINGS: Brain: No acute territorial infarction, hemorrhage or intracranial mass is visualized. Chronic right MCA infarct with extensive encephalomalacia involving the right frontal, parietal and temporal lobes as well as the right thalamus and basal ganglia. Moderate atrophy. Ex vacuo dilatation of right lateral ventricle. Atrophy of right brainstem. Vascular: No hyperdense vessels.  Carotid vascular calcification Skull: Normal. Negative for fracture or focal lesion. Sinuses/Orbits: Mucosal thickening in the sinuses. Chronic appearing deformity of the medial wall right orbit. Other: Incomplete fusion posterior arch of C1 IMPRESSION: 1. No definite CT evidence for acute intracranial abnormality. 2. Atrophy and chronic right MCA infarct. Electronically Signed   By: Jasmine Pang M.D.   On: 04/29/2021 22:09   CT Angio Chest PE W and/or Wo Contrast   Result Date: 04/29/2021 CLINICAL DATA:  PE suspected, high prob Post CPR. EXAM: CT ANGIOGRAPHY CHEST WITH CONTRAST TECHNIQUE: Multidetector CT imaging of the chest was performed using the standard protocol during bolus administration of intravenous contrast. Multiplanar CT image reconstructions and MIPs were obtained to evaluate the vascular anatomy. CONTRAST:  46mL OMNIPAQUE IOHEXOL 350 MG/ML SOLN COMPARISON:  Chest radiograph earlier today. FINDINGS: Cardiovascular: There are no filling defects within the pulmonary arteries to suggest pulmonary embolus. Mild aortic atherosclerosis. Cannot assess for dissection given phase of contrast tailored to  pulmonary arteries S1. Multi chamber cardiomegaly. Minimal contrast refluxes into the hepatic veins and IVC. Moderate size  circumferential pericardial effusion. This measures up to 17 mm in depth adjacent to the right ventricle. Mediastinum/Nodes: Shotty mediastinal adenopathy, including right anterior paratracheal node measuring 10 mm, series 5, image 37. Bilateral hilar lymph nodes measuring 9-10 mm. The esophagus is decompressed by enteric tube. No visualized thyroid nodule. Lungs/Pleura: The endotracheal tube tip is at the level of the carina, recommend retraction of 2-3 cm. Dense lower lobe consolidation, left greater than right, suspicious for aspiration. There additional patchy, ground-glass and confluent airspace opacities throughout both lungs. Mild smooth septal thickening. Underlying emphysema which is partially obscured by superimposed airspace disease. There is a 2.1 x 2.6 cm nodular density posteriorly in the left upper lobe abutting the pleura, series 6, image 26, partially obscured by adjacent airspace disease. Small bilateral pleural effusions, as well as fluid tracking into the right minor fissure. No pneumothorax. Upper Abdomen: No adrenal nodule. No acute upper abdominal findings. Probable scarring in the upper left kidney. Motion obscures evaluation of the upper abdomen. Musculoskeletal: No acute osseous abnormality. No anterior rib fractures typically seen with CPR. No focal bone lesion. Review of the MIP images confirms the above findings. IMPRESSION: 1. No pulmonary embolus. 2. Multi chamber cardiomegaly with moderate circumferential pericardial effusion. Minimal contrast refluxes into the hepatic veins and IVC consistent with elevated right heart pressures. 3. Dense lower lobe consolidation, left greater than right, suspicious for aspiration. Small bilateral pleural effusions. 4. There is set the thickening in ground-glass opacities suspicious for pulmonary edema. Superimposed airspace disease within there is a ground-glass opacity may represent confluent edema or infection. 5. Shotty mediastinal and hilar  adenopathy is likely reactive, but nonspecific. 6. Endotracheal tube tip is at the level of the carina, recommend retraction of 2-3 cm. 7. A 2.1 x 2.6 cm nodular density posteriorly in the left upper lobe abutting the pleura is nonspecific given the adjacent parenchymal findings, however recommend attention at follow-up to exclude the possibility of pulmonary mass. Aortic Atherosclerosis (ICD10-I70.0) and Emphysema (ICD10-J43.9). Electronically Signed   By: Narda Rutherford M.D.   On: 04/29/2021 22:02   DG Chest Portable 1 View   Result Date: 04/29/2021 CLINICAL DATA:  Post CPR.  Cardiac arrest EXAM: PORTABLE CHEST 1 VIEW COMPARISON:  None FINDINGS: Endotracheal tube terminates 2.2 cm above carina. External pacer/defibrillator is. Midline trachea. Mild cardiomegaly. No pleural effusion or pneumothorax. Interstitial and airspace disease is relatively diffuse but greater on the left than right. IMPRESSION: Appropriate position of endotracheal tube. Cardiomegaly with left greater than right interstitial and airspace disease. Favor asymmetric pulmonary edema. Given asymmetry, aspiration is possible but felt less likely. Electronically Signed   By: Jeronimo Greaves M.D.   On: 04/29/2021 20:53   EEG adult   Result Date: 04/30/2021 Rejeana Brock, MD     04/30/2021  2:15 AM History: 50 year old male status post cardiac arrest Sedation: Propofol Technique: This is a 21 channel routine scalp EEG performed at the bedside with bipolar and monopolar montages arranged in accordance to the international 10/20 system of electrode placement. One channel was dedicated to EKG recording. Background: The background is diffusely attenuated with ventilator artifact.  There is some degree of muscle artifact throughout most of the recording, but no definite background activity is seen.  He has several episodes of "shaking" without definite EEG change, other than significant muscle artifact.  With one of these episodes muscle  artifact does significantly obscure  the background, but to a degree I can tell there was no significant EEG change. Photic stimulation: Physiologic driving is not performed EEG Abnormalities: Diffusely attenuated background Clinical Interpretation: This EEG is severely abnormal with diffuse attenuation of the background.  There was no evidence of the muscle jerking seen was epileptiform in nature.  There was no seizure or seizure predisposition recorded on this study. Please note that lack of epileptiform activity on EEG does not preclude the possibility of epilepsy. Ritta Slot, MD [facility] [phone] If 7pm- 7am, please page neurology on call as listed in AMION.   Overnight EEG with video   Result Date: 04/30/2021 Charlsie Quest, MD     04/30/2021  9:03 AM Patient Name: Roald Lukacs MRN: [id number] Epilepsy Attending: Charlsie Quest Referring Physician/Provider: Rutherford Guys, PA Duration: 04/30/2021 0202 to 04/30/2021 0900 Patient history: 50 year old male status post cardiac arrest. EEG to evaluate for seizure Level of alertness:  comatose AEDs during EEG study: LEV, propofol Technical aspects: This EEG study was done with scalp electrodes positioned according to the 10-20 International system of electrode placement. Electrical activity was acquired at a sampling rate of 500Hz  and reviewed with a high frequency filter of 70Hz  and a low frequency filter of 1Hz . EEG data were recorded continuously and digitally stored. Description: EEG showed continuous generalized background suppression.  EEG was not reactive to tactile stimulation.  Event button was pressed on 04/30/2021 at 0752 for tremors in arms and chest. Concomitant EEG before, during and after the event did not show any EEG changes suggest seizure. Hyperventilation and photic stimulation were not performed.   ABNORMALITY -Background suppression, generalized IMPRESSION: This study is suggestive of profound diffuse  encephalopathy, nonspecific to etiology.  However with a history of cardiac arrest this could be secondary to anoxic/hypoxic brain injury, sedation.  No seizures or epileptiform discharges were seen throughout the recording. Event button was pressed on 04/30/2021 at 0752 for tremors in arms and chest without concomitant EEG change. This was most likely not an epileptic event.        ECHOCARDIOGRAM REPORT Patient Name:   [facility] Date of Exam: 04/30/2021  Medical Rec #:  [facility]               Height:       68.0 in  Accession #:    [facility]              Weight:       154.5 lb  Date of Birth:  November 17, 1970                BSA:          1.832 m  Patient Age:    50 years                BP:           69/54 mmHg  Patient Gender: M                       HR:           110 bpm.  Exam Location:  Inpatient   Procedure: 2D Echo, Cardiac Doppler and Color Doppler   Indications:    Cardiac Arrest I46.9     History:        Patient has no prior history of Echocardiogram  examinations.     Sonographer:    Tiffany Dance  Referring Phys: [id number] RAHUL P  DESAI      Sonographer Comments: Echo performed with patient supine and on artificial  respirator. Reading Cardiologist notified.  IMPRESSIONS     1. Minor contractile sparing of the lateral wall. Left ventricular  ejection fraction, by estimation, is <20%. The left ventricle has severely  decreased function. The left ventricle demonstrates global hypokinesis.  Left ventricular diastolic parameters  are consistent with Grade III diastolic dysfunction (restrictive).   2. Right ventricular systolic function is normal. The right ventricular  size is normal. There is mildly elevated pulmonary artery systolic  pressure. The estimated right ventricular systolic pressure is 40.8 mmHg.   3. A small pericardial effusion is present. The pericardial effusion is  circumferential. There is no evidence of cardiac tamponade.   4. The  mitral valve is normal in structure. Trivial mitral valve  regurgitation. No evidence of mitral stenosis.   5. The aortic valve is normal in structure. Aortic valve regurgitation is  not visualized. No aortic stenosis is present.   6. The inferior vena cava is dilated in size with <50% respiratory  variability, suggesting right atrial pressure of 15 mmHg.   FINDINGS   Left Ventricle: Minor contractile sparing of the lateral wall. Left  ventricular ejection fraction, by estimation, is <20%. The left ventricle  has severely decreased function. The left ventricle demonstrates global  hypokinesis. The left ventricular  internal cavity size was normal in size. There is no left ventricular  hypertrophy. Left ventricular diastolic parameters are consistent with  Grade III diastolic dysfunction (restrictive).   Right Ventricle: The right ventricular size is normal. No increase in  right ventricular wall thickness. Right ventricular systolic function is  normal. There is mildly elevated pulmonary artery systolic pressure. The  tricuspid regurgitant velocity is 2.54   m/s, and with an assumed right atrial pressure of 15 mmHg, the estimated  right ventricular systolic pressure is 40.8 mmHg.   Left Atrium: Left atrial size was normal in size.   Right Atrium: Right atrial size was normal in size.   Pericardium: A small pericardial effusion is present. The pericardial  effusion is circumferential. There is no evidence of cardiac tamponade.   Mitral Valve: The mitral valve is normal in structure. Trivial mitral  valve regurgitation. No evidence of mitral valve stenosis.   Tricuspid Valve: The tricuspid valve is normal in structure. Tricuspid  valve regurgitation is not demonstrated. No evidence of tricuspid  stenosis.   Aortic Valve: The aortic valve is normal in structure. Aortic valve  regurgitation is not visualized. No aortic stenosis is present.   Pulmonic Valve: The pulmonic valve was  normal in structure. Pulmonic valve  regurgitation is trivial. No evidence of pulmonic stenosis.   Aorta: The aortic root is normal in size and structure.   Venous: The inferior vena cava is dilated in size with less than 50%  respiratory variability, suggesting right atrial pressure of 15 mmHg.   IAS/Shunts: No atrial level shunt detected by color flow Doppler.     Diastology  LV e' medial:   2.08 cm/s  LV E/e' medial: 28.9    RIGHT VENTRICLE            IVC  RV Basal diam:  3.30 cm    IVC diam: 2.30 cm  RV Mid diam:    2.70 cm  RV S prime:     5.06 cm/s  TAPSE (M-mode): 0.9 cm   LEFT ATRIUM  Index       RIGHT ATRIUM           Index  LA Vol (A2C):   50.8 ml 27.74 ml/m RA Area:     12.50 cm  LA Vol (A4C):   37.5 ml 20.47 ml/m RA Volume:   31.20 ml  17.03 ml/m  LA Biplane Vol: 44.6 ml 24.35 ml/m   AORTIC VALVE  LVOT Vmax:   30.30 cm/s  LVOT Vmean:  22.250 cm/s  LVOT VTI:    0.037 m     AORTA  Ao Asc diam: 2.70 cm   MITRAL VALVE               TRICUSPID VALVE  MV Area (PHT): 3.66 cm    TR Peak grad:   25.8 mmHg  MV Decel Time: 207 msec    TR Vmax:        254.00 cm/s  MV E velocity: 60.10 cm/s  MV A velocity: 41.70 cm/s  SHUNTS  MV E/A ratio:  1.44        Systemic VTI: 0.04 m No results found.   Micro Data:  MRSA swab: Negative Resp Panel: COVID, FLU negative  Antimicrobials:  Cefepime: 8/2>8/2 Vanc: 8/2>8/2 Azithromax: 8/2>>8/4 Ceftriaxone: 8/3>>>8/8 Augmentin: 8/8>>8/10  Interim history/subjective:  O/N: No events  Non-communicative this AM.  Objective   Blood pressure (!) 120/99, pulse (!) 121, temperature 99.7 F (37.6 C), temperature source Axillary, resp. rate 17, height  (1.727 m), weight 68.5 kg, SpO2 99 %.    Vent Mode: PRVC FiO2 (%):  [40 %] 40 % Set Rate:  [14 bmp] 14 bmp Vt Set:  [550 mL] 550 mL PEEP:  [5 cmH20] 5 cmH20 Pressure Support:  [10 cmH20] 10 cmH20 Plateau Pressure:  [17 cmH20-18 cmH20] 18 cmH20   Intake/Output  Summary (Last 24 hours) at 05/14/2021 0711 Last data filed at 05/14/2021 7829 Gross per 24 hour  Intake 1661.6 ml  Output 1550 ml  Net 111.6 ml    Filed Weights   05/12/21 0244 05/13/21 0500 05/14/21 0500  Weight: 67.2 kg 68.3 kg 68.5 kg    Physical Exam Constitutional:      Appearance: He is ill-appearing. He is not diaphoretic.     Comments: Awake, non communicative, ill appearing male  Eyes:     General: No scleral icterus.       Right eye: No discharge.        Left eye: No discharge.     Conjunctiva/sclera: Conjunctivae normal.     Pupils: Pupils are equal, round, and reactive to light.  Neck:     Comments: ETT in place Cardiovascular:     Rate and Rhythm: Regular rhythm. Tachycardia present.     Pulses: Normal pulses.     Heart sounds: Normal heart sounds. No murmur heard.   No friction rub. No gallop.  Pulmonary:     Comments: Coarse sounds bilaterally, no wheezing.  Abdominal:     General: Abdomen is flat. Bowel sounds are normal.     Palpations: Abdomen is soft.     Tenderness: There is no abdominal tenderness.  Musculoskeletal:     Right lower leg: No edema.     Left lower leg: No edema.  Skin:    General: Skin is warm.  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 Family discussion 8/10 significant for continued care, but CODE STATUS was changed to DNR. S/P Trach 8/15. CT abdomen shows anatomy favorable for PEG placement, will reach out to discuss scheduling.  - Appreciate Neurology's assistance  - Continue Keppra IV or PO - Continue PRN Klonopin for myoclonus  Cardiogenic Shock PEA Cardiac Arrest Heart Failure with  Reduced Ejection Fraction:  EF of 20-25%. Off milrinone.  - Increase Lopressor to 50 mg TID  - Goal of K>4.0 and magnesium >2.0. Replete as necessary - Trend BMP and magnesium    Acute Hypoxemic Respiratory Failure Requiring MV:  HAP: S/P Trach placement on 8/15. No febrile events O/N, Leukocytosis is down trending to 11.8 from 12.3. - Continue mechanical ventilatory support - Continue daily SBT and SAT as tolerated. Continue PS trials as tolerated.  - VAP protocol   Diabetes Mellitus Type II: - Continue SSI   Lung Mass: 2.1x2.6cm nodular density posteriorly in the left upper lobe - Will need outpatient follow up if survives this hospitalization  Anemia Likely acute on chronic illness - Monitor with CBC  - Transfuse if Hgb<7  Best practice:  Diet/type: tubefeeds. Will need peg later this week.  DVT prophylaxis: prophylactic heparin  GI prophylaxis: PPI Lines: none.  Foley: Condom Cath  Code Status: DNR Last date of multidisciplinary goals of care discussion 8/10 discussed with patient's sister and with Dr. Wilford Corner in neurology. Patient made DNR but to continue all aggressive care.  Family Communication:will update today Disposition: continues to require ICU.   Labs   CBC: Recent Labs  Lab 05/09/21 0705 05/10/21 0145 05/12/21 0111 05/13/21 0913 05/14/21 0027  WBC 13.2* 12.8* 12.2* 12.3* 11.8*  HGB 13.0 12.3* 11.3* 10.9* 10.5*  HCT 40.6 38.0* 35.1* 33.7* 33.1*  MCV 89.6 88.6 88.0 89.2 89.2  PLT 487* 482* 464* 473* 453*     Basic Metabolic Panel: Recent Labs  Lab 05/08/21 0908 05/09/21 0437 05/09/21 0705 05/10/21 0145 05/12/21 0111 05/13/21 0913 05/14/21 0027  NA  --  143  --  140 137 140 141  K  --  3.8  --  5.0 4.4 4.4 4.6  CL  --  110  --  107 104 104 107  CO2  --  25  --  GLUCOSE  --  115*  --  116* 118* 127* 128*  BUN  --  36*  --  34* 33* 34* 34*  CREATININE  --  1.63*  --  1.58* 1.23 1.35* 1.17  CALCIUM  --  8.9  --  8.9 8.9 9.1 9.4   MG 2.5*  --  2.6*  --   --  2.0  --     GFR: Estimated Creatinine Clearance: 72.3 mL/min (by C-G formula based on SCr of 1.17 mg/dL). Recent Labs  Lab 05/10/21 0145 05/12/21 0111 05/13/21 0913 05/14/21 0027  WBC 12.8* 12.2* 12.3* 11.8*     Liver Function Tests: No results for input(s): AST, ALT, ALKPHOS, BILITOT, PROT, ALBUMIN in the last 168 hours.  No results for input(s): LIPASE, AMYLASE in the last 168 hours. No results for input(s): AMMONIA in the last 168 hours.  ABG    Component Value Date/Time   PHART 7.494 (H) 05/02/2021 1822   PCO2ART 30.6 (L) 05/02/2021 1822   PO2ART 95 05/02/2021 1822  HCO3 23.5 05/02/2021 1822   TCO2 24 05/02/2021 1822   ACIDBASEDEF 9.0 (H) 04/30/2021 0815   O2SAT 70.2 05/08/2021 0317   Coagulation Profile: No results for input(s): INR, PROTIME in the last 168 hours.   Cardiac Enzymes: No results for input(s): CKTOTAL, CKMB, CKMBINDEX, TROPONINI in the last 168 hours.  HbA1C: Hgb A1c MFr Bld  Date/Time Value Ref Range Status  04/30/2021 12:22 AM 5.8 (H) 4.8 - 5.6 % Final    Comment:    (NOTE) Pre diabetes:          5.7%-6.4%  Diabetes:              >6.4%  Glycemic control for   <7.0% adults with diabetes     CBG: Recent Labs  Lab 05/13/21 1113 05/13/21 1523 05/13/21 1947 05/14/21 0001 05/14/21 0357  GLUCAP 116* 125* 135* 124* 130*    Dolan Amen, MD [facility], PGY-3 Pager: [phone] 05/14/2021,7:11 AM   If no response to pager , please call critical care on call (see AMION) until 7pm After 7:00 pm call Elink

## 2021-05-14 NOTE — Progress Notes (Signed)
RT note-Attempted to wean this morning on ATC, failed and then placed to PSV 10/5 which was tolerated for 3 hours. Increased RR and work of breathing. Currently patient is on full support and work of breathing is improved.

## 2021-05-15 DIAGNOSIS — I469 Cardiac arrest, cause unspecified: Secondary | ICD-10-CM | POA: Diagnosis not present

## 2021-05-15 DIAGNOSIS — J9601 Acute respiratory failure with hypoxia: Secondary | ICD-10-CM | POA: Diagnosis not present

## 2021-05-15 DIAGNOSIS — J690 Pneumonitis due to inhalation of food and vomit: Secondary | ICD-10-CM

## 2021-05-15 DIAGNOSIS — N179 Acute kidney failure, unspecified: Secondary | ICD-10-CM | POA: Diagnosis not present

## 2021-05-15 LAB — BASIC METABOLIC PANEL
Anion gap: 11 (ref 5–15)
BUN: 35 mg/dL — ABNORMAL HIGH (ref 6–20)
CO2: 24 mmol/L (ref 22–32)
Calcium: 9.6 mg/dL (ref 8.9–10.3)
Chloride: 105 mmol/L (ref 98–111)
Creatinine, Ser: 1.23 mg/dL (ref 0.61–1.24)
GFR, Estimated: >60 mL/min (ref >60)
Glucose, Bld: 112 mg/dL — ABNORMAL HIGH (ref 70–99)
Potassium: 4.3 mmol/L (ref 3.5–5.1)
Sodium: 140 mmol/L (ref 135–145)

## 2021-05-15 LAB — CBC
HCT: 31.8 % — ABNORMAL LOW (ref 39.0–52.0)
Hemoglobin: 10.3 g/dL — ABNORMAL LOW (ref 13.0–17.0)
MCH: 28.7 pg (ref 26.0–34.0)
MCHC: 32.4 g/dL (ref 30.0–36.0)
MCV: 88.6 fL (ref 80.0–100.0)
Platelets: 453 10*3/uL — ABNORMAL HIGH (ref 150–400)
RBC: 3.59 MIL/uL — ABNORMAL LOW (ref 4.22–5.81)
RDW: 14.8 % (ref 11.5–15.5)
WBC: 13.8 10*3/uL — ABNORMAL HIGH (ref 4.0–10.5)
nRBC: 0 % (ref 0.0–0.2)

## 2021-05-15 LAB — MAGNESIUM: Magnesium: 2.2 mg/dL (ref 1.7–2.4)

## 2021-05-15 LAB — GLUCOSE, CAPILLARY
Glucose-Capillary: 107 mg/dL — ABNORMAL HIGH (ref 70–99)
Glucose-Capillary: 119 mg/dL — ABNORMAL HIGH (ref 70–99)
Glucose-Capillary: 129 mg/dL — ABNORMAL HIGH (ref 70–99)
Glucose-Capillary: 130 mg/dL — ABNORMAL HIGH (ref 70–99)
Glucose-Capillary: 131 mg/dL — ABNORMAL HIGH (ref 70–99)
Glucose-Capillary: 140 mg/dL — ABNORMAL HIGH (ref 70–99)

## 2021-05-15 MED ORDER — LISINOPRIL 10 MG PO TABS
5.0000 mg | ORAL_TABLET | Freq: Every day | ORAL | Status: DC
  Administered 2021-05-15 – 2021-05-20 (×6): 5 mg via ORAL
  Filled 2021-05-15 (×6): qty 1

## 2021-05-15 MED ORDER — JEVITY 1.2 CAL PO LIQD
1000.0000 mL | ORAL | Status: DC
  Administered 2021-05-15 – 2021-05-22 (×7): 1000 mL
  Filled 2021-05-15 (×12): qty 1000

## 2021-05-15 MED ORDER — PROSOURCE TF PO LIQD
45.0000 mL | Freq: Four times a day (QID) | ORAL | Status: DC
  Administered 2021-05-15 – 2021-05-22 (×28): 45 mL
  Filled 2021-05-15 (×28): qty 45

## 2021-05-15 MED ORDER — ACETAMINOPHEN 160 MG/5ML PO SOLN
650.0000 mg | Freq: Four times a day (QID) | ORAL | Status: DC | PRN
  Administered 2021-05-15 – 2021-06-12 (×12): 650 mg
  Filled 2021-05-15 (×12): qty 20.3

## 2021-05-15 MED ORDER — PIPERACILLIN-TAZOBACTAM 3.375 G IVPB
3.3750 g | Freq: Three times a day (TID) | INTRAVENOUS | Status: DC
  Administered 2021-05-15 – 2021-05-16 (×3): 3.375 g via INTRAVENOUS
  Filled 2021-05-15 (×5): qty 50

## 2021-05-15 NOTE — Progress Notes (Signed)
NAME:  Vincent ChangRedric Shawn [name], MRN:  [id number], DOB:  06/22/1971, LOS: 16 ADMISSION DATE:  04/29/2021, CONSULTATION DATE:  04/29/21 REFERRING MD:  Dr. Jacqulyn BathLong, CHIEF COMPLAINT:  Found Down   History of present illness   50 y/o M, admitted to ICU after PEA arrest, likely respiratory driven.  ED note reviewed.  History unobtainable for patient as he is intubated and sedated.  History per chart and sister at bedside.  Patient was not acting right, relatively unresponsive, EMS called.  Found to be agonal breathing.  Unclear initial pulse ox was.  Eventually lost pulse. CPR was initiated. 1 dose of epi.  About 6 minutes of CPR.  ROSC obtained. King airway placed. Transported to the ED.  King airway exchanged for ET tube. Noted to be hypotensive so norepinephrine started peripherally.  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  CTA PE protocol monitor patient reveals no PE, significant bilateral airspace disease was dense consolidations in the lower lobes, significant emphysema in upper lobes with interstitial thickening felt to be most consistent with pneumonitis/infection versus volume overload.  Moderate pericardial effusion noted by the radiologist.  Other findings as below.  He was given broad-spectrum antibiotics.  Initial lactate over 10.  1 L crystalloid given.  At time of evaluation he is on fentanyl drip and propofol.  Past Medical History  Tobacco Use Emphysema CVA DM  Significant Hospital Events   8/2: PEA Arrest, King airway in the field, CPR x6 minutes and epi x1, ROSC, Intubated in ED, Admitted to PCCM  8/4: Weaned off TTM, [place] O2 sat of 47%, started on milrinone and lasix 8/5 milrinone increased 8/6 TF stopped, OG placed to suction for ileus 8/8 Post pyloric Cortrak placed, myoclonic jerking, started Keppra, depakote 8/10 Completed antibiotic course for pneumonia. Family meeting transitioned to DNR. 8/15 Tracheostomy   Procedures:  [facility]: 8/3 d/ced on 8/12 Cortrak:  8/8  Significant Diagnostic Tests:   CT HEAD WO CONTRAST (5MM)   Result Date: 04/29/2021 CLINICAL DATA:  Mental status change EXAM: CT HEAD WITHOUT CONTRAST TECHNIQUE: Contiguous axial images were obtained from the base of the skull through the vertex without intravenous contrast. COMPARISON:  None. FINDINGS: Brain: No acute territorial infarction, hemorrhage or intracranial mass is visualized. Chronic right MCA infarct with extensive encephalomalacia involving the right frontal, parietal and temporal lobes as well as the right thalamus and basal ganglia. Moderate atrophy. Ex vacuo dilatation of right lateral ventricle. Atrophy of right brainstem. Vascular: No hyperdense vessels.  Carotid vascular calcification Skull: Normal. Negative for fracture or focal lesion. Sinuses/Orbits: Mucosal thickening in the sinuses. Chronic appearing deformity of the medial wall right orbit. Other: Incomplete fusion posterior arch of C1 IMPRESSION: 1. No definite CT evidence for acute intracranial abnormality. 2. Atrophy and chronic right MCA infarct. Electronically Signed   By: Jasmine PangKim  Fujinaga M.D.   On: 04/29/2021 22:09   CT Angio Chest PE W and/or Wo Contrast   Result Date: 04/29/2021 CLINICAL DATA:  PE suspected, high prob Post CPR. EXAM: CT ANGIOGRAPHY CHEST WITH CONTRAST TECHNIQUE: Multidetector CT imaging of the chest was performed using the standard protocol during bolus administration of intravenous contrast. Multiplanar CT image reconstructions and MIPs were obtained to evaluate the vascular anatomy. CONTRAST:  50mL OMNIPAQUE IOHEXOL 350 MG/ML SOLN COMPARISON:  Chest radiograph earlier today. FINDINGS: Cardiovascular: There are no filling defects within the pulmonary arteries to suggest pulmonary embolus. Mild aortic atherosclerosis. Cannot assess for dissection given phase of contrast tailored to pulmonary arteries S1.  Multi chamber cardiomegaly. Minimal contrast refluxes into the hepatic veins and IVC. Moderate size  circumferential pericardial effusion. This measures up to 17 mm in depth adjacent to the right ventricle. Mediastinum/Nodes: Shotty mediastinal adenopathy, including right anterior paratracheal node measuring 10 mm, series 5, image 37. Bilateral hilar lymph nodes measuring 9-10 mm. The esophagus is decompressed by enteric tube. No visualized thyroid nodule. Lungs/Pleura: The endotracheal tube tip is at the level of the carina, recommend retraction of 2-3 cm. Dense lower lobe consolidation, left greater than right, suspicious for aspiration. There additional patchy, ground-glass and confluent airspace opacities throughout both lungs. Mild smooth septal thickening. Underlying emphysema which is partially obscured by superimposed airspace disease. There is a 2.1 x 2.6 cm nodular density posteriorly in the left upper lobe abutting the pleura, series 6, image 26, partially obscured by adjacent airspace disease. Small bilateral pleural effusions, as well as fluid tracking into the right minor fissure. No pneumothorax. Upper Abdomen: No adrenal nodule. No acute upper abdominal findings. Probable scarring in the upper left kidney. Motion obscures evaluation of the upper abdomen. Musculoskeletal: No acute osseous abnormality. No anterior rib fractures typically seen with CPR. No focal bone lesion. Review of the MIP images confirms the above findings. IMPRESSION: 1. No pulmonary embolus. 2. Multi chamber cardiomegaly with moderate circumferential pericardial effusion. Minimal contrast refluxes into the hepatic veins and IVC consistent with elevated right heart pressures. 3. Dense lower lobe consolidation, left greater than right, suspicious for aspiration. Small bilateral pleural effusions. 4. There is set the thickening in ground-glass opacities suspicious for pulmonary edema. Superimposed airspace disease within there is a ground-glass opacity may represent confluent edema or infection. 5. Shotty mediastinal and hilar  adenopathy is likely reactive, but nonspecific. 6. Endotracheal tube tip is at the level of the carina, recommend retraction of 2-3 cm. 7. A 2.1 x 2.6 cm nodular density posteriorly in the left upper lobe abutting the pleura is nonspecific given the adjacent parenchymal findings, however recommend attention at follow-up to exclude the possibility of pulmonary mass. Aortic Atherosclerosis (ICD10-I70.0) and Emphysema (ICD10-J43.9). Electronically Signed   By: Narda Rutherford M.D.   On: 04/29/2021 22:02   DG Chest Portable 1 View   Result Date: 04/29/2021 CLINICAL DATA:  Post CPR.  Cardiac arrest EXAM: PORTABLE CHEST 1 VIEW COMPARISON:  None FINDINGS: Endotracheal tube terminates 2.2 cm above carina. External pacer/defibrillator is. Midline trachea. Mild cardiomegaly. No pleural effusion or pneumothorax. Interstitial and airspace disease is relatively diffuse but greater on the left than right. IMPRESSION: Appropriate position of endotracheal tube. Cardiomegaly with left greater than right interstitial and airspace disease. Favor asymmetric pulmonary edema. Given asymmetry, aspiration is possible but felt less likely. Electronically Signed   By: Jeronimo Greaves M.D.   On: 04/29/2021 20:53   EEG adult   Result Date: 04/30/2021 Rejeana Brock, MD     04/30/2021  2:15 AM History: 50 year old male status post cardiac arrest Sedation: Propofol Technique: This is a 21 channel routine scalp EEG performed at the bedside with bipolar and monopolar montages arranged in accordance to the international 10/20 system of electrode placement. One channel was dedicated to EKG recording. Background: The background is diffusely attenuated with ventilator artifact.  There is some degree of muscle artifact throughout most of the recording, but no definite background activity is seen.  He has several episodes of "shaking" without definite EEG change, other than significant muscle artifact.  With one of these episodes muscle  artifact does significantly obscure the background, but  to a degree I can tell there was no significant EEG change. Photic stimulation: Physiologic driving is not performed EEG Abnormalities: Diffusely attenuated background Clinical Interpretation: This EEG is severely abnormal with diffuse attenuation of the background.  There was no evidence of the muscle jerking seen was epileptiform in nature.  There was no seizure or seizure predisposition recorded on this study. Please note that lack of epileptiform activity on EEG does not preclude the possibility of epilepsy. Ritta Slot, MD [facility] [phone] If 7pm- 7am, please page neurology on call as listed in AMION.   Overnight EEG with video   Result Date: 04/30/2021 Charlsie Quest, MD     04/30/2021  9:03 AM Patient Name: Barclay Lennox MRN: [id number] Epilepsy Attending: Charlsie Quest Referring Physician/Provider: Rutherford Guys, PA Duration: 04/30/2021 0202 to 04/30/2021 0900 Patient history: 50 year old male status post cardiac arrest. EEG to evaluate for seizure Level of alertness:  comatose AEDs during EEG study: LEV, propofol Technical aspects: This EEG study was done with scalp electrodes positioned according to the 10-20 International system of electrode placement. Electrical activity was acquired at a sampling rate of 500Hz  and reviewed with a high frequency filter of 70Hz  and a low frequency filter of 1Hz . EEG data were recorded continuously and digitally stored. Description: EEG showed continuous generalized background suppression.  EEG was not reactive to tactile stimulation.  Event button was pressed on 04/30/2021 at 0752 for tremors in arms and chest. Concomitant EEG before, during and after the event did not show any EEG changes suggest seizure. Hyperventilation and photic stimulation were not performed.   ABNORMALITY -Background suppression, generalized IMPRESSION: This study is suggestive of profound diffuse  encephalopathy, nonspecific to etiology.  However with a history of cardiac arrest this could be secondary to anoxic/hypoxic brain injury, sedation.  No seizures or epileptiform discharges were seen throughout the recording. Event button was pressed on 04/30/2021 at 0752 for tremors in arms and chest without concomitant EEG change. This was most likely not an epileptic event.        ECHOCARDIOGRAM REPORT Patient Name:   [facility] Date of Exam: 04/30/2021  Medical Rec #:  [facility]               Height:       68.0 in  Accession #:    [facility]              Weight:       154.5 lb  Date of Birth:  [birth year]                BSA:          1.832 m  Patient Age:    50 years                BP:           69/54 mmHg  Patient Gender: M                       HR:           110 bpm.  Exam Location:  Inpatient   Procedure: 2D Echo, Cardiac Doppler and Color Doppler   Indications:    Cardiac Arrest I46.9     History:        Patient has no prior history of Echocardiogram  examinations.     Sonographer:    [id number] Dance  Referring Phys: [id number] RAHUL P DESAI  Sonographer Comments: Echo performed with patient supine and on artificial  respirator. Reading Cardiologist notified.  IMPRESSIONS     1. Minor contractile sparing of the lateral wall. Left ventricular  ejection fraction, by estimation, is <20%. The left ventricle has severely  decreased function. The left ventricle demonstrates global hypokinesis.  Left ventricular diastolic parameters  are consistent with Grade III diastolic dysfunction (restrictive).   2. Right ventricular systolic function is normal. The right ventricular  size is normal. There is mildly elevated pulmonary artery systolic  pressure. The estimated right ventricular systolic pressure is 40.8 mmHg.   3. A small pericardial effusion is present. The pericardial effusion is  circumferential. There is no evidence of cardiac tamponade.   4. The  mitral valve is normal in structure. Trivial mitral valve  regurgitation. No evidence of mitral stenosis.   5. The aortic valve is normal in structure. Aortic valve regurgitation is  not visualized. No aortic stenosis is present.   6. The inferior vena cava is dilated in size with <50% respiratory  variability, suggesting right atrial pressure of 15 mmHg.   FINDINGS   Left Ventricle: Minor contractile sparing of the lateral wall. Left  ventricular ejection fraction, by estimation, is <20%. The left ventricle  has severely decreased function. The left ventricle demonstrates global  hypokinesis. The left ventricular  internal cavity size was normal in size. There is no left ventricular  hypertrophy. Left ventricular diastolic parameters are consistent with  Grade III diastolic dysfunction (restrictive).   Right Ventricle: The right ventricular size is normal. No increase in  right ventricular wall thickness. Right ventricular systolic function is  normal. There is mildly elevated pulmonary artery systolic pressure. The  tricuspid regurgitant velocity is 2.54   m/s, and with an assumed right atrial pressure of 15 mmHg, the estimated  right ventricular systolic pressure is 40.8 mmHg.   Left Atrium: Left atrial size was normal in size.   Right Atrium: Right atrial size was normal in size.   Pericardium: A small pericardial effusion is present. The pericardial  effusion is circumferential. There is no evidence of cardiac tamponade.   Mitral Valve: The mitral valve is normal in structure. Trivial mitral  valve regurgitation. No evidence of mitral valve stenosis.   Tricuspid Valve: The tricuspid valve is normal in structure. Tricuspid  valve regurgitation is not demonstrated. No evidence of tricuspid  stenosis.   Aortic Valve: The aortic valve is normal in structure. Aortic valve  regurgitation is not visualized. No aortic stenosis is present.   Pulmonic Valve: The pulmonic valve was  normal in structure. Pulmonic valve  regurgitation is trivial. No evidence of pulmonic stenosis.   Aorta: The aortic root is normal in size and structure.   Venous: The inferior vena cava is dilated in size with less than 50%  respiratory variability, suggesting right atrial pressure of 15 mmHg.   IAS/Shunts: No atrial level shunt detected by color flow Doppler.     Diastology  LV e' medial:   2.08 cm/s  LV E/e' medial: 28.9    RIGHT VENTRICLE            IVC  RV Basal diam:  3.30 cm    IVC diam: 2.30 cm  RV Mid diam:    2.70 cm  RV S prime:     5.06 cm/s  TAPSE (M-mode): 0.9 cm   LEFT ATRIUM             Index  RIGHT ATRIUM           Index  LA Vol (A2C):   50.8 ml 27.74 ml/m RA Area:     12.50 cm  LA Vol (A4C):   37.5 ml 20.47 ml/m RA Volume:   31.20 ml  17.03 ml/m  LA Biplane Vol: 44.6 ml 24.35 ml/m   AORTIC VALVE  LVOT Vmax:   30.30 cm/s  LVOT Vmean:  22.250 cm/s  LVOT VTI:    0.037 m     AORTA  Ao Asc diam: 2.70 cm   MITRAL VALVE               TRICUSPID VALVE  MV Area (PHT): 3.66 cm    TR Peak grad:   25.8 mmHg  MV Decel Time: 207 msec    TR Vmax:        254.00 cm/s  MV E velocity: 60.10 cm/s  MV A velocity: 41.70 cm/s  SHUNTS  MV E/A ratio:  1.44        Systemic VTI: 0.04 m DG CHEST PORT 1 VIEW  Result Date: 05/14/2021 CLINICAL DATA:  Respiratory distress. EXAM: PORTABLE CHEST 1 VIEW COMPARISON:  CT abdomen pelvis, 05/13/2021. Chest radiograph, 05/12/2021. FINDINGS: Support lines: Tracheostomy tube with tip at the midthoracic trachea. Enteric feeding tube, with tip excluded from view. Cardiomegaly. Aortic vascular calcifications. Hypoinflation with streaky perihilar opacities, most prominently on the left. No focal consolidation. No large pleural effusion or pneumothorax. No interval osseous abnormality. IMPRESSION: 1. Lines and tubes as above. 2. Cardiomegaly with mild interstitial pulmonary edema. 3. Aortic Atherosclerosis (ICD10-I70.0). Electronically Signed    By: Roanna Banning M.D.   On: 05/14/2021 16:53     Micro Data:  MRSA swab: Negative Resp Panel: COVID, FLU negative Respiratory Culture: Collected  Antimicrobials:  Cefepime: 8/2>8/2 Vanc: 8/2>8/2 Azithromax: 8/2>>8/4 Ceftriaxone: 8/3>>>8/8 Augmentin: 8/8>>8/10 Zosyn: 8/18>  Interim history/subjective:  O/N: No events  Non-communicative this AM.  Objective   Blood pressure 121/80, pulse 87, temperature 99.2 F (37.3 C), temperature source Oral, resp. rate 19, height  (1.727 m), weight 64.9 kg, SpO2 99 %.    Vent Mode: PRVC FiO2 (%):  [40 %] 40 % Set Rate:  [14 bmp] 14 bmp Vt Set:  [550 mL] 550 mL PEEP:  [5 cmH20-8 cmH20] 8 cmH20 Plateau Pressure:  [19 cmH20-24 cmH20] 24 cmH20   Intake/Output Summary (Last 24 hours) at 05/15/2021 1006 Last data filed at 05/15/2021 0936 Gross per 24 hour  Intake 2131.85 ml  Output 1150 ml  Net 981.85 ml   Filed Weights   05/13/21 0500 05/14/21 0500 05/15/21 0330  Weight: 68.3 kg 68.5 kg 64.9 kg   Physical Exam Constitutional:      Appearance: He is ill-appearing.     Comments: Awake to verbal stimuli, non communicative   Cardiovascular:     Rate and Rhythm: Normal rate and regular rhythm.     Pulses: Normal pulses.     Heart sounds: Normal heart sounds. No murmur heard.   No friction rub. No gallop.  Pulmonary:     Effort: Pulmonary effort is normal. No respiratory distress.     Breath sounds: No stridor. Rhonchi present. No wheezing or rales.     Comments: Coarse breath sounds bilaterally  Abdominal:     General: Bowel sounds are normal.     Palpations: Abdomen is soft.     Tenderness: There is no abdominal tenderness. There is no guarding.  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 Family discussion 8/10 significant for continued care, but CODE STATUS was changed to DNR. S/P Trach 8/15. CT abdomen shows anatomy favorable for PEG placement.  - Continue Keppra IV or PO - Continue PRN Klonopin for myoclonus  Cardiogenic Shock PEA Cardiac Arrest Heart Failure with Reduced Ejection Fraction:  EF of 20-25%.  - Continue Lopressor to 50 mg TID  - Start lisinopril 5 mg QD - Goal of K>4.0 and magnesium >2.0. Replete as necessary - Trend BMP and Mg    Acute Hypoxemic Respiratory Failure Requiring MV:  Aspiration Pneumonia: S/P Trach placement on 8/15. Febrile overnight with a Tmax of 102.6, increase in leukocytosis of 13.8, rhonchi appreciated on examination, high risk for aspiration given vent status. Will start antibiotics and collect sputum culture. - Collect Respiratory culture w/ gram stain - Start Zosyn per pharmacy  - Trend fever curve and CBC - Continue mechanical ventilatory support - Continue daily SBT and SAT as tolerated. Continue PS trials as tolerated.  - VAP protocol   Diabetes Mellitus Type II: - Continue SSI   Lung Mass: 2.1x2.6cm nodular density posteriorly in the left upper lobe - Will need outpatient follow up if survives this hospitalization  Anemia Likely acute on chronic illness - Monitor with CBC  - Transfuse if Hgb<7  Best practice:  Diet/type: tubefeeds. Will need peg later this week.  DVT prophylaxis: prophylactic heparin  GI prophylaxis: PPI Lines: none.  Foley: Condom Cath  Code Status: DNR Last date of multidisciplinary goals of care discussion 8/10 discussed with patient's sister and with Dr. Wilford Corner in neurology. Patient made DNR but to continue all aggressive care.  Family Communication:will update today Disposition: continues to require ICU.   Labs   CBC: Recent Labs  Lab 05/10/21 0145 05/12/21 0111  05/13/21 0913 05/14/21 0027 05/15/21 0202  WBC 12.8* 12.2* 12.3* 11.8* 13.8*  HGB 12.3* 11.3* 10.9* 10.5* 10.3*  HCT 38.0* 35.1* 33.7* 33.1* 31.8*  MCV 88.6 88.0 89.2 89.2 88.6  PLT 482* 464* 473* 453* 453*    Basic Metabolic Panel: Recent Labs  Lab 05/09/21 0705 05/10/21 0145 05/12/21 0111 05/13/21 0913 05/14/21 0027 05/15/21 0202  NA  --  140 137 140 141 140  K  --  5.0 4.4 4.4 4.6 4.3  CL  --  107 104 104 107 105  CO2  --  25 25 26 23 24   GLUCOSE  --  116* 118* 127* 128* 112*  BUN  --  34* 33* 34* 34* 35*  CREATININE  --  1.58* 1.23 1.35* 1.17 1.23  CALCIUM  --  8.9 8.9 9.1 9.4 9.6  MG 2.6*  --   --  2.0  --  2.2   GFR: Estimated Creatinine Clearance: 65.2 mL/min (by C-G formula based on SCr of 1.23 mg/dL). Recent Labs  Lab 05/12/21 0111 05/13/21 0913 05/14/21 0027 05/15/21 0202  WBC 12.2* 12.3* 11.8* 13.8*    Liver Function Tests: No results for input(s): AST, ALT, ALKPHOS, BILITOT, PROT, ALBUMIN in the last 168 hours.  No results for input(s): LIPASE, AMYLASE in the last 168 hours. No results for input(s): AMMONIA in the last 168 hours.  ABG    Component Value Date/Time   PHART 7.494 (H) 05/02/2021 1822   PCO2ART 30.6 (L) 05/02/2021 1822   PO2ART 95 05/02/2021 1822   HCO3 23.5 05/02/2021 1822   TCO2 24 05/02/2021 1822   ACIDBASEDEF 9.0 (H) 04/30/2021 0815   O2SAT 70.2 05/08/2021 0317   Coagulation Profile: No results for input(s): INR, PROTIME in the last 168 hours.   Cardiac Enzymes: No results for input(s): CKTOTAL, CKMB, CKMBINDEX, TROPONINI in the last 168 hours.  HbA1C: Hgb A1c MFr Bld  Date/Time Value Ref Range Status  04/30/2021 12:22 AM 5.8 (H) 4.8 - 5.6 % Final    Comment:    (NOTE) Pre diabetes:          5.7%-6.4%  Diabetes:              >6.4%  Glycemic control for   <7.0% adults with diabetes     CBG: Recent Labs  Lab 05/14/21 1517 05/14/21 2002 05/14/21 2315 05/15/21 0323 05/15/21 0732  GLUCAP 113* 135* 133* 129*  107*   Dolan Amen, MD [facility], PGY-3 Pager: [phone] 05/15/2021,10:06 AM   If no response to pager , please call critical care on call (see AMION) until 7pm After 7:00 pm call Elink

## 2021-05-15 NOTE — Progress Notes (Signed)
Interventional Radiology Brief Note:  IR consulted for percutaneous gastrostomy tube placement.  Patient anatomy appears amenable to placement.  Of note, he is requiring full vent support and speech assessment has been postponed.  He has been increasingly febrile- Tmax 102.6 overnight, WBC increased to 13.8.   IR following for possible gastrostomy tube placement once medically ready.   Loyce Dys, MS RD PA-C

## 2021-05-15 NOTE — Progress Notes (Signed)
Pharmacy Antibiotic Note  Vincent [name] is a 50 y.o. male admitted on 04/29/2021 with sepsis for which the patient completed a full course of abx. Pt is currently requiring full ventilatory support.  Patient's WBC have remained elevated since 8/11. 8/18 WBC increased to 13.8 and patient is febrile (Tm 102.6) with concerns for possible aspiration PNA/VAP. Pharmacy has been consulted for zosyn dosing.   Plan: Zosyn 3.375 g IV Q8H  -Monitor renal function, clinical status, and antibiotic plan  Height: 5\' 8"  (172.7 cm) Weight: 64.9 kg (143 lb 1.3 oz) IBW/kg (Calculated) : 68.4  Temp (24hrs), Avg:100.3 F (37.9 C), Min:98.8 F (37.1 C), Max:102.6 F (39.2 C)  Recent Labs  Lab 05/10/21 0145 05/12/21 0111 05/13/21 0913 05/14/21 0027 05/15/21 0202  WBC 12.8* 12.2* 12.3* 11.8* 13.8*  CREATININE 1.58* 1.23 1.35* 1.17 1.23     Estimated Creatinine Clearance: 65.2 mL/min (by C-G formula based on SCr of 1.23 mg/dL).    No Known Allergies  Antimicrobials this admission: Cefepime 8/3 x1   Vanc 8/3 x1 Azithromycin 8/3 x1  Ceftriaxone 8/3>>8/7  Augmentin 8/8>>8/9  Zosyn 8/18>>    Dose adjustments this admission: N/A  Microbiology results: 8/2 MRSA PCR negative  8/18 Sputum: sent   Thank you for allowing pharmacy to participate in this patient's care.  9/18, PharmD PGY-1 Acute Care Resident  05/15/2021 10:01 AM

## 2021-05-15 NOTE — Progress Notes (Signed)
Nutrition Follow-up  DOCUMENTATION CODES:   Not applicable  INTERVENTION:   Continue tube feeding via Cortrak: - Change to Jevity 1.2 @ 60 ml/hr (1440 ml/day) - ProSource TF 45 ml QID - Free water flushes of 100 ml q 8 hours, CCM to adjust as needed  Tube feeding regimen provides 1888 kcal, 124 grams of protein, and 1162 ml of H2O.  Total free water with flushes: 1462 ml  NUTRITION DIAGNOSIS:   Inadequate oral intake related to inability to eat as evidenced by NPO status.  Ongoing, being addressed via TF  GOAL:   Patient will meet greater than or equal to 90% of their needs  Met via TF  MONITOR:   Vent status, Labs, TF tolerance  REASON FOR ASSESSMENT:   Ventilator, Consult Enteral/tube feeding initiation and management  ASSESSMENT:   50 yo male admitted S/P PEA cardiac arrest at home. PMH includes tobacco abuse, emphysema, CVA, diabetes.  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 Pt with recurrent aspiration pneumonia. Pt started spiking fever and white count increasing. Per CCM, pt unable to tolerate SBT.  Discussed pt with RN and with CCM. Pt to have PEG placed once infection is controlled. Pt currently with TF being infused via post-pyloric Cortrak. Per MD, intolerance to gastric feeds initially observed was likely related to ileus which has now resolved.  Pt with rectal tube and multiple type 7 BMs. CCM requesting trial of a different tube feeding formula in an attempt to bulk stool. Nutrisource fiber already ordered.  Current TF: Vital AF 1.2 @ 65 ml/hr, free water flushes of 100 ml q 8 hours  Admit weight: 70 kg Current weight: 64.9 kg  Patient is currently on ventilator support via trach MV: 8.1 L/min Temp (24hrs), Avg:99.9 F (37.7 C), Min:98.5 F (36.9 C), Max:102.6 F (39.2 C)  Medications reviewed and include: nutrisource fiber BID, SSI q 4 hours,  protonix  Labs reviewed: BUN 35 CBG's: 107-135 x 24 hours  UOP: 700 ml x 24 hours Stool: 450 ml x 24 hours I/O's: +13.3 L since admit  Diet Order:   Diet Order     None       EDUCATION NEEDS:   Not appropriate for education at this time  Skin:  Skin Assessment: Reviewed RN Assessment  Last BM:  05/15/21 type 7 via rectal tube  Height:   Ht Readings from Last 1 Encounters:  04/29/21 5' 8"  (1.727 m)    Weight:   Wt Readings from Last 1 Encounters:  05/15/21 64.9 kg    Ideal Body Weight:  70 kg  BMI:  Body mass index is 21.76 kg/m.  Estimated Nutritional Needs:   Kcal:  1900-2100  Protein:  110-125 gm  Fluid:  >/= 1.9 L    Gustavus Bryant, MS, RD, LDN Inpatient Clinical Dietitian Please see AMiON for contact information.

## 2021-05-16 DIAGNOSIS — N179 Acute kidney failure, unspecified: Secondary | ICD-10-CM | POA: Diagnosis not present

## 2021-05-16 DIAGNOSIS — I469 Cardiac arrest, cause unspecified: Secondary | ICD-10-CM | POA: Diagnosis not present

## 2021-05-16 DIAGNOSIS — J9601 Acute respiratory failure with hypoxia: Secondary | ICD-10-CM | POA: Diagnosis not present

## 2021-05-16 LAB — GLUCOSE, CAPILLARY
Glucose-Capillary: 122 mg/dL — ABNORMAL HIGH (ref 70–99)
Glucose-Capillary: 143 mg/dL — ABNORMAL HIGH (ref 70–99)
Glucose-Capillary: 88 mg/dL (ref 70–99)
Glucose-Capillary: 126 mg/dL — ABNORMAL HIGH (ref 70–99)
Glucose-Capillary: 117 mg/dL — ABNORMAL HIGH (ref 70–99)
Glucose-Capillary: 148 mg/dL — ABNORMAL HIGH (ref 70–99)

## 2021-05-16 LAB — BASIC METABOLIC PANEL
Anion gap: 11 (ref 5–15)
BUN: 39 mg/dL — ABNORMAL HIGH (ref 6–20)
CO2: 25 mmol/L (ref 22–32)
Calcium: 9.5 mg/dL (ref 8.9–10.3)
Chloride: 104 mmol/L (ref 98–111)
Creatinine, Ser: 1.16 mg/dL (ref 0.61–1.24)
GFR, Estimated: >60 mL/min (ref >60)
Glucose, Bld: 130 mg/dL — ABNORMAL HIGH (ref 70–99)
Potassium: 4.4 mmol/L (ref 3.5–5.1)
Sodium: 140 mmol/L (ref 135–145)

## 2021-05-16 LAB — CBC
HCT: 31.9 % — ABNORMAL LOW (ref 39.0–52.0)
Hemoglobin: 10.5 g/dL — ABNORMAL LOW (ref 13.0–17.0)
MCH: 29.2 pg (ref 26.0–34.0)
MCHC: 32.9 g/dL (ref 30.0–36.0)
MCV: 88.9 fL (ref 80.0–100.0)
Platelets: 544 10*3/uL — ABNORMAL HIGH (ref 150–400)
RBC: 3.59 MIL/uL — ABNORMAL LOW (ref 4.22–5.81)
RDW: 14.7 % (ref 11.5–15.5)
WBC: 12.9 10*3/uL — ABNORMAL HIGH (ref 4.0–10.5)
nRBC: 0 % (ref 0.0–0.2)

## 2021-05-16 MED ORDER — MIDAZOLAM HCL 2 MG/2ML IJ SOLN
INTRAMUSCULAR | Status: AC
  Filled 2021-05-16: qty 2

## 2021-05-16 MED ORDER — MIDAZOLAM HCL 2 MG/2ML IJ SOLN
2.0000 mg | Freq: Once | INTRAMUSCULAR | Status: AC
  Administered 2021-05-16: 2 mg via INTRAVENOUS

## 2021-05-16 MED ORDER — SODIUM CHLORIDE 0.9 % IV SOLN
3.0000 g | Freq: Four times a day (QID) | INTRAVENOUS | Status: DC
  Administered 2021-05-16 – 2021-05-19 (×11): 3 g via INTRAVENOUS
  Filled 2021-05-16 (×11): qty 8

## 2021-05-16 NOTE — Progress Notes (Signed)
SLP Cancellation Note  Patient Details Name: Vincent [name] MRN: [id number] DOB: July 17, 1971   Cancelled session:       Reason Eval/Treat Not Completed: Patient not medically ready. SLP will follow along in chart for potential to participate.  D/W RN.  Chijioke Lasser L. Samson Frederic, MA CCC/SLP Acute Rehabilitation Services Office number [phone] Pager [phone]    Blenda Mounts Laurice 05/16/2021, 1:19 PM

## 2021-05-16 NOTE — Progress Notes (Signed)
NAME:  Vincent [name] Shawn [name], MRN:  [id number], DOB:  09/24/1971, LOS: 17 ADMISSION DATE:  04/29/2021, CONSULTATION DATE:  04/29/21 REFERRING MD:  Dr. Jacqulyn BathLong, CHIEF COMPLAINT:  Found Down   History of present illness   50 y/o M, admitted to ICU after PEA arrest, likely respiratory driven.  ED note reviewed.  History unobtainable for patient as he is intubated and sedated.  History per chart and sister at bedside.  Patient was not acting right, relatively unresponsive, EMS called.  Found to be agonal breathing.  Unclear initial pulse ox was.  Eventually lost pulse. CPR was initiated. 1 dose of epi.  About 6 minutes of CPR.  ROSC obtained. King airway placed. Transported to the ED.  King airway exchanged for ET tube. Noted to be hypotensive so norepinephrine started peripherally.  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  CTA PE protocol monitor patient reveals no PE, significant bilateral airspace disease was dense consolidations in the lower lobes, significant emphysema in upper lobes with interstitial thickening felt to be most consistent with pneumonitis/infection versus volume overload.  Moderate pericardial effusion noted by the radiologist.  Other findings as below.  He was given broad-spectrum antibiotics.  Initial lactate over 10.  1 L crystalloid given.  At time of evaluation he is on fentanyl drip and propofol.  Past Medical History  Tobacco Use Emphysema CVA DM  Significant Hospital Events   8/2: PEA Arrest, King airway in the field, CPR x6 minutes and epi x1, ROSC, Intubated in ED, Admitted to PCCM  8/4: Weaned off TTM, [place] O2 sat of 47%, started on milrinone and lasix 8/5 milrinone increased 8/6 TF stopped, OG placed to suction for ileus 8/8 Post pyloric Cortrak placed, myoclonic jerking, started Keppra, depakote 8/10 Completed antibiotic course for pneumonia. Family meeting transitioned to DNR. 8/15 Tracheostomy  8/18 Febrile overnight, started Zosyn for aspiration  pneumonia  Procedures:  LIJ: 8/3 d/ced on 8/12 Cortrak: 8/8  Significant Diagnostic Tests:   CT HEAD WO CONTRAST (5MM)   Result Date: 04/29/2021 CLINICAL DATA:  Mental status change EXAM: CT HEAD WITHOUT CONTRAST TECHNIQUE: Contiguous axial images were obtained from the base of the skull through the vertex without intravenous contrast. COMPARISON:  None. FINDINGS: Brain: No acute territorial infarction, hemorrhage or intracranial mass is visualized. Chronic right MCA infarct with extensive encephalomalacia involving the right frontal, parietal and temporal lobes as well as the right thalamus and basal ganglia. Moderate atrophy. Ex vacuo dilatation of right lateral ventricle. Atrophy of right brainstem. Vascular: No hyperdense vessels.  Carotid vascular calcification Skull: Normal. Negative for fracture or focal lesion. Sinuses/Orbits: Mucosal thickening in the sinuses. Chronic appearing deformity of the medial wall right orbit. Other: Incomplete fusion posterior arch of C1 IMPRESSION: 1. No definite CT evidence for acute intracranial abnormality. 2. Atrophy and chronic right MCA infarct. Electronically Signed   By: Jasmine PangKim  Fujinaga M.D.   On: 04/29/2021 22:09   CT Angio Chest PE W and/or Wo Contrast   Result Date: 04/29/2021 CLINICAL DATA:  PE suspected, high prob Post CPR. EXAM: CT ANGIOGRAPHY CHEST WITH CONTRAST TECHNIQUE: Multidetector CT imaging of the chest was performed using the standard protocol during bolus administration of intravenous contrast. Multiplanar CT image reconstructions and MIPs were obtained to evaluate the vascular anatomy. CONTRAST:  50mL OMNIPAQUE IOHEXOL 350 MG/ML SOLN COMPARISON:  Chest radiograph earlier today. FINDINGS: Cardiovascular: There are no filling defects within the pulmonary arteries to suggest pulmonary embolus. Mild aortic atherosclerosis. Cannot assess for dissection given  phase of contrast tailored to pulmonary arteries S1. Multi chamber cardiomegaly. Minimal  contrast refluxes into the hepatic veins and IVC. Moderate size circumferential pericardial effusion. This measures up to 17 mm in depth adjacent to the right ventricle. Mediastinum/Nodes: Shotty mediastinal adenopathy, including right anterior paratracheal node measuring 10 mm, series 5, image 37. Bilateral hilar lymph nodes measuring 9-10 mm. The esophagus is decompressed by enteric tube. No visualized thyroid nodule. Lungs/Pleura: The endotracheal tube tip is at the level of the carina, recommend retraction of 2-3 cm. Dense lower lobe consolidation, left greater than right, suspicious for aspiration. There additional patchy, ground-glass and confluent airspace opacities throughout both lungs. Mild smooth septal thickening. Underlying emphysema which is partially obscured by superimposed airspace disease. There is a 2.1 x 2.6 cm nodular density posteriorly in the left upper lobe abutting the pleura, series 6, image 26, partially obscured by adjacent airspace disease. Small bilateral pleural effusions, as well as fluid tracking into the right minor fissure. No pneumothorax. Upper Abdomen: No adrenal nodule. No acute upper abdominal findings. Probable scarring in the upper left kidney. Motion obscures evaluation of the upper abdomen. Musculoskeletal: No acute osseous abnormality. No anterior rib fractures typically seen with CPR. No focal bone lesion. Review of the MIP images confirms the above findings. IMPRESSION: 1. No pulmonary embolus. 2. Multi chamber cardiomegaly with moderate circumferential pericardial effusion. Minimal contrast refluxes into the hepatic veins and IVC consistent with elevated right heart pressures. 3. Dense lower lobe consolidation, left greater than right, suspicious for aspiration. Small bilateral pleural effusions. 4. There is set the thickening in ground-glass opacities suspicious for pulmonary edema. Superimposed airspace disease within there is a ground-glass opacity may represent  confluent edema or infection. 5. Shotty mediastinal and hilar adenopathy is likely reactive, but nonspecific. 6. Endotracheal tube tip is at the level of the carina, recommend retraction of 2-3 cm. 7. A 2.1 x 2.6 cm nodular density posteriorly in the left upper lobe abutting the pleura is nonspecific given the adjacent parenchymal findings, however recommend attention at follow-up to exclude the possibility of pulmonary mass. Aortic Atherosclerosis (ICD10-I70.0) and Emphysema (ICD10-J43.9). Electronically Signed   By: Narda Rutherford M.D.   On: 04/29/2021 22:02   DG Chest Portable 1 View   Result Date: 04/29/2021 CLINICAL DATA:  Post CPR.  Cardiac arrest EXAM: PORTABLE CHEST 1 VIEW COMPARISON:  None FINDINGS: Endotracheal tube terminates 2.2 cm above carina. External pacer/defibrillator is. Midline trachea. Mild cardiomegaly. No pleural effusion or pneumothorax. Interstitial and airspace disease is relatively diffuse but greater on the left than right. IMPRESSION: Appropriate position of endotracheal tube. Cardiomegaly with left greater than right interstitial and airspace disease. Favor asymmetric pulmonary edema. Given asymmetry, aspiration is possible but felt less likely. Electronically Signed   By: Jeronimo Greaves M.D.   On: 04/29/2021 20:53   EEG adult   Result Date: 04/30/2021 Rejeana Brock, MD     04/30/2021  2:15 AM History: 50 year old male status post cardiac arrest Sedation: Propofol Technique: This is a 21 channel routine scalp EEG performed at the bedside with bipolar and monopolar montages arranged in accordance to the international 10/20 system of electrode placement. One channel was dedicated to EKG recording. Background: The background is diffusely attenuated with ventilator artifact.  There is some degree of muscle artifact throughout most of the recording, but no definite background activity is seen.  He has several episodes of "shaking" without definite EEG change, other than  significant muscle artifact.  With one of these episodes  muscle artifact does significantly obscure the background, but to a degree I can tell there was no significant EEG change. Photic stimulation: Physiologic driving is not performed EEG Abnormalities: Diffusely attenuated background Clinical Interpretation: This EEG is severely abnormal with diffuse attenuation of the background.  There was no evidence of the muscle jerking seen was epileptiform in nature.  There was no seizure or seizure predisposition recorded on this study. Please note that lack of epileptiform activity on EEG does not preclude the possibility of epilepsy. Ritta Slot, MD [facility] [phone] If 7pm- 7am, please page neurology on call as listed in AMION.   Overnight EEG with video   Result Date: 04/30/2021 Charlsie Quest, MD     04/30/2021  9:03 AM Patient Name: Gerhard Rappaport MRN: [id number] Epilepsy Attending: Charlsie Quest Referring Physician/Provider: Rutherford Guys, PA Duration: 04/30/2021 0202 to 04/30/2021 0900 Patient history: 50 year old male status post cardiac arrest. EEG to evaluate for seizure Level of alertness:  comatose AEDs during EEG study: LEV, propofol Technical aspects: This EEG study was done with scalp electrodes positioned according to the 10-20 International system of electrode placement. Electrical activity was acquired at a sampling rate of  and reviewed with a high frequency filter of  and a low frequency filter of . EEG data were recorded continuously and digitally stored. Description: EEG showed continuous generalized background suppression.  EEG was not reactive to tactile stimulation.  Event button was pressed on 04/30/2021 at 0752 for tremors in arms and chest. Concomitant EEG before, during and after the event did not show any EEG changes suggest seizure. Hyperventilation and photic stimulation were not performed.   ABNORMALITY -Background suppression, generalized  IMPRESSION: This study is suggestive of profound diffuse encephalopathy, nonspecific to etiology.  However with a history of cardiac arrest this could be secondary to anoxic/hypoxic brain injury, sedation.  No seizures or epileptiform discharges were seen throughout the recording. Event button was pressed on 04/30/2021 at 0752 for tremors in arms and chest without concomitant EEG change. This was most likely not an epileptic event. Charlsie Quest        ECHOCARDIOGRAM REPORT Patient Name:   [facility] Date of Exam: 04/30/2021  Medical Rec #:  [id number]               Height:       68.0 in  Accession #:    [id number]              Weight:       154.5 lb  Date of Birth:  September 16, 1971                BSA:          1.832 m  Patient Age:    50 years                BP:           69/54 mmHg  Patient Gender: M                       HR:           110 bpm.  Exam Location:  Inpatient   Procedure: 2D Echo, Cardiac Doppler and Color Doppler   Indications:    Cardiac Arrest I46.9     History:        Patient has no prior history of Echocardiogram  examinations.     Sonographer:    Advertising copywriter  Referring Phys: [id number] [facility] P DESAI      Sonographer Comments: Echo performed with patient supine and on artificial  respirator. Reading Cardiologist notified.  IMPRESSIONS  1. Minor contractile sparing of the lateral wall. Left ventricular  ejection fraction, by estimation, is <20%. The left ventricle has severely  decreased function. The left ventricle demonstrates global hypokinesis.  Left ventricular diastolic parameters  are consistent with Grade III diastolic dysfunction (restrictive).   2. Right ventricular systolic function is normal. The right ventricular  size is normal. There is mildly elevated pulmonary artery systolic  pressure. The estimated right ventricular systolic pressure is 40.8 mmHg.   3. A small pericardial effusion is present. The pericardial effusion is  circumferential.  There is no evidence of cardiac tamponade.   4. The mitral valve is normal in structure. Trivial mitral valve  regurgitation. No evidence of mitral stenosis.   5. The aortic valve is normal in structure. Aortic valve regurgitation is  not visualized. No aortic stenosis is present.   6. The inferior vena cava is dilated in size with <50% respiratory  variability, suggesting right atrial pressure of 15 mmHg.   FINDINGS   Left Ventricle: Minor contractile sparing of the lateral wall. Left  ventricular ejection fraction, by estimation, is <20%. The left ventricle  has severely decreased function. The left ventricle demonstrates global  hypokinesis. The left ventricular  internal cavity size was normal in size. There is no left ventricular  hypertrophy. Left ventricular diastolic parameters are consistent with  Grade III diastolic dysfunction (restrictive).   Right Ventricle: The right ventricular size is normal. No increase in  right ventricular wall thickness. Right ventricular systolic function is  normal. There is mildly elevated pulmonary artery systolic pressure. The  tricuspid regurgitant velocity is 2.54   m/s, and with an assumed right atrial pressure of 15 mmHg, the estimated  right ventricular systolic pressure is 40.8 mmHg.   Left Atrium: Left atrial size was normal in size.   Right Atrium: Right atrial size was normal in size.   Pericardium: A small pericardial effusion is present. The pericardial  effusion is circumferential. There is no evidence of cardiac tamponade.   Mitral Valve: The mitral valve is normal in structure. Trivial mitral  valve regurgitation. No evidence of mitral valve stenosis.   Tricuspid Valve: The tricuspid valve is normal in structure. Tricuspid  valve regurgitation is not demonstrated. No evidence of tricuspid  stenosis.   Aortic Valve: The aortic valve is normal in structure. Aortic valve  regurgitation is not visualized. No aortic stenosis is  present.   Pulmonic Valve: The pulmonic valve was normal in structure. Pulmonic valve  regurgitation is trivial. No evidence of pulmonic stenosis.   Aorta: The aortic root is normal in size and structure.   Venous: The inferior vena cava is dilated in size with less than 50%  respiratory variability, suggesting right atrial pressure of 15 mmHg.   IAS/Shunts: No atrial level shunt detected by color flow Doppler.     Diastology  LV e' medial:   2.08 cm/s  LV E/e' medial: 28.9    RIGHT VENTRICLE            IVC  RV Basal diam:  3.30 cm    IVC diam: 2.30 cm  RV Mid diam:    2.70 cm  RV S prime:     5.06 cm/s  TAPSE (M-mode): 0.9 cm   LEFT ATRIUM  Index       RIGHT ATRIUM           Index  LA Vol (A2C):   50.8 ml 27.74 ml/m RA Area:     12.50 cm  LA Vol (A4C):   37.5 ml 20.47 ml/m RA Volume:   31.20 ml  17.03 ml/m  LA Biplane Vol: 44.6 ml 24.35 ml/m   AORTIC VALVE  LVOT Vmax:   30.30 cm/s  LVOT Vmean:  22.250 cm/s  LVOT VTI:    0.037 m     AORTA  Ao Asc diam: 2.70 cm   MITRAL VALVE               TRICUSPID VALVE  MV Area (PHT): 3.66 cm    TR Peak grad:   25.8 mmHg  MV Decel Time: 207 msec    TR Vmax:        254.00 cm/s  MV E velocity: 60.10 cm/s  MV A velocity: 41.70 cm/s  SHUNTS  MV E/A ratio:  1.44        Systemic VTI: 0.04 m No results found.   Micro Data:  MRSA swab: Negative Resp Panel: COVID, FLU negative Respiratory Culture: Collected  Antimicrobials:  Cefepime: 8/2>8/2 Vanc: 8/2>8/2 Azithromax: 8/2>>8/4 Ceftriaxone: 8/3>>>8/8 Augmentin: 8/8>>8/10 Zosyn: 8/18>8/19 Unasyn: 8/19>  Interim history/subjective:  O/N: No events  Non-communicative, trached.  Objective   Blood pressure 121/81, pulse 99, temperature (!) 100.9 F (38.3 C), temperature source Oral, resp. rate 16, height 5\' 8"  (1.727 m), weight 64.3 kg, SpO2 100 %.    Vent Mode: PRVC FiO2 (%):  [40 %] 40 % Set Rate:  [14 bmp] 14 bmp Vt Set:  [550 mL] 550 mL PEEP:  [8 cmH20] 8  cmH20 Plateau Pressure:  [19 cmH20-24 cmH20] 22 cmH20   Intake/Output Summary (Last 24 hours) at 05/16/2021 0718 Last data filed at 05/16/2021 0625 Gross per 24 hour  Intake 1864.81 ml  Output 1225 ml  Net 639.81 ml    Filed Weights   05/14/21 0500 05/15/21 0330 05/16/21 0423  Weight: 68.5 kg 64.9 kg 64.3 kg   Physical Exam Constitutional:      Appearance: He is ill-appearing and diaphoretic.     Comments: Ill appearing man, diaphoretic, non communicative.   Cardiovascular:     Rate and Rhythm: Normal rate and regular rhythm.     Pulses: Normal pulses.     Heart sounds: Normal heart sounds. No murmur heard.   No friction rub. No gallop.  Pulmonary:     Effort: Pulmonary effort is normal.  Skin:    Findings: No lesion or rash.  Neurological:     Comments: Occasionally opening eyes and tracking.      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 Family discussion 8/10 significant for continued care, but status status was changed to DNR. S/P Trach 8/15. CT abdomen shows anatomy favorable for PEG placement, given aspiration pneumonia, PEG placement will be delayed until he is further optimized.   - Continue Keppra IV or PO - Continue PRN Klonopin for myoclonus  Cardiogenic Shock PEA Cardiac Arrest Heart Failure with Reduced Ejection Fraction:  EF of 20-25%.  - Continue Lopressor to 50 mg TID  - Continue lisinopril 5  mg QD - Keep K>4.0 and magnesium >2.0. Replete as necessary - Trend BMP and Mg    Acute Hypoxemic Respiratory Failure Requiring MV:  Aspiration Pneumonia: S/P Trach placement on 8/15. Febrile overnight with a Tmax of 100.9 F , leukocytosis down trending to 12.9. Respiratory  cultures growing few staph epi, will DC Zosyn and start Unasyn.  - Discontinue Zosyn - Start Unasyn Day 2/7 - Trend fever curve and CBC - Continue mechanical ventilatory support - Continue daily SBT and SAT as tolerated. Continue PS trials as tolerated.  - VAP protocol   Diabetes Mellitus Type II: - Continue SSI   Lung Mass: 2.1x2.6cm nodular density posteriorly in the left upper lobe - Will need outpatient follow up if survives this hospitalization  Anemia Likely acute on chronic illness - Monitor with CBC  - Transfuse if Hgb<7  Best practice:  Diet/type: tubefeeds. PEG once medically optimized.  DVT prophylaxis: prophylactic heparin  GI prophylaxis: PPI Lines: none.  Foley: Condom Cath  Code Status: DNR Last date of multidisciplinary goals of care discussion 8/10 discussed with patient's sister and with Dr. Wilford Corner in neurology. Patient made DNR but to continue all aggressive care.  Family Communication:will update today Disposition: continues to require ICU.   Labs   CBC: Recent Labs  Lab 05/12/21 0111 05/13/21 0913 05/14/21 0027 05/15/21 0202 05/16/21 0209  WBC 12.2* 12.3* 11.8* 13.8* 12.9*  HGB 11.3* 10.9* 10.5* 10.3* 10.5*  HCT 35.1* 33.7* 33.1* 31.8* 31.9*  MCV 88.0 89.2 89.2 88.6 88.9  PLT 464* 473* 453* 453* 544*     Basic Metabolic Panel: Recent Labs  Lab 05/12/21 0111 05/13/21 0913 05/14/21 0027 05/15/21 0202 05/16/21 0209  NA 137 140 141 140 140  K 4.4 4.4 4.6 4.3 4.4  CL 104 104 107 105 104  CO2 25 26 23 24 25   GLUCOSE 118* 127* 128* 112* 130*  BUN 33* 34* 34* 35* 39*  CREATININE 1.23 1.35* 1.17 1.23 1.16  CALCIUM 8.9 9.1 9.4 9.6 9.5  MG  --  2.0  --  2.2  --     GFR: Estimated Creatinine Clearance: 68.5 mL/min (by C-G formula based on SCr of 1.16 mg/dL). Recent Labs  Lab 05/13/21 0913 05/14/21 0027 05/15/21 0202 05/16/21 0209  WBC 12.3* 11.8* 13.8* 12.9*     Liver Function Tests: No results for input(s): AST, ALT, ALKPHOS,  BILITOT, PROT, ALBUMIN in the last 168 hours.  No results for input(s): LIPASE, AMYLASE in the last 168 hours. No results for input(s): AMMONIA in the last 168 hours.  ABG    Component Value Date/Time   PHART 7.494 (H) 05/02/2021 1822   PCO2ART 30.6 (L) 05/02/2021 1822   PO2ART 95 05/02/2021 1822   HCO3 23.5 05/02/2021 1822   TCO2 24 05/02/2021 1822   ACIDBASEDEF 9.0 (H) 04/30/2021 0815   O2SAT 70.2 05/08/2021 0317   Coagulation Profile: No results for input(s): INR, PROTIME in the last 168 hours.   Cardiac Enzymes: No results for input(s): CKTOTAL, CKMB, CKMBINDEX, TROPONINI in the last 168 hours.  HbA1C: Hgb A1c MFr Bld  Date/Time Value Ref Range Status  04/30/2021 12:22 AM 5.8 (H) 4.8 - 5.6 % Final    Comment:    (NOTE) Pre diabetes:          5.7%-6.4%  Diabetes:              >6.4%  Glycemic control for   <7.0% adults with diabetes     CBG: Recent Labs  Lab 05/15/21 1121 05/15/21 1528 05/15/21 1946 05/15/21 2350 05/16/21 0345  GLUCAP 140* 130* 131* 119* 122*    Dolan Amen, MD IMTS, PGY-3 Pager: [phone] 05/16/2021,7:18 AM   If no response to pager , please call critical care on call (see AMION) until 7pm After 7:00 pm call Elink

## 2021-05-17 ENCOUNTER — Inpatient Hospital Stay (HOSPITAL_COMMUNITY): Payer: Medicaid Other

## 2021-05-17 DIAGNOSIS — I469 Cardiac arrest, cause unspecified: Secondary | ICD-10-CM | POA: Diagnosis not present

## 2021-05-17 DIAGNOSIS — J9601 Acute respiratory failure with hypoxia: Secondary | ICD-10-CM | POA: Diagnosis not present

## 2021-05-17 LAB — CBC
HCT: 32 % — ABNORMAL LOW (ref 39.0–52.0)
Hemoglobin: 10.2 g/dL — ABNORMAL LOW (ref 13.0–17.0)
MCH: 28.4 pg (ref 26.0–34.0)
MCHC: 31.9 g/dL (ref 30.0–36.0)
MCV: 89.1 fL (ref 80.0–100.0)
Platelets: 559 10*3/uL — ABNORMAL HIGH (ref 150–400)
RBC: 3.59 MIL/uL — ABNORMAL LOW (ref 4.22–5.81)
RDW: 14.6 % (ref 11.5–15.5)
WBC: 9.9 10*3/uL (ref 4.0–10.5)
nRBC: 0 % (ref 0.0–0.2)

## 2021-05-17 LAB — CULTURE, RESPIRATORY W GRAM STAIN

## 2021-05-17 LAB — BASIC METABOLIC PANEL
Anion gap: 10 (ref 5–15)
BUN: 36 mg/dL — ABNORMAL HIGH (ref 6–20)
CO2: 23 mmol/L (ref 22–32)
Calcium: 9 mg/dL (ref 8.9–10.3)
Chloride: 104 mmol/L (ref 98–111)
Creatinine, Ser: 1.04 mg/dL (ref 0.61–1.24)
GFR, Estimated: >60 mL/min (ref >60)
Glucose, Bld: 123 mg/dL — ABNORMAL HIGH (ref 70–99)
Potassium: 4 mmol/L (ref 3.5–5.1)
Sodium: 137 mmol/L (ref 135–145)

## 2021-05-17 LAB — MAGNESIUM: Magnesium: 2.3 mg/dL (ref 1.7–2.4)

## 2021-05-17 LAB — GLUCOSE, CAPILLARY
Glucose-Capillary: 120 mg/dL — ABNORMAL HIGH (ref 70–99)
Glucose-Capillary: 138 mg/dL — ABNORMAL HIGH (ref 70–99)
Glucose-Capillary: 105 mg/dL — ABNORMAL HIGH (ref 70–99)
Glucose-Capillary: 129 mg/dL — ABNORMAL HIGH (ref 70–99)
Glucose-Capillary: 107 mg/dL — ABNORMAL HIGH (ref 70–99)
Glucose-Capillary: 105 mg/dL — ABNORMAL HIGH (ref 70–99)

## 2021-05-17 MED ORDER — ATORVASTATIN CALCIUM 40 MG PO TABS: 40.0000 mg | ORAL_TABLET | Freq: Every day | ORAL | Status: DC

## 2021-05-17 MED ORDER — ATORVASTATIN CALCIUM 40 MG PO TABS
40.0000 mg | ORAL_TABLET | Freq: Every day | ORAL | Status: DC
  Administered 2021-05-17 – 2021-07-21 (×60): 40 mg
  Filled 2021-05-17 (×62): qty 1

## 2021-05-17 MED ORDER — FUROSEMIDE 10 MG/ML IJ SOLN
40.0000 mg | Freq: Once | INTRAMUSCULAR | Status: AC
  Administered 2021-05-17: 40 mg via INTRAVENOUS
  Filled 2021-05-17: qty 4

## 2021-05-17 NOTE — Progress Notes (Signed)
RT NOTE: patient placed back on full support ventilation due to increased RR.  Tolerating well at this time.  Will continue to monitor.

## 2021-05-17 NOTE — Progress Notes (Addendum)
NAME:  Vincent [name], MRN:  [id number], DOB:  06-19-1971, LOS: 18 ADMISSION DATE:  04/29/2021, CONSULTATION DATE:  04/29/21 REFERRING MD:  Dr. Jacqulyn Bath, CHIEF COMPLAINT:  Found Down   History of present illness   50 yo male smoker admitted with PEA cardiac arrest with ROSC after 6 minutes.  CT head showed old infarct.  CT chest showed b/l dense lower lobe consolidation with changes of emphysema.  UDS positive for benzodiazepines and THC.  Past Medical History  Emphysema, CVA, DM type 2  Significant Hospital Events   8/2: PEA Arrest, [place] airway in the field, CPR x6 minutes and epi x1, ROSC, Intubated in ED, Admitted to PCCM  8/4: Weaned off TTM, Coox O2 sat of 47%, started on milrinone and lasix 8/5 milrinone increased 8/6 TF stopped, OG placed to suction for ileus 8/8 Post pyloric Cortrak placed, myoclonic jerking, started Keppra, depakote 8/10 Completed antibiotic course for pneumonia. Family meeting transitioned to DNR. 8/15 Tracheostomy  8/18 Febrile overnight, started Zosyn for aspiration pneumonia  Interim history/subjective:  Low Vt, increased RR with SBT.  Objective   Blood pressure 115/78, pulse 87, temperature 99.6 F (37.6 C), temperature source Oral, resp. rate (!) 25, height 5\' 8"  (1.727 m), weight 65.6 kg, SpO2 100 %.    Vent Mode: PRVC FiO2 (%):  [40 %-50 %] 40 % Set Rate:  [14 bmp] 14 bmp Vt Set:  [550 mL] 550 mL PEEP:  [8 cmH20] 8 cmH20 Pressure Support:  [10 cmH20] 10 cmH20 Plateau Pressure:  [20 cmH20-22 cmH20] 22 cmH20   Intake/Output Summary (Last 24 hours) at 05/17/2021 05/19/2021 Last data filed at 05/17/2021 0800 Gross per 24 hour  Intake 2296.99 ml  Output 975 ml  Net 1321.99 ml    Filed Weights   05/15/21 0330 05/16/21 0423 05/17/21 0500  Weight: 64.9 kg 64.3 kg 65.6 kg    General - on vent Eyes - pupils reactive ENT - trach site clean Cardiac - regular rate/rhythm, no murmur Chest - scattered rhonchi Abdomen - soft, non tender, + bowel  sounds Extremities - decreased muscle bulk Skin - no rashes Neuro - opens eyes with stimulation, not following commands  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 - continue keppra  Acute systolic/diastolic CHF after cardiac arrest. - continue lopressor, lisinopril - lasix 40 mg IV x one on 8/20  Acute hypoxic respiratory failure from cardiac arrest and Aspiration pneumonia. Failure to wean from ventilator s/p tracheostomy. Centrilobular emphysema. - wean on pressure support as able - trach care - d/c trach sutures after 8/22 - f/u CXR intermittently - prn duoneb - day 2 of Abx, currently on unasyn  2.6 x 2.1 cm nodular density in Lt upper lobe abutting pleura. - seen on CT chest 04/29/21 - will need repeat CT imaging in September  DM type 2 poorly controlled with hyperglycemia. - SSI  Anemia of critical illness. - f/u CBC - transfuse for Hb < 7  Dysphagia. - will need to arrange for PEG placement  Best practice:  Diet/type: tube feeds DVT prophylaxis: SQ heparin GI prophylaxis: protonix Code Status: DNR Disposition: ICU  Labs    CMP Latest Ref Rng & Units 05/17/2021 05/16/2021 05/15/2021  Glucose 70 - 99 mg/dL 05/17/2021) 353(I) 144(R)  BUN 6 - 20 mg/dL 154(M) 08(Q) 76(P)  Creatinine 0.61 - 1.24 mg/dL 0.16 5.53 7.48  Sodium 135 - 145 mmol/L 137 140 140  Potassium 3.5 - 5.1 mmol/L 4.0 4.4 4.3  Chloride 98 - 111 mmol/L 104 104 105  CO2 22 - 32 mmol/L 23 25 24   Calcium 8.9 - 10.3 mg/dL 9.0 9.5 9.6  Total Protein 6.5 - 8.1 g/dL - - -  Total Bilirubin 0.3 - 1.2 mg/dL - - -  Alkaline Phos 38 - 126 U/L - - -  AST 15 - 41 U/L - - -  ALT 0 - 44 U/L - - -    CBC Latest Ref Rng & Units 05/17/2021 05/16/2021 05/15/2021  WBC 4.0 - 10.5 K/uL 9.9 12.9(H) 13.8(H)  Hemoglobin 13.0 -  17.0 g/dL 10.2(L) 10.5(L) 10.3(L)  Hematocrit 39.0 - 52.0 % 32.0(L) 31.9(L) 31.8(L)  Platelets 150 - 400 K/uL 559(H) 544(H) 453(H)    ABG    Component Value Date/Time   PHART 7.494 (H) 05/02/2021 1822   PCO2ART 30.6 (L) 05/02/2021 1822   PO2ART 95 05/02/2021 1822   HCO3 23.5 05/02/2021 1822   TCO2 24 05/02/2021 1822   ACIDBASEDEF 9.0 (H) 04/30/2021 0815   O2SAT 70.2 05/08/2021 0317    CBG (last 3)  Recent Labs    05/16/21 2323 05/17/21 0327 05/17/21 0755  GLUCAP 148* 120* 138*    Critical care time 33 minutes  05/19/21, MD [place] [facility]/Critical Care Pager - [phone] 05/17/2021, 9:18 AM

## 2021-05-17 NOTE — Progress Notes (Signed)
RT NOTE: patient placed on CPAP/PSV of 10/8 at 0800.  Currently tolerating well.  Will continue to monitor.

## 2021-05-17 NOTE — Progress Notes (Signed)
eLink Physician-Brief Progress Note Patient Name: Vincent [name] DOB: 11-05-1970 MRN: [id number]   Date of Service  05/17/2021  HPI/Events of Note  Nursing reports new onset of posturing.   eICU Interventions  Plan> Head CT Scan w/o contrast now.      Intervention Category Major Interventions: Change in mental status - evaluation and management  Evany Schecter [place] 05/17/2021, 3:06 AM

## 2021-05-18 DIAGNOSIS — J9601 Acute respiratory failure with hypoxia: Secondary | ICD-10-CM | POA: Diagnosis not present

## 2021-05-18 LAB — BASIC METABOLIC PANEL
Anion gap: 12 (ref 5–15)
BUN: 39 mg/dL — ABNORMAL HIGH (ref 6–20)
CO2: 24 mmol/L (ref 22–32)
Calcium: 9.7 mg/dL (ref 8.9–10.3)
Chloride: 107 mmol/L (ref 98–111)
Creatinine, Ser: 1.02 mg/dL (ref 0.61–1.24)
GFR, Estimated: >60 mL/min (ref >60)
Glucose, Bld: 138 mg/dL — ABNORMAL HIGH (ref 70–99)
Potassium: 4.4 mmol/L (ref 3.5–5.1)
Sodium: 143 mmol/L (ref 135–145)

## 2021-05-18 LAB — GLUCOSE, CAPILLARY
Glucose-Capillary: 104 mg/dL — ABNORMAL HIGH (ref 70–99)
Glucose-Capillary: 111 mg/dL — ABNORMAL HIGH (ref 70–99)
Glucose-Capillary: 115 mg/dL — ABNORMAL HIGH (ref 70–99)
Glucose-Capillary: 117 mg/dL — ABNORMAL HIGH (ref 70–99)
Glucose-Capillary: 124 mg/dL — ABNORMAL HIGH (ref 70–99)
Glucose-Capillary: 126 mg/dL — ABNORMAL HIGH (ref 70–99)

## 2021-05-18 MED ORDER — CLONAZEPAM 1 MG PO TABS
2.0000 mg | ORAL_TABLET | Freq: Three times a day (TID) | ORAL | Status: DC | PRN
  Administered 2021-05-18 – 2021-05-22 (×6): 2 mg
  Filled 2021-05-18 (×6): qty 2

## 2021-05-18 NOTE — Progress Notes (Signed)
NAME:  Vincent [name], MRN:  [id number], DOB:  12/11/1970, LOS: 19 ADMISSION DATE:  04/29/2021, CONSULTATION DATE:  04/29/21 REFERRING MD:  Dr. Jacqulyn Bath, CHIEF COMPLAINT:  Found Down   History of present illness   50 yo male smoker admitted with PEA cardiac arrest with ROSC after 6 minutes.  CT head showed old infarct.  CT chest showed b/l dense lower lobe consolidation with changes of emphysema.  UDS positive for benzodiazepines and THC.  Past Medical History  Emphysema, CVA, DM type 2  Significant Hospital Events   8/2: PEA Arrest, [facility] airway in the field, CPR x6 minutes and epi x1, ROSC, Intubated in ED, Admitted to PCCM  8/4: Weaned off TTM, Coox O2 sat of 47%, started on milrinone and lasix 8/5 milrinone increased 8/6 TF stopped, OG placed to suction for ileus 8/8 Post pyloric Cortrak placed, myoclonic jerking, started Keppra, depakote 8/10 Completed antibiotic course for pneumonia. Family meeting transitioned to DNR. 8/15 Tracheostomy  8/18 Febrile overnight, started Zosyn for aspiration pneumonia  Interim history/subjective:  Remains on full vent support.  Objective   Blood pressure 125/88, pulse 89, temperature 98.8 F (37.1 C), temperature source Oral, resp. rate (!) 29, height 5\' 8"  (1.727 m), weight 64.2 kg, SpO2 100 %.    Vent Mode: PSV;CPAP FiO2 (%):  [40 %] 40 % Set Rate:  [14 bmp] 14 bmp Vt Set:  [550 mL] 550 mL PEEP:  [8 cmH20] 8 cmH20 Pressure Support:  [12 cmH20] 12 cmH20 Plateau Pressure:  [18 cmH20-19 cmH20] 18 cmH20   Intake/Output Summary (Last 24 hours) at 05/18/2021 0912 Last data filed at 05/18/2021 0800 Gross per 24 hour  Intake 2319.8 ml  Output 2225 ml  Net 94.8 ml   Filed Weights   05/16/21 0423 05/17/21 0500 05/18/21 0500  Weight: 64.3 kg 65.6 kg 64.2 kg    General - on vent Eyes - pupils reactive ENT - NG tube in place, trach site clean Cardiac - regular rate/rhythm, no murmur Chest - equal breath sounds b/l, no wheezing or  rales Abdomen - soft, non tender, + bowel sounds Extremities - no cyanosis, clubbing, or edema Skin - no rashes Neuro - opens eyes with stimulation, not following commands  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 - continue keppra  Acute systolic/diastolic CHF after cardiac arrest. - continue lopressor, lisinopril - even to negative fluid balance as tolerated  Acute hypoxic respiratory failure from cardiac arrest and Aspiration pneumonia. Failure to wean from ventilator s/p tracheostomy. Centrilobular emphysema. - pressure support wean as tolerated - trach care - d/c trach sutures after 8/22 - f/u CXR intermittently - prn duoneb - day 3 of abx, currently on unasyn  2.6 x 2.1 cm nodular density in Lt upper lobe abutting pleura. - seen on CT chest 04/29/21 - will need repeat CT imaging in September  DM type 2 poorly controlled with hyperglycemia. - SSI  Anemia of critical illness. - f/u CBC intermittently - transfuse for Hb < 7  Dysphagia. - IR consulted for G tube placement  Disposition. - CM/SW looking into LTACH options, but insurance coverage seems to be limiting factor  Best practice:  Diet/type: tube feeds DVT prophylaxis: SQ heparin GI prophylaxis: protonix Code Status: DNR Disposition: ICU  Labs    CMP Latest Ref Rng &  Units 05/18/2021 05/17/2021 05/16/2021  Glucose 70 - 99 mg/dL 712(W) 580(D) 983(J)  BUN 6 - 20 mg/dL 82(N) 05(L) 97(Q)  Creatinine 0.61 - 1.24 mg/dL 7.34 1.93 7.90  Sodium 135 - 145 mmol/L 143 137 140  Potassium 3.5 - 5.1 mmol/L 4.4 4.0 4.4  Chloride 98 - 111 mmol/L 107 104 104  CO2 22 - 32 mmol/L 24 23 25   Calcium 8.9 - 10.3 mg/dL 9.7 9.0 9.5  Total Protein 6.5 - 8.1 g/dL - - -  Total Bilirubin 0.3 - 1.2 mg/dL - - -  Alkaline Phos 38 -  126 U/L - - -  AST 15 - 41 U/L - - -  ALT 0 - 44 U/L - - -    CBC Latest Ref Rng & Units 05/17/2021 05/16/2021 05/15/2021  WBC 4.0 - 10.5 K/uL 9.9 12.9(H) 13.8(H)  Hemoglobin 13.0 - 17.0 g/dL 10.2(L) 10.5(L) 10.3(L)  Hematocrit 39.0 - 52.0 % 32.0(L) 31.9(L) 31.8(L)  Platelets 150 - 400 K/uL 559(H) 544(H) 453(H)    ABG    Component Value Date/Time   PHART 7.494 (H) 05/02/2021 1822   PCO2ART 30.6 (L) 05/02/2021 1822   PO2ART 95 05/02/2021 1822   HCO3 23.5 05/02/2021 1822   TCO2 24 05/02/2021 1822   ACIDBASEDEF 9.0 (H) 04/30/2021 0815   O2SAT 70.2 05/08/2021 0317    CBG (last 3)  Recent Labs    05/17/21 2325 05/18/21 0322 05/18/21 0735  GLUCAP 105* 124* 126*    Signature:  05/20/21, MD [facility] Pulmonary/Critical Care Pager - [phone] 05/18/2021, 9:12 AM

## 2021-05-18 NOTE — Progress Notes (Signed)
Pharmacy Antibiotic Note  Vincent [name] is a 50 y.o. male admitted on 04/29/2021 with sepsis for which the patient completed a full course of abx. Pt is currently requiring full ventilatory support.  Patient's WBC have remained elevated since 8/11. 8/18 WBC increased to 13.8 and patient is febrile (Tm 102.6) with concerns for possible aspiration PNA/VAP. Pharmacy has been consulted to switch from zosyn to Unasyn dosing.  Today is day 4 of antibiotics for asp pneumonia. WBC 9.9 and afebrile  Plan: Continue Unasyn 3 g IV Q6H  -Monitor renal function, clinical status, and antibiotic plan  Height: 5\' 8"  (172.7 cm) Weight: 64.2 kg (141 lb 8.6 oz) IBW/kg (Calculated) : 68.4  Temp (24hrs), Avg:98.8 F (37.1 C), Min:98.6 F (37 C), Max:99.1 F (37.3 C)  Recent Labs  Lab 05/13/21 0913 05/14/21 0027 05/15/21 0202 05/16/21 0209 05/17/21 0036 05/18/21 0332  WBC 12.3* 11.8* 13.8* 12.9* 9.9  --   CREATININE 1.35* 1.17 1.23 1.16 1.04 1.02     Estimated Creatinine Clearance: 77.8 mL/min (by C-G formula based on SCr of 1.02 mg/dL).    No Known Allergies  Antimicrobials this admission: Cefepime 8/3 x1   Vanc 8/3 x1 Azithromycin 8/3 x1  Ceftriaxone 8/3>>8/7  Augmentin 8/8>>8/9  Zosyn 8/18>> 8/19 Unasyn 8/19>>  Dose adjustments this admission: N/A  Microbiology results: 8/2 MRSA PCR negative  8/18 Sputum: staph epi - likely contaminant  Thank you for allowing pharmacy to participate in this patient's care.  9/18, PharmD, [facility] Clinical Pharmacist Please see AMION for all Pharmacists' Contact Phone Numbers 05/18/2021, 10:09 AM

## 2021-05-18 NOTE — Progress Notes (Signed)
RT NOTE: patient placed on CPAP/PSV of 12/8 at 0800.  Will continue to monitor.

## 2021-05-19 DIAGNOSIS — J9601 Acute respiratory failure with hypoxia: Secondary | ICD-10-CM | POA: Diagnosis not present

## 2021-05-19 DIAGNOSIS — I469 Cardiac arrest, cause unspecified: Secondary | ICD-10-CM | POA: Diagnosis not present

## 2021-05-19 LAB — CBC WITH DIFFERENTIAL/PLATELET
Abs Immature Granulocytes: 0.07 10*3/uL (ref 0.00–0.07)
Basophils Absolute: 0.1 10*3/uL (ref 0.0–0.1)
Basophils Relative: 1 %
Eosinophils Absolute: 0.4 10*3/uL (ref 0.0–0.5)
Eosinophils Relative: 3 %
HCT: 34.7 % — ABNORMAL LOW (ref 39.0–52.0)
Hemoglobin: 10.9 g/dL — ABNORMAL LOW (ref 13.0–17.0)
Immature Granulocytes: 1 %
Lymphocytes Relative: 32 %
Lymphs Abs: 3.3 10*3/uL (ref 0.7–4.0)
MCH: 28.3 pg (ref 26.0–34.0)
MCHC: 31.4 g/dL (ref 30.0–36.0)
MCV: 90.1 fL (ref 80.0–100.0)
Monocytes Absolute: 0.7 10*3/uL (ref 0.1–1.0)
Monocytes Relative: 7 %
Neutro Abs: 5.7 10*3/uL (ref 1.7–7.7)
Neutrophils Relative %: 56 %
Platelets: 644 10*3/uL — ABNORMAL HIGH (ref 150–400)
RBC: 3.85 MIL/uL — ABNORMAL LOW (ref 4.22–5.81)
RDW: 14.1 % (ref 11.5–15.5)
WBC: 10.2 10*3/uL (ref 4.0–10.5)
nRBC: 0 % (ref 0.0–0.2)

## 2021-05-19 LAB — GLUCOSE, CAPILLARY
Glucose-Capillary: 109 mg/dL — ABNORMAL HIGH (ref 70–99)
Glucose-Capillary: 114 mg/dL — ABNORMAL HIGH (ref 70–99)
Glucose-Capillary: 116 mg/dL — ABNORMAL HIGH (ref 70–99)
Glucose-Capillary: 116 mg/dL — ABNORMAL HIGH (ref 70–99)
Glucose-Capillary: 119 mg/dL — ABNORMAL HIGH (ref 70–99)
Glucose-Capillary: 137 mg/dL — ABNORMAL HIGH (ref 70–99)

## 2021-05-19 LAB — BASIC METABOLIC PANEL
Anion gap: 10 (ref 5–15)
BUN: 33 mg/dL — ABNORMAL HIGH (ref 6–20)
CO2: 24 mmol/L (ref 22–32)
Calcium: 9.5 mg/dL (ref 8.9–10.3)
Chloride: 107 mmol/L (ref 98–111)
Creatinine, Ser: 0.95 mg/dL (ref 0.61–1.24)
GFR, Estimated: >60 mL/min (ref >60)
Glucose, Bld: 129 mg/dL — ABNORMAL HIGH (ref 70–99)
Potassium: 4.5 mmol/L (ref 3.5–5.1)
Sodium: 141 mmol/L (ref 135–145)

## 2021-05-19 LAB — MAGNESIUM: Magnesium: 2.2 mg/dL (ref 1.7–2.4)

## 2021-05-19 NOTE — Progress Notes (Signed)
RT NOTE: Patient placed back on full support settings due to increased RR.  Tolerating well.  Will continue to monitor.

## 2021-05-19 NOTE — Progress Notes (Signed)
NAME:  Vincent [name], MRN:  [id number], DOB:  [birth year], LOS: 20 ADMISSION DATE:  04/29/2021, CONSULTATION DATE:  04/29/21 REFERRING MD:  Dr. Jacqulyn Bath, CHIEF COMPLAINT:  Found Down   History of present illness   50 yo male smoker admitted with PEA cardiac arrest with ROSC after 6 minutes.  CT head showed old infarct.  CT chest showed b/l dense lower lobe consolidation with changes of emphysema.  UDS positive for benzodiazepines and THC.  Past Medical History  Emphysema, CVA, DM type 2  Significant Hospital Events   8/2: PEA Arrest, King airway in the field, CPR x6 minutes and epi x1, ROSC, Intubated in ED, Admitted to PCCM  8/4: Weaned off TTM, Coox O2 sat of 47%, started on milrinone and lasix 8/5 milrinone increased 8/6 TF stopped, OG placed to suction for ileus 8/8 Post pyloric Cortrak placed, myoclonic jerking, started Keppra, depakote 8/10 Completed antibiotic course for pneumonia. Family meeting transitioned to DNR. 8/15 Tracheostomy  8/18 Febrile overnight, started Zosyn for aspiration pneumonia 8/22 Completed Abx course for aspiration pneumonia   Interim history/subjective:  Remains on full vent support.  Objective   Blood pressure (!) 134/102, pulse 99, temperature 98.9 F (37.2 C), temperature source Axillary, resp. rate 15, height 5\' 8"  (1.727 m), weight 64.7 kg, SpO2 100 %.    Vent Mode: PRVC FiO2 (%):  [40 %] 40 % Set Rate:  [14 bmp] 14 bmp Vt Set:  [550 mL] 550 mL PEEP:  [8 cmH20] 8 cmH20 Plateau Pressure:  [18 cmH20-23 cmH20] 23 cmH20   Intake/Output Summary (Last 24 hours) at 05/19/2021 0805 Last data filed at 05/18/2021 1800 Gross per 24 hour  Intake 826.55 ml  Output 450 ml  Net 376.55 ml   Filed Weights   05/17/21 0500 05/18/21 0500 05/19/21 0500  Weight: 65.6 kg 64.2 kg 64.7 kg    Physical Exam Constitutional:      Appearance: He is not diaphoretic.     Comments: Ill appearing, NAD  Eyes:     General:        Right eye: No discharge.         Left eye: No discharge.     Conjunctiva/sclera: Conjunctivae normal.     Pupils: Pupils are equal, round, and reactive to light.  Cardiovascular:     Rate and Rhythm: Normal rate and regular rhythm.     Pulses: Normal pulses.     Heart sounds: Normal heart sounds. No murmur heard.   No friction rub. No gallop.  Pulmonary:     Breath sounds: No wheezing or rales.     Comments: Trach in place, coarse breath sounds appreciated bilaterally, on PS Abdominal:     General: Abdomen is flat. Bowel sounds are normal. There is no distension.     Palpations: Abdomen is soft.     Tenderness: There is no abdominal tenderness.  Musculoskeletal:     Right lower leg: No edema.     Left lower leg: No edema.     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 - Continue Keppra - Klonopin PRN  Acute systolic/diastolic CHF after cardiac arrest. - Continue lopressor, lisinopril - even to negative fluid balance as tolerated  Acute hypoxic respiratory failure from cardiac arrest and Aspiration pneumonia. Failure to wean from ventilator s/p tracheostomy.  Centrilobular emphysema. Completes Full course of Abx for Aspiration Pneumonia 8/22. Has been afebrile through the weekend and leukocytosis has resolved. Will reach out to IR to reassess PEG tube.   - pressure support wean as tolerated - trach care - DC Trach Sutures 8/23 - PRN Duoneb - VAP protocol  2.6 x 2.1 cm nodular density in Lt upper lobe abutting pleura. - seen on CT chest 04/29/21 - will need repeat CT imaging in September  DM type 2 poorly controlled with hyperglycemia. - SSI  Anemia of critical illness. - f/u CBC intermittently - transfuse for Hb < 7  Dysphagia. - IR consulted for G tube placement  Disposition. - CM/SW looking into  LTACH options, but insurance coverage seems to be limiting factor  Best practice:  Diet/type: tube feeds DVT prophylaxis: SQ heparin GI prophylaxis: protonix Code Status: DNR Disposition: ICU  Labs    CMP Latest Ref Rng & Units 05/18/2021 05/17/2021 05/16/2021  Glucose 70 - 99 mg/dL 106(Y) 694(W) 546(E)  BUN 6 - 20 mg/dL 70(J) 50(K) 93(G)  Creatinine 0.61 - 1.24 mg/dL 1.82 9.93 7.16  Sodium 135 - 145 mmol/L 143 137 140  Potassium 3.5 - 5.1 mmol/L 4.4 4.0 4.4  Chloride 98 - 111 mmol/L 107 104 104  CO2 22 - 32 mmol/L 24 23 25   Calcium 8.9 - 10.3 mg/dL 9.7 9.0 9.5  Total Protein 6.5 - 8.1 g/dL - - -  Total Bilirubin 0.3 - 1.2 mg/dL - - -  Alkaline Phos 38 - 126 U/L - - -  AST 15 - 41 U/L - - -  ALT 0 - 44 U/L - - -    CBC Latest Ref Rng & Units 05/17/2021 05/16/2021 05/15/2021  WBC 4.0 - 10.5 K/uL 9.9 12.9(H) 13.8(H)  Hemoglobin 13.0 - 17.0 g/dL 10.2(L) 10.5(L) 10.3(L)  Hematocrit 39.0 - 52.0 % 32.0(L) 31.9(L) 31.8(L)  Platelets 150 - 400 K/uL 559(H) 544(H) 453(H)    ABG    Component Value Date/Time   PHART 7.494 (H) 05/02/2021 1822   PCO2ART 30.6 (L) 05/02/2021 1822   PO2ART 95 05/02/2021 1822   HCO3 23.5 05/02/2021 1822   TCO2 24 05/02/2021 1822   ACIDBASEDEF 9.0 (H) 04/30/2021 0815   O2SAT 70.2 05/08/2021 0317    CBG (last 3)  Recent Labs    05/18/21 2337 05/19/21 0326 05/19/21 0748  GLUCAP 115* 116* 119*    05/21/21, MD [facility], PGY-3 Pager: [phone] 05/19/2021,8:06 AM

## 2021-05-19 NOTE — Progress Notes (Signed)
RT NOTE: patient placed on CPAP/PSV of 12/8 at 0825.  Tolerating well at this time.  Will continue to monitor.

## 2021-05-20 DIAGNOSIS — J9601 Acute respiratory failure with hypoxia: Secondary | ICD-10-CM | POA: Diagnosis not present

## 2021-05-20 DIAGNOSIS — I469 Cardiac arrest, cause unspecified: Secondary | ICD-10-CM | POA: Diagnosis not present

## 2021-05-20 LAB — GLUCOSE, CAPILLARY
Glucose-Capillary: 122 mg/dL — ABNORMAL HIGH (ref 70–99)
Glucose-Capillary: 116 mg/dL — ABNORMAL HIGH (ref 70–99)
Glucose-Capillary: 121 mg/dL — ABNORMAL HIGH (ref 70–99)
Glucose-Capillary: 111 mg/dL — ABNORMAL HIGH (ref 70–99)
Glucose-Capillary: 112 mg/dL — ABNORMAL HIGH (ref 70–99)
Glucose-Capillary: 101 mg/dL — ABNORMAL HIGH (ref 70–99)

## 2021-05-20 MED ORDER — LISINOPRIL 10 MG PO TABS
5.0000 mg | ORAL_TABLET | Freq: Every day | ORAL | Status: DC
  Administered 2021-05-21 – 2021-05-26 (×6): 5 mg
  Filled 2021-05-20 (×7): qty 1

## 2021-05-20 MED ORDER — DOCUSATE SODIUM 50 MG/5ML PO LIQD
100.0000 mg | Freq: Two times a day (BID) | ORAL | Status: DC
  Administered 2021-05-20: 100 mg
  Filled 2021-05-20: qty 10

## 2021-05-20 MED ORDER — FENTANYL CITRATE (PF) 100 MCG/2ML IJ SOLN
25.0000 ug | Freq: Once | INTRAMUSCULAR | Status: AC
Start: 2021-05-20 — End: 2021-05-20
  Administered 2021-05-20: 25 ug via INTRAVENOUS

## 2021-05-20 MED ORDER — DOCUSATE SODIUM 50 MG/5ML PO LIQD
100.0000 mg | Freq: Two times a day (BID) | ORAL | Status: DC
  Administered 2021-05-20 – 2021-06-01 (×22): 100 mg
  Filled 2021-05-20 (×22): qty 10

## 2021-05-20 MED ORDER — INSULIN ASPART 100 UNIT/ML IJ SOLN
0.0000 [IU] | INTRAMUSCULAR | Status: DC
  Administered 2021-05-20: 1 [IU] via SUBCUTANEOUS

## 2021-05-20 MED ORDER — CEFAZOLIN SODIUM-DEXTROSE 2-4 GM/100ML-% IV SOLN
2.0000 g | INTRAVENOUS | Status: AC
  Administered 2021-05-21: 2 g via INTRAVENOUS
  Filled 2021-05-20: qty 100

## 2021-05-20 MED ORDER — FENTANYL CITRATE (PF) 100 MCG/2ML IJ SOLN
INTRAMUSCULAR | Status: AC
  Filled 2021-05-20: qty 2

## 2021-05-20 MED ORDER — FENTANYL CITRATE (PF) 100 MCG/2ML IJ SOLN
25.0000 ug | INTRAMUSCULAR | Status: DC | PRN
Start: 2021-05-20 — End: 2021-05-21
  Administered 2021-05-20 – 2021-05-21 (×2): 25 ug via INTRAVENOUS
  Filled 2021-05-20 (×2): qty 2

## 2021-05-20 NOTE — TOC Progression Note (Addendum)
Transition of Care [facility]) - Progression Note    Patient Details  Name: Vincent [name] MRN: [id number] Date of Birth: [birth year]  [facility]) CM/SW Contact  Ralene Bathe, LCSWA Phone Number: 05/20/2021, 10:37 AM  Clinical Narrative:    CSW spoke with the patient's sister [name] and inquired about the family's wishes as it relates to discharge planning for the patient.  CSW informed Ms. Little about LTAC facilities not accepting the patient's insurance and gave information about the 3 Vent SNF's in [place].  Ms. Clarene Duke was open to CSW contacting all of the facilities in an attempt to have the patient transferred to his next level of care.    CSW called Prem with [facility] to inquire about the facilities ability to accept patients in the Vent SNF unit.  There was no answer.  CSW left a message requesting a returned call.    CSW called [facility] in [place], [phone] to inquire about their availability to accept patients in their vent SNF unit.  The receptionist informed CSW of the need to speak with Clydie Braun in admissions.  Clydie Braun was out of the office.  CSW left contact information and requested a returned call.    CSW called [facility] [phone], to inquire about their ability to accept the patient.  CSW left a message for Marlane Hatcher in admissions requesting a returned call with information about the facilities ability to accept the patient.  CSW spoke with Irving Burton at [facility].  The facility is reviewing to see if they can accept.    Expected Discharge Plan: Skilled Nursing Facility (Resides with sister ( W/C bound)) Barriers to Discharge: Continued Medical Work up  Expected Discharge Plan and Services Expected Discharge Plan: Skilled Nursing Facility (Resides with sister ( W/C bound))   Discharge Planning Services: CM Consult                                           Social Determinants of Health (SDOH)  Interventions    Readmission Risk Interventions No flowsheet data found.

## 2021-05-20 NOTE — Consult Note (Signed)
Chief Complaint: Patient was seen in consultation today for  Chief Complaint  Patient presents with   Post CPR    Referring Physician(s): Dr. Merrily Pewhand  Supervising Physician: Malachy MoanMcCullough, Heath  Patient Status: [facility] - [facility]pt  History of Present Illness: Stanley Jaclynn MajorShawn Zalenski is a 50 y.o. male with a medical history significant for tobacco abuse, emphysema, DM and CVA. He was admitted 04/29/21 with PEA cardiac arrest with ROSC after 6 minutes followed by Code Cool/targeted temperature management protocol. He remains unresponsive requiring full ventilator support via tracheostomy and Case management is pursuing LTACH options.  Interventional Radiology has been asked to evaluate this patient for an image-guided gastrostomy tube to facilitate his long-term nutritional needs.   No past medical history on file.  Allergies: Patient has no known allergies.  Medications: Prior to Admission medications   Medication Sig Start Date End Date Taking? Authorizing Provider  amLODipine (NORVASC) 5 MG tablet Take 5 mg by mouth daily. 04/09/21  Yes [provider]  atorvastatin (LIPITOR) 40 MG tablet Take 40 mg by mouth at bedtime. 04/09/21  Yes [provider]     Family History  Problem Relation Age of Onset   Coronary artery disease Father    Stroke Sister    Coronary artery disease Brother     Social History   Socioeconomic History   Marital status: Single    Spouse name: Not on file   Number of children: Not on file   Years of education: Not on file   Highest education level: Not on file  Occupational History   Not on file  Tobacco Use   Smoking status: Every Day    Types: Cigarettes   Smokeless tobacco: Never  Substance and Sexual Activity   Alcohol use: Not on file   Drug use: Not on file   Sexual activity: Not on file  Other Topics Concern   Not on file  Social History Narrative   Not on file   Social Determinants of Health   Financial Resource  Strain: Not on file  Food Insecurity: Not on file  Transportation Needs: Not on file  Physical Activity: Not on file  Stress: Not on file  Social Connections: Not on file    Review of Systems: A 12 point ROS discussed and pertinent positives are indicated in the HPI above.  All other systems are negative.  Review of Systems  Unable to perform ROS: Patient unresponsive   Vital Signs: BP (!) 143/89 (BP Location: Left Arm)   Pulse (!) 122   Temp 99.3 F (37.4 C) (Oral)   Resp (!) 24   Ht 5\' 8"  (1.727 m)   Wt 142 lb 10.2 oz (64.7 kg)   SpO2 100%   BMI 21.69 kg/m   Physical Exam Constitutional:      Appearance: He is diaphoretic.     Comments: Eyes open with persistent leftward gaze. Patient is diaphoretic with copious moisture beaded up on his face. His body is damp. Patient is unresponsive to verbal and physical stimuli.   HENT:     Mouth/Throat:     Mouth: Mucous membranes are dry.     Pharynx: Oropharynx is clear.  Cardiovascular:     Rate and Rhythm: Tachycardia present.  Pulmonary:     Comments: Tracheostomy/ventilator Abdominal:     General: There is distension.     Palpations: Abdomen is soft.     Comments: Hyperactive bowel sounds  Neurological:     Comments: Unresponsive  Imaging: CT ABDOMEN WO CONTRAST  Result Date: 05/14/2021 CLINICAL DATA:  Dysphagia. EXAM: CT ABDOMEN WITHOUT CONTRAST LIMITED TECHNIQUE: Multidetector CT imaging of the abdomen was performed following the standard protocol without IV contrast. COMPARISON:  KUB, 08/17/2021.  CTA PE, 04/29/2021. FINDINGS: Lower chest: Improved appearance of patchy basilar opacities, as compared to CT chest, 04/29/2021. Cardiomegaly. Small volume pericardial effusion. Ascending aorta atherosclerosis. Hepatobiliary: Normal noncontrast appearance. No gallstones, gallbladder wall thickening, or biliary dilatation. Pancreas: Unremarkable. No pancreatic ductal dilatation or surrounding inflammatory changes. Spleen:  Normal in size without focal abnormality. Adrenals/Urinary Tract: Adrenal glands are unremarkable. Kidneys are normal, without renal calculi, focal lesion, or hydronephrosis. Stomach/Bowel: Transpyloric enteric feeding tube, with tip past the ligament of Treitz. The stomach is unremarkable. Imaged bowel is not obstructed. Retrocecal appendix. Vascular/Lymphatic: Aortic atherosclerosis. No enlarged abdominal or pelvic lymph nodes. Other: No abdominal wall hernia or abnormality. No upper abdominal ascites. Musculoskeletal: Anterior abdominal wall soft tissue contusions, likely injection sites. No acute osseous findings. IMPRESSION: 1. Well-positioned, transpyloric enteric feeding tube. 2. Patient's anatomy is amenable for percutaneous gastrostomy placement. 3. Cardiomegaly with small volume pericardial effusion. 4. Aortic Atherosclerosis (ICD10-I70.0). Roanna Banning, MD [facility] Electronically Signed   By: Roanna Banning M.D.   On: 05/14/2021 08:11   DG Chest 1 View  Result Date: 04/30/2021 CLINICAL DATA:  Central line placement. EXAM: CHEST  1 VIEW COMPARISON:  Radiographs and CT 04/29/2021. FINDINGS: 1247 hours. New left IJ central venous catheter projects to the level of the superior cavoatrial junction. The tip of the endotracheal tube is slightly low, approximately 1.3 cm above the carina. Enteric tube projects below the diaphragm, tip not visualized. Stable mild cardiomegaly. The diffuse bilateral pulmonary opacities have partially cleared. No pneumothorax or large pleural effusion. The bones appear unchanged. IMPRESSION: 1. Left IJ central venous catheter projects to the superior cavoatrial junction. No pneumothorax. 2. Endotracheal tube is slightly low and could be retracted 2-3 cm for more optimal positioning. 3. Interval partial clearing of bilateral airspace opacities. Electronically Signed   By: Carey Bullocks M.D.   On: 04/30/2021 13:14   DG  Abd 1 View  Result Date: 05/08/2021 CLINICAL DATA:  Orogastric tube advancement EXAM: ABDOMEN - 1 VIEW COMPARISON:  05/08/2021 FINDINGS: Limited radiograph of the lower chest and upper abdomen was obtained for the purposes of enteric tube localization. Orogastric tube is seen coursing below the diaphragm with distal tip and side port terminating within the expected location of the gastric body. Additional large bore feeding tube remains in place with distal tip terminating at the level of the proximal jejunum. Patchy bibasilar airspace opacities. IMPRESSION: 1. Orogastric tube now terminates within the expected location of the gastric body. 2. Patchy bibasilar airspace opacities. Electronically Signed   By: Duanne Guess D.O.   On: 05/08/2021 16:13   DG Abd 1 View  Result Date: 05/03/2021 CLINICAL DATA:  Abdominal distension.  Ventilator dependent. EXAM: ABDOMEN - 1 VIEW COMPARISON:  05/03/2021 FINDINGS: Endogastric tube is in place, tip overlying the level of stomach. Feeding tube is in place, tip overlying the distal stomach. Partially imaged LEFT IJ central line. There has been improvement in dilatation of small bowel loops in the visualized portion of the abdomen. There is no evidence for free intraperitoneal air on the portable erect view. IMPRESSION: Interval placement of nasogastric tube. Nasogastric tube and feeding tube tips overlie the stomach. Improvement in small bowel dilatation. Electronically Signed   By: Norva Pavlov M.D.  On: 05/03/2021 14:11   DG Abd 1 View  Result Date: 05/03/2021 CLINICAL DATA:  Abdominal distension. EXAM: ABDOMEN - 1 VIEW COMPARISON:  Abdominal radiograph dated 04/30/2021. FINDINGS: An enteric tube terminates in the stomach. A dilated loop of large bowel in the right lower quadrant measures 9.6 cm in diameter. Multiple dilated loops of small bowel are noted in the mid and left hemiabdomen. Air-fluid levels and free intraperitoneal air cannot be excluded on the  supine exam. IMPRESSION: Dilated loops of small and large bowel may represent obstruction versus ileus. Upright abdominal radiographs could be performed to evaluate for air-fluid levels. Electronically Signed   By: Romona Curls M.D.   On: 05/03/2021 12:04   CT HEAD WO CONTRAST ( )  Result Date: 05/17/2021 CLINICAL DATA:  Mental status change with unknown cause EXAM: CT HEAD WITHOUT CONTRAST TECHNIQUE: Contiguous axial images were obtained from the base of the skull through the vertex without intravenous contrast. COMPARISON:  04/29/2021 FINDINGS: Brain: Remote infarcts in the right MCA and PCA distribution with extensive cortical involvement. No evidence of acute infarct, hemorrhage, hydrocephalus, or mass. Vascular: No hyperdense vessel or unexpected calcification. Skull: Normal. Negative for fracture or focal lesion. Sinuses/Orbits: Progression of sinus opacification which especially affects the left maxillary and bilateral sphenoid sinuses. Nasal intubation. Remote blowout fracture of the left orbital floor with fat herniation. IMPRESSION: 1. No acute intracranial finding or change from CT earlier this month. 2. Remote right MCA and PCA territory infarcts with extensive cortical involvement. 3. Nasal intubation with progressive sinus opacification. Electronically Signed   By: Marnee Spring M.D.   On: 05/17/2021 04:32   CT HEAD WO CONTRAST ( )  Result Date: 04/29/2021 CLINICAL DATA:  Mental status change EXAM: CT HEAD WITHOUT CONTRAST TECHNIQUE: Contiguous axial images were obtained from the base of the skull through the vertex without intravenous contrast. COMPARISON:  None. FINDINGS: Brain: No acute territorial infarction, hemorrhage or intracranial mass is visualized. Chronic right MCA infarct with extensive encephalomalacia involving the right frontal, parietal and temporal lobes as well as the right thalamus and basal ganglia. Moderate atrophy. Ex vacuo dilatation of right lateral ventricle.  Atrophy of right brainstem. Vascular: No hyperdense vessels.  Carotid vascular calcification Skull: Normal. Negative for fracture or focal lesion. Sinuses/Orbits: Mucosal thickening in the sinuses. Chronic appearing deformity of the medial wall right orbit. Other: Incomplete fusion posterior arch of C1 IMPRESSION: 1. No definite CT evidence for acute intracranial abnormality. 2. Atrophy and chronic right MCA infarct. Electronically Signed   By: Jasmine Pang M.D.   On: 04/29/2021 22:09   CT Angio Chest PE W and/or Wo Contrast  Result Date: 04/29/2021 CLINICAL DATA:  PE suspected, high prob Post CPR. EXAM: CT ANGIOGRAPHY CHEST WITH CONTRAST TECHNIQUE: Multidetector CT imaging of the chest was performed using the standard protocol during bolus administration of intravenous contrast. Multiplanar CT image reconstructions and MIPs were obtained to evaluate the vascular anatomy. CONTRAST:  50mL OMNIPAQUE IOHEXOL 350 MG/ML SOLN COMPARISON:  Chest radiograph earlier today. FINDINGS: Cardiovascular: There are no filling defects within the pulmonary arteries to suggest pulmonary embolus. Mild aortic atherosclerosis. Cannot assess for dissection given phase of contrast tailored to pulmonary arteries S1. Multi chamber cardiomegaly. Minimal contrast refluxes into the hepatic veins and IVC. Moderate size circumferential pericardial effusion. This measures up to 17 mm in depth adjacent to the right ventricle. Mediastinum/Nodes: Shotty mediastinal adenopathy, including right anterior paratracheal node measuring 10 mm, series 5, image 37. Bilateral hilar lymph nodes measuring 9-10 mm. The  esophagus is decompressed by enteric tube. No visualized thyroid nodule. Lungs/Pleura: The endotracheal tube tip is at the level of the carina, recommend retraction of 2-3 cm. Dense lower lobe consolidation, left greater than right, suspicious for aspiration. There additional patchy, ground-glass and confluent airspace opacities throughout both  lungs. Mild smooth septal thickening. Underlying emphysema which is partially obscured by superimposed airspace disease. There is a 2.1 x 2.6 cm nodular density posteriorly in the left upper lobe abutting the pleura, series 6, image 26, partially obscured by adjacent airspace disease. Small bilateral pleural effusions, as well as fluid tracking into the right minor fissure. No pneumothorax. Upper Abdomen: No adrenal nodule. No acute upper abdominal findings. Probable scarring in the upper left kidney. Motion obscures evaluation of the upper abdomen. Musculoskeletal: No acute osseous abnormality. No anterior rib fractures typically seen with CPR. No focal bone lesion. Review of the MIP images confirms the above findings. IMPRESSION: 1. No pulmonary embolus. 2. Multi chamber cardiomegaly with moderate circumferential pericardial effusion. Minimal contrast refluxes into the hepatic veins and IVC consistent with elevated right heart pressures. 3. Dense lower lobe consolidation, left greater than right, suspicious for aspiration. Small bilateral pleural effusions. 4. There is set the thickening in ground-glass opacities suspicious for pulmonary edema. Superimposed airspace disease within there is a ground-glass opacity may represent confluent edema or infection. 5. Shotty mediastinal and hilar adenopathy is likely reactive, but nonspecific. 6. Endotracheal tube tip is at the level of the carina, recommend retraction of 2-3 cm. 7. A 2.1 x 2.6 cm nodular density posteriorly in the left upper lobe abutting the pleura is nonspecific given the adjacent parenchymal findings, however recommend attention at follow-up to exclude the possibility of pulmonary mass. Aortic Atherosclerosis (ICD10-I70.0) and Emphysema (ICD10-J43.9). Electronically Signed   By: Narda Rutherford M.D.   On: 04/29/2021 22:02   MR BRAIN WO CONTRAST  Result Date: 05/04/2021 CLINICAL DATA:  Anoxic brain injury EXAM: MRI HEAD WITHOUT CONTRAST TECHNIQUE:  Multiplanar, multiecho pulse sequences of the brain and surrounding structures were obtained without intravenous contrast. COMPARISON:  None. FINDINGS: Brain: No acute infarct, mass effect or extra-axial collection. Chronic deposition of blood products in the right MCA territory. Right MCA territory encephalomalacia with white matter signal changes. Wallerian degeneration of the right cerebral peduncle. The midline structures are normal. Vascular: Major flow voids are preserved. Skull and upper cervical spine: Normal calvarium and skull base. Visualized upper cervical spine and soft tissues are normal. Sinuses/Orbits:Moderate paranasal sinus disease.  Normal orbits. IMPRESSION: 1. No acute intracranial abnormality. 2. Right MCA territory encephalomalacia. Electronically Signed   By: Deatra Robinson M.D.   On: 05/04/2021 02:54   US RENAL  Result Date: 05/01/2021 CLINICAL DATA:  Acute kidney injury. EXAM: RENAL / URINARY TRACT ULTRASOUND COMPLETE COMPARISON:  None. FINDINGS: Image quality is degraded by body habitus. Right Kidney: Renal measurements: 9.9 x 5.3 x 6.6 cm = volume: 183 mL. Increased parenchymal echogenicity. No mass or hydronephrosis visualized. Left Kidney: Renal measurements: 9.0 x 5.7 x 5.7 cm = volume: 152 mL. Suboptimally visualized. No hydronephrosis or gross mass. Bladder: Appears normal for degree of bladder distention. Other: Bilateral pleural effusions and small volume ascites are noted. IMPRESSION: 1. Echogenic right kidney compatible with medical renal disease. 2. Suboptimal visualization of the left kidney. 3. No hydronephrosis. 4. Bilateral pleural effusions and small volume ascites. Electronically Signed   By: Sebastian Ache M.D.   On: 05/01/2021 11:32   DG CHEST PORT 1 VIEW  Result Date: 05/14/2021 CLINICAL  DATA:  Respiratory distress. EXAM: PORTABLE CHEST 1 VIEW COMPARISON:  CT abdomen pelvis, 05/13/2021. Chest radiograph, 05/12/2021. FINDINGS: Support lines: Tracheostomy tube with  tip at the midthoracic trachea. Enteric feeding tube, with tip excluded from view. Cardiomegaly. Aortic vascular calcifications. Hypoinflation with streaky perihilar opacities, most prominently on the left. No focal consolidation. No large pleural effusion or pneumothorax. No interval osseous abnormality. IMPRESSION: 1. Lines and tubes as above. 2. Cardiomegaly with mild interstitial pulmonary edema. 3. Aortic Atherosclerosis (ICD10-I70.0). Electronically Signed   By: Roanna Banning M.D.   On: 05/14/2021 16:53   DG CHEST PORT 1 VIEW  Result Date: 05/12/2021 CLINICAL DATA:  Status post tracheostomy EXAM: PORTABLE CHEST 1 VIEW COMPARISON:  05/06/2021 FINDINGS: Tracheostomy tube is noted in satisfactory position. Feeding catheter extends into the stomach. Cardiac shadow is enlarged. No focal infiltrate or effusion is seen. IMPRESSION: Tracheostomy tube in satisfactory position. No acute abnormality noted. Electronically Signed   By: Alcide Clever M.D.   On: 05/12/2021 17:41   DG CHEST PORT 1 VIEW  Result Date: 05/06/2021 CLINICAL DATA:  Hypoxia. EXAM: PORTABLE CHEST 1 VIEW COMPARISON:  05/01/2021. FINDINGS: Previously seen layering bilateral pleural effusions appear improved, likely resolved. Overall improved aeration of the lungs with persistent mild diffuse interstitial opacities. No visible pneumothorax on this semi erect radiograph. Mild enlargement the cardiac silhouette, which appears improved. Also, improved pulmonary vascular congestion. Enteric tube courses below the diaphragm with the tip outside the field of view. Left IJ approach central venous catheter with the tip projecting at the superior cavoatrial junction. Endotracheal tube tip projects approximately 3.8 cm above the carina. IMPRESSION: 1. Overall, improved aeration of the lungs with persistent mild diffuse interstitial opacities, which could represent mild interstitial edema or atypical infection. 2. Previously seen layering bilateral pleural  effusions appear improved, likely resolved. 3. Improved mild cardiomegaly and vascular congestion. Electronically Signed   By: Feliberto Harts MD   On: 05/06/2021 13:28   DG CHEST PORT 1 VIEW  Result Date: 05/01/2021 CLINICAL DATA:  Respiratory distress, status post CPR EXAM: PORTABLE CHEST 1 VIEW COMPARISON:  04/30/2021 FINDINGS: No significant change in AP portable chest radiograph featuring mild, diffuse bilateral interstitial pulmonary opacity with small, layering bilateral pleural effusions and cardiomegaly. Support apparatus unchanged including endotracheal tube, left neck vascular catheter, and partially imaged enteric feeding tube. IMPRESSION: 1. No significant change in AP portable chest radiograph with diffuse bilateral interstitial opacity, small, layering bilateral pleural effusions, and cardiomegaly, findings likely reflecting edema. 2. Unchanged support apparatus. Electronically Signed   By: Lauralyn Primes M.D.   On: 05/01/2021 09:08   DG Chest Portable 1 View  Result Date: 04/29/2021 CLINICAL DATA:  Post CPR.  Cardiac arrest EXAM: PORTABLE CHEST 1 VIEW COMPARISON:  None FINDINGS: Endotracheal tube terminates 2.2 cm above carina. External pacer/defibrillator is. Midline trachea. Mild cardiomegaly. No pleural effusion or pneumothorax. Interstitial and airspace disease is relatively diffuse but greater on the left than right. IMPRESSION: Appropriate position of endotracheal tube. Cardiomegaly with left greater than right interstitial and airspace disease. Favor asymmetric pulmonary edema. Given asymmetry, aspiration is possible but felt less likely. Electronically Signed   By: Jeronimo Greaves M.D.   On: 04/29/2021 20:53   DG Abd Portable 1V  Result Date: 05/08/2021 CLINICAL DATA:  Orogastric tube placement. EXAM: PORTABLE ABDOMEN - 1 VIEW COMPARISON:  May 05, 2021. FINDINGS: The bowel gas pattern is normal. Distal tip of feeding tube is seen in expected position of proximal jejunum. IMPRESSION:  Distal tip of  feeding tube seen in expected position of proximal jejunum. Electronically Signed   By: Lupita Raider M.D.   On: 05/08/2021 14:45   DG Abd Portable 1V  Result Date: 05/05/2021 CLINICAL DATA:  Feeding tube placement EXAM: PORTABLE ABDOMEN - 1 VIEW COMPARISON:  Portable exam 1215 hours compared to 05/03/2021 FINDINGS: Tip of nasogastric tube projects over stomach. Feeding tube traverses stomach and duodenal C-loop with tip beyond ligament of Treitz in the LEFT upper quadrant. Slight gaseous distention of RIGHT colon. IMPRESSION: Tip of feeding tube is beyond ligament of Treitz in the LEFT upper quadrant. Electronically Signed   By: Ulyses Southward M.D.   On: 05/05/2021 12:41   DG Abd Portable 1V  Result Date: 04/30/2021 CLINICAL DATA:  Enteric catheter placement EXAM: PORTABLE ABDOMEN - 1 VIEW COMPARISON:  None. FINDINGS: Supine frontal view of the lower chest and upper abdomen demonstrates enteric catheter tip projecting over the gastric antrum. There is a paucity of bowel gas. No masses or abnormal calcifications. IMPRESSION: 1. Enteric catheter tip projecting over the gastric antrum. Electronically Signed   By: Sharlet Salina M.D.   On: 04/30/2021 15:05   EEG adult  Result Date: 04/30/2021 Rejeana Brock, MD     04/30/2021  2:15 AM History: 51 year old male status post cardiac arrest Sedation: Propofol Technique: This is a 21 channel routine scalp EEG performed at the bedside with bipolar and monopolar montages arranged in accordance to the international 10/20 system of electrode placement. One channel was dedicated to EKG recording. Background: The background is diffusely attenuated with ventilator artifact.  There is some degree of muscle artifact throughout most of the recording, but no definite background activity is seen.  He has several episodes of "shaking" without definite EEG change, other than significant muscle artifact.  With one of these episodes muscle artifact does  significantly obscure the background, but to a degree I can tell there was no significant EEG change. Photic stimulation: Physiologic driving is not performed EEG Abnormalities: Diffusely attenuated background Clinical Interpretation: This EEG is severely abnormal with diffuse attenuation of the background.  There was no evidence of the muscle jerking seen was epileptiform in nature.  There was no seizure or seizure predisposition recorded on this study. Please note that lack of epileptiform activity on EEG does not preclude the possibility of epilepsy. Ritta Slot, MD [facility] [phone] If 7pm- 7am, please page neurology on call as listed in AMION.   Overnight EEG with video  Result Date: 05/06/2021 Charlsie Quest, MD     05/06/2021  1:24 PM Patient Name: Ching Rabideau MRN: [id number] Epilepsy Attending: Charlsie Quest Referring Physician/Provider: Lanae Boast, NP Duration: 05/05/2021 2109 to 05/06/2021 1222  Patient history: 50 year old male status post cardiac arrest. EEG to evaluate for seizure  Level of alertness:  comatose  AEDs during EEG study: LEV, Klonopin  Technical aspects: This EEG study was done with scalp electrodes positioned according to the 10-20 International system of electrode placement. Electrical activity was acquired at a sampling rate of  and reviewed with a high frequency filter of  and a low frequency filter of . EEG data were recorded continuously and digitally stored.  Description: EEG showed continuous generalized 3 to 5 Hz theta-delta slowing.  Multiple events were recorded. During the events, patient was noted to have sudden bilateral upper extremity jerking. Concomitant EEG before, during and after the event did not show any significant myogenic artifact but no definite EEG change was noted suggest seizure.  Hyperventilation and photic stimulation were not performed.    ABNORMALITY -Continuous slow, generalized  IMPRESSION: This  study is suggestive of moderate to severe diffuse encephalopathy, nonspecific to etiology.  Multiple events were recorded during which patient had sudden bilateral upper extremity jerking without concomitant EEG change.  These were most likely nonepileptic events.  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  Priyanka Annabelle Harman   Overnight EEG with video  Result Date: 04/30/2021 Charlsie Quest, MD     05/01/2021  9:26 AM Patient Name: Adarrius Graeff MRN: [id number] Epilepsy Attending: Charlsie Quest Referring Physician/Provider: Rutherford Guys, PA Duration: 04/30/2021 0202 to 05/01/2021 0202 Patient history: 50 year old male status post cardiac arrest. EEG to evaluate for seizure Level of alertness:  comatose AEDs during EEG study: LEV, propofol Technical aspects: This EEG study was done with scalp electrodes positioned according to the 10-20 International system of electrode placement. Electrical activity was acquired at a sampling rate of 500Hz  and reviewed with a high frequency filter of 70Hz  and a low frequency filter of 1Hz . EEG data were recorded continuously and digitally stored. Description: EEG initially showed continuous generalized background suppression.  EEG was not reactive to tactile stimulation.  Gradually EEG showed intermittent generalized sharply contoured 3 to 5 Hz theta-delta slowing with 2 to 3 seconds of generalized EEG suppression.  After around midnight on 05/01/2021, EEG showed near continuous 3 to 5 Hz theta-delta slowing. Spikes were also noted in right frontotemporal region. Event button was pressed on 04/30/2021 at 0752 for tremors in arms and chest. Concomitant EEG before, during and after the event did not show any EEG changes suggest seizure. Hyperventilation and photic stimulation were not performed.   ABNORMALITY -Background suppression, generalized -Continuous slow, generalized -Spikes, right frontotemporal region IMPRESSION: This study showed  evidence of epileptogenicity arising from right frontotemporal region.  The study was also initially suggestive of profound diffuse encephalopathy which gradually improved to severe diffuse encephalopathy, nonspecific to etiology.  However with a history of cardiac arrest this could be secondary to anoxic/hypoxic brain injury, sedation.  No seizures were seen throughout the recording. Event button was pressed on 04/30/2021 at 0752 for tremors in arms and chest without concomitant EEG change. This was most likely not an epileptic event. 07/01/2021   ECHOCARDIOGRAM COMPLETE  Result Date: 04/30/2021    ECHOCARDIOGRAM REPORT   Patient Name:   [facility] Date of Exam: 04/30/2021 Medical Rec #:  [facility]               Height:       68.0 in Accession #:    [id number]              Weight:       154.5 lb Date of Birth:  08/31/1971                BSA:          1.832 m Patient Age:    50 years                BP:           69/54 mmHg Patient Gender: M                       HR:           110 bpm. Exam Location:  Inpatient Procedure: 2D Echo, Cardiac Doppler and Color Doppler Indications:    Cardiac Arrest I46.9  History:        Patient has no prior history of Echocardiogram examinations.  Sonographer:    Elmarie Shiley Dance Referring Phys: [id number] RAHUL P DESAI  Sonographer Comments: Echo performed with patient supine and on artificial respirator. Reading Cardiologist notified. IMPRESSIONS  1. Minor contractile sparing of the lateral wall. Left ventricular ejection fraction, by estimation, is <20%. The left ventricle has severely decreased function. The left ventricle demonstrates global hypokinesis. Left ventricular diastolic parameters are consistent with Grade III diastolic dysfunction (restrictive).  2. Right ventricular systolic function is normal. The right ventricular size is normal. There is mildly elevated pulmonary artery systolic pressure. The estimated right ventricular systolic pressure is 40.8 mmHg.  3.  A small pericardial effusion is present. The pericardial effusion is circumferential. There is no evidence of cardiac tamponade.  4. The mitral valve is normal in structure. Trivial mitral valve regurgitation. No evidence of mitral stenosis.  5. The aortic valve is normal in structure. Aortic valve regurgitation is not visualized. No aortic stenosis is present.  6. The inferior vena cava is dilated in size with <50% respiratory variability, suggesting right atrial pressure of 15 mmHg. FINDINGS  Left Ventricle: Minor contractile sparing of the lateral wall. Left ventricular ejection fraction, by estimation, is <20%. The left ventricle has severely decreased function. The left ventricle demonstrates global hypokinesis. The left ventricular internal cavity size was normal in size. There is no left ventricular hypertrophy. Left ventricular diastolic parameters are consistent with Grade III diastolic dysfunction (restrictive). Right Ventricle: The right ventricular size is normal. No increase in right ventricular wall thickness. Right ventricular systolic function is normal. There is mildly elevated pulmonary artery systolic pressure. The tricuspid regurgitant velocity is 2.54  m/s, and with an assumed right atrial pressure of 15 mmHg, the estimated right ventricular systolic pressure is 40.8 mmHg. Left Atrium: Left atrial size was normal in size. Right Atrium: Right atrial size was normal in size. Pericardium: A small pericardial effusion is present. The pericardial effusion is circumferential. There is no evidence of cardiac tamponade. Mitral Valve: The mitral valve is normal in structure. Trivial mitral valve regurgitation. No evidence of mitral valve stenosis. Tricuspid Valve: The tricuspid valve is normal in structure. Tricuspid valve regurgitation is not demonstrated. No evidence of tricuspid stenosis. Aortic Valve: The aortic valve is normal in structure. Aortic valve regurgitation is not visualized. No aortic  stenosis is present. Pulmonic Valve: The pulmonic valve was normal in structure. Pulmonic valve regurgitation is trivial. No evidence of pulmonic stenosis. Aorta: The aortic root is normal in size and structure. Venous: The inferior vena cava is dilated in size with less than 50% respiratory variability, suggesting right atrial pressure of 15 mmHg. IAS/Shunts: No atrial level shunt detected by color flow Doppler.   Diastology LV e' medial:   2.08 cm/s LV E/e' medial: 28.9  RIGHT VENTRICLE            IVC RV Basal diam:  3.30 cm    IVC diam: 2.30 cm RV Mid diam:    2.70 cm RV S prime:     5.06 cm/s TAPSE (M-mode): 0.9 cm LEFT ATRIUM             Index       RIGHT ATRIUM           Index LA Vol (A2C):   50.8 ml 27.74 ml/m RA Area:     12.50 cm LA Vol (A4C):   37.5 ml 20.47 ml/m RA Volume:   31.20  ml  17.03 ml/m LA Biplane Vol: 44.6 ml 24.35 ml/m  AORTIC VALVE LVOT Vmax:   30.30 cm/s LVOT Vmean:  22.250 cm/s LVOT VTI:    0.037 m  AORTA Ao Asc diam: 2.70 cm MITRAL VALVE               TRICUSPID VALVE MV Area (PHT): 3.66 cm    TR Peak grad:   25.8 mmHg MV Decel Time: 207 msec    TR Vmax:        254.00 cm/s MV E velocity: 60.10 cm/s MV A velocity: 41.70 cm/s  SHUNTS MV E/A ratio:  1.44        Systemic VTI: 0.04 m Donato Schultz MD Electronically signed by Donato Schultz MD Signature Date/Time: 04/30/2021/11:37:26 AM    Final    ECHOCARDIOGRAM LIMITED  Result Date: 05/04/2021    ECHOCARDIOGRAM LIMITED REPORT   Patient Name:   [facility] Date of Exam: 05/04/2021 Medical Rec #:  [id number]               Height:       68.0 in Accession #:    [id number]              Weight:       165.8 lb Date of Birth:  11-08-1970                BSA:          1.887 m Patient Age:    50 years                BP:           112/82 mmHg Patient Gender: M                       HR:           75 bpm. Exam Location:  Inpatient Procedure: Limited Echo and Cardiac Doppler Indications:    Congestive Heart Failure I50.9  History:        Patient has  prior history of Echocardiogram examinations, most                 recent 04/30/2021.  Sonographer:    Celesta Gentile RCS Referring Phys: [id number] Martina Sinner  Sonographer Comments: Patient on mechanical ventilator during exam. IMPRESSIONS  1. Left ventricular ejection fraction, by estimation, is 20 to 25%. The left ventricle has severely decreased function. There is mild asymmetric left ventricular hypertrophy of the posterior-lateral segment.  2. Right ventricular systolic function is normal. The right ventricular size is mildly enlarged.  3. Small to moderate pericardial effusion. The pericardial effusion is posterior to the left ventricle and localized near the right atrium. There is no evidence of cardiac tamponade. Comparison(s): A prior study was performed on 04/30/21. Prior images reviewed side by side. Grossly similar amount of pericardial fluid compared to prior study. FINDINGS  Left Ventricle: Left ventricular ejection fraction, by estimation, is 20 to 25%. The left ventricle has severely decreased function. There is mild asymmetric left ventricular hypertrophy of the posterior-lateral segment. Right Ventricle: The right ventricular size is mildly enlarged. Right ventricular systolic function is normal. Pericardium: Small to moderate pericardial effusion. The pericardial effusion is posterior to the left ventricle and localized near the right atrium. There is no evidence of cardiac tamponade. LEFT VENTRICLE PLAX 2D LVIDd:         5.10 cm LVIDs:  4.40 cm LV PW:         1.10 cm LV IVS:        0.90 cm LVOT diam:     2.10 cm LVOT Area:     3.46 cm  LV Volumes (MOD) LV vol d, MOD A2C: 100.0 ml LV vol d, MOD A4C: 95.5 ml LV vol s, MOD A2C: 66.3 ml LV vol s, MOD A4C: 58.0 ml LV SV MOD A2C:     33.7 ml LV SV MOD A4C:     95.5 ml LV SV MOD BP:      36.2 ml LEFT ATRIUM         Index LA diam:    4.30 cm 2.28 cm/m   AORTA Ao Root diam: 3.20 cm  SHUNTS Systemic Diam: 2.10 cm Weston Brass MD Electronically  signed by Weston Brass MD Signature Date/Time: 05/04/2021/5:32:46 PM    Final     Labs:  CBC: Recent Labs    05/15/21 0202 05/16/21 0209 05/17/21 0036 05/19/21 0904  WBC 13.8* 12.9* 9.9 10.2  HGB 10.3* 10.5* 10.2* 10.9*  HCT 31.8* 31.9* 32.0* 34.7*  PLT 453* 544* 559* 644*    COAGS: Recent Labs    04/30/21 0022  INR 1.2  APTT 33    BMP: Recent Labs    05/16/21 0209 05/17/21 0036 05/18/21 0332 05/19/21 0904  NA 140 137 143 141  K 4.4 4.0 4.4 4.5  CL 104 104 107 107  CO2 GLUCOSE 130* 123* 138* 129*  BUN 39* 36* 39* 33*  CALCIUM 9.5 9.0 9.7 9.5  CREATININE 1.16 1.04 1.02 0.95  GFRNONAA >60 >60 >60 >60    LIVER FUNCTION TESTS: Recent Labs    04/29/21 2040  BILITOT 0.9  AST 34  ALT 31  ALKPHOS 49  PROT 5.7*  ALBUMIN 3.0*    TUMOR MARKERS: No results for input(s): AFPTM, CEA, CA199, CHROMGRNA in the last 8760 hours.  Assessment and Plan:  PEA arrest; vegetative state: Fermin S. Lybrand, 50 year old male, is tentatively scheduled for 05/21/21 for an image-guided gastrostomy tube. Telephone consent was obtained from his sister, Clarise Cruz Little, and is in the IR control room.  Risks and benefits image guided gastrostomy tube placement was discussed with the patient including, but not limited to the need for a barium enema during the procedure, bleeding, infection, peritonitis and/or damage to adjacent structures.  All of the patient's questions were answered, patient is agreeable to proceed. AM labs have been ordered. Tube feeds will be turned off at midnight. Subcutaneous heparin will be held the day of the procedure.   Thank you for this interesting consult.  I greatly enjoyed meeting Hernando Brad Lieurance and look forward to participating in their care.  A copy of this report was sent to the requesting provider on this date.  Electronically Signed: Alwyn Ren, AGACNP-BC [phone] 05/20/2021, 1:18 PM   I spent a total of 20  Minutes    in face to face in clinical consultation, greater than 50% of which was counseling/coordinating care for gastrostomy tube placement.

## 2021-05-20 NOTE — Progress Notes (Addendum)
NAME:  Vincent [name], MRN:  [id number], DOB:  08-01-1971, LOS: 21 ADMISSION DATE:  04/29/2021, CONSULTATION DATE:  04/29/21 REFERRING MD:  Dr. Jacqulyn Bath, CHIEF COMPLAINT:  Found Down   History of present illness   50 yo male smoker admitted with PEA cardiac arrest with ROSC after 6 minutes.  CT head showed old infarct. CT chest showed b/l dense lower lobe consolidation with changes of emphysema. UDS positive for benzodiazepines and THC.  Past Medical History  Emphysema, CVA, DM type 2  Significant Hospital Events   8/2: PEA Arrest, King airway in the field, CPR x6 minutes and epi x1, ROSC, Intubated in ED, Admitted to PCCM  8/4: Weaned off TTM, Coox O2 sat of 47%, started on milrinone and lasix 8/5 milrinone increased 8/6 TF stopped, OG placed to suction for ileus 8/8 Post pyloric Cortrak placed, myoclonic jerking, started Keppra, depakote 8/10 Completed antibiotic course for pneumonia. Family meeting transitioned to DNR. 8/15 Tracheostomy  8/18 Febrile overnight, started Zosyn for aspiration pneumonia 8/22 Completed Abx course for aspiration pneumonia   Interim history/subjective:  Remains on full vent support.  Spoke with sister, Raynelle Fanning, over the phone, who appreciates the update. She states that IR called her this AM for consent, but she was not available, she states that she will call them back.   Objective   Blood pressure 122/76, pulse 91, temperature 98.2 F (36.8 C), temperature source Oral, resp. rate 14, height 5\' 8"  (1.727 m), weight 64.7 kg, SpO2 99 %.    Vent Mode: PRVC FiO2 (%):  [40 %] 40 % Set Rate:  [14 bmp] 14 bmp Vt Set:  [550 mL] 550 mL PEEP:  [8 cmH20] 8 cmH20 Pressure Support:  [12 cmH20] 12 cmH20 Plateau Pressure:  [17 cmH20-19 cmH20] 17 cmH20   Intake/Output Summary (Last 24 hours) at 05/20/2021 0743 Last data filed at 05/20/2021 05/22/2021 Gross per 24 hour  Intake 1584.59 ml  Output 1350 ml  Net 234.59 ml    Filed Weights   05/18/21 0500  05/19/21 0500 05/20/21 0500  Weight: 64.2 kg 64.7 kg 64.7 kg    Physical Exam Constitutional:      General: He is not in acute distress.    Appearance: He is diaphoretic.     Comments: Ill appearing, NAD  HENT:     Head: Normocephalic.  Eyes:     Pupils: Pupils are equal, round, and reactive to light.  Cardiovascular:     Rate and Rhythm: Normal rate and regular rhythm.     Pulses: Normal pulses.     Heart sounds: Normal heart sounds.  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 - Continue Keppra - Klonopin PRN  Acute Systolic/Diastolic CHF after Cardiac Arrest: - Continue lopressor, lisinopril - Even to negative fluid balance as tolerated  Acute Hypoxic Respiratory Failure from Cardiac Arrest and Aspiration Pneumonia. Failure to wean from ventilator s/p tracheostomy. Centrilobular emphysema. - pressure support wean as tolerated - Trach Care - PRN Duonebs - VAP Protocol  2.6 x 2.1 cm nodular density in Lt upper lobe abutting pleura. - seen on CT chest 04/29/21 - will need repeat CT imaging in September  DM type 2 poorly controlled with hyperglycemia. - SSI  Anemia of critical illness. - f/u CBC intermittently - transfuse for Hb < 7  Dysphagia. - IR consulted for G tube placement  Disposition. - CM/SW looking into LTACH options, but insurance coverage seems to be limiting factor  Best practice:  Diet/type: tube feeds DVT prophylaxis: SQ heparin GI prophylaxis: protonix Code Status: DNR Disposition: ICU  Labs    CMP Latest Ref Rng & Units 05/19/2021 05/18/2021 05/17/2021  Glucose 70 - 99 mg/dL 481(E) 563(J) 497(W)  BUN 6 - 20 mg/dL 26(V)  78(H) 88(F)  Creatinine 0.61 - 1.24 mg/dL 0.27 7.41 2.87  Sodium 135 - 145 mmol/L 141 143 137  Potassium 3.5 - 5.1 mmol/L 4.5 4.4 4.0  Chloride 98 - 111 mmol/L 107 107 104  CO2 22 - 32 mmol/L 24 24 23   Calcium 8.9 - 10.3 mg/dL 9.5 9.7 9.0  Total Protein 6.5 - 8.1 g/dL - - -  Total Bilirubin 0.3 - 1.2 mg/dL - - -  Alkaline Phos 38 - 126 U/L - - -  AST 15 - 41 U/L - - -  ALT 0 - 44 U/L - - -    CBC Latest Ref Rng & Units 05/19/2021 05/17/2021 05/16/2021  WBC 4.0 - 10.5 K/uL 10.2 9.9 12.9(H)  Hemoglobin 13.0 - 17.0 g/dL 10.9(L) 10.2(L) 10.5(L)  Hematocrit 39.0 - 52.0 % 34.7(L) 32.0(L) 31.9(L)  Platelets 150 - 400 K/uL 644(H) 559(H) 544(H)    ABG    Component Value Date/Time   PHART 7.494 (H) 05/02/2021 1822   PCO2ART 30.6 (L) 05/02/2021 1822   PO2ART 95 05/02/2021 1822   HCO3 23.5 05/02/2021 1822   TCO2 24 05/02/2021 1822   ACIDBASEDEF 9.0 (H) 04/30/2021 0815   O2SAT 70.2 05/08/2021 0317    CBG (last 3)  Recent Labs    05/19/21 2338 05/20/21 0451 05/20/21 0736  GLUCAP 109* 122* 116*   05/22/21, MD IMTS, PGY-3 Pager: [phone] 05/20/2021,7:43 AM

## 2021-05-21 ENCOUNTER — Inpatient Hospital Stay (HOSPITAL_COMMUNITY): Payer: Medicaid Other

## 2021-05-21 DIAGNOSIS — G931 Anoxic brain damage, not elsewhere classified: Secondary | ICD-10-CM

## 2021-05-21 HISTORY — PX: IR GASTROSTOMY TUBE MOD SED: IMG625

## 2021-05-21 LAB — CBC
HCT: 33.7 % — ABNORMAL LOW (ref 39.0–52.0)
Hemoglobin: 10.8 g/dL — ABNORMAL LOW (ref 13.0–17.0)
MCH: 28.6 pg (ref 26.0–34.0)
MCHC: 32 g/dL (ref 30.0–36.0)
MCV: 89.2 fL (ref 80.0–100.0)
Platelets: 556 10*3/uL — ABNORMAL HIGH (ref 150–400)
RBC: 3.78 MIL/uL — ABNORMAL LOW (ref 4.22–5.81)
RDW: 14.2 % (ref 11.5–15.5)
WBC: 9.2 10*3/uL (ref 4.0–10.5)
nRBC: 0 % (ref 0.0–0.2)

## 2021-05-21 LAB — COMPREHENSIVE METABOLIC PANEL
ALT: 144 U/L — ABNORMAL HIGH (ref 0–44)
AST: 46 U/L — ABNORMAL HIGH (ref 15–41)
Albumin: 3.2 g/dL — ABNORMAL LOW (ref 3.5–5.0)
Alkaline Phosphatase: 68 U/L (ref 38–126)
Anion gap: 9 (ref 5–15)
BUN: 41 mg/dL — ABNORMAL HIGH (ref 6–20)
CO2: 24 mmol/L (ref 22–32)
Calcium: 9.5 mg/dL (ref 8.9–10.3)
Chloride: 109 mmol/L (ref 98–111)
Creatinine, Ser: 1 mg/dL (ref 0.61–1.24)
GFR, Estimated: >60 mL/min (ref >60)
Glucose, Bld: 107 mg/dL — ABNORMAL HIGH (ref 70–99)
Potassium: 4.4 mmol/L (ref 3.5–5.1)
Sodium: 142 mmol/L (ref 135–145)
Total Bilirubin: 0.4 mg/dL (ref 0.3–1.2)
Total Protein: 7.4 g/dL (ref 6.5–8.1)

## 2021-05-21 LAB — GLUCOSE, CAPILLARY
Glucose-Capillary: 104 mg/dL — ABNORMAL HIGH (ref 70–99)
Glucose-Capillary: 105 mg/dL — ABNORMAL HIGH (ref 70–99)
Glucose-Capillary: 114 mg/dL — ABNORMAL HIGH (ref 70–99)
Glucose-Capillary: 117 mg/dL — ABNORMAL HIGH (ref 70–99)
Glucose-Capillary: 91 mg/dL (ref 70–99)
Glucose-Capillary: 97 mg/dL (ref 70–99)

## 2021-05-21 LAB — PROTIME-INR
INR: 1.1 (ref 0.8–1.2)
Prothrombin Time: 14 seconds (ref 11.4–15.2)

## 2021-05-21 LAB — PHOSPHORUS: Phosphorus: 5.4 mg/dL — ABNORMAL HIGH (ref 2.5–4.6)

## 2021-05-21 MED ORDER — STERILE WATER FOR INJECTION IJ SOLN
INTRAMUSCULAR | Status: AC
  Filled 2021-05-21: qty 10

## 2021-05-21 MED ORDER — MIDAZOLAM HCL 2 MG/2ML IJ SOLN
INTRAMUSCULAR | Status: AC | PRN
  Administered 2021-05-21: 1 mg via INTRAVENOUS
  Administered 2021-05-21: 0.5 mg via INTRAVENOUS

## 2021-05-21 MED ORDER — OXYCODONE HCL 5 MG PO TABS
5.0000 mg | ORAL_TABLET | Freq: Four times a day (QID) | ORAL | Status: DC | PRN
  Administered 2021-05-24 – 2021-05-26 (×3): 5 mg via ORAL
  Filled 2021-05-21 (×4): qty 1

## 2021-05-21 MED ORDER — FENTANYL CITRATE (PF) 100 MCG/2ML IJ SOLN
INTRAMUSCULAR | Status: AC
  Filled 2021-05-21: qty 2

## 2021-05-21 MED ORDER — CEFAZOLIN SODIUM-DEXTROSE 1-4 GM/50ML-% IV SOLN: INTRAVENOUS | Status: AC | PRN

## 2021-05-21 MED ORDER — MIDAZOLAM HCL 2 MG/2ML IJ SOLN
INTRAMUSCULAR | Status: AC
  Filled 2021-05-21: qty 2

## 2021-05-21 MED ORDER — LIDOCAINE-EPINEPHRINE 1 %-1:100000 IJ SOLN
INTRAMUSCULAR | Status: AC
  Filled 2021-05-21: qty 1

## 2021-05-21 MED ORDER — FENTANYL CITRATE (PF) 100 MCG/2ML IJ SOLN
INTRAMUSCULAR | Status: AC | PRN
  Administered 2021-05-21: 25 ug via INTRAVENOUS
  Administered 2021-05-21: 50 ug via INTRAVENOUS

## 2021-05-21 NOTE — Procedures (Addendum)
Interventional Radiology Procedure Note ? ?Procedure: Placement of percutaneous 20F balloon-retention gastrostomy tube. ? ?Complications: None ? ?Recommendations: ?- NPO except for sips and chips remainder of today and overnight ?- Maintain G-tube to LWS until tomorrow morning  ?- May advance diet as tolerated and begin using tube tomorrow morning ? ? ?Cono Gebhard, MD ?Pager: [phone] ? ? ? ?

## 2021-05-21 NOTE — Progress Notes (Addendum)
NAME:  Vincent [name], MRN:  [id number], DOB:  May 13, 1971, LOS: 22 ADMISSION DATE:  04/29/2021, CONSULTATION DATE:  04/29/21 REFERRING MD:  Dr. Jacqulyn Bath, CHIEF COMPLAINT:  Found Down   History of present illness   50 yo male smoker admitted with PEA cardiac arrest with ROSC after 6 minutes.  CT head showed old infarct. CT chest showed b/l dense lower lobe consolidation with changes of emphysema. UDS positive for benzodiazepines and THC.  Past Medical History  Emphysema, CVA, DM type 2  Significant Hospital Events   8/2: PEA Arrest, King airway in the field, CPR x6 minutes and epi x1, ROSC, Intubated in ED, Admitted to PCCM  8/4: Weaned off TTM, Coox O2 sat of 47%, started on milrinone and lasix 8/5 milrinone increased 8/6 TF stopped, OG placed to suction for ileus 8/8 Post pyloric Cortrak placed, myoclonic jerking, started Keppra, depakote 8/10 Completed antibiotic course for pneumonia. Family meeting transitioned to DNR. 8/15 Tracheostomy  8/18 Febrile overnight, started Zosyn for aspiration pneumonia 8/22 Completed Abx course for aspiration pneumonia  8/24 PEG placed  Interim history/subjective:  Consented for PEG yesterday. No other significant events.   Objective   Blood pressure 98/71, pulse 89, temperature 98.2 F (36.8 C), temperature source Axillary, resp. rate (!) 24, height 5\' 8"  (1.727 m), weight 64.7 kg, SpO2 100 %.    Vent Mode: PRVC FiO2 (%):  [40 %] 40 % Set Rate:  [14 bmp] 14 bmp Vt Set:  [550 mL] 550 mL PEEP:  [8 cmH20] 8 cmH20 Pressure Support:  [8 cmH20] 8 cmH20 Plateau Pressure:  [16 cmH20-18 cmH20] 18 cmH20   Intake/Output Summary (Last 24 hours) at 05/21/2021 0820 Last data filed at 05/21/2021 0300 Gross per 24 hour  Intake 1384.97 ml  Output 730 ml  Net 654.97 ml    Filed Weights   05/19/21 0500 05/20/21 0500 05/21/21 0500  Weight: 64.7 kg 64.7 kg 64.7 kg   General:   Middle aged male on vent via trach Neuro:  Unresponsive, occasional myoclonic  jerking in bilateral lower extremities.  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 Myoclonic jerking - Continue Keppra for myoclonus. Started 8/17. Can uptitrate by 1000 mg daily every 2 weeks if necessary.  - Klonopin PRN - Consider adding valproic acid  Acute Systolic/Diastolic CHF after Cardiac Arrest: - Continue lopressor, lisinopril - Even to negative fluid balance as tolerated  Acute Hypoxic Respiratory Failure from Cardiac Arrest and Aspiration Pneumonia. Failure to wean from ventilator s/p tracheostomy. Centrilobular emphysema. - pressure support wean as tolerated - Trach Care - PRN Duonebs - VAP Protocol  2.6 x 2.1 cm nodular density in Lt upper lobe abutting pleura. - seen on CT chest 04/29/21 - will need repeat CT imaging in September  DM type 2 poorly controlled with hyperglycemia. - Glucose well controlled without insulin. Will DC SSI and CBG  Anemia of critical illness. - f/u CBC intermittently - transfuse for Hb < 7  Dysphagia. - IR consulted for G tube placement, which should occur today.  Disposition. - CM/SW will begin to seek LTACH placement once G tube placed.   Best practice:  Diet/type: tube feeds DVT prophylaxis: SQ heparin GI prophylaxis: protonix Code Status: DNR Disposition: ICU  Critical care time 32 minutes   Joneen Roach, AGACNP-BC [facility]  See Amion for personal pager PCCM on call pager [phone] until 7pm. Please call Elink 7p-7a. 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 state History of right MCA CVA Myoclonus -Continue Keppra, will up titrate as needed, Klonopin as needed  Acute systolic/diastolic congestive heart failure -Continue lisinopril and Lopressor  Hypoxic respiratory failure Aspiration pneumonia -Continue trach care -Weaning as tolerated  Type 2 diabetes  Anemia critical illness  Dysphagia -For PEG placement today  Will continue to work on placement  The patient is critically ill with multiple organ systems failure and requires high complexity decision making for assessment and support, frequent evaluation and titration of therapies, application of advanced monitoring technologies and extensive interpretation of multiple databases. Critical Care Time devoted to patient care services described in this note independent of APP/resident time (if applicable)  is 30 minutes.   Virl Diamond MD [facility] Personal pager: See Amion If unanswered, please page CCM On-call: #[phone]

## 2021-05-21 NOTE — Progress Notes (Signed)
Transported patient to IR for PEG placement.

## 2021-05-21 NOTE — Sedation Documentation (Signed)
Patient transported to [facility] ICU with RRT. Floor RN sara at the bedside to receive report.

## 2021-05-22 ENCOUNTER — Inpatient Hospital Stay (HOSPITAL_COMMUNITY): Payer: Medicaid Other

## 2021-05-22 DIAGNOSIS — I469 Cardiac arrest, cause unspecified: Secondary | ICD-10-CM | POA: Diagnosis not present

## 2021-05-22 DIAGNOSIS — J9601 Acute respiratory failure with hypoxia: Secondary | ICD-10-CM | POA: Diagnosis not present

## 2021-05-22 DIAGNOSIS — R569 Unspecified convulsions: Secondary | ICD-10-CM | POA: Diagnosis not present

## 2021-05-22 DIAGNOSIS — G931 Anoxic brain damage, not elsewhere classified: Secondary | ICD-10-CM | POA: Diagnosis not present

## 2021-05-22 DIAGNOSIS — Z930 Tracheostomy status: Secondary | ICD-10-CM

## 2021-05-22 LAB — GLUCOSE, CAPILLARY
Glucose-Capillary: 110 mg/dL — ABNORMAL HIGH (ref 70–99)
Glucose-Capillary: 124 mg/dL — ABNORMAL HIGH (ref 70–99)
Glucose-Capillary: 129 mg/dL — ABNORMAL HIGH (ref 70–99)
Glucose-Capillary: 136 mg/dL — ABNORMAL HIGH (ref 70–99)
Glucose-Capillary: 96 mg/dL (ref 70–99)

## 2021-05-22 LAB — BASIC METABOLIC PANEL
Anion gap: 14 (ref 5–15)
Anion gap: 12 (ref 5–15)
BUN: 44 mg/dL — ABNORMAL HIGH (ref 6–20)
BUN: 48 mg/dL — ABNORMAL HIGH (ref 6–20)
CO2: 20 mmol/L — ABNORMAL LOW (ref 22–32)
CO2: 22 mmol/L (ref 22–32)
Calcium: 10 mg/dL (ref 8.9–10.3)
Calcium: 10.2 mg/dL (ref 8.9–10.3)
Chloride: 109 mmol/L (ref 98–111)
Chloride: 105 mmol/L (ref 98–111)
Creatinine, Ser: 1.14 mg/dL (ref 0.61–1.24)
Creatinine, Ser: 1.32 mg/dL — ABNORMAL HIGH (ref 0.61–1.24)
GFR, Estimated: >60 mL/min (ref >60)
GFR, Estimated: >60 mL/min (ref >60)
Glucose, Bld: 110 mg/dL — ABNORMAL HIGH (ref 70–99)
Glucose, Bld: 113 mg/dL — ABNORMAL HIGH (ref 70–99)
Potassium: 4.7 mmol/L (ref 3.5–5.1)
Potassium: 4.7 mmol/L (ref 3.5–5.1)
Sodium: 143 mmol/L (ref 135–145)
Sodium: 139 mmol/L (ref 135–145)

## 2021-05-22 LAB — CBC
HCT: 38.7 % — ABNORMAL LOW (ref 39.0–52.0)
Hemoglobin: 12.6 g/dL — ABNORMAL LOW (ref 13.0–17.0)
MCH: 28.7 pg (ref 26.0–34.0)
MCHC: 32.6 g/dL (ref 30.0–36.0)
MCV: 88.2 fL (ref 80.0–100.0)
Platelets: 655 10*3/uL — ABNORMAL HIGH (ref 150–400)
RBC: 4.39 MIL/uL (ref 4.22–5.81)
RDW: 14.1 % (ref 11.5–15.5)
WBC: 9.9 10*3/uL (ref 4.0–10.5)
nRBC: 0 % (ref 0.0–0.2)

## 2021-05-22 LAB — MAGNESIUM: Magnesium: 2.3 mg/dL (ref 1.7–2.4)

## 2021-05-22 LAB — PROCALCITONIN: Procalcitonin: <0.1 ng/mL

## 2021-05-22 MED ORDER — PROSOURCE TF PO LIQD
45.0000 mL | Freq: Two times a day (BID) | ORAL | Status: DC
  Administered 2021-05-22 – 2021-06-18 (×44): 45 mL
  Filled 2021-05-22 (×55): qty 45

## 2021-05-22 MED ORDER — IOHEXOL 350 MG/ML SOLN
75.0000 mL | Freq: Once | INTRAVENOUS | Status: AC | PRN
  Administered 2021-05-22: 75 mL via INTRAVENOUS

## 2021-05-22 MED ORDER — IOHEXOL 9 MG/ML PO SOLN
500.0000 mL | ORAL | Status: AC
  Administered 2021-05-22 (×2): 500 mL via ORAL

## 2021-05-22 MED ORDER — JEVITY 1.2 CAL PO LIQD
1000.0000 mL | ORAL | Status: DC
  Administered 2021-05-23 – 2021-06-11 (×22): 1000 mL
  Filled 2021-05-22 (×52): qty 1000

## 2021-05-22 MED ORDER — PIPERACILLIN-TAZOBACTAM 3.375 G IVPB
3.3750 g | Freq: Three times a day (TID) | INTRAVENOUS | Status: DC
  Administered 2021-05-22 – 2021-05-23 (×2): 3.375 g via INTRAVENOUS
  Filled 2021-05-22 (×3): qty 50

## 2021-05-22 MED ORDER — FLUCONAZOLE 100 MG PO TABS
100.0000 mg | ORAL_TABLET | Freq: Every day | ORAL | Status: AC
  Administered 2021-05-23 – 2021-05-31 (×9): 100 mg
  Filled 2021-05-22 (×9): qty 1

## 2021-05-22 MED ORDER — FLUCONAZOLE 200 MG PO TABS
200.0000 mg | ORAL_TABLET | Freq: Once | ORAL | Status: AC
  Administered 2021-05-22: 200 mg
  Filled 2021-05-22: qty 1

## 2021-05-22 MED ORDER — CLONAZEPAM 1 MG PO TABS
1.0000 mg | ORAL_TABLET | Freq: Three times a day (TID) | ORAL | Status: DC | PRN
  Administered 2021-05-22 – 2021-05-28 (×13): 1 mg
  Filled 2021-05-22 (×13): qty 1

## 2021-05-22 MED ORDER — FLUCONAZOLE 100 MG PO TABS: 100.0000 mg | ORAL_TABLET | Freq: Every day | ORAL | Status: DC

## 2021-05-22 MED ORDER — PIPERACILLIN-TAZOBACTAM 3.375 G IVPB 30 MIN
3.3750 g | Freq: Once | INTRAVENOUS | Status: AC
  Administered 2021-05-22: 3.375 g via INTRAVENOUS
  Filled 2021-05-22: qty 50

## 2021-05-22 NOTE — Progress Notes (Signed)
Patient transported to CT and back without complications. RN at bedside.  

## 2021-05-22 NOTE — Progress Notes (Signed)
Nutrition Follow-up  DOCUMENTATION CODES:   Not applicable  INTERVENTION:   Continue tube feeding via Cortrak: - Jevity 1.2 increase to 75 ml/hr (1800 ml/day) - ProSource TF 45 ml BID - Free water flushes of 100 ml q 8 hours, CCM to adjust as needed  Tube feeding regimen provides 2240 kcal, 122 grams of protein, and 1458 ml free water.  Total free water with flushes: 1758 ml  NUTRITION DIAGNOSIS:   Inadequate oral intake related to inability to eat as evidenced by NPO status.  Ongoing  GOAL:   Patient will meet greater than or equal to 90% of their needs  Met via TF  MONITOR:   Vent status, Labs, TF tolerance  REASON FOR ASSESSMENT:   Ventilator, Consult Enteral/tube feeding initiation and management  ASSESSMENT:   50 yo male admitted S/P PEA cardiac arrest at home. PMH includes tobacco abuse, emphysema, CVA, diabetes.  8/15 - s/p trach 8/24 - s/p PEG  TF formula changed last week to Jevity 1.2 to increase fiber intake to bulk stool. Last BM documented on 8/22, type 7 via rectal tube. Rectal tube has been removed. Abdomen distended this AM, laxative given.   Current TF via PEG: Jevity 1.2 at 60 ml/hr, Prosource TF 45 ml QID, free water flushes 100 ml q 8 hours  Patient remains on ventilator support via trach MV: 7.7 L/min Temp (24hrs), Avg:100.7 F (38.2 C), Min:99.1 F (37.3 C), Max:102.1 F (38.9 C)  Medications reviewed and include: Colace, nutrisource fiber BID, Keppra, protonix.  Labs reviewed. CBG: 110-96  UOP: 1117 ml x 24 hours I/O's: +17 L since admit  Admit weight: 70 kg Current weight: 62.9 kg   Diet Order:   Diet Order     None       EDUCATION NEEDS:   Not appropriate for education at this time  Skin:  Skin Assessment: Reviewed RN Assessment  Last BM:  8/22 type 7 rectal tube  Height:   Ht Readings from Last 1 Encounters:  04/29/21 5' 8"  (1.727 m)    Weight:   Wt Readings from Last 1 Encounters:  05/22/21 62.9 kg     Ideal Body Weight:  70 kg  BMI:  Body mass index is 21.08 kg/m.  Estimated Nutritional Needs:   Kcal:  2000-2200  Protein:  110-125 gm  Fluid:  >/= 2 L   Lucas Mallow, RD, LDN, CNSC Please refer to Amion for contact information.

## 2021-05-22 NOTE — Progress Notes (Signed)
Supervising Physician: Richarda Overlie  Patient Status:  [facility] - In-pt  Chief Complaint: Pt is a 50 yo male admitted to ICU post PEA cardiac arrest, CPR x6 minutes. He has PMH of emphysema, CVA, DM II, CVA, and tobacco abuse. G-tube placed 05/21/21 by Dr. Elby Showers   Subjective:  Pt vented and sedated. Afebrile. HR 103.  Gastrostomy tube in place w/ scant amt red blood around tube entrance w/ no active bleeding. 2 T-tack sutures in places Otherwise, site is C/D/I with no leaking. No redness or other s/sx of infection noted.  RN reports that pt has been receiving tube feeds today and G-tube flushed easily with no leaking.   Allergies: Patient has no known allergies.  Medications: Prior to Admission medications   Medication Sig Start Date End Date Taking? Authorizing Provider  amLODipine (NORVASC) 5 MG tablet Take 5 mg by mouth daily. 04/09/21  Yes [provider]  atorvastatin (LIPITOR) 40 MG tablet Take 40 mg by mouth at bedtime. 04/09/21  Yes [provider]     Vital Signs: BP (!) 88/60   Pulse (!) 103   Temp (!) 100.4 F (38 C) (Oral)   Resp 14   Ht 5\' 8"  (1.727 m)   Wt 138 lb 10.7 oz (62.9 kg)   SpO2 100%   BMI 21.08 kg/m   Physical Exam  Imaging: IR GASTROSTOMY TUBE MOD SED  Result Date: 05/21/2021 INDICATION: 50 year old male status post cardiac arrest with tracheostomy in place. EXAM: PERC PLACEMENT GASTROSTOMY MEDICATIONS: Ancef 2 gm IV; Antibiotics were administered within 1 hour of the procedure. Glucagon 1 mg IV ANESTHESIA/SEDATION: Versed 1.5 mg IV; Fentanyl 75 mcg IV Moderate Sedation Time:  13 The patient was continuously monitored during the procedure by the interventional radiology nurse under my direct supervision. CONTRAST:  20 mL Omnipaque 300-administered into the gastric lumen. FLUOROSCOPY TIME:  Fluoroscopy Time: 0 minutes 24 seconds (9 mGy). COMPLICATIONS: None immediate. PROCEDURE: Informed written consent was obtained from the  patient after a thorough discussion of the procedural risks, benefits and alternatives. All questions were addressed. Maximal Sterile Barrier Technique was utilized including caps, mask, sterile gowns, sterile gloves, sterile drape, hand hygiene and skin antiseptic. A timeout was performed prior to the initiation of the procedure. The patient was placed on the procedure table in the supine position. Pre-procedure abdominal film confirmed visualization of the transverse colon. The patient was prepped and draped in usual sterile fashion. The stomach was insufflated with air via the indwelling nasoenteric tube. Under fluoroscopy, a puncture site was selected and local analgesia achieved with 1% lidocaine infiltrated subcutaneously. Under fluoroscopic guidance, a gastropexy needle was passed into the stomach and the T-bar suture was released. Entry into the stomach was confirmed with fluoroscopy, aspiration of air, and injection of contrast material. This was repeated with an additional gastropexy suture (for a total of 2 fasteners). At the center of these gastropexy sutures, a dermatotomy was performed. An 18 gauge needle was passed into the stomach at the site of this dermatotomy, and position within the gastric lumen again confirmed under fluoroscopy using aspiration of air and contrast injection. An Amplatz guidewire was passed through this needle and intraluminal placement within the stomach was confirmed by fluoroscopy. The needle was removed. Over the guidewire, the percutaneous tract was dilated using a 10 mm non-compliant balloon. The balloon was deflated, then pushed into the gastric lumen followed in concert by the 20 Fr gastrostomy tube. The retention balloon of the  percutaneous gastrostomy tube was inflated with 10 mL of sterile water. The tube was withdrawn until the retention balloon was at the edge of the gastric lumen. The external bumper was brought to the abdominal wall. Contrast was injected through  the gastrostomy tube, confirming intraluminal positioning. The patient tolerated the procedure well without any immediate post-procedural complications. IMPRESSION: Technically successful placement of 20 Fr gastrostomy tube. Marliss Coots, MD [facility] Radiology Electronically Signed   By: Marliss Coots M.D.   On: 05/21/2021 12:54    Labs:  CBC: Recent Labs    05/17/21 0036 05/19/21 0904 05/21/21 0244 05/22/21 0126  WBC 9.9 10.2 9.2 9.9  HGB 10.2* 10.9* 10.8* 12.6*  HCT 32.0* 34.7* 33.7* 38.7*  PLT 559* 644* 556* 655*    COAGS: Recent Labs    04/30/21 0022 05/21/21 0244  INR 1.2 1.1  APTT 33  --     BMP: Recent Labs    05/18/21 0332 05/19/21 0904 05/21/21 0244 05/22/21 0126  NA 143 141 142 143  K 4.4 4.5 4.4 4.7  CL 107 107 109 109  CO2 24 24 24  20*  GLUCOSE 138* 129* 107* 110*  BUN 39* 33* 41* 44*  CALCIUM 9.7 9.5 9.5 10.0  CREATININE 1.02 0.95 1.00 1.14  GFRNONAA >60 >60 >60 >60    LIVER FUNCTION TESTS: Recent Labs    04/29/21 2040 05/21/21 0244  BILITOT 0.9 0.4  AST 34 46*  ALT 31 144*  ALKPHOS 49 68  PROT 5.7* 7.4  ALBUMIN 3.0* 3.2*    Assessment and Plan:  Pt is a 50 yo male admitted to ICU post PEA cardiac arrest, CPR x6 minutes. He has PMH of emphysema, CVA, DM II, CVA, and tobacco abuse. G-tube placed 05/21/21 by Dr. 05/23/21  Subjective:  Pt vented and sedated. Afebrile. HR 103.  Gastrostomy tube in place w/ scant amt red blood around tube entrance w/ no active bleeding. 2 T-tack sutures in places Otherwise, site is C/D/I with no leaking. No redness or other s/sx of infection noted.  RN reports that pt has been receiving tube feeds today and G-tube flushed easily with no leaking.   WBC WNL  Abd XR today: IMPRESSION: No acute findings. If there is clinical concern for free air, CT abdomen pelvis is recommended.   IR will continue to follow as needed. Please contact IR for any concerns.     Electronically Signed: Elby Showers, NP 05/22/2021, 11:31 AM   I spent a total of 15 Minutes at the the patient's bedside AND on the patient's hospital floor or unit, greater than 50% of which was counseling/coordinating care for gastrostomy tube.

## 2021-05-22 NOTE — Progress Notes (Addendum)
NAME:  Vincent [name], MRN:  [id number], DOB:  [birth year], LOS: 23 ADMISSION DATE:  04/29/2021, CONSULTATION DATE:  04/29/21 REFERRING MD:  Dr. Jacqulyn Bath, CHIEF COMPLAINT:  Found Down   History of present illness   50 yo male smoker admitted with PEA cardiac arrest with ROSC after 6 minutes.  CT head showed old infarct. CT chest showed b/l dense lower lobe consolidation with changes of emphysema. UDS positive for benzodiazepines and THC.  Past Medical History  Emphysema, CVA, DM type 2  Significant Hospital Events   8/2: PEA Arrest, King airway in the field, CPR x6 minutes and epi x1, ROSC, Intubated in ED, Admitted to PCCM  8/4: Weaned off TTM, Coox O2 sat of 47%, started on milrinone and lasix 8/5 milrinone increased 8/6 TF stopped, OG placed to suction for ileus 8/8 Post pyloric Cortrak placed, myoclonic jerking, started Keppra, depakote 8/10 Completed antibiotic course for pneumonia. Family meeting transitioned to DNR. 8/15 Tracheostomy  8/18 Febrile overnight, started Zosyn for aspiration pneumonia 8/22 Completed Abx course for aspiration pneumonia  8/24 PEG placed  Interim history/subjective:  PEG placed yesterday, uneventful. Fevers up to 102F overnight.   Objective   Blood pressure 135/89, pulse (!) 120, temperature (!) 100.4 F (38 C), temperature source Oral, resp. rate 14, height 5\' 8"  (1.727 m), weight 62.9 kg, SpO2 100 %.    Vent Mode: PRVC FiO2 (%):  [40 %-100 %] 40 % Set Rate:  [14 bmp] 14 bmp Vt Set:  [550 mL] 550 mL PEEP:  [5 cmH20] 5 cmH20 Plateau Pressure:  [14 cmH20-20 cmH20] 15 cmH20   Intake/Output Summary (Last 24 hours) at 05/22/2021 0817 Last data filed at 05/22/2021 0422 Gross per 24 hour  Intake --  Output 1074 ml  Net -1074 ml    Filed Weights   05/20/21 0500 05/21/21 0500 05/22/21 0418  Weight: 64.7 kg 64.7 kg 62.9 kg   General:   Middle aged male on vent. Diaphoretic Neuro:  eyes open, unresponsive.  HEENT:  [place]/AT, PERRL, no  JVD Cardiovascular:  Tachy, regular, no MRG Lungs:  Clear Abdomen:  Soft, mild distension. PEG site looks good.  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 Myoclonic jerking - Continue Keppra for myoclonus. Started 8/17. Can uptitrate by 1000 mg daily every 2 weeks if necessary.  - Klonopin PRN - Consider adding valproic acid   Acute Systolic/Diastolic CHF after Cardiac Arrest: - Continue lopressor, lisinopril - Even to negative fluid balance as tolerated  Acute Hypoxic Respiratory Failure from Cardiac Arrest and Aspiration Pneumonia. Failure to wean from ventilator s/p tracheostomy. Centrilobular emphysema. - Pressure support wean as tolerated - Trach Care - PRN Duonebs - VAP Protocol  2.6 x 2.1 cm nodular density in Lt upper lobe abutting pleura. - seen on CT chest 04/29/21 - will need repeat CT imaging in September  DM type 2 poorly controlled with hyperglycemia. - Glucose well controlled without insulin. Will DC SSI and CBG  Fever: - check CXR and KUB - Tylenol PRN - Consider initiating antibiotics.  Anemia of critical illness. - f/u CBC intermittently - transfuse for Hb < 7  Dysphagia. - G tube placed - Tube feeds  Disposition. - CM/SW searching for [facility]  Best practice:  Diet/type: tube feeds  DVT prophylaxis: SQ heparin GI prophylaxis: protonix Code Status: DNR Disposition: ICU  Critical care time 33 minutes   Joneen Roach, AGACNP-BC [place] [facility]  See Amion for personal pager PCCM on call pager [phone] until 7pm. Please call Elink 7p-7a. [phone]  05/22/2021 8:17 AM

## 2021-05-22 NOTE — TOC Progression Note (Signed)
Transition of Care [facility]) - Progression Note    Patient Details  Name: Vincent [name] MRN: [id number] Date of Birth: Aug 30, 1971  Transition of Care [facility]) CM/SW Contact  Ralene Bathe, LCSWA Phone Number: 05/22/2021, 10:20 AM  Clinical Narrative:    CSW spoke with Irving Burton at [facility].  Insurance authorization is still pending.      Expected Discharge Plan: Skilled Nursing Facility (Resides with sister ( W/C bound)) Barriers to Discharge: Vent Bed not available  Expected Discharge Plan and Services Expected Discharge Plan: Skilled Nursing Facility (Resides with sister ( W/C bound))   Discharge Planning Services: CM Consult                                           Social Determinants of Health (SDOH) Interventions    Readmission Risk Interventions No flowsheet data found.

## 2021-05-22 NOTE — Progress Notes (Signed)
Pharmacy Antibiotic Note  Vincent [name] is a 50 y.o. male admitted on 04/29/2021 with sepsis for which the patient completed a full course of abx. Pt also completed 5d abx therapy for possible aspiration PNA on 8/22. Patient went to IR for PEG placement 8/24, and developed a fever overnight (Tm 102.1). Patient is also diaphoretic, however WBC WNL. CXR 8/25 shows suspicion for large volume pneumopertioneum in the abdomen. Pharmacy has been consulted for Zosyn dosing.   Plan: START Zosyn 3.375 g IV once (infuse over 30 min)  FOLLOWED by Zosyn 3.375 g IV Q8H  -Monitor renal function, clinical status, and antibiotic plan - F/U CT abdomen   Height: 5\' 8"  (172.7 cm) Weight: 62.9 kg (138 lb 10.7 oz) IBW/kg (Calculated) : 68.4  Temp (24hrs), Avg:100.4 F (38 C), Min:97.8 F (36.6 C), Max:102.1 F (38.9 C)  Recent Labs  Lab 05/16/21 0209 05/17/21 0036 05/18/21 0332 05/19/21 0904 05/21/21 0244 05/22/21 0126  WBC 12.9* 9.9  --  10.2 9.2 9.9  CREATININE 1.16 1.04 1.02 0.95 1.00 1.14     Estimated Creatinine Clearance: 68.2 mL/min (by C-G formula based on SCr of 1.14 mg/dL).    No Known Allergies  Antimicrobials this admission: Cefepime 8/3 x1   Vanc 8/3 x1 Azithromycin 8/3 x1  Ceftriaxone 8/3>>8/7  Augmentin 8/8>>8/9  Zosyn 8/18>> 8/19 Unasyn 8/19>>8/22 Zosyn 8/25>>   Dose adjustments this admission: N/A  Microbiology results: 8/2 MRSA PCR negative  8/18 Sputum: staph epi - likely contaminant  Thank you for allowing pharmacy to participate in this patient's care.  9/18, PharmD PGY-1 Acute Care Resident  05/22/2021 1:23 PM

## 2021-05-23 ENCOUNTER — Inpatient Hospital Stay (HOSPITAL_COMMUNITY): Payer: Medicaid Other

## 2021-05-23 DIAGNOSIS — I469 Cardiac arrest, cause unspecified: Secondary | ICD-10-CM | POA: Diagnosis not present

## 2021-05-23 DIAGNOSIS — R509 Fever, unspecified: Secondary | ICD-10-CM

## 2021-05-23 DIAGNOSIS — Z7401 Bed confinement status: Secondary | ICD-10-CM

## 2021-05-23 DIAGNOSIS — J9601 Acute respiratory failure with hypoxia: Secondary | ICD-10-CM | POA: Diagnosis not present

## 2021-05-23 DIAGNOSIS — Z930 Tracheostomy status: Secondary | ICD-10-CM | POA: Diagnosis not present

## 2021-05-23 DIAGNOSIS — Z7189 Other specified counseling: Secondary | ICD-10-CM

## 2021-05-23 DIAGNOSIS — G931 Anoxic brain damage, not elsewhere classified: Secondary | ICD-10-CM | POA: Diagnosis not present

## 2021-05-23 LAB — PROCALCITONIN: Procalcitonin: <0.1 ng/mL

## 2021-05-23 LAB — GLUCOSE, CAPILLARY: Glucose-Capillary: 126 mg/dL — ABNORMAL HIGH (ref 70–99)

## 2021-05-23 LAB — BASIC METABOLIC PANEL
Anion gap: 12 (ref 5–15)
BUN: 43 mg/dL — ABNORMAL HIGH (ref 6–20)
CO2: 23 mmol/L (ref 22–32)
Calcium: 10.2 mg/dL (ref 8.9–10.3)
Chloride: 105 mmol/L (ref 98–111)
Creatinine, Ser: 1.18 mg/dL (ref 0.61–1.24)
GFR, Estimated: >60 mL/min (ref >60)
Glucose, Bld: 135 mg/dL — ABNORMAL HIGH (ref 70–99)
Potassium: 4 mmol/L (ref 3.5–5.1)
Sodium: 140 mmol/L (ref 135–145)

## 2021-05-23 LAB — CBC
HCT: 40.1 % (ref 39.0–52.0)
Hemoglobin: 12.8 g/dL — ABNORMAL LOW (ref 13.0–17.0)
MCH: 28.4 pg (ref 26.0–34.0)
MCHC: 31.9 g/dL (ref 30.0–36.0)
MCV: 88.9 fL (ref 80.0–100.0)
Platelets: 624 10*3/uL — ABNORMAL HIGH (ref 150–400)
RBC: 4.51 MIL/uL (ref 4.22–5.81)
RDW: 14.2 % (ref 11.5–15.5)
WBC: 11.7 10*3/uL — ABNORMAL HIGH (ref 4.0–10.5)
nRBC: 0 % (ref 0.0–0.2)

## 2021-05-23 LAB — MAGNESIUM: Magnesium: 2.5 mg/dL — ABNORMAL HIGH (ref 1.7–2.4)

## 2021-05-23 LAB — PHOSPHORUS: Phosphorus: 5.7 mg/dL — ABNORMAL HIGH (ref 2.5–4.6)

## 2021-05-23 MED ORDER — FREE WATER
200.0000 mL | Freq: Four times a day (QID) | Status: DC
  Administered 2021-05-23 – 2021-05-27 (×15): 200 mL

## 2021-05-23 NOTE — Progress Notes (Addendum)
NAME:  Autumn Gunn, MRN:  [id number], DOB:  September 17, 1971, LOS: 24 ADMISSION DATE:  04/29/2021, CONSULTATION DATE:  04/29/21 REFERRING MD:  Dr. Jacqulyn Bath, CHIEF COMPLAINT:  Found Down   History of present illness   50 yo male smoker admitted with PEA cardiac arrest with ROSC after 6 minutes.  CT head showed old infarct. CT chest showed b/l dense lower lobe consolidation with changes of emphysema. UDS positive for benzodiazepines and THC.  Past Medical History  Emphysema, CVA, DM type 2  Significant Hospital Events   8/2: PEA Arrest, King airway in the field, CPR x6 minutes and epi x1, ROSC, Intubated in ED, Admitted to PCCM  8/4: Weaned off TTM, Coox O2 sat of 47%, started on milrinone and lasix 8/5 milrinone increased 8/6 TF stopped, OG placed to suction for ileus 8/8 Post pyloric Cortrak placed, myoclonic jerking, started Keppra, depakote 8/10 Completed antibiotic course for pneumonia. Family meeting transitioned to DNR. 8/15 Tracheostomy  8/18 Febrile overnight, started Zosyn for aspiration pneumonia 8/22 Completed Abx course for aspiration pneumonia  8/24 PEG placed 8/25: ABX started for fevers and ? Free air in abdomen. CT non-acute.   Interim history/subjective:  Fevers and diaphoretic yesterday. Started on empiric antibiotics after chest xray was worrisome for more free air than would be expected after perc g tube. CT with no evidence of bowel perforation.   Objective   Blood pressure 123/86, pulse 98, temperature 99.2 F (37.3 C), temperature source Axillary, resp. rate 15, height 5\' 8"  (1.727 m), weight 63 kg, SpO2 100 %.    Vent Mode: PRVC FiO2 (%):  [40 %] 40 % Set Rate:  [14 bmp] 14 bmp Vt Set:  [550 mL] 550 mL PEEP:  [5 cmH20] 5 cmH20 Plateau Pressure:  [13 cmH20-18 cmH20] 13 cmH20   Intake/Output Summary (Last 24 hours) at 05/23/2021 0957 Last data filed at 05/23/2021 0528 Gross per 24 hour  Intake 2819.72 ml  Output 1125 ml  Net 1694.72 ml    Filed Weights    05/21/21 0500 05/22/21 0418 05/23/21 0500  Weight: 64.7 kg 62.9 kg 63 kg    General:   Middle aged male on vent Neuro:  unresponsive despite painful stim HEENT:  [place]/AT, no JVD, Trach in place.  Cardiovascular:  Tachy, regular, no MRG Lungs:  Clear, synchronous Abdomen:  Soft, distended. PEG in place.  Musculoskeletal:  No acute deformity or edema Skin: Grossly intact   Resolved Hospital Problem list   Bowel Obstruction/ileus, Acute Kidney Injury from ATN 2nd to Cardiogenic shock  Assessment & Plan:   PEA arrest Acute encephalopathy: no overt evidence of anoxic injury on imaging, but he remains in what is essentially a vegetative state post arrest. Neurology suggests very slow if any recovery and unlikely return to an independent state of living. They have signed off. No clear metabolic cause of encephalopathy.  Hx of Rt MCA CVA. Myoclonic jerking - Continue Keppra for myoclonus. Started 8/17. Can uptitrate by 1000 mg daily every 2 weeks if necessary.  - Klonopin PRN - Consider adding valproic acid   Acute Systolic/Diastolic CHF after Cardiac Arrest: - Continue lopressor, lisinopril - Even to negative fluid balance as tolerated - may need to start diuresis, but difficult to truly measure output as he has had persistent diaphoresis. He is making good urine. Renal function is good, and he is not volume up on exam. Will hold off for now.   Acute Hypoxic Respiratory Failure from Cardiac Arrest and Aspiration Pneumonia. Failure to  wean from ventilator s/p tracheostomy. Centrilobular emphysema. - Pressure support wean as tolerated - Trach care - PRN Duonebs - VAP Protocol  2.6 x 2.1 cm nodular density in Lt upper lobe abutting pleura. - seen on CT chest 04/29/21 - will need repeat CT imaging in September  DM type 2 poorly controlled with hyperglycemia. - Glucose well controlled without insulin.  Fever: no clear infectious source on CT abdomen despite some abdominal  distension. PCT negative. Central fevers? - DC zosyn - Fluconazole for oral candidiasis through 9/4 - Doppler lower extremity to r/o DVT  Anemia of critical illness. - f/u CBC intermittently - transfuse for Hb < 7  Dysphagia. - G tube placed - Tube feeds  Disposition. - CM/SW searching for [facility]  Best practice:  Diet/type: tube feeds DVT prophylaxis: SQ heparin GI prophylaxis: protonix Code Status: DNR Disposition: ICU  Critical care time 36 minutes   Joneen Roach, AGACNP-BC [place] [facility]  See Amion for personal pager PCCM on call pager [phone] until 7pm. Please call Elink 7p-7a. [phone]  05/23/2021 9:57 AM

## 2021-05-23 NOTE — Progress Notes (Signed)
Nutrition Follow-up  DOCUMENTATION CODES:   Not applicable  INTERVENTION:   Continue tube feeding via Cortrak: Jevity 1.2 at 75 ml/hr (1800 ml/day) ProSource TF 45 ml BID  Increase free water flushes to 200 ml every 6 hours   Tube feeding regimen provides 2240 kcal, 122 grams of protein, and 1458 ml free water.  Total free water with flushes: 2258 ml  NUTRITION DIAGNOSIS:   Inadequate oral intake related to inability to eat as evidenced by NPO status.  Ongoing  GOAL:   Patient will meet greater than or equal to 90% of their needs  Met via TF  MONITOR:   Vent status, Labs, TF tolerance  REASON FOR ASSESSMENT:   Ventilator, Consult Enteral/tube feeding initiation and management  ASSESSMENT:   50 yo male admitted S/P PEA cardiac arrest at home. PMH includes tobacco abuse, emphysema, CVA, diabetes.  8/15 - s/p trach 8/24 - s/p PEG  Current TF via PEG: Jevity 1.2 at 75 ml/hr, Prosource TF 45 ml BID, free water flushes 100 ml q 8 hours  Concern for upward trend of phos and mag discussed with CCM. Will increase free water flushes to 200 ml QID to better meet fluid requirements. Continue to trend phos and mag levels.  Patient remains on ventilator support via trach MV: 15.2 L/min Temp (24hrs), Avg:99.2 F (37.3 C), Min:98.5 F (36.9 C), Max:100.7 F (38.2 C)  Medications reviewed and include: Colace, nutrisource fiber BID, Keppra, protonix.  Labs reviewed. Phos 5.7, mag 2.5 CBG: 126 this AM  UOP: 1125 ml x 24 hours I/O's: +19 L since admit  Admit weight: 70 kg Current weight: 63 kg   Diet Order:   Diet Order     None       EDUCATION NEEDS:   Not appropriate for education at this time  Skin:  Skin Assessment: Reviewed RN Assessment  Last BM:  8/26 type 7  Height:   Ht Readings from Last 1 Encounters:  04/29/21 _0  (1.727 m)    Weight:   Wt Readings from Last 1 Encounters:  05/23/21 63 kg    Ideal Body Weight:  70 kg  BMI:   Body mass index is 21.12 kg/m.  Estimated Nutritional Needs:   Kcal:  2000-2200  Protein:  110-125 gm  Fluid:  >/= 2 L   Vincent [name], RD, LDN, CNSC Please refer to Amion for contact information.

## 2021-05-23 NOTE — Consult Note (Signed)
Consultation Note Date: 05/23/2021   Patient Name: Vincent [name]  DOB: [birth year]  MRN: [id number]  Age / Sex: 50 y.o., male  PCP: Pcp, No Referring Physician: Audria Nine, DO  Reason for Consultation: Establishing goals of care  HPI/Patient Profile: 50 y.o. male  with past medical history of CVA in 2016, poorly controlled DM2, emphysema, and tobacco abuse admitted on 04/29/2021 with PEA arrest at home, likely respiratory driven.   Patient received CPR x6 minutes and epi x1, ROSC, intubated in the ED, admitted to [facility]. He developed acute systolic/diastolic CHF after cardiac arrest. Keppra and depakote were started for myoclonic jerking and patient remains encephalopathic. Currently s/p PEG/trach with moderate free air on 8/25 CT of the abdomen, likely due to PEG placement. Palliative medicine has been consulted to assist with goals of care conversation.  Clinical Assessment and Goals of Care:  I have reviewed medical records including EPIC notes, labs and imaging, received report from RN Shea Stakes, assessed the patient and then called patient's sister Vincent to discuss diagnosis, prognosis, GOC, EOL wishes, disposition and options.  I introduced Palliative Medicine as specialized medical care for people living with serious illness. It focuses on providing relief from the symptoms and stress of a serious illness. The goal is to improve quality of life for both the patient and the family.  We discussed a brief life review of the patient and then focused on their current illness. The natural disease trajectory and expectations at EOL were discussed. Maree Krabbe shares that the patient was previously living with his disabled sister and he was wheelchair-bound. Patient and these two sisters are the only surviving siblings of 10. He was married for 1 year before separating from his wife and the family currently has no  way of contacting her. He has no children. Patient has worked various jobs throughout his life, although he has not worked since his stroke in 2016. 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 She notes that he seemed depressed after his stroke "but still happy to be alive." He values his family, including many nieces and nephews, and his faith. Vincent states that "I know the outcome" in regards to patient's current illness. She shares that it is devastating and she realizes she can't change his situation. She understands that he is unlikely to have a neurological recovery, live independently, or have meaningful interactions with visitors. I emphasized patient's risk of recurrent infections, bed sores, and poor quality of life. Maree Krabbe confirms that this has been discussed with her as well.   I attempted to elicit values and goals of care important to the patient.   Patient and his family have never previously discussed his preferences and wishes for this type of situation. Vincent states her priority is ensuring that he is cared for in the best way possible. I shared that excellent care can be provided with a different focus based on each patient's goals and encouraged her to consider what Chinedu would want for himself. She is adamant  that she will not make any decisions to withdraw artificial support. She will let God's will decide when the patient will succumb to his illness.  The difference between aggressive medical intervention and comfort care was considered in light of the patient's goals of care.    Discussed the importance of continued conversation with family and the medical providers regarding overall plan of care and treatment options, ensuring decisions are within the context of the patient's values and GOCs.    Questions and concerns were addressed. The family was encouraged to call with questions or concerns.  PMT will continue to support holistically.    NEXT OF KIN  are patient's two sisters, although one has a disability. Vincent [name] is the primary contact. He is separated from his wife and there is no contact information available. No HCPOA on file.     SUMMARY OF RECOMMENDATIONS   -DNR -Continue full scope treatment -Patient's sister understands overall poor prognosis however, she is unable to make decisions for a comfort focused approach -Confirmed goal of LTACH placement  -Hard Choices booklet sent via email for review -Psychosocial and emotional support provided -PMT will continue to follow peripherally  Code Status/Advance Care Planning: Full code   Palliative Prophylaxis:  Eye Care, Oral Care, and Turn Reposition  Additional Recommendations (Limitations, Scope, Preferences): Full Comfort Care  Psycho-social/Spiritual:  Desire for further Chaplaincy support:TBD Additional Recommendations: Caregiving  Support/Resources, Compassionate Wean Education, and Education on Hospice  Prognosis:  Poor long-term prognosis with high risk for decline  Discharge Planning: LTACH      Primary Diagnoses: Present on Admission:  Cardiac arrest ([place])   I have reviewed the medical record, interviewed the patient and family, and examined the patient. The following aspects are pertinent.  No past medical history on file. Social History   Socioeconomic History   Marital status: Single    Spouse name: Not on file   Number of children: Not on file   Years of education: Not on file   Highest education level: Not on file  Occupational History   Not on file  Tobacco Use   Smoking status: Every Day    Types: Cigarettes   Smokeless tobacco: Never  Substance and Sexual Activity   Alcohol use: Not on file   Drug use: Not on file   Sexual activity: Not on file  Other Topics Concern   Not on file  Social History Narrative   Not on file   Social Determinants of Health   Financial Resource Strain: Not on file  Food Insecurity: Not on file   Transportation Needs: Not on file  Physical Activity: Not on file  Stress: Not on file  Social Connections: Not on file   Family History  Problem Relation Age of Onset   Coronary artery disease Father    Stroke Sister    Coronary artery disease Brother    Scheduled Meds:  atorvastatin  40 mg Per Tube QHS   chlorhexidine gluconate (MEDLINE KIT)  15 mL Mouth Rinse BID   Chlorhexidine Gluconate Cloth  6 each Topical Q0600   docusate  100 mg Per Tube BID   feeding supplement (PROSource TF)  45 mL Per Tube BID   fiber  1 packet Per Tube BID   fluconazole  100 mg Per Tube Daily   free water  200 mL Per Tube Q6H   heparin  5,000 Units Subcutaneous Q8H   levETIRAcetam  500 mg Per Tube BID   lisinopril  5 mg  Per Tube Daily   mouth rinse  15 mL Mouth Rinse 10 times per day   metoprolol tartrate  50 mg Per Tube Q8H   pantoprazole sodium  40 mg Per Tube QHS   sodium chloride flush  10-40 mL Intracatheter Q12H   Continuous Infusions:  sodium chloride Stopped (05/22/21 2003)   feeding supplement (JEVITY 1.2 CAL) 1,000 mL (05/23/21 0519)   PRN Meds:.acetaminophen (TYLENOL) oral liquid 160 mg/5 mL, clonazePAM, ipratropium-albuterol, oxyCODONE, polyethylene glycol, sodium chloride flush Medications Prior to Admission:  Prior to Admission medications   Medication Sig Start Date End Date Taking? Authorizing Provider  amLODipine (NORVASC) 5 MG tablet Take 5 mg by mouth daily. 04/09/21  Yes [provider]  atorvastatin (LIPITOR) 40 MG tablet Take 40 mg by mouth at bedtime. 04/09/21  Yes [provider]   No Known Allergies Review of Systems  Unable to perform ROS: Patient unresponsive   Physical Exam Vitals and nursing note reviewed.  Constitutional:      Appearance: He is ill-appearing.     Comments: Trach/vent  Cardiovascular:     Rate and Rhythm: Tachycardia present.  Pulmonary:     Effort: Tachypnea present.  Neurological:     Mental Status: He is unresponsive.     Vital Signs: BP (!) 143/112   Pulse (!) 107   Temp 98.7 F (37.1 C)   Resp (!) 25   Ht 5' 8" (1.727 m)   Wt 63 kg   SpO2 100%   BMI 21.12 kg/m  Pain Scale: CPOT   Pain Score: Asleep   SpO2: SpO2: 100 % O2 Device:SpO2: 100 % O2 Flow Rate: .O2 Flow Rate (L/min): 40 L/min  IO: Intake/output summary:  Intake/Output Summary (Last 24 hours) at 05/23/2021 1440 Last data filed at 05/23/2021 1200 Gross per 24 hour  Intake 1781 ml  Output 1375 ml  Net 406 ml    LBM: Last BM Date: 05/19/21 Baseline Weight: Weight: 70 kg Most recent weight: Weight: 63 kg     Palliative Assessment/Data: 10%     Time In: 1:00pm Time Out: 1:50pm  Time Total: 50 minutes Greater than 50% of this time was spent in counseling and coordinating care related to the above assessment and plan.  Dorthy Cooler, PA-C Palliative Medicine Team Team phone # [phone]  Thank you for allowing the Palliative Medicine Team to assist in the care of this patient. Please utilize secure chat with additional questions, if there is no response within 30 minutes please call the above phone number.  Palliative Medicine Team providers are available by phone from 7am to 7pm daily and can be reached through the team cell phone.  Should this patient require assistance outside of these hours, please call the patient's attending physician.

## 2021-05-23 NOTE — Progress Notes (Signed)
Lower extremity venous bilateral study completed.   Please see CV Proc for preliminary results.   Siah Kannan, RDMS, RVT  

## 2021-05-24 DIAGNOSIS — I469 Cardiac arrest, cause unspecified: Secondary | ICD-10-CM | POA: Diagnosis not present

## 2021-05-24 LAB — GLUCOSE, CAPILLARY
Glucose-Capillary: 144 mg/dL — ABNORMAL HIGH (ref 70–99)
Glucose-Capillary: 125 mg/dL — ABNORMAL HIGH (ref 70–99)
Glucose-Capillary: 163 mg/dL — ABNORMAL HIGH (ref 70–99)

## 2021-05-24 LAB — CBC WITH DIFFERENTIAL/PLATELET
Abs Immature Granulocytes: 0.07 10*3/uL (ref 0.00–0.07)
Basophils Absolute: 0.1 10*3/uL (ref 0.0–0.1)
Basophils Relative: 1 %
Eosinophils Absolute: 0.3 10*3/uL (ref 0.0–0.5)
Eosinophils Relative: 3 %
HCT: 43.1 % (ref 39.0–52.0)
Hemoglobin: 13.6 g/dL (ref 13.0–17.0)
Immature Granulocytes: 1 %
Lymphocytes Relative: 32 %
Lymphs Abs: 4.4 10*3/uL — ABNORMAL HIGH (ref 0.7–4.0)
MCH: 28.1 pg (ref 26.0–34.0)
MCHC: 31.6 g/dL (ref 30.0–36.0)
MCV: 89 fL (ref 80.0–100.0)
Monocytes Absolute: 1.5 10*3/uL — ABNORMAL HIGH (ref 0.1–1.0)
Monocytes Relative: 11 %
Neutro Abs: 7.5 10*3/uL (ref 1.7–7.7)
Neutrophils Relative %: 52 %
Platelets: 608 10*3/uL — ABNORMAL HIGH (ref 150–400)
RBC: 4.84 MIL/uL (ref 4.22–5.81)
RDW: 14.3 % (ref 11.5–15.5)
WBC: 13.9 10*3/uL — ABNORMAL HIGH (ref 4.0–10.5)
nRBC: 0 % (ref 0.0–0.2)

## 2021-05-24 LAB — PROCALCITONIN: Procalcitonin: <0.1 ng/mL

## 2021-05-24 LAB — BASIC METABOLIC PANEL
Anion gap: 10 (ref 5–15)
BUN: 36 mg/dL — ABNORMAL HIGH (ref 6–20)
CO2: 22 mmol/L (ref 22–32)
Calcium: 10 mg/dL (ref 8.9–10.3)
Chloride: 108 mmol/L (ref 98–111)
Creatinine, Ser: 1.01 mg/dL (ref 0.61–1.24)
GFR, Estimated: >60 mL/min (ref >60)
Glucose, Bld: 111 mg/dL — ABNORMAL HIGH (ref 70–99)
Potassium: 4.2 mmol/L (ref 3.5–5.1)
Sodium: 140 mmol/L (ref 135–145)

## 2021-05-24 LAB — PHOSPHORUS: Phosphorus: 4.4 mg/dL (ref 2.5–4.6)

## 2021-05-24 LAB — MAGNESIUM: Magnesium: 2.3 mg/dL (ref 1.7–2.4)

## 2021-05-24 NOTE — Progress Notes (Signed)
NAME:  Vincent [name], MRN:  [id number], DOB:  06/10/1971, LOS: 25 ADMISSION DATE:  04/29/2021, CONSULTATION DATE:  04/29/21 REFERRING MD:  Dr. Jacqulyn Bath, CHIEF COMPLAINT:  Found Down   History of present illness   50 yo male smoker admitted with PEA cardiac arrest with ROSC after 6 minutes.  CT head showed old infarct. CT chest showed b/l dense lower lobe consolidation with changes of emphysema. UDS positive for benzodiazepines and THC.  Past Medical History  Emphysema, CVA, DM type 2  Significant Hospital Events   8/2: PEA Arrest, [place] airway in the field, CPR x6 minutes and epi x1, ROSC, Intubated in ED, Admitted to PCCM  8/4: Weaned off TTM, [place] O2 sat of 47%, started on milrinone and lasix 8/5 milrinone increased 8/6 TF stopped, OG placed to suction for ileus 8/8 Post pyloric Cortrak placed, myoclonic jerking, started Keppra, depakote 8/10 Completed antibiotic course for pneumonia. Family meeting transitioned to DNR. 8/15 Tracheostomy  8/18 Febrile overnight, started Zosyn for aspiration pneumonia 8/22 Completed Abx course for aspiration pneumonia  8/24 PEG placed 8/25: ABX started for fevers and ? Free air in abdomen. CT non-acute.   Interim history/subjective:  8/27: no acute events. No change in neuro exam. Tmax 99.4   Objective   Blood pressure 133/72, pulse 92, temperature 99.2 F (37.3 C), temperature source Axillary, resp. rate (!) 33, height 5\' 8"  (1.727 m), weight 62.3 kg, SpO2 99 %.    Vent Mode: PRVC FiO2 (%):  [40 %] 40 % Set Rate:  [14 bmp] 14 bmp Vt Set:  [550 mL] 550 mL PEEP:  [5 cmH20] 5 cmH20 Plateau Pressure:  [14 cmH20-19 cmH20] 17 cmH20   Intake/Output Summary (Last 24 hours) at 05/24/2021 0904 Last data filed at 05/24/2021 0800 Gross per 24 hour  Intake 2215 ml  Output 600 ml  Net 1615 ml   Filed Weights   05/22/21 0418 05/23/21 0500 05/24/21 0500  Weight: 62.9 kg 63 kg 62.3 kg    General:   Middle aged male on vent Neuro:  unresponsive  despite painful stim HEENT:  [place]/AT, no JVD, Trach in place.  Cardiovascular:  Tachy, regular, no MRG Lungs:  Clear, synchronous Abdomen:  Soft, distended. PEG in place.  Musculoskeletal:  No acute deformity or edema Skin: Grossly intact   Resolved Hospital Problem list   Bowel Obstruction/ileus, Acute Kidney Injury from ATN 2nd to Cardiogenic shock  Assessment & Plan:   PEA arrest Acute encephalopathy: no overt evidence of anoxic injury on imaging, but he remains in what is essentially a vegetative state post arrest. Neurology suggests very slow if any recovery and unlikely return to an independent state of living. They have signed off. No clear metabolic cause of encephalopathy.  Hx of Rt MCA CVA. Myoclonic jerking - Continue Keppra for myoclonus. Started 8/17. Can uptitrate by 1000 mg daily every 2 weeks if necessary.  - Klonopin PRN - Consider adding valproic acid if worsens  Acute Systolic/Diastolic CHF after Cardiac Arrest: - Continue lopressor, lisinopril - Even to negative fluid balance as tolerated - may need to start diuresis, but difficult to truly measure output as he has had persistent diaphoresis. He is making good urine. Renal function is good, and he is not volume up on exam. Will hold off for now.   Acute Hypoxic Respiratory Failure from Cardiac Arrest and Aspiration Pneumonia. Failure to wean from ventilator s/p tracheostomy. Centrilobular emphysema. - Pressure support wean as tolerated - Trach care - PRN Duonebs -  VAP Protocol  2.6 x 2.1 cm nodular density in Lt upper lobe abutting pleura. - seen on CT chest 04/29/21 - will need repeat CT imaging in September  DM type 2 poorly controlled with hyperglycemia. - Glucose well controlled without insulin.  Fever: no clear infectious source on CT abdomen despite some abdominal distension. PCT negative. Central fevers? - DC zosyn - Fluconazole for oral candidiasis through 9/4 - venous doppler ble  negative  Anemia of critical illness. - f/u CBC intermittently - transfuse for Hb < 7  Dysphagia. - G tube placed - Tube feeds  Disposition. - CM/SW searching for LTACH  Best practice:  Diet/type: tube feeds DVT prophylaxis: SQ heparin GI prophylaxis: protonix Code Status: DNR Disposition: ICU  Critical care time: The patient is critically ill with multiple organ systems failure and requires high complexity decision making for assessment and support, frequent evaluation and titration of therapies, application of advanced monitoring technologies and extensive interpretation of multiple databases.  Critical care time 35 mins. This represents my time independent of the NPs time taking care of the pt. This is excluding procedures.    Briant Sites DO [facility] 05/24/2021, 9:04 AM See Amion for pager If no response to pager, please call [phone] until 1900 After 1900 please call [facility] [phone]

## 2021-05-25 DIAGNOSIS — I469 Cardiac arrest, cause unspecified: Secondary | ICD-10-CM | POA: Diagnosis not present

## 2021-05-25 LAB — BASIC METABOLIC PANEL
Anion gap: 11 (ref 5–15)
BUN: 37 mg/dL — ABNORMAL HIGH (ref 6–20)
CO2: 22 mmol/L (ref 22–32)
Calcium: 9.7 mg/dL (ref 8.9–10.3)
Chloride: 105 mmol/L (ref 98–111)
Creatinine, Ser: 0.98 mg/dL (ref 0.61–1.24)
GFR, Estimated: >60 mL/min (ref >60)
Glucose, Bld: 188 mg/dL — ABNORMAL HIGH (ref 70–99)
Potassium: 4.3 mmol/L (ref 3.5–5.1)
Sodium: 138 mmol/L (ref 135–145)

## 2021-05-25 MED ORDER — CHLORHEXIDINE GLUCONATE 0.12 % MT SOLN
OROMUCOSAL | Status: AC
  Filled 2021-05-25: qty 15

## 2021-05-25 NOTE — Progress Notes (Signed)
NAME:  Vincent [name], MRN:  [id number], DOB:  June 19, 1971, LOS: 26 ADMISSION DATE:  04/29/2021, CONSULTATION DATE:  04/29/21 REFERRING MD:  Dr. Jacqulyn Bath, CHIEF COMPLAINT:  Found Down   History of present illness   50 yo male smoker admitted with PEA cardiac arrest with ROSC after 6 minutes.  CT head showed old infarct. CT chest showed b/l dense lower lobe consolidation with changes of emphysema. UDS positive for benzodiazepines and THC.  Past Medical History  Emphysema, CVA, DM type 2  Significant Hospital Events   8/2: PEA Arrest, [place] airway in the field, CPR x6 minutes and epi x1, ROSC, Intubated in ED, Admitted to PCCM  8/4: Weaned off TTM, [place] O2 sat of 47%, started on milrinone and lasix 8/5 milrinone increased 8/6 TF stopped, OG placed to suction for ileus 8/8 Post pyloric Cortrak placed, myoclonic jerking, started Keppra, depakote 8/10 Completed antibiotic course for pneumonia. Family meeting transitioned to DNR. 8/15 Tracheostomy  8/18 Febrile overnight, started Zosyn for aspiration pneumonia 8/22 Completed Abx course for aspiration pneumonia  8/24 PEG placed 8/25: ABX started for fevers and ? Free air in abdomen. CT non-acute.   Interim history/subjective:  8/27: no acute events. No change in neuro exam. Tmax 99.4 8/28: no acute events reported overnight. No change in vent or neuro exam.    Objective   Blood pressure (!) 148/100, pulse 88, temperature 98.8 F (37.1 C), temperature source Axillary, resp. rate (!) 22, height 5\' 8"  (1.727 m), weight 61.7 kg, SpO2 100 %.    Vent Mode: PRVC FiO2 (%):  [40 %] 40 % Set Rate:  [14 bmp] 14 bmp Vt Set:  [550 mL] 550 mL PEEP:  [5 cmH20] 5 cmH20 Pressure Support:  [10 cmH20] 10 cmH20 Plateau Pressure:  [13 cmH20-19 cmH20] 15 cmH20   Intake/Output Summary (Last 24 hours) at 05/25/2021 1136 Last data filed at 05/25/2021 1001 Gross per 24 hour  Intake 4570 ml  Output 950 ml  Net 3620 ml   Filed Weights   05/23/21 0500  05/24/21 0500 05/25/21 0349  Weight: 63 kg 62.3 kg 61.7 kg    General:   Middle aged male on vent Neuro:  unresponsive despite painful stim HEENT:  [place]/AT, no JVD, Trach in place.  Cardiovascular:  Tachy, regular, no MRG Lungs:  Clear, synchronous Abdomen:  Soft, distended. PEG in place.  Musculoskeletal:  No acute deformity or edema Skin: Grossly intact   Resolved Hospital Problem list   Bowel Obstruction/ileus, Acute Kidney Injury from ATN 2nd to Cardiogenic shock  Assessment & Plan:   PEA arrest Acute encephalopathy: no overt evidence of anoxic injury on imaging, but he remains in what is essentially a vegetative state post arrest. Neurology suggests very slow if any recovery and unlikely return to an independent state of living. They have signed off. No clear metabolic cause of encephalopathy.  Hx of Rt MCA CVA. Myoclonic jerking - Continue Keppra for myoclonus. Started 8/17.  - Klonopin PRN  Acute Systolic/Diastolic CHF after Cardiac Arrest: - Continue lopressor, lisinopril - Even to negative fluid balance as tolerated   Acute Hypoxic Respiratory Failure from Cardiac Arrest and Aspiration Pneumonia. Failure to wean from ventilator s/p tracheostomy. Centrilobular emphysema. - Pressure support wean as tolerated - Trach care - PRN Duonebs - VAP Protocol  2.6 x 2.1 cm nodular density in Lt upper lobe abutting pleura. - seen on CT chest 04/29/21 - will need repeat CT imaging in September  DM type 2 poorly controlled with  hyperglycemia. - Glucose well controlled without insulin.  Fever: no clear infectious source on CT abdomen despite some abdominal distension. PCT negative. Central fevers? - DC zosyn - Fluconazole for oral candidiasis through 9/4 - venous doppler ble negative  Anemia of critical illness. - f/u CBC intermittently - transfuse for Hb < 7  Dysphagia. - G tube placed - Tube feeds  Disposition. - CM/SW searching for LTACH  Best practice:   Diet/type: tube feeds DVT prophylaxis: SQ heparin GI prophylaxis: protonix Code Status: DNR Disposition: ICU  Critical care time: The patient is critically ill with multiple organ systems failure and requires high complexity decision making for assessment and support, frequent evaluation and titration of therapies, application of advanced monitoring technologies and extensive interpretation of multiple databases.  Critical care time 32 mins. This represents my time independent of the NPs time taking care of the pt. This is excluding procedures.    Briant Sites DO [facility] 05/25/2021, 11:36 AM See Amion for pager If no response to pager, please call [phone] until 1900 After 1900 please call [facility] [phone]

## 2021-05-26 DIAGNOSIS — J9601 Acute respiratory failure with hypoxia: Secondary | ICD-10-CM | POA: Diagnosis not present

## 2021-05-26 DIAGNOSIS — I469 Cardiac arrest, cause unspecified: Secondary | ICD-10-CM | POA: Diagnosis not present

## 2021-05-26 LAB — BASIC METABOLIC PANEL
Anion gap: 12 (ref 5–15)
BUN: 35 mg/dL — ABNORMAL HIGH (ref 6–20)
CO2: 22 mmol/L (ref 22–32)
Calcium: 10.1 mg/dL (ref 8.9–10.3)
Chloride: 105 mmol/L (ref 98–111)
Creatinine, Ser: 0.92 mg/dL (ref 0.61–1.24)
GFR, Estimated: >60 mL/min (ref >60)
Glucose, Bld: 143 mg/dL — ABNORMAL HIGH (ref 70–99)
Potassium: 4.5 mmol/L (ref 3.5–5.1)
Sodium: 139 mmol/L (ref 135–145)

## 2021-05-26 MED ORDER — SPIRONOLACTONE 12.5 MG HALF TABLET
12.5000 mg | ORAL_TABLET | Freq: Every day | ORAL | Status: DC
  Filled 2021-05-26: qty 1

## 2021-05-26 MED ORDER — NYSTATIN 100000 UNIT/ML MT SUSP
5.0000 mL | Freq: Four times a day (QID) | OROMUCOSAL | Status: AC
  Administered 2021-05-26 (×2): 500000 [IU] via ORAL
  Filled 2021-05-26 (×2): qty 5

## 2021-05-26 MED ORDER — OXYCODONE HCL 5 MG PO TABS
5.0000 mg | ORAL_TABLET | Freq: Four times a day (QID) | ORAL | Status: DC | PRN
  Administered 2021-05-26 – 2021-05-29 (×7): 5 mg
  Filled 2021-05-26 (×7): qty 1

## 2021-05-26 NOTE — Progress Notes (Signed)
eLink Physician-Brief Progress Note Patient Name: Vincent [name] DOB: 1970/10/14 MRN: [id number]   Date of Service  05/26/2021  HPI/Events of Note  Asking for nystatin for whitish coating of tongue. Trach/peg.  eICU Interventions  To apply suspension over the tongue -spread thinly with asp precautions.      Intervention Category Intermediate Interventions: Other:  Ranee Gosselin 05/26/2021, 12:42 AM

## 2021-05-26 NOTE — Progress Notes (Signed)
eLink Physician-Brief Progress Note Patient Name: Vincent [name] DOB: [birth year] MRN: [id number]   Date of Service  05/26/2021  HPI/Events of Note  nurse sent update, pt has had a neuro status change, is now intermittently tracking, pupils are a 5 and sluggish.   Rt MCA  CVA.   eICU Interventions  Discused with RN, neuro status improving.   Continue care.      Intervention Category Intermediate Interventions: Change in mental status - evaluation and management  Ranee Gosselin 05/26/2021, 2:35 AM

## 2021-05-26 NOTE — Progress Notes (Signed)
NAME:  Vincent [name], MRN:  [id number], DOB:  01-02-1971, LOS: 27 ADMISSION DATE:  04/29/2021, CONSULTATION DATE:  04/29/21 REFERRING MD:  Dr. Jacqulyn Bath, CHIEF COMPLAINT:  Found Down   History of present illness   50 yo male smoker admitted with PEA cardiac arrest with ROSC after 6 minutes.  CT head showed old infarct. CT chest showed b/l dense lower lobe consolidation with changes of emphysema. UDS positive for benzodiazepines and THC.  Past Medical History  Emphysema, CVA, DM type 2  Significant Hospital Events   8/2: PEA Arrest, King airway in the field, CPR x6 minutes and epi x1, ROSC, Intubated in ED, Admitted to PCCM  8/4: Weaned off TTM, Coox O2 sat of 47%, started on milrinone and lasix 8/5 milrinone increased 8/6 TF stopped, OG placed to suction for ileus 8/8 Post pyloric Cortrak placed, myoclonic jerking, started Keppra, depakote 8/10 Completed antibiotic course for pneumonia. Family meeting transitioned to DNR. 8/15 Tracheostomy  8/18 Febrile overnight, started Zosyn for aspiration pneumonia 8/22 Completed Abx course for aspiration pneumonia  8/24 PEG placed 8/25: ABX started for fevers and ? Free air in abdomen. CT non-acute.   Interim history/subjective:  O/N: Sluggishly tracking with eyes, Asked for nystatin for whitish coating on tongue    Objective   Blood pressure 119/80, pulse 84, temperature 98.4 F (36.9 C), temperature source Oral, resp. rate 17, height 5\' 8"  (1.727 m), weight 65.3 kg, SpO2 100 %.    Vent Mode: PRVC FiO2 (%):  [40 %] 40 % Set Rate:  [14 bmp] 14 bmp Vt Set:  [550 mL] 550 mL PEEP:  [5 cmH20] 5 cmH20 Pressure Support:  [10 cmH20] 10 cmH20 Plateau Pressure:  [13 cmH20-22 cmH20] 15 cmH20   Intake/Output Summary (Last 24 hours) at 05/26/2021 0750 Last data filed at 05/26/2021 0600 Gross per 24 hour  Intake 1757.5 ml  Output 400 ml  Net 1357.5 ml   Filed Weights   05/24/21 0500 05/25/21 0349 05/26/21 0134  Weight: 62.3 kg 61.7 kg 65.3 kg     Physical Exam Constitutional:      General: He is not in acute distress.    Appearance: He is diaphoretic. He is not ill-appearing or toxic-appearing.     Comments: Trach, non communicative   Eyes:     General: No scleral icterus.       Right eye: No discharge.        Left eye: No discharge.     Conjunctiva/sclera: Conjunctivae normal.     Pupils: Pupils are equal, round, and reactive to light.  Cardiovascular:     Rate and Rhythm: Normal rate and regular rhythm.     Pulses: Normal pulses.     Heart sounds: Normal heart sounds. No murmur heard.   No friction rub. No gallop.  Pulmonary:     Breath sounds: No wheezing, rhonchi or rales.     Comments: Saturating well on vent.  Abdominal:     General: Abdomen is flat. Bowel sounds are normal.     Tenderness: There is no abdominal tenderness.  Musculoskeletal:     Right lower leg: No edema.     Left lower leg: No edema.  Skin:    General: Skin is warm.  Neurological:     Comments: No withdrawal from pain, tracking to the right and left with eye movements. Not following commands.       Resolved Hospital Problem list   Bowel Obstruction/ileus, Acute Kidney Injury from ATN 2nd to  Cardiogenic shock  Assessment & Plan:   PEA Arrest Acute Encephalopathy:  no overt evidence of anoxic injury on imaging, but he remains in what is essentially a vegetative state post arrest. Neurology suggests very slow if any recovery and unlikely return to an independent state of living. They have signed off. No clear metabolic cause of encephalopathy.   Hx of Rt MCA CVA Myoclonic Jerking: - Continue Keppra for myoclonus. Started 8/17. Can uptitrate by 1000 mg daily every 2 weeks if necessary.  - Klonopin PRN - Consider adding valproic acid if worsens  Acute Systolic/Diastolic CHF after Cardiac Arrest: - Continue lopressor, lisinopril - Even to negative fluid balance as tolerated - Currently appears to be euvolemic on examination,  diaphoretic this AM will not start lasix at this time.   Acute Hypoxic Respiratory Failure from Cardiac Arrest and Aspiration Pneumonia. Failure to wean from ventilator s/p tracheostomy. Centrilobular emphysema. - Pressure support wean as tolerated - Trach care - PRN Duonebs - VAP Protocol  2.6 x 2.1 cm nodular density in Lt upper lobe abutting pleura. - seen on CT chest 04/29/21 - will need repeat CT imaging in September  DM type 2 poorly controlled with hyperglycemia. - Glucose well controlled without insulin.  Fever:  No clear infectious source on CT abdomen despite some abdominal distension. PCT negative. No fevers Overnight.  - Fluconazole for oral candidiasis through 9/4 - venous doppler ble negative  Anemia of critical illness. - f/u CBC intermittently - transfuse for Hb < 7  Dysphagia. - G tube placed - Tube feeds  Disposition. - CM/SW searching for LTACH  Best practice:  Diet/type: tube feeds DVT prophylaxis: SQ heparin GI prophylaxis: protonix Code Status: DNR Disposition: ICU  Dolan Amen, MD IMTS, PGY-3 Pager: [phone] 05/26/2021,8:02 AM

## 2021-05-26 NOTE — Progress Notes (Signed)
Attending:    Subjective: Admitted with PEA arrest on 8/2, has been hospitalized since then Tracheostomy performed Has anoxic brain injury Has had cardiogenic shock this admission, required milrinone  Hasn't weaned from ventilator very well  Objective: Vitals:   05/26/21 0500 05/26/21 0600 05/26/21 0700 05/26/21 0806  BP: 115/79 119/80    Pulse: 91 84  97  Resp: (!) 29 17  (!) 25  Temp:   98.4 F (36.9 C)   TempSrc:   Oral   SpO2: 98% 100%  98%  Weight:      Height:       Vent Mode: PRVC FiO2 (%):  [40 %] 40 % Set Rate:  [14 bmp] 14 bmp Vt Set:  [550 mL] 550 mL PEEP:  [5 cmH20] 5 cmH20 Plateau Pressure:  [13 cmH20-22 cmH20] 17 cmH20  Intake/Output Summary (Last 24 hours) at 05/26/2021 0937 Last data filed at 05/26/2021 0600 Gross per 24 hour  Intake 1757.5 ml  Output 400 ml  Net 1357.5 ml    General:  In bed on vent HENT: NCAT ETT in place tongue swelling noted PULM: CTA B, vent supported breathing CV: RRR, no mgr GI: BS+, soft, nontender MSK: normal bulk and tone Neuro: sedated on vent    CBC    Component Value Date/Time   WBC 13.9 (H) 05/24/2021 0048   RBC 4.84 05/24/2021 0048   HGB 13.6 05/24/2021 0048   HCT 43.1 05/24/2021 0048   PLT 608 (H) 05/24/2021 0048   MCV 89.0 05/24/2021 0048   MCH 28.1 05/24/2021 0048   MCHC 31.6 05/24/2021 0048   RDW 14.3 05/24/2021 0048   LYMPHSABS 4.4 (H) 05/24/2021 0048   MONOABS 1.5 (H) 05/24/2021 0048   EOSABS 0.3 05/24/2021 0048   BASOSABS 0.1 05/24/2021 0048    BMET    Component Value Date/Time   NA 139 05/26/2021 0320   K 4.5 05/26/2021 0320   CL 105 05/26/2021 0320   CO2 22 05/26/2021 0320   GLUCOSE 143 (H) 05/26/2021 0320   BUN 35 (H) 05/26/2021 0320   CREATININE 0.92 05/26/2021 0320   CALCIUM 10.1 05/26/2021 0320   GFRNONAA >60 05/26/2021 0320    CXR images tracheostomy in place, ? Left lung infiltrate  Impression/Plan: Chronic respiratory failure with hypoxemia, complicated by systolic heart  failure and emphysema> continue weaning attempts, continue, keep net even, tracheostomy care per routine Anoxic brain injury with associated myoclonus> continue keppra and clonazepam Systolic heart failure > lisinopril, metoprolol Hyperlipidemia> atorvastatin   Awaiting placement.  My cc time 30 minutes  Heber [place], MD [place] [facility] Pager: [phone] Cell: [phone] After 7pm: [phone]

## 2021-05-26 NOTE — TOC Progression Note (Signed)
Transition of Care [facility]) - Progression Note    Patient Details  Name: Vincent [name] MRN: [id number] Date of Birth: 1971-01-10  [facility]) CM/SW Contact  Ralene Bathe, LCSWA Phone Number: 05/26/2021, 10:43 AM  Clinical Narrative:    CSW was contacted by Irving Burton with [facility].  The facility is still working with the insurance company [facility]) in attempts to try to admit the patient.    CSW will continue to follow.     Expected Discharge Plan: Skilled Nursing Facility (Resides with sister ( W/C bound)) Barriers to Discharge: Vent Bed not available  Expected Discharge Plan and Services Expected Discharge Plan: Skilled Nursing Facility (Resides with sister ( W/C bound))   Discharge Planning Services: CM Consult                                           Social Determinants of Health (SDOH) Interventions    Readmission Risk Interventions No flowsheet data found.

## 2021-05-26 NOTE — Plan of Care (Signed)

## 2021-05-27 ENCOUNTER — Other Ambulatory Visit (HOSPITAL_COMMUNITY): Payer: Self-pay

## 2021-05-27 ENCOUNTER — Inpatient Hospital Stay (HOSPITAL_COMMUNITY): Payer: Medicaid Other

## 2021-05-27 DIAGNOSIS — G931 Anoxic brain damage, not elsewhere classified: Secondary | ICD-10-CM | POA: Diagnosis not present

## 2021-05-27 DIAGNOSIS — J9601 Acute respiratory failure with hypoxia: Secondary | ICD-10-CM | POA: Diagnosis not present

## 2021-05-27 DIAGNOSIS — I469 Cardiac arrest, cause unspecified: Secondary | ICD-10-CM | POA: Diagnosis not present

## 2021-05-27 LAB — CBC WITH DIFFERENTIAL/PLATELET
Abs Immature Granulocytes: 0.15 10*3/uL — ABNORMAL HIGH (ref 0.00–0.07)
Basophils Absolute: 0.1 10*3/uL (ref 0.0–0.1)
Basophils Relative: 1 %
Eosinophils Absolute: 0.4 10*3/uL (ref 0.0–0.5)
Eosinophils Relative: 4 %
HCT: 41.6 % (ref 39.0–52.0)
Hemoglobin: 13.2 g/dL (ref 13.0–17.0)
Immature Granulocytes: 1 %
Lymphocytes Relative: 26 %
Lymphs Abs: 3 10*3/uL (ref 0.7–4.0)
MCH: 28 pg (ref 26.0–34.0)
MCHC: 31.7 g/dL (ref 30.0–36.0)
MCV: 88.3 fL (ref 80.0–100.0)
Monocytes Absolute: 0.7 10*3/uL (ref 0.1–1.0)
Monocytes Relative: 6 %
Neutro Abs: 7.1 10*3/uL (ref 1.7–7.7)
Neutrophils Relative %: 62 %
Platelets: 510 10*3/uL — ABNORMAL HIGH (ref 150–400)
RBC: 4.71 MIL/uL (ref 4.22–5.81)
RDW: 14 % (ref 11.5–15.5)
WBC: 11.4 10*3/uL — ABNORMAL HIGH (ref 4.0–10.5)
nRBC: 0 % (ref 0.0–0.2)

## 2021-05-27 LAB — COMPREHENSIVE METABOLIC PANEL
ALT: 83 U/L — ABNORMAL HIGH (ref 0–44)
AST: 40 U/L (ref 15–41)
Albumin: 3.3 g/dL — ABNORMAL LOW (ref 3.5–5.0)
Alkaline Phosphatase: 62 U/L (ref 38–126)
Anion gap: 13 (ref 5–15)
BUN: 39 mg/dL — ABNORMAL HIGH (ref 6–20)
CO2: 18 mmol/L — ABNORMAL LOW (ref 22–32)
Calcium: 9.6 mg/dL (ref 8.9–10.3)
Chloride: 103 mmol/L (ref 98–111)
Creatinine, Ser: 0.94 mg/dL (ref 0.61–1.24)
GFR, Estimated: >60 mL/min (ref >60)
Glucose, Bld: 176 mg/dL — ABNORMAL HIGH (ref 70–99)
Potassium: 4.9 mmol/L (ref 3.5–5.1)
Sodium: 134 mmol/L — ABNORMAL LOW (ref 135–145)
Total Bilirubin: <0.1 mg/dL — ABNORMAL LOW (ref 0.3–1.2)
Total Protein: 7.5 g/dL (ref 6.5–8.1)

## 2021-05-27 LAB — GLUCOSE, CAPILLARY
Glucose-Capillary: 166 mg/dL — ABNORMAL HIGH (ref 70–99)
Glucose-Capillary: 135 mg/dL — ABNORMAL HIGH (ref 70–99)
Glucose-Capillary: 181 mg/dL — ABNORMAL HIGH (ref 70–99)
Glucose-Capillary: 141 mg/dL — ABNORMAL HIGH (ref 70–99)

## 2021-05-27 MED ORDER — SPIRONOLACTONE 12.5 MG HALF TABLET
12.5000 mg | ORAL_TABLET | Freq: Every day | ORAL | Status: DC
  Administered 2021-05-27 – 2021-05-28 (×2): 12.5 mg
  Filled 2021-05-27 (×3): qty 1

## 2021-05-27 MED ORDER — FUROSEMIDE 10 MG/ML IJ SOLN
40.0000 mg | Freq: Once | INTRAMUSCULAR | Status: AC
  Administered 2021-05-27: 40 mg via INTRAVENOUS
  Filled 2021-05-27: qty 4

## 2021-05-27 MED ORDER — ARFORMOTEROL TARTRATE 15 MCG/2ML IN NEBU
15.0000 ug | INHALATION_SOLUTION | Freq: Two times a day (BID) | RESPIRATORY_TRACT | Status: DC
  Administered 2021-05-27 – 2021-07-22 (×110): 15 ug via RESPIRATORY_TRACT
  Filled 2021-05-27 (×115): qty 2

## 2021-05-27 MED ORDER — REVEFENACIN 175 MCG/3ML IN SOLN
175.0000 ug | Freq: Every day | RESPIRATORY_TRACT | Status: DC
  Administered 2021-05-27 – 2021-07-17 (×45): 175 ug via RESPIRATORY_TRACT
  Filled 2021-05-27 (×50): qty 3

## 2021-05-27 MED ORDER — LOSARTAN POTASSIUM 25 MG PO TABS
25.0000 mg | ORAL_TABLET | Freq: Every day | ORAL | Status: DC
  Administered 2021-05-27 – 2021-05-28 (×2): 25 mg
  Filled 2021-05-27 (×3): qty 1

## 2021-05-27 NOTE — TOC Benefit Eligibility Note (Signed)
Patient Product/process development scientist completed.    The patient is currently admitted and upon discharge could be taking Farxiga 10 mg.  Prior Authorization Required   The patient is currently admitted and upon discharge could be taking Jardiance 10 mg.  Prior Authorization Required   The patient is insured through [employer]     Vincent [name], CPhT Pharmacy Patient Advocate Specialist [place] Antimicrobial Stewardship Team Direct Number: [phone]  Fax: [phone]

## 2021-05-27 NOTE — TOC Benefit Eligibility Note (Signed)
Patient Advocate Encounter   Received notification that prior authorization for Farxiga 10 mg is required.   PA submitted on 05/27/2021 Key [id number] Status is pending       Roland Earl, CPhT Pharmacy Patient Advocate Specialist [facility] Antimicrobial Stewardship Team Direct Number: [phone]  Fax: [phone]

## 2021-05-27 NOTE — Progress Notes (Signed)
NAME:  Jabre Heo, MRN:  [id number], DOB:  11/10/1970, LOS: 28 ADMISSION DATE:  04/29/2021, CONSULTATION DATE:  04/29/21 REFERRING MD:  Dr. Jacqulyn Bath, CHIEF COMPLAINT:  Found Down   History of present illness   50 yo male smoker admitted with PEA cardiac arrest with ROSC after 6 minutes.  CT head showed old infarct. CT chest showed b/l dense lower lobe consolidation with changes of emphysema. UDS positive for benzodiazepines and THC.  Past Medical History  Emphysema, CVA, DM type 2  Significant Hospital Events   8/2: PEA Arrest, King airway in the field, CPR x6 minutes and epi x1, ROSC, Intubated in ED, Admitted to PCCM  8/4: Weaned off TTM, Coox O2 sat of 47%, started on milrinone and lasix 8/5 milrinone increased 8/6 TF stopped, OG placed to suction for ileus 8/8 Post pyloric Cortrak placed, myoclonic jerking, started Keppra, depakote 8/10 Completed antibiotic course for pneumonia. Family meeting transitioned to DNR. 8/15 Tracheostomy  8/18 Febrile overnight, started Zosyn for aspiration pneumonia 8/22 Completed Abx course for aspiration pneumonia  8/24 PEG placed 8/25: ABX started for fevers and ? Free air in abdomen. CT non-acute.   Interim history/subjective:  O/N Events: None   Minimally interactive.    Objective   Blood pressure 131/84, pulse 86, temperature 98.4 F (36.9 C), temperature source Oral, resp. rate 18, height 5\' 8"  (1.727 m), weight 66.3 kg, SpO2 100 %.    Vent Mode: PRVC FiO2 (%):  [40 %] 40 % Set Rate:  [14 bmp] 14 bmp Vt Set:  [550 mL] 550 mL PEEP:  [5 cmH20] 5 cmH20 Pressure Support:  [8 cmH20] 8 cmH20 Plateau Pressure:  [14 cmH20-17 cmH20] 16 cmH20   Intake/Output Summary (Last 24 hours) at 05/27/2021 0718 Last data filed at 05/27/2021 0700 Gross per 24 hour  Intake 2760 ml  Output 1250 ml  Net 1510 ml    Filed Weights   05/25/21 0349 05/26/21 0134 05/27/21 0500  Weight: 61.7 kg 65.3 kg 66.3 kg    Physical Exam Constitutional:       General: He is not in acute distress.    Appearance: He is diaphoretic.     Comments: Eyes opened, appears comfortable, NAD, diaphoretic  Cardiovascular:     Rate and Rhythm: Normal rate and regular rhythm.     Pulses: Normal pulses.     Heart sounds: Normal heart sounds. No murmur heard.   No friction rub. No gallop.  Pulmonary:     Effort: Pulmonary effort is normal. No respiratory distress.     Breath sounds: Normal breath sounds. No wheezing or rales.     Comments: Trach in place, no purulence or erythema noted Abdominal:     General: Bowel sounds are normal. There is no distension.     Tenderness: There is no abdominal tenderness.     Comments: PEG tube intact with no erythema, purulence, or drainage   Skin:    General: Skin is warm.     Findings: No erythema, lesion or rash.  Neurological:     Comments: Tracking with eyes to the left and right    Resolved Hospital Problem list   Bowel Obstruction/ileus, Acute Kidney Injury from ATN 2nd to Cardiogenic shock, Aspiration Pneumonia   Assessment & Plan:   PEA Arrest Acute Encephalopathy:  Neurology suggests very slow if any recovery and unlikely return to an independent state of living. They have signed off. No clear metabolic cause of encephalopathy.   Hx of Rt MCA  CVA Myoclonic Jerking: - Discontinue Keppra and continue to monitor for increased myoclonus.  - Continue Klonopin PRN  Acute Systolic/Diastolic CHF after Cardiac Arrest: - Continue lopressor 50 mg Q8H, lisinopril 5 mg QD, spironolactone 12.5 mg QD-  - CXR this AM showing some congestions, will give Lasix IV 40 mg once.    Acute Hypoxic Respiratory Failure from Cardiac Arrest and Aspiration Pneumonia. Failure to wean from ventilator s/p tracheostomy. Centrilobular Emphysema:  - Pressure support wean as tolerated - Trach care - PRN Duonebs - VAP Protocol - Start Yupelri 175 mcg QD - Start Brovana 15 mcg BID  DM type 2 poorly controlled with  hyperglycemia. - Glucose well controlled without insulin.  Anemia of critical illness. - f/u CBC intermittently - transfuse for Hb < 7  Dysphagia. - G tube placed - Tube feeds  Oral Candidiasis:  - Fluconazole until 9/4  Hyponatremia:  AM Na is 134  - DC Free water - Trend BMP  Disposition. - CM/SW searching for LTACH  2.6 x 2.1 cm nodular density in Lt upper lobe abutting pleura. - Seen on CT chest 04/29/21 - Will need repeat CT imaging in September  Best practice:  Diet/type: tube feeds DVT prophylaxis: SQ heparin GI prophylaxis: protonix Code Status: DNR Disposition: ICU  Dolan Amen, MD IMTS, PGY-3 Pager: [phone] 05/27/2021,7:18 AM

## 2021-05-27 NOTE — Progress Notes (Signed)
Attending:    Subjective: No acute issues overnight Wakes to voice  Objective: Vitals:   05/27/21 0600 05/27/21 0700 05/27/21 0800 05/27/21 0900  BP: (!) 126/109 131/84 102/69 (!) 162/98  Pulse: 97 86 92 (!) 105  Resp: 16 18 18  (!) 29  Temp:   98.5 F (36.9 C)   TempSrc:   Oral   SpO2: 100% 100% 100% 98%  Weight:      Height:       Vent Mode: PSV;CPAP FiO2 (%):  [40 %] 40 % Set Rate:  [14 bmp] 14 bmp Vt Set:  [550 mL] 550 mL PEEP:  [5 cmH20] 5 cmH20 Pressure Support:  [8 cmH20-10 cmH20] 10 cmH20 Plateau Pressure:  [14 cmH20-16 cmH20] 16 cmH20  Intake/Output Summary (Last 24 hours) at 05/27/2021 0936 Last data filed at 05/27/2021 0700 Gross per 24 hour  Intake 2610 ml  Output 1050 ml  Net 1560 ml    General:  In bed on vent HENT: NCAT ETT in place PULM: CTA B, vent supported breathing CV: RRR, no mgr GI: BS+, soft, nontender MSK: normal bulk and tone Neuro: sedated on vent    CBC    Component Value Date/Time   WBC 11.4 (H) 05/27/2021 0138   RBC 4.71 05/27/2021 0138   HGB 13.2 05/27/2021 0138   HCT 41.6 05/27/2021 0138   PLT 510 (H) 05/27/2021 0138   MCV 88.3 05/27/2021 0138   MCH 28.0 05/27/2021 0138   MCHC 31.7 05/27/2021 0138   RDW 14.0 05/27/2021 0138   LYMPHSABS 3.0 05/27/2021 0138   MONOABS 0.7 05/27/2021 0138   EOSABS 0.4 05/27/2021 0138   BASOSABS 0.1 05/27/2021 0138    BMET    Component Value Date/Time   NA 134 (L) 05/27/2021 0138   K 4.9 05/27/2021 0138   CL 103 05/27/2021 0138   CO2 18 (L) 05/27/2021 0138   GLUCOSE 176 (H) 05/27/2021 0138   BUN 39 (H) 05/27/2021 0138   CREATININE 0.94 05/27/2021 0138   CALCIUM 9.6 05/27/2021 0138   GFRNONAA >60 05/27/2021 0138    CXR images personally reviewed, interstitial edema bilaterally  Impression/Plan: Chronic systolic heart failure> continue spironolactone, lopressor, lasix today, change lisinopril to losartan today, sglt2 inhibitor pending, lasix today Anoxic brain injury with  myoclonus> stop keppra (uncertain benefit from this drug) continue clonazepam for now, monitor myoclonus Acute respiratory failure with hypoxemia> wean off oxygen, continue pressure support wean daily, needs LTACH Centrilobular emphysema> start brovana/yupelri given vent dependence Pulmonary nodule> needs repeat imaging in 6 weeks Oral candidiasis Hyponatremia> monitor, stop free water today  Dispo: needs LTACH  My cc time 30 minutes  05/29/2021, MD [place] [facility] Pager: [phone] Cell: [phone] After 7pm: [phone]

## 2021-05-27 NOTE — TOC Progression Note (Signed)
Transition of Care [facility] - Progression Note    Patient Details  Name: Vincent [name] MRN: [id number] Date of Birth: [birth year]  Transition of Care [facility]) CM/SW Contact  Ralene Bathe, LCSWA Phone Number: 05/27/2021, 4:19 PM  Clinical Narrative:     CSW was contacted by Irving Burton with [facility].  The facility was unable to reach a payment agreement with the insurance company and therefore the facility is unable to accept the patient.  CSW will continue to follow the patient and search for Vent SNF facilities who accept medicaid.    Expected Discharge Plan: Skilled Nursing Facility (Resides with sister ( W/C bound)) Barriers to Discharge: Vent Bed not available  Expected Discharge Plan and Services Expected Discharge Plan: Skilled Nursing Facility (Resides with sister ( W/C bound))   Discharge Planning Services: CM Consult                                           Social Determinants of Health (SDOH) Interventions    Readmission Risk Interventions No flowsheet data found.

## 2021-05-27 NOTE — TOC Benefit Eligibility Note (Signed)
Patient Advocate Encounter  Prior Authorization for Farxiga 10 mg has been approved.     Effective dates: 05/27/2021 through 05/27/2022  Patients co-pay is $4.00.     Roland Earl, CPhT Pharmacy Patient Advocate Specialist [place] Antimicrobial Stewardship Team Direct Number: [phone]  Fax: [phone]

## 2021-05-28 DIAGNOSIS — G931 Anoxic brain damage, not elsewhere classified: Secondary | ICD-10-CM | POA: Diagnosis not present

## 2021-05-28 DIAGNOSIS — J9601 Acute respiratory failure with hypoxia: Secondary | ICD-10-CM | POA: Diagnosis not present

## 2021-05-28 DIAGNOSIS — I469 Cardiac arrest, cause unspecified: Secondary | ICD-10-CM | POA: Diagnosis not present

## 2021-05-28 LAB — BASIC METABOLIC PANEL
Anion gap: 14 (ref 5–15)
BUN: 41 mg/dL — ABNORMAL HIGH (ref 6–20)
CO2: 26 mmol/L (ref 22–32)
Calcium: 10.4 mg/dL — ABNORMAL HIGH (ref 8.9–10.3)
Chloride: 95 mmol/L — ABNORMAL LOW (ref 98–111)
Creatinine, Ser: 1.02 mg/dL (ref 0.61–1.24)
GFR, Estimated: >60 mL/min (ref >60)
Glucose, Bld: 133 mg/dL — ABNORMAL HIGH (ref 70–99)
Potassium: 5 mmol/L (ref 3.5–5.1)
Sodium: 135 mmol/L (ref 135–145)

## 2021-05-28 MED ORDER — DAPAGLIFLOZIN PROPANEDIOL 10 MG PO TABS
10.0000 mg | ORAL_TABLET | Freq: Every day | ORAL | Status: DC
  Filled 2021-05-28: qty 1

## 2021-05-28 MED ORDER — CLONAZEPAM 0.5 MG PO TABS
0.5000 mg | ORAL_TABLET | Freq: Three times a day (TID) | ORAL | Status: DC | PRN
  Administered 2021-05-29 – 2021-05-30 (×3): 0.5 mg
  Filled 2021-05-28 (×4): qty 1

## 2021-05-28 MED ORDER — FUROSEMIDE 10 MG/ML IJ SOLN
40.0000 mg | Freq: Two times a day (BID) | INTRAMUSCULAR | Status: DC
  Administered 2021-05-28 – 2021-05-29 (×2): 40 mg via INTRAVENOUS
  Filled 2021-05-28 (×2): qty 4

## 2021-05-28 NOTE — Progress Notes (Signed)
Pt placed back on full vent support due to increased RR into the 40s and  increased WOB. RN aware, RT will continue to monitor.

## 2021-05-28 NOTE — Progress Notes (Signed)
Nutrition Follow-up  DOCUMENTATION CODES:   Not applicable  INTERVENTION:   Continue tube feeding via PEG: Jevity 1.2 at 75 ml/hr (1800 ml/day) ProSource TF 45 ml BID  Tube feeding regimen provides 2240 kcal, 122 grams of protein, and 1458 ml free water.  NUTRITION DIAGNOSIS:   Inadequate oral intake related to inability to eat as evidenced by NPO status.  Ongoing  GOAL:   Patient will meet greater than or equal to 90% of their needs  Met via TF  MONITOR:   Vent status, Labs, TF tolerance  REASON FOR ASSESSMENT:   Ventilator, Consult Enteral/tube feeding initiation and management  ASSESSMENT:   50 yo male admitted S/P PEA cardiac arrest at home. PMH includes tobacco abuse, emphysema, CVA, diabetes.  8/15 - s/p trach 8/24 - s/p PEG  Current TF via PEG: Jevity 1.2 at 75 ml/hr, Prosource TF 45 ml BID. Tolerating TF without difficulty. Free water flushes discontinued 8/30. Nutrisource with fiber supplement discontinued 8/30.   Patient remains on ventilator support via trach MV: 8.2 L/min Temp (24hrs), Avg:98.8 F (37.1 C), Min:98.2 F (36.8 C), Max:99.3 F (37.4 C)  Medications reviewed and include: Colace, Lasix, Protonix, Aldactone.  Labs reviewed. Phos 4.4 and Mag 2.3 on 8/27   UOP: 2175 ml x 24 hours I/O's: +26.9 L since admit  Admit weight: 70 kg Current weight: 66.3 kg (8/30)   Diet Order:   Diet Order     None       EDUCATION NEEDS:   Not appropriate for education at this time  Skin:  Skin Assessment: Reviewed RN Assessment  Last BM:  8/31 type 6  Height:   Ht Readings from Last 1 Encounters:  05/26/21 5' 8"  (1.727 m)    Weight:   Wt Readings from Last 1 Encounters:  05/27/21 66.3 kg    Ideal Body Weight:  70 kg  BMI:  Body mass index is 22.22 kg/m.  Estimated Nutritional Needs:   Kcal:  2000-2200  Protein:  110-125 gm  Fluid:  >/= 2 L   Lucas Mallow, RD, LDN, CNSC Please refer to Amion for contact  information.

## 2021-05-28 NOTE — Plan of Care (Signed)
  Problem: Activity: Goal: Ability to tolerate increased activity will improve Outcome: Progressing   Problem: Respiratory: Goal: Ability to maintain a clear airway and adequate ventilation will improve Outcome: Progressing   Problem: Clinical Measurements: Goal: Ability to maintain clinical measurements within normal limits will improve Outcome: Progressing Goal: Will remain free from infection Outcome: Progressing Goal: Diagnostic test results will improve Outcome: Progressing Goal: Respiratory complications will improve Outcome: Progressing Goal: Cardiovascular complication will be avoided Outcome: Progressing   Problem: Activity: Goal: Risk for activity intolerance will decrease Outcome: Progressing   Problem: Nutrition: Goal: Adequate nutrition will be maintained Outcome: Progressing   Problem: Coping: Goal: Level of anxiety will decrease Outcome: Progressing   Problem: Elimination: Goal: Will not experience complications related to bowel motility Outcome: Progressing Goal: Will not experience complications related to urinary retention Outcome: Progressing   Problem: Pain Managment: Goal: General experience of comfort will improve Outcome: Progressing   Problem: Safety: Goal: Ability to remain free from injury will improve Outcome: Progressing   Problem: Skin Integrity: Goal: Risk for impaired skin integrity will decrease Outcome: Progressing

## 2021-05-28 NOTE — Progress Notes (Signed)
NAME:  Vincent [name], MRN:  [id number], DOB:  12-Nov-1970, LOS: 29 ADMISSION DATE:  04/29/2021, CONSULTATION DATE:  04/29/21 REFERRING MD:  Dr. Jacqulyn Bath, CHIEF COMPLAINT:  Found Down   History of present illness   50 yo male smoker admitted with PEA cardiac arrest with ROSC after 6 minutes.  CT head showed old infarct. CT chest showed b/l dense lower lobe consolidation with changes of emphysema. UDS positive for benzodiazepines and THC.  Past Medical History  Emphysema, CVA, DM type 2  Significant Hospital Events   8/2: PEA Arrest, King airway in the field, CPR x6 minutes and epi x1, ROSC, Intubated in ED, Admitted to PCCM  8/4: Weaned off TTM, Coox O2 sat of 47%, started on milrinone and lasix 8/5 milrinone increased 8/6 TF stopped, OG placed to suction for ileus 8/8 Post pyloric Cortrak placed, myoclonic jerking, started Keppra, depakote 8/10 Completed antibiotic course for pneumonia. Family meeting transitioned to DNR. 8/15 Tracheostomy  8/18 Febrile overnight, started Zosyn for aspiration pneumonia 8/22 Completed Abx course for aspiration pneumonia  8/24 PEG placed 8/25: ABX started for fevers and ? Free air in abdomen. CT non-acute.   Interim history/subjective:  O/N Events: None   Non interactive with examiner  Objective   Blood pressure (!) 141/111, pulse (!) 104, temperature 98.6 F (37 C), temperature source Oral, resp. rate 19, height 5\' 8"  (1.727 m), weight 66.3 kg, SpO2 95 %.    Vent Mode: PRVC FiO2 (%):  [40 %] 40 % Set Rate:  [14 bmp] 14 bmp Vt Set:  [550 mL] 550 mL PEEP:  [5 cmH20] 5 cmH20 Pressure Support:  [10 cmH20] 10 cmH20 Plateau Pressure:  [15 cmH20-21 cmH20] 15 cmH20   Intake/Output Summary (Last 24 hours) at 05/28/2021 0734 Last data filed at 05/28/2021 0600 Gross per 24 hour  Intake 2025 ml  Output 2175 ml  Net -150 ml    Filed Weights   05/25/21 0349 05/26/21 0134 05/27/21 0500  Weight: 61.7 kg 65.3 kg 66.3 kg   Physical  Exam Constitutional:      Comments: Eyes open, non interactive, NAD  Eyes:     Pupils: Pupils are equal, round, and reactive to light.  Cardiovascular:     Rate and Rhythm: Normal rate and regular rhythm.     Pulses: Normal pulses.     Heart sounds: No murmur heard.   No friction rub. No gallop.  Pulmonary:     Effort: Pulmonary effort is normal.     Breath sounds: No wheezing or rales.  Abdominal:     General: Abdomen is flat. Bowel sounds are normal.     Palpations: Abdomen is soft.     Comments: PEG tube in place, no signs of erosions, erythema, drainage, purulence.   Musculoskeletal:     Right lower leg: No edema.     Left lower leg: No edema.      Resolved Hospital Problem list   Bowel Obstruction/ileus, Acute Kidney Injury from ATN 2nd to Cardiogenic shock, Aspiration Pneumonia  Hyponatremia  Assessment & Plan:   PEA Arrest Acute Encephalopathy:  Neurology suggests very slow if any recovery and unlikely return to an independent state of living. They have signed off. No clear metabolic cause of encephalopathy.   Hx of Rt MCA CVA Myoclonic Jerking: - Decrease Klonopin to 0.5mg  TIDPRN  Acute Systolic/Diastolic CHF after Cardiac Arrest: - Continue lopressor 50 mg Q8H, lisinopril 5 mg QD, spironolactone 12.5 mg QD - Negative -75 cc total yesterday,  increase frequency of Lasix.  - Lasix 40 mg IV BID   Acute Hypoxic Respiratory Failure from Cardiac Arrest and Aspiration Pneumonia. Failure to wean from ventilator s/p tracheostomy. Centrilobular Emphysema:  - Pressure support wean as tolerated - Trach care - PRN Duonebs - VAP Protocol - Yupelri 175 mcg QD - Brovana 15 mcg BID  DM type 2 poorly controlled with hyperglycemia. - Glucose well controlled without insulin.  Anemia of critical illness. - f/u CBC intermittently - transfuse for Hb < 7  Dysphagia. - G tube placed - Tube feeds  Oral Candidiasis:  - Fluconazole until 9/4  Disposition. - CM/SW  searching for LTACH  2.6 x 2.1 cm nodular density in Lt upper lobe abutting pleura. - Seen on CT chest 04/29/21 - Will need repeat CT imaging in September  Best practice:  Diet/type: tube feeds DVT prophylaxis: SQ heparin GI prophylaxis: protonix Code Status: DNR Disposition: ICU  Dolan Amen, MD IMTS, PGY-3 Pager: [phone] 05/28/2021,7:34 AM

## 2021-05-28 NOTE — Progress Notes (Signed)
Attending:    Subjective: No acute events Only net negative 75cc after lasix yesterday Mental status exam unchanged  Objective: Vitals:   05/28/21 0749 05/28/21 0800 05/28/21 0828 05/28/21 0900  BP:  124/80  112/74  Pulse:  (!) 106  (!) 103  Resp:  (!) 28  16  Temp:      TempSrc:      SpO2: 93% 99% 97% 100%  Weight:      Height:       Vent Mode: PRVC FiO2 (%):  [40 %] 40 % Set Rate:  [14 bmp-18 bmp] 18 bmp Vt Set:  [550 mL] 550 mL PEEP:  [5 cmH20] 5 cmH20 Pressure Support:  [10 cmH20-12 cmH20] 12 cmH20 Plateau Pressure:  [15 cmH20-21 cmH20] 15 cmH20  Intake/Output Summary (Last 24 hours) at 05/28/2021 1045 Last data filed at 05/28/2021 0900 Gross per 24 hour  Intake 2025 ml  Output 2025 ml  Net 0 ml   General:  In bed on vent HENT: NCAT tracheostomy in place PULM: CTA B, vent supported breathing CV: RRR, no mgr GI: BS+, soft, nontender MSK: normal bulk and tone Neuro: opens eyes to voice     CBC    Component Value Date/Time   WBC 11.4 (H) 05/27/2021 0138   RBC 4.71 05/27/2021 0138   HGB 13.2 05/27/2021 0138   HCT 41.6 05/27/2021 0138   PLT 510 (H) 05/27/2021 0138   MCV 88.3 05/27/2021 0138   MCH 28.0 05/27/2021 0138   MCHC 31.7 05/27/2021 0138   RDW 14.0 05/27/2021 0138   LYMPHSABS 3.0 05/27/2021 0138   MONOABS 0.7 05/27/2021 0138   EOSABS 0.4 05/27/2021 0138   BASOSABS 0.1 05/27/2021 0138    BMET    Component Value Date/Time   NA 135 05/28/2021 0750   K 5.0 05/28/2021 0750   CL 95 (L) 05/28/2021 0750   CO2 26 05/28/2021 0750   GLUCOSE 133 (H) 05/28/2021 0750   BUN 41 (H) 05/28/2021 0750   CREATININE 1.02 05/28/2021 0750   CALCIUM 10.4 (H) 05/28/2021 0750   GFRNONAA >60 05/28/2021 0750      Impression/Plan: Myoclonus > no change after stopping keppra, will decrease dose of clonazepam to 0.5 Systolic heart failure> Continue lisinopril, spironolactone, metoprolol, give lasix again today twice Acute respiratory failure with hypoxemia>  continue pressure support weaning, needs LTACH Anoxic brain injury> frequent orientation   My cc time n/a minutes  Heber Smoaks, MD [place] P[facility]M Pager: [phone] Cell: [phone] After 7pm: [phone]

## 2021-05-28 NOTE — NC FL2 (Signed)
[place] LEVEL OF CARE SCREENING TOOL     IDENTIFICATION  Patient Name: Vincent [name] Birthdate: 1971/05/07 Sex: male Admission Date (Current Location): 04/29/2021  [place] and [place] Number:  Kathleen Argue [id number] Facility and Address:  [facility] [place] [place] [address] [place], [place] [address]      Provider Number: [id number]  Attending Physician Name and Address:  Juanito Doom, MD  Relative Name and Phone Number:  Carmie Kanner (Sister)   [phone]    Current Level of Care: Hospital Recommended Level of Care: Vent SNF Prior Approval Number:    Date Approved/Denied:   PASRR Number: [id number]  Discharge Plan: Other (Comment) (Vent SNF)    Current Diagnoses: Patient Active Problem List   Diagnosis Date Noted   Goals of care, counseling/discussion    Tracheostomy in place [facility])    Anoxic encephalopathy ([place])    Seizure-like activity ([place])    Acute respiratory failure with hypoxia ([place])    Cardiac arrest ([place]) 04/29/2021    Orientation RESPIRATION BLADDER Height & Weight      (UTA)  Tracheostomy, Vent Incontinent, External catheter Weight: 146 lb 2.6 oz (66.3 kg) Height:  5' 8"  (172.7 cm)  BEHAVIORAL SYMPTOMS/MOOD NEUROLOGICAL BOWEL NUTRITION STATUS      Incontinent Feeding tube  AMBULATORY STATUS COMMUNICATION OF NEEDS Skin   Extensive Assist Does not communicate Surgical wounds                       Personal Care Assistance Level of Assistance  Total care       Total Care Assistance: Maximum assistance   Functional Limitations Info  Speech, Hearing, Sight Sight Info: Adequate Hearing Info: Adequate Speech Info: Impaired (UTA)    SPECIAL CARE FACTORS FREQUENCY                       Contractures Contractures Info: Not present    Additional Factors Info  Code Status, Allergies, Suctioning Needs Code Status Info: DNR Allergies Info: NKA           Current Medications (05/28/2021):   This is the current hospital active medication list Current Facility-Administered Medications  Medication Dose Route Frequency Provider Last Rate Last Admin   0.9 %  sodium chloride infusion  250 mL Intravenous Continuous Desai, Rahul P, PA-C   Stopped at 05/22/21 2003   acetaminophen (TYLENOL) 160 MG/5ML solution 650 mg  650 mg Per Tube Q6H PRN Deland Pretty, MD   650 mg at 05/22/21 1543   arformoterol (BROVANA) nebulizer solution 15 mcg  15 mcg Nebulization BID Maudie Mercury, MD   15 mcg at 05/28/21 0748   atorvastatin (LIPITOR) tablet 40 mg  40 mg Per Tube QHS Chesley Mires, MD   40 mg at 05/27/21 2128   chlorhexidine gluconate (MEDLINE KIT) (PERIDEX) 0.12 % solution 15 mL  15 mL Mouth Rinse BID Hunsucker, Bonna Gains, MD   15 mL at 05/28/21 0730   Chlorhexidine Gluconate Cloth 2 % PADS 6 each  6 each Topical Q0600 Spero Geralds, MD   6 each at 05/28/21 0917   clonazePAM (KLONOPIN) tablet 0.5 mg  0.5 mg Per Tube TID PRN Juanito Doom, MD       [START ON 05/29/2021] dapagliflozin propanediol (FARXIGA) tablet 10 mg  10 mg Oral Daily Maudie Mercury, MD       docusate (COLACE) 50 MG/5ML liquid 100 mg  100 mg Per Tube BID  Maudie Mercury, MD   100 mg at 05/28/21 6384   feeding supplement (JEVITY 1.2 CAL) liquid 1,000 mL  1,000 mL Per Tube Continuous Olalere, Adewale A, MD 75 mL/hr at 05/28/21 0606 1,000 mL at 05/28/21 0606   feeding supplement (PROSource TF) liquid 45 mL  45 mL Per Tube BID Olalere, Adewale A, MD   45 mL at 05/28/21 0907   fluconazole (DIFLUCAN) tablet 100 mg  100 mg Per Tube Daily Corey Harold, NP   100 mg at 05/28/21 0908   furosemide (LASIX) injection 40 mg  40 mg Intravenous BID Maudie Mercury, MD       heparin injection 5,000 Units  5,000 Units Subcutaneous Q8H Desai, Rahul P, PA-C   5,000 Units at 05/28/21 1437   ipratropium-albuterol (DUONEB) 0.5-2.5 (3) MG/3ML nebulizer solution 3 mL  3 mL Nebulization Q6H PRN Julian Hy, DO       losartan (COZAAR)  tablet 25 mg  25 mg Per Tube Daily Maudie Mercury, MD   25 mg at 05/28/21 0908   MEDLINE mouth rinse  15 mL Mouth Rinse 10 times per day Lanier Clam, MD   15 mL at 05/28/21 1553   metoprolol tartrate (LOPRESSOR) 25 mg/10 mL oral suspension 50 mg  50 mg Per Tube Q8H Jacky Kindle, MD   50 mg at 05/28/21 0556   oxyCODONE (Oxy IR/ROXICODONE) immediate release tablet 5 mg  5 mg Per Tube Q6H PRN Simonne Maffucci B, MD   5 mg at 05/28/21 0908   pantoprazole sodium (PROTONIX) 40 mg/20 mL oral suspension 40 mg  40 mg Per Tube QHS Ursula Beath, RPH   40 mg at 05/27/21 2129   polyethylene glycol (MIRALAX / GLYCOLAX) packet 17 g  17 g Per Tube Daily PRN Maudie Mercury, MD   17 g at 05/27/21 1202   revefenacin (YUPELRI) nebulizer solution 175 mcg  175 mcg Nebulization Daily Maudie Mercury, MD   175 mcg at 05/28/21 0827   sodium chloride flush (NS) 0.9 % injection 10-40 mL  10-40 mL Intracatheter Q12H Candee Furbish, MD   10 mL at 05/28/21 0908   sodium chloride flush (NS) 0.9 % injection 10-40 mL  10-40 mL Intracatheter PRN Candee Furbish, MD       spironolactone (ALDACTONE) tablet 12.5 mg  12.5 mg Per Tube Daily Paytes, Austin A, RPH   12.5 mg at 05/28/21 0908     Discharge Medications: Please see discharge summary for a list of discharge medications.  Relevant Imaging Results:  Relevant Lab Results:   Additional Information SSN:  [place], LCSWA

## 2021-05-29 ENCOUNTER — Inpatient Hospital Stay (HOSPITAL_COMMUNITY): Payer: Medicaid Other

## 2021-05-29 DIAGNOSIS — J9601 Acute respiratory failure with hypoxia: Secondary | ICD-10-CM | POA: Diagnosis not present

## 2021-05-29 DIAGNOSIS — I469 Cardiac arrest, cause unspecified: Secondary | ICD-10-CM | POA: Diagnosis not present

## 2021-05-29 DIAGNOSIS — G931 Anoxic brain damage, not elsewhere classified: Secondary | ICD-10-CM | POA: Diagnosis not present

## 2021-05-29 LAB — COMPREHENSIVE METABOLIC PANEL
ALT: 70 U/L — ABNORMAL HIGH (ref 0–44)
AST: 42 U/L — ABNORMAL HIGH (ref 15–41)
Albumin: 4.1 g/dL (ref 3.5–5.0)
Alkaline Phosphatase: 69 U/L (ref 38–126)
Anion gap: 14 (ref 5–15)
BUN: 60 mg/dL — ABNORMAL HIGH (ref 6–20)
CO2: 25 mmol/L (ref 22–32)
Calcium: 10.3 mg/dL (ref 8.9–10.3)
Chloride: 98 mmol/L (ref 98–111)
Creatinine, Ser: 1.49 mg/dL — ABNORMAL HIGH (ref 0.61–1.24)
GFR, Estimated: 56 mL/min — ABNORMAL LOW (ref >60)
Glucose, Bld: 150 mg/dL — ABNORMAL HIGH (ref 70–99)
Potassium: 5.9 mmol/L — ABNORMAL HIGH (ref 3.5–5.1)
Sodium: 137 mmol/L (ref 135–145)
Total Bilirubin: 0.6 mg/dL (ref 0.3–1.2)
Total Protein: 8.8 g/dL — ABNORMAL HIGH (ref 6.5–8.1)

## 2021-05-29 LAB — PHOSPHORUS
Phosphorus: 6.7 mg/dL — ABNORMAL HIGH (ref 2.5–4.6)
Phosphorus: 6.5 mg/dL — ABNORMAL HIGH (ref 2.5–4.6)

## 2021-05-29 LAB — GLUCOSE, CAPILLARY
Glucose-Capillary: 138 mg/dL — ABNORMAL HIGH (ref 70–99)
Glucose-Capillary: 152 mg/dL — ABNORMAL HIGH (ref 70–99)
Glucose-Capillary: 124 mg/dL — ABNORMAL HIGH (ref 70–99)
Glucose-Capillary: 150 mg/dL — ABNORMAL HIGH (ref 70–99)
Glucose-Capillary: 181 mg/dL — ABNORMAL HIGH (ref 70–99)
Glucose-Capillary: 143 mg/dL — ABNORMAL HIGH (ref 70–99)
Glucose-Capillary: 153 mg/dL — ABNORMAL HIGH (ref 70–99)
Glucose-Capillary: 91 mg/dL (ref 70–99)
Glucose-Capillary: 127 mg/dL — ABNORMAL HIGH (ref 70–99)

## 2021-05-29 LAB — CBC
HCT: 44.2 % (ref 39.0–52.0)
Hemoglobin: 14.6 g/dL (ref 13.0–17.0)
MCH: 28.6 pg (ref 26.0–34.0)
MCHC: 33 g/dL (ref 30.0–36.0)
MCV: 86.7 fL (ref 80.0–100.0)
Platelets: 548 10*3/uL — ABNORMAL HIGH (ref 150–400)
RBC: 5.1 MIL/uL (ref 4.22–5.81)
RDW: 14.3 % (ref 11.5–15.5)
WBC: 16.1 10*3/uL — ABNORMAL HIGH (ref 4.0–10.5)
nRBC: 0 % (ref 0.0–0.2)

## 2021-05-29 LAB — BASIC METABOLIC PANEL
Anion gap: 16 — ABNORMAL HIGH (ref 5–15)
Anion gap: 13 (ref 5–15)
Anion gap: 16 — ABNORMAL HIGH (ref 5–15)
BUN: 60 mg/dL — ABNORMAL HIGH (ref 6–20)
BUN: 64 mg/dL — ABNORMAL HIGH (ref 6–20)
BUN: 73 mg/dL — ABNORMAL HIGH (ref 6–20)
CO2: 27 mmol/L (ref 22–32)
CO2: 25 mmol/L (ref 22–32)
CO2: 22 mmol/L (ref 22–32)
Calcium: 10.5 mg/dL — ABNORMAL HIGH (ref 8.9–10.3)
Calcium: 10.4 mg/dL — ABNORMAL HIGH (ref 8.9–10.3)
Calcium: 10.4 mg/dL — ABNORMAL HIGH (ref 8.9–10.3)
Chloride: 94 mmol/L — ABNORMAL LOW (ref 98–111)
Chloride: 98 mmol/L (ref 98–111)
Chloride: 103 mmol/L (ref 98–111)
Creatinine, Ser: 1.49 mg/dL — ABNORMAL HIGH (ref 0.61–1.24)
Creatinine, Ser: 1.5 mg/dL — ABNORMAL HIGH (ref 0.61–1.24)
Creatinine, Ser: 1.6 mg/dL — ABNORMAL HIGH (ref 0.61–1.24)
GFR, Estimated: 56 mL/min — ABNORMAL LOW (ref >60)
GFR, Estimated: 56 mL/min — ABNORMAL LOW (ref >60)
GFR, Estimated: 52 mL/min — ABNORMAL LOW (ref >60)
Glucose, Bld: 124 mg/dL — ABNORMAL HIGH (ref 70–99)
Glucose, Bld: 155 mg/dL — ABNORMAL HIGH (ref 70–99)
Glucose, Bld: 161 mg/dL — ABNORMAL HIGH (ref 70–99)
Potassium: 6.1 mmol/L — ABNORMAL HIGH (ref 3.5–5.1)
Potassium: 5.6 mmol/L — ABNORMAL HIGH (ref 3.5–5.1)
Potassium: 4.8 mmol/L (ref 3.5–5.1)
Sodium: 137 mmol/L (ref 135–145)
Sodium: 136 mmol/L (ref 135–145)
Sodium: 141 mmol/L (ref 135–145)

## 2021-05-29 LAB — PROCALCITONIN: Procalcitonin: 0.43 ng/mL

## 2021-05-29 LAB — MAGNESIUM
Magnesium: 2.7 mg/dL — ABNORMAL HIGH (ref 1.7–2.4)
Magnesium: 3 mg/dL — ABNORMAL HIGH (ref 1.7–2.4)

## 2021-05-29 LAB — CK: Total CK: 2450 U/L — ABNORMAL HIGH (ref 49–397)

## 2021-05-29 MED ORDER — CALCIUM GLUCONATE-NACL 1-0.675 GM/50ML-% IV SOLN
1.0000 g | Freq: Once | INTRAVENOUS | Status: AC
  Administered 2021-05-29: 1000 mg via INTRAVENOUS
  Filled 2021-05-29: qty 50

## 2021-05-29 MED ORDER — FENTANYL CITRATE (PF) 100 MCG/2ML IJ SOLN
100.0000 ug | Freq: Once | INTRAMUSCULAR | Status: AC
Start: 2021-05-29 — End: 2021-05-29
  Administered 2021-05-29: 100 ug via INTRAVENOUS
  Filled 2021-05-29: qty 2

## 2021-05-29 MED ORDER — SODIUM CHLORIDE 0.9 % IV BOLUS
500.0000 mL | Freq: Once | INTRAVENOUS | Status: AC
  Administered 2021-05-29: 500 mL via INTRAVENOUS

## 2021-05-29 MED ORDER — VANCOMYCIN HCL 1500 MG/300ML IV SOLN
1500.0000 mg | Freq: Once | INTRAVENOUS | Status: AC
  Administered 2021-05-29: 1500 mg via INTRAVENOUS
  Filled 2021-05-29: qty 300

## 2021-05-29 MED ORDER — INSULIN ASPART 100 UNIT/ML IJ SOLN
10.0000 [IU] | Freq: Once | INTRAMUSCULAR | Status: AC
  Administered 2021-05-29: 10 [IU] via INTRAVENOUS

## 2021-05-29 MED ORDER — INSULIN ASPART 100 UNIT/ML IV SOLN
5.0000 [IU] | Freq: Once | INTRAVENOUS | Status: AC
  Administered 2021-05-29: 5 [IU] via INTRAVENOUS

## 2021-05-29 MED ORDER — LEVETIRACETAM 100 MG/ML PO SOLN
1000.0000 mg | Freq: Once | ORAL | Status: AC
  Administered 2021-05-29: 1000 mg
  Filled 2021-05-29: qty 10

## 2021-05-29 MED ORDER — MIDAZOLAM HCL 2 MG/2ML IJ SOLN
2.0000 mg | Freq: Once | INTRAMUSCULAR | Status: AC
  Administered 2021-05-29: 2 mg via INTRAVENOUS

## 2021-05-29 MED ORDER — SODIUM CHLORIDE 0.9 % IV SOLN
2.0000 g | Freq: Once | INTRAVENOUS | Status: AC
  Administered 2021-05-29: 2 g via INTRAVENOUS
  Filled 2021-05-29: qty 2

## 2021-05-29 MED ORDER — SODIUM CHLORIDE 0.9 % IV SOLN
2.0000 g | Freq: Two times a day (BID) | INTRAVENOUS | Status: DC
  Administered 2021-05-30: 2 g via INTRAVENOUS
  Filled 2021-05-29: qty 2

## 2021-05-29 MED ORDER — MIDAZOLAM HCL 2 MG/2ML IJ SOLN
2.0000 mg | Freq: Once | INTRAMUSCULAR | Status: AC
  Administered 2021-05-29: 2 mg via INTRAVENOUS
  Filled 2021-05-29: qty 2

## 2021-05-29 MED ORDER — VANCOMYCIN HCL 1250 MG/250ML IV SOLN
1250.0000 mg | INTRAVENOUS | Status: DC
  Filled 2021-05-29: qty 250

## 2021-05-29 MED ORDER — CALCIUM ACETATE (PHOS BINDER) 667 MG PO CAPS
667.0000 mg | ORAL_CAPSULE | Freq: Three times a day (TID) | ORAL | Status: DC
  Administered 2021-05-29: 667 mg via ORAL
  Filled 2021-05-29 (×2): qty 1

## 2021-05-29 MED ORDER — DEXTROSE 50 % IV SOLN
50.0000 mL | Freq: Once | INTRAVENOUS | Status: AC
  Administered 2021-05-29: 50 mL via INTRAVENOUS
  Filled 2021-05-29: qty 50

## 2021-05-29 MED ORDER — SODIUM CHLORIDE 0.9 % IV SOLN
INTRAVENOUS | Status: DC
  Administered 2021-05-31: 1000 mL via INTRAVENOUS

## 2021-05-29 MED ORDER — LEVETIRACETAM 100 MG/ML PO SOLN
500.0000 mg | Freq: Two times a day (BID) | ORAL | Status: DC
  Administered 2021-05-29 – 2021-06-11 (×27): 500 mg
  Filled 2021-05-29 (×30): qty 5

## 2021-05-29 MED ORDER — DEXTROSE 50 % IV SOLN
1.0000 | Freq: Once | INTRAVENOUS | Status: AC
  Administered 2021-05-29: 50 mL via INTRAVENOUS
  Filled 2021-05-29: qty 50

## 2021-05-29 MED ORDER — MIDAZOLAM HCL 2 MG/2ML IJ SOLN
INTRAMUSCULAR | Status: AC
  Filled 2021-05-29: qty 2

## 2021-05-29 MED ORDER — SODIUM ZIRCONIUM CYCLOSILICATE 10 G PO PACK
10.0000 g | PACK | Freq: Once | ORAL | Status: AC
  Administered 2021-05-29: 10 g
  Filled 2021-05-29: qty 1

## 2021-05-29 NOTE — Progress Notes (Signed)
Pharmacy Antibiotic Note  Vincent [name] is a 50 y.o. male admitted on 04/29/2021 with  sepsis - unknown source .  Pharmacy has been consulted for vancomycin/cefepime dosing. Noted AKI - SCr trended up to 1.5 today.  Plan: Cefepime 2g IV q12h Vancomycin 1500mg  IV x 1; then 1250mg  IV q24h. Goal AUC 400-550. Expected AUC: 512 SCr used: 1.5 Monitor clinical progress, c/s, renal function F/u de-escalation plan/LOT, vancomycin levels as indicated   Height: 5\' 8"  (172.7 cm) Weight: 66.3 kg (146 lb 2.6 oz) IBW/kg (Calculated) : 68.4  Temp (24hrs), Avg:100.1 F (37.8 C), Min:97.7 F (36.5 C), Max:103.7 F (39.8 C)  Recent Labs  Lab 05/23/21 0153 05/24/21 0048 05/25/21 0056 05/27/21 0138 05/28/21 0750 05/29/21 0223 05/29/21 0419 05/29/21 0700  WBC 11.7* 13.9*  --  11.4*  --  16.1*  --   --   CREATININE 1.18 1.01   < > 0.94 1.02 1.49* 1.49* 1.50*   < > = values in this interval not displayed.    Estimated Creatinine Clearance: 54.6 mL/min (A) (by C-G formula based on SCr of 1.5 mg/dL (H)).    No Known Allergies   07/29/21, PharmD, BCPS Please check AMION for all [facility] contact numbers Clinical Pharmacist 05/29/2021 2:56 PM

## 2021-05-29 NOTE — Progress Notes (Signed)
Pt Left wrist PIV potentially infiltrated.  Stopped all infusions one of which included vancomycin.  Marked potential area of infiltration.  Contacted IV team and informed Dr. Kendrick Fries and NP Merry Proud.   Erick Blinks, RN

## 2021-05-29 NOTE — Progress Notes (Signed)
S: Called to assess for fever, diaphoresis, tachycardia and increase in abnormal motor movements / convulsive jerking  O: Blood pressure 132/83, pulse (!) 147, temperature (!) 103 F (39.4 C), temperature source Oral, resp. rate (!) 21, height 5\' 8"  (1.727 m), weight 66.3 kg, SpO2 100 %.  Patient resting in bed, head turned up to the right Remains tachycardic Diaphoresis ongoing On PRVC - weaned briefly this am but aborted due to abnormal motor movements   A: Convulsive Motor Movements Fever  Diaphoresis  P: Assess repeat EEG Assess blood and respiratory cultures  Add empiric vancomycin + cefepime  Assess PCT  Cooling blanket Tylenol as needed    , MSN, APRN, NP-C, AGACNP-BC [facility] 05/29/2021, 12:44 PM   Please see [email or web address] for pager details.   From 7A-7P if no response, please call [phone] After hours, please call ELink [phone]

## 2021-05-29 NOTE — Progress Notes (Signed)
Pt placed back on full vent support due to increased RR into the 40s and increased WOB. Currently tolerating well, RN aware, RT will continue to monitor.

## 2021-05-29 NOTE — TOC Progression Note (Addendum)
Transition of Care [facility]) - Progression Note    Patient Details  Name: Unnamed Karter Hellmer MRN: [id number] Date of Birth: [birth year]  Transition of [facility]) CM/SW Contact  Ralene Bathe, LCSWA Phone Number: 05/29/2021, 10:06 AM  Clinical Narrative:     09:30-  CSW sent referral to [facility] (V[facility] SNF) for review.  CSW alerted Prem in admissions that the referral was sent. 10:30-  CSW notified that the [facility] does not have any beds available and is not in network with the patient's insurance.    16:26-  CSW contacted [facility] [phone] and spoke with Melissa in the business office.  CSW was informed that the facility is not in network with managed medicaid plans and therefore cannot accept the patient.  16:28-  CSW contacted [facility] [phone] to inquire as to whether they accept managed medicaid.  CSW left a message for Waymond Cera in the business office requesting a returned call.    Of note, both [facility] and [facility] are unable to accept the patient because the facilities are not in network with the patient's insurance.  Also, two of the three [facility] SNFs in [place] are unable to accept the patient due to the patient's insurance not being in network.  CSW will continue to follow.  Expected Discharge Plan: Skilled Nursing Facility (Resides with sister ( W/C bound)) Barriers to Discharge: Vent Bed not available  Expected Discharge Plan and Services Expected Discharge Plan: Skilled Nursing Facility (Resides with sister ( W/C bound))   Discharge Planning Services: CM Consult                                           Social Determinants of Health (SDOH) Interventions    Readmission Risk Interventions No flowsheet data found.

## 2021-05-29 NOTE — Progress Notes (Signed)
EEG complete - results pending 

## 2021-05-29 NOTE — Progress Notes (Signed)
eLink Physician-Brief Progress Note Patient Name: Vincent [name] DOB: August 08, 1971 MRN: [id number]   Date of Service  05/29/2021  HPI/Events of Note  CK 2,450.  eICU Interventions  Normal Saline gtt 100 ml / hour x 10 hours ordered.        Thomasene Lot Izik Bingman 05/29/2021, 10:27 PM

## 2021-05-29 NOTE — Progress Notes (Addendum)
eLink Physician-Brief Progress Note Patient Name: Vincent [name] DOB: 06/08/1971 MRN: [id number]   Date of Service  05/29/2021  HPI/Events of Note  K+ 5.9, Phos 6.7.  eICU Interventions  Will recheck K+ , Mg++ and Phos, as bedside RN is concerned that it may be a hemolyzed specimen.        Thomasene Lot Bertha Lokken 05/29/2021, 3:39 AM

## 2021-05-29 NOTE — Procedures (Addendum)
Patient Name: Vincent [name]  MRN: [id number]  Epilepsy Attending: Charlsie Quest  Referring Physician/Provider: Dr Max Fickle Date: 05/29/2021 Duration: 22.35 mins   Patient history: 50 year old male status post cardiac arrest. EEG to evaluate for seizure   Level of alertness:  lethargic   AEDs during EEG study: LEV   Technical aspects: This EEG study was done with scalp electrodes positioned according to the 10-20 International system of electrode placement. Electrical activity was acquired at a sampling rate of 500Hz  and reviewed with a high frequency filter of 70Hz  and a low frequency filter of 1Hz . EEG data were recorded continuously and digitally stored.   Description: EEG showed continuous generalized 3 to 5 Hz theta-delta slowing.   Hyperventilation and photic stimulation were not performed.      ABNORMALITY -Continuous slow, generalized   IMPRESSION: This study is suggestive of moderate to severe diffuse encephalopathy, nonspecific to etiology.  No seizures or epileptiform discharges were seen during this study.    [name] 

## 2021-05-29 NOTE — Progress Notes (Signed)
Sister, Clarise Cruz Little, called for update. Call went immediately to voice mail.  Message left for return call.    Canary Brim, MSN, APRN, NP-C, AGACNP-BC [facility] 05/29/2021, 11:05 AM   Please see [email or web address] for pager details.   From 7A-7P if no response, please call [phone] After hours, please call ELink [phone]

## 2021-05-29 NOTE — Progress Notes (Signed)
RT NOTE: patient placed on CPAP/PSV of 10/5 at 0750.  Currently tolerating well.  Will continue to monitor and wean as tolerated.

## 2021-05-29 NOTE — Progress Notes (Addendum)
NAME:  Vincent [name], MRN:  [id number], DOB:  09-May-1971, LOS: 30 ADMISSION DATE:  04/29/2021, CONSULTATION DATE:  04/29/21 REFERRING MD:  Dr. Jacqulyn Bath, CHIEF COMPLAINT:  Found Down   History of present illness   50 y/o male smoker admitted with PEA cardiac arrest with ROSC after 6 minutes.  CT head showed old infarct. CT chest showed b/l dense lower lobe consolidation with changes of emphysema. UDS positive for benzodiazepines and THC.  Past Medical History  Emphysema, CVA, DM type 2  Significant Hospital Events   8/2 PEA Arrest, [place] airway in the field, CPR x6 minutes and epi x1, ROSC, Intubated in ED, Admitted to PCCM  8/4 Weaned off TTM, Coox O2 sat of 47%, started on milrinone and lasix 8/5 milrinone increased 8/6 TF stopped, OG placed to suction for ileus 8/8 Post pyloric Cortrak placed, myoclonic jerking, started Keppra, depakote 8/10 Completed antibiotic course for pneumonia. Family meeting transitioned to DNR. 8/15 Tracheostomy  8/18 Febrile overnight, started Zosyn for aspiration pneumonia 8/22 Completed Abx course for aspiration pneumonia  8/24 PEG placed 8/25 ABX started for fevers and ? Free air in abdomen. CT non-acute.  8/31 PSV wean 2 hours  Interim history/subjective:  Vent - 40%, PEEP 10 Stap epi growing in respiratory culture, suspected contaminant  Tmax 100.4 / WBC 16.1  Glucose range 124-155  I/O - 900 UOP, +1.1L in last 24 hours  Rise in K+/Cr on spiro + losartan Shaking episodes intermittently this am   Objective   Blood pressure 129/68, pulse 98, temperature (!) 100.4 F (38 C), temperature source Oral, resp. rate (!) 24, height 5\' 8"  (1.727 m), weight 66.3 kg, SpO2 100 %.    Vent Mode: PSV;CPAP FiO2 (%):  [40 %] 40 % Set Rate:  [14 bmp] 14 bmp Vt Set:  [550 mL] 550 mL PEEP:  [5 cmH20] 5 cmH20 Pressure Support:  [10 cmH20] 10 cmH20 Plateau Pressure:  [12 cmH20-18 cmH20] 14 cmH20   Intake/Output Summary (Last 24 hours) at 05/29/2021 0835 Last  data filed at 05/29/2021 0600 Gross per 24 hour  Intake 1957.5 ml  Output 700 ml  Net 1257.5 ml    Filed Weights   05/25/21 0349 05/26/21 0134 05/27/21 0500  Weight: 61.7 kg 65.3 kg 66.3 kg   Exam: General: chronically ill appearing adult male lying in bed in NAD on vent  HEENT: MM pink/moist, trach midline clean/dry, anicteric, diaphoresis noted (chronic, intermittent) Neuro: eyes open, will look at provider, does not track, spontaneous movement but no follow commands CV: s1s2 RRR, ST on monitor, no m/r/g PULM: non-labored, intermittent tachypnea, lungs clear bilaterally  GI: soft, bsx4 active, PEG in place, tolerating TF Extremities: warm/dry, no edema  Skin: no rashes or lesions    Resolved Hospital Problem list   Bowel Obstruction/ileus, Acute Kidney Injury from ATN 2nd to Cardiogenic shock, Aspiration Pneumonia  Hyponatremia  Assessment & Plan:   PEA 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  Not likely to return to independent living.  -supportive care   Hx of Rt MCA CVA Myoclonic Jerking -continue klonopin 0.5mg  TID PRN  -add keppra 1gm, 500 mg BID for intermittent shaking episodes (increased off keppra)  Acute Systolic/Diastolic CHF after Cardiac Arrest -lopressor 50mg  Q8 + lasix  -hold losartan, spironolactone given rise in sr Cr + hyperkalemia -follow I/O -continue current lasix dosing, may need to reduce 9/2 pending review   Acute Hypoxic Respiratory Failure from Cardiac Arrest and  Aspiration Pneumonia Failure to wean from ventilator s/p tracheostomy Centrilobular Emphysema  -PRVC 8cc/kg as rest mode  -PSV wean as tolerated with goal for ATC if possible  -trach care per protocol -Continue borvana, yupelri, PRN duoneb  Leukocytosis  Has had persistent episodes of diaphoresis, tachycardia.  Likely neuro related.  -WBC increased, follow trend. ? Getting near hypovolemia with diuretics vs new  infection. No clear source - PEG site clean, trach clean/no secretions, no secretions from [place]. No foley.   Hyperkalemia  S/p lokelma + insulin / dextrose 9/1 am  -hold losartan, spiro as above -follow up BMP at 1600  DM type 2 poorly controlled with hyperglycemia A1c 5.8 on admit. -follow glucose trend   Anemia of critical illness -trend CBC -transfuse for Hgb <7% or active bleeding   Dysphagia -PEG care per protocol  -TF per Nutrition   Oral Candidiasis -continue fluconazole, D8/10   LUL Nodular Density 2.6 x 2.1 cm abutting pleura. Seen on CT chest 04/29/21 -follow up CT mid September   Best practice:  Diet/type: tube feeds DVT prophylaxis: SQ heparin GI prophylaxis: protonix Code Status: DNR Disposition: ICU  Critical Care Time: 32 minutes    Canary Brim, MSN, APRN, NP-C, AGACNP-BC [place] [facility] 05/29/2021, 8:39 AM   Please see [email or web address] for pager details.   From 7A-7P if no response, please call [phone] After hours, please call ELink [phone]

## 2021-05-29 NOTE — Progress Notes (Signed)
[facility] P[facility]M Evening Rounds  Today has been a bad day for Vincent [name] in that he has had a fever, high heart rate and worsening renal function with hyperkalemia We have treated what appears to be seizure activity, but haven't confirmed that this is seizure His agitation and convulsive movements seem to respond more to fentanyl than to anti-seizure medicines  EEG pending now Will order fentanyl to help with ventilator synchrony Treat hyperkalemia with medical therapy: fluids, calcium, D50, insulin If hyperkalemia worsens, will not offer hemodialysis, he is not a dialysis candidate with severe anoxic brain injury, ventilator dependence and severe heart and lung failure.    Updated sister and advised that he may not survive the next 1-2 days.  She was appreciative of our care.  Code status: DNR  Heber [place], MD [place] [facility] Pager: [phone] Cell: [phone] After 7:00 pm call Elink  [phone]

## 2021-05-29 NOTE — Progress Notes (Signed)
eLink Physician-Brief Progress Note Patient Name: Vincent [name] DOB: 12-18-1970 MRN: [id number]   Date of Service  05/29/2021  HPI/Events of Note  K+ 6.1  eICU Interventions  Hyperkalemia treatment protocol ordered (Lokelma 10 gm + D 50 % water x 1 amp + Novolin 5 units SQ x 1 + hourly CBG x 6 hours).        Thomasene Lot Aleera Gilcrease 05/29/2021, 6:40 AM

## 2021-05-30 ENCOUNTER — Inpatient Hospital Stay (HOSPITAL_COMMUNITY): Payer: Medicaid Other

## 2021-05-30 DIAGNOSIS — G931 Anoxic brain damage, not elsewhere classified: Secondary | ICD-10-CM | POA: Diagnosis not present

## 2021-05-30 DIAGNOSIS — J9601 Acute respiratory failure with hypoxia: Secondary | ICD-10-CM | POA: Diagnosis not present

## 2021-05-30 LAB — CBC
HCT: 42.7 % (ref 39.0–52.0)
Hemoglobin: 13.7 g/dL (ref 13.0–17.0)
MCH: 28.9 pg (ref 26.0–34.0)
MCHC: 32.1 g/dL (ref 30.0–36.0)
MCV: 90.1 fL (ref 80.0–100.0)
Platelets: 453 10*3/uL — ABNORMAL HIGH (ref 150–400)
RBC: 4.74 MIL/uL (ref 4.22–5.81)
RDW: 14.6 % (ref 11.5–15.5)
WBC: 17.5 10*3/uL — ABNORMAL HIGH (ref 4.0–10.5)
nRBC: 0 % (ref 0.0–0.2)

## 2021-05-30 LAB — GLUCOSE, CAPILLARY
Glucose-Capillary: 143 mg/dL — ABNORMAL HIGH (ref 70–99)
Glucose-Capillary: 186 mg/dL — ABNORMAL HIGH (ref 70–99)

## 2021-05-30 LAB — BASIC METABOLIC PANEL
Anion gap: 18 — ABNORMAL HIGH (ref 5–15)
Anion gap: 14 (ref 5–15)
BUN: 73 mg/dL — ABNORMAL HIGH (ref 6–20)
BUN: 69 mg/dL — ABNORMAL HIGH (ref 6–20)
CO2: 24 mmol/L (ref 22–32)
CO2: 23 mmol/L (ref 22–32)
Calcium: 10.8 mg/dL — ABNORMAL HIGH (ref 8.9–10.3)
Calcium: 10.2 mg/dL (ref 8.9–10.3)
Chloride: 100 mmol/L (ref 98–111)
Chloride: 104 mmol/L (ref 98–111)
Creatinine, Ser: 1.95 mg/dL — ABNORMAL HIGH (ref 0.61–1.24)
Creatinine, Ser: 1.32 mg/dL — ABNORMAL HIGH (ref 0.61–1.24)
GFR, Estimated: 41 mL/min — ABNORMAL LOW (ref >60)
GFR, Estimated: >60 mL/min (ref >60)
Glucose, Bld: 136 mg/dL — ABNORMAL HIGH (ref 70–99)
Glucose, Bld: 166 mg/dL — ABNORMAL HIGH (ref 70–99)
Potassium: 6.4 mmol/L (ref 3.5–5.1)
Potassium: 4.7 mmol/L (ref 3.5–5.1)
Sodium: 142 mmol/L (ref 135–145)
Sodium: 141 mmol/L (ref 135–145)

## 2021-05-30 LAB — PROCALCITONIN: Procalcitonin: 0.96 ng/mL

## 2021-05-30 MED ORDER — MIDAZOLAM HCL 2 MG/2ML IJ SOLN
1.0000 mg | INTRAMUSCULAR | Status: DC | PRN
  Administered 2021-05-30 – 2021-05-31 (×7): 1 mg via INTRAVENOUS
  Filled 2021-05-30 (×7): qty 2

## 2021-05-30 MED ORDER — FENTANYL CITRATE (PF) 100 MCG/2ML IJ SOLN
25.0000 ug | INTRAMUSCULAR | Status: DC | PRN
  Administered 2021-05-30 – 2021-05-31 (×9): 50 ug via INTRAVENOUS
  Filled 2021-05-30 (×9): qty 2

## 2021-05-30 MED ORDER — ORAL CARE MOUTH RINSE
15.0000 mL | Freq: Four times a day (QID) | OROMUCOSAL | Status: DC
  Administered 2021-05-30 – 2021-05-31 (×6): 15 mL via OROMUCOSAL

## 2021-05-30 MED ORDER — INSULIN ASPART 100 UNIT/ML IJ SOLN
2.0000 [IU] | Freq: Once | INTRAMUSCULAR | Status: AC
  Administered 2021-05-30: 2 [IU] via SUBCUTANEOUS

## 2021-05-30 MED ORDER — OXYCODONE HCL 5 MG PO TABS
2.5000 mg | ORAL_TABLET | Freq: Four times a day (QID) | ORAL | Status: DC
  Administered 2021-05-30 – 2021-06-01 (×8): 2.5 mg
  Filled 2021-05-30 (×8): qty 1

## 2021-05-30 NOTE — Progress Notes (Signed)
eLink Physician-Brief Progress Note Patient Name: Vincent [name] DOB: Jun 21, 1971 MRN: [id number]   Date of Service  05/30/2021  HPI/Events of Note  CBG 186 mg / dl, bedside RN asking for Insulin coverage.  eICU Interventions  Novolog 2 units SQ x 1 ordered.        Thomasene Lot Zahari Fazzino 05/30/2021, 1:48 AM

## 2021-05-30 NOTE — Progress Notes (Addendum)
NAME:  Vincent [name], MRN:  [id number], DOB:  1971-07-14, LOS: 31 ADMISSION DATE:  04/29/2021, CONSULTATION DATE:  04/29/21 REFERRING MD:  Dr. Jacqulyn Bath, CHIEF COMPLAINT:  Found Down   History of present illness   50 y/o male smoker admitted with PEA cardiac arrest with ROSC after 6 minutes.  CT head showed old infarct. CT chest showed b/l dense lower lobe consolidation with changes of emphysema. UDS positive for benzodiazepines and THC.  Past Medical History  Emphysema, CVA, DM type 2  Significant Hospital Events   8/2 PEA Arrest, King airway in the field, CPR x6 minutes and epi x1, ROSC, Intubated in ED, Admitted to PCCM  8/4 Weaned off TTM, Coox O2 sat of 47%, started on milrinone and lasix 8/5 milrinone increased 8/6 TF stopped, OG placed to suction for ileus 8/8 Post pyloric Cortrak placed, myoclonic jerking, started Keppra, depakote 8/10 Completed antibiotic course for pneumonia. Family meeting transitioned to DNR. 8/15 Tracheostomy  8/18 Febrile overnight, started Zosyn for aspiration pneumonia 8/22 Completed Abx course for aspiration pneumonia  8/24 PEG placed 8/25 ABX started for fevers and ? Free air in abdomen. CT non-acute.  8/31 PSV wean 2 hours 9/1 Stopped lasix, farxiga, losartan, spiro with AKI. Ongoing jerking, diaphoresis. WBC increased, cultured / abx started empirically.  PCT increased. CK 2,450 9/2 Sr Cr improved   Interim history/subjective:  Afebrile  Cr improved  Glucose range 143-186 Vent needs unchanged - PEEP 5, fiO2 40% I/O 1.9L UOP, +1.8L in last 24 hours   Objective   Blood pressure (!) 132/94, pulse 92, temperature 98.2 F (36.8 C), temperature source Core, resp. rate (!) 33, height 5\' 8"  (1.727 m), weight 66.3 kg, SpO2 99 %.    Vent Mode: PSV;CPAP FiO2 (%):  [40 %-70 %] 40 % Set Rate:  [14 bmp] 14 bmp Vt Set:  [550 mL] 550 mL PEEP:  [5 cmH20] 5 cmH20 Pressure Support:  [5 cmH20] 5 cmH20 Plateau Pressure:  [13 cmH20-15 cmH20] 15 cmH20    Intake/Output Summary (Last 24 hours) at 05/30/2021 0839 Last data filed at 05/30/2021 0800 Gross per 24 hour  Intake 3815.74 ml  Output 1975 ml  Net 1840.74 ml   Filed Weights   05/25/21 0349 05/26/21 0134 05/27/21 0500  Weight: 61.7 kg 65.3 kg 66.3 kg   Exam: General: chronically ill appearing male lying in bed in NAD  HEENT: MM pink/moist, trach midline c/d/I, anicteric Neuro: awake, alert, looks around room, moves spontaneously in bed  CV: s1s2 RRR, SR currently (mixed with ST), no m/r/g PULM: non-labored on PSV 5/5, lungs clear bilaterally  GI: soft, bsx4 active  Extremities: warm/dry, no edema  Skin: no rashes or lesions   Resolved Hospital Problem list   Bowel Obstruction/ileus, Acute Kidney Injury from ATN 2nd to Cardiogenic shock, Aspiration Pneumonia  Hyponatremia  Assessment & Plan:   PEA 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  Not likely to return to independent living.  -continue klonopin PRN, keppra, versed  -repeat EEG 9/1 showed no seizure or epileptiform discharge   Hx of Rt MCA CVA Myoclonic Jerking -continue klonopin 0.5mg  TID PRN  -add keppra 1gm, 500 mg BID for intermittent shaking episodes (increased off keppra) -add scheduled low dose oxycodone  Acute Systolic/Diastolic CHF after Cardiac Arrest -continue lopressor  -hold losartan, spironolactone, farxiga  -hold lasix 9/2, reassess 9/3 for need based on exam -follow I/O   Acute Hypoxic Respiratory Failure from Cardiac Arrest  and Aspiration Pneumonia Failure to wean from ventilator s/p tracheostomy Centrilobular Emphysema  -PRVC 8cc/kg as rest mode  -PSV as tolerated  -goal would be for ATC but not sure mental status will permit  -trach care per protocol  -brovana, yupelri + PRN duoneb   Leukocytosis  Has had persistent episodes of diaphoresis, tachycardia.  Likely neuro related, but r/o infections etiology.  -stop empiric  cefepime, vancomycin  -follow cultures -CXR negative 9/2  -PCT 0.96 -? If symptoms related to narcotic withdrawal, significant improvement with fentanyl  Hyperkalemia  S/p lokelma + insulin / dextrose 9/1 am  -improved 9/2  -hold losartan, spiro as above  DM type 2 poorly controlled with hyperglycemia A1c 5.8 on admit. -follow trend   Anemia of critical illness -follow CBC -transfuse for Hgb <7%, active bleeding   Dysphagia -PEG care per protocol  -TF per Nutrition   Oral Candidiasis -fluconazole, D9/10   LUL Nodular Density 2.6 x 2.1 cm abutting pleura. Seen on CT chest 04/29/21 -follow up imaging / CT chest mid to late September    Best practice:  Diet/type: tube feeds DVT prophylaxis: SQ heparin GI prophylaxis: protonix Code Status: DNR Disposition: ICU  Critical Care Time: 34 minutes    Canary Brim, MSN, APRN, NP-C, AGACNP-BC [facility] 05/30/2021, 8:39 AM   Please see [email or web address] for pager details.   From 7A-7P if no response, please call [phone] After hours, please call ELink [phone]

## 2021-05-30 NOTE — Progress Notes (Signed)
eLink Physician-Brief Progress Note Patient Name: Bensyn Galen Russman DOB: [birth year] MRN: [id number]   Date of Service  05/30/2021  HPI/Events of Note  Patient with tachypnea up to the 40's, agitation, and myoclonic jerking, NG tube Klonopin and Oxycodone have not materially impacted these symptoms.  eICU Interventions  Low dose PRN Fentanyl and Versed ordered.        Thomasene Lot Lerone Onder 05/30/2021, 3:09 AM

## 2021-05-30 NOTE — TOC Progression Note (Signed)
Transition of Care [facility]) - Progression Note    Patient Details  Name: Vincent [name] MRN: [id number] Date of Birth: 1971/09/03  [facility] CM/SW Contact  Ralene Bathe, LCSWA Phone Number: 05/30/2021, 10:02 AM  Clinical Narrative:    10:02-  CSW contacted [facility] [phone] to inquire as to whether they accept managed medicaid.  CSW left a message for Waymond Cera in the business office requesting a returned call.   Expected Discharge Plan: Skilled Nursing Facility (Resides with sister ( W/C bound)) Barriers to Discharge: Vent Bed not available  Expected Discharge Plan and Services Expected Discharge Plan: Skilled Nursing Facility (Resides with sister ( W/C bound))   Discharge Planning Services: CM Consult                                           Social Determinants of Health (SDOH) Interventions    Readmission Risk Interventions No flowsheet data found.

## 2021-05-31 DIAGNOSIS — J9601 Acute respiratory failure with hypoxia: Secondary | ICD-10-CM | POA: Diagnosis not present

## 2021-05-31 DIAGNOSIS — G931 Anoxic brain damage, not elsewhere classified: Secondary | ICD-10-CM | POA: Diagnosis not present

## 2021-05-31 LAB — CBC
HCT: 36.7 % — ABNORMAL LOW (ref 39.0–52.0)
Hemoglobin: 11.5 g/dL — ABNORMAL LOW (ref 13.0–17.0)
MCH: 28.3 pg (ref 26.0–34.0)
MCHC: 31.3 g/dL (ref 30.0–36.0)
MCV: 90.4 fL (ref 80.0–100.0)
Platelets: 350 10*3/uL (ref 150–400)
RBC: 4.06 MIL/uL — ABNORMAL LOW (ref 4.22–5.81)
RDW: 14.6 % (ref 11.5–15.5)
WBC: 14.4 10*3/uL — ABNORMAL HIGH (ref 4.0–10.5)
nRBC: 0 % (ref 0.0–0.2)

## 2021-05-31 LAB — GLUCOSE, CAPILLARY: Glucose-Capillary: 134 mg/dL — ABNORMAL HIGH (ref 70–99)

## 2021-05-31 LAB — BASIC METABOLIC PANEL
Anion gap: 8 (ref 5–15)
BUN: 38 mg/dL — ABNORMAL HIGH (ref 6–20)
CO2: 24 mmol/L (ref 22–32)
Calcium: 9.4 mg/dL (ref 8.9–10.3)
Chloride: 109 mmol/L (ref 98–111)
Creatinine, Ser: 0.84 mg/dL (ref 0.61–1.24)
GFR, Estimated: >60 mL/min (ref >60)
Glucose, Bld: 142 mg/dL — ABNORMAL HIGH (ref 70–99)
Potassium: 4.3 mmol/L (ref 3.5–5.1)
Sodium: 141 mmol/L (ref 135–145)

## 2021-05-31 LAB — PROCALCITONIN: Procalcitonin: 1.04 ng/mL

## 2021-05-31 MED ORDER — SIMETHICONE 40 MG/0.6ML PO SUSP
40.0000 mg | Freq: Four times a day (QID) | ORAL | Status: AC | PRN
  Administered 2021-05-31: 40 mg
  Filled 2021-05-31 (×2): qty 0.6

## 2021-05-31 MED ORDER — CHLORHEXIDINE GLUCONATE 0.12% ORAL RINSE (MEDLINE KIT)
15.0000 mL | Freq: Two times a day (BID) | OROMUCOSAL | Status: DC
Start: 2021-06-01 — End: 2021-06-12
  Administered 2021-06-01 – 2021-06-11 (×21): 15 mL via OROMUCOSAL

## 2021-05-31 MED ORDER — ORAL CARE MOUTH RINSE
15.0000 mL | OROMUCOSAL | Status: DC
  Administered 2021-06-01 – 2021-06-12 (×104): 15 mL via OROMUCOSAL

## 2021-05-31 MED ORDER — ALBUTEROL SULFATE (2.5 MG/3ML) 0.083% IN NEBU
2.5000 mg | INHALATION_SOLUTION | RESPIRATORY_TRACT | Status: DC | PRN
  Administered 2021-06-04: 2.5 mg via RESPIRATORY_TRACT
  Filled 2021-05-31: qty 3

## 2021-05-31 NOTE — Progress Notes (Signed)
NAME:  Vincent [name], MRN:  [id number], DOB:  09-Jan-1971, LOS: 32 ADMISSION DATE:  04/29/2021, CONSULTATION DATE:  04/29/21 REFERRING MD:  Dr. Jacqulyn Bath, CHIEF COMPLAINT:  Found Down   History of present illness   50 y/o male smoker admitted with PEA cardiac arrest with ROSC after 6 minutes.  CT head showed old infarct. CT chest showed b/l dense lower lobe consolidation with changes of emphysema. UDS positive for benzodiazepines and THC.  Past Medical History  Emphysema, CVA, DM type 2  Significant Hospital Events   8/2 PEA Arrest, [place] airway in the field, CPR x6 minutes and epi x1, ROSC, Intubated in ED, Admitted to PCCM  8/4 Weaned off TTM, Coox O2 sat of 47%, started on milrinone and lasix 8/5 milrinone increased 8/6 TF stopped, OG placed to suction for ileus 8/8 Post pyloric Cortrak placed, myoclonic jerking, started Keppra, depakote 8/10 Completed antibiotic course for pneumonia. Family meeting transitioned to DNR. 8/15 Tracheostomy  8/18 Febrile overnight, started Zosyn for aspiration pneumonia 8/22 Completed Abx course for aspiration pneumonia  8/24 PEG placed 8/25 ABX started for fevers and ? Free air in abdomen. CT non-acute.  8/31 PSV wean 2 hours 9/1 Stopped lasix, farxiga, losartan, spiro with AKI. Ongoing jerking, diaphoresis. WBC increased, cultured / abx started empirically.  PCT increased. CK 2,450 9/2 start scheduled opiates for possible opiate withdrawal symptoms  Interim history/subjective:  Tm 99.6.  received fentanyl and versed overnight.  Objective   Blood pressure 125/90, pulse 79, temperature 99.6 F (37.6 C), temperature source Core, resp. rate (!) 25, height 5\' 8"  (1.727 m), weight 66.3 kg, SpO2 100 %.    Vent Mode: PSV FiO2 (%):  [40 %] 40 % Set Rate:  [14 bmp] 14 bmp Vt Set:  [550 mL] 550 mL PEEP:  [5 cmH20] 5 cmH20 Pressure Support:  [5 cmH20-10 cmH20] 10 cmH20 Plateau Pressure:  [17 cmH20-30 cmH20] 18 cmH20   Intake/Output Summary (Last 24  hours) at 05/31/2021 1113 Last data filed at 05/31/2021 1100 Gross per 24 hour  Intake 4711.34 ml  Output 2425 ml  Net 2286.34 ml   Filed Weights   05/25/21 0349 05/26/21 0134 05/27/21 0500  Weight: 61.7 kg 65.3 kg 66.3 kg   Exam:  General - stares into space Eyes - pupils reactive ENT - trach site clean Cardiac - regular rate/rhythm, no murmur Chest - equal breath sounds b/l, no wheezing or rales Abdomen - soft, non tender, + bowel sounds Extremities - 1+ edema Skin - no rashes Neuro - vegetative state  Resolved Hospital Problem list   Bowel Obstruction/ileus, Acute Kidney Injury from ATN 2nd to Cardiogenic shock, Aspiration Pneumonia, Hyponatremia  Assessment & Plan:   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 - continue keppra - prn klonopin, versed  Opiate withdrawal. - started scheduled oxycodone 9/02  Acute Systolic/Diastolic CHF after Cardiac Arrest. HLD. - conitnue lipitor, lopressor - lasix, losartan, spironolactone, farxiga held on 9/01 due to worsening renal function >> renal fx improving so might be able to slowly add back in soon  Acute Hypoxic Respiratory Failure from Cardiac Arrest and Aspiration Pneumonia. Failure to wean from ventilator s/p tracheostomy. Centrilobular Emphysema. - wean on pressure support as tolerated - f/u CXR intermittently - continue brovana, yupelri and prn albuterol  Anemia of critical illness - follow CBC - transfuse for Hgb <7%, active bleeding   Dysphagia - PEG care per protocol  - TF per Nutrition  Oral Candidiasis - fluconazole, Day 10 of 10  LUL Nodular Density 2.6 x 2.1 cm abutting pleura. - Seen on CT chest 04/29/21 -follow up imaging / CT chest mid to late September    Disposition. - limited options due to lack of adequate insurance coverage - TOC team looking into LTAC options, but might need to go out of state  Best practice:  Diet/type: tube  feeds DVT prophylaxis: SQ heparin GI prophylaxis: protonix Code Status: DNR Disposition: ICU  Labs:   CMP Latest Ref Rng & Units 05/31/2021 05/30/2021 05/29/2021  Glucose 70 - 99 mg/dL 109(N) 235(T) 732(K)  BUN 6 - 20 mg/dL 02(R) 42(H) 06(C)  Creatinine 0.61 - 1.24 mg/dL 3.76 2.83(T) 5.17(O)  Sodium 135 - 145 mmol/L 141 141 141  Potassium 3.5 - 5.1 mmol/L 4.3 4.7 4.8  Chloride 98 - 111 mmol/L 109 104 103  CO2 22 - 32 mmol/L 24 23 22   Calcium 8.9 - 10.3 mg/dL 9.4 10.4(H)  Total Protein 6.5 - 8.1 g/dL - - -  Total Bilirubin 0.3 - 1.2 mg/dL - - -  Alkaline Phos 38 - 126 U/L - - -  AST 15 - 41 U/L - - -  ALT 0 - 44 U/L - - -    CBC Latest Ref Rng & Units 05/31/2021 05/30/2021 05/29/2021  WBC 4.0 - 10.5 K/uL 14.4(H) 17.5(H) 16.1(H)  Hemoglobin 13.0 - 17.0 g/dL 11.5(L) 13.7 14.6  Hematocrit 39.0 - 52.0 % 36.7(L) 42.7 44.2  Platelets 150 - 400 K/uL 350 453(H) 548(H)    ABG    Component Value Date/Time   PHART 7.494 (H) 05/02/2021 1822   PCO2ART 30.6 (L) 05/02/2021 1822   PO2ART 95 05/02/2021 1822   HCO3 23.5 05/02/2021 1822   TCO2 24 05/02/2021 1822   ACIDBASEDEF 9.0 (H) 04/30/2021 0815   O2SAT 70.2 05/08/2021 0317   CBG (last 3)  Recent Labs    05/29/21 1953 05/30/21 0042 05/30/21 0345  GLUCAP 127* 186* 143*    Signature:  07/30/21, MD [facility] Pulmonary/Critical Care Pager - [phone] 05/31/2021, 11:29 AM

## 2021-05-31 NOTE — Progress Notes (Signed)
eLink Physician-Brief Progress Note Patient Name: Vincent [name] DOB: [birth year] MRN: [id number]   Date of Service  05/31/2021  HPI/Events of Note  Bedside RN asking for Fentanyl gtt to be started, however patient was just started on enteral Oxycodone yesterday.  eICU Interventions  Will allow time for Oxycodone enterally to kick in fully before considering that  it has not worked , and ordering a Fentanyl gtt.        Thomasene Lot [name] 05/31/2021, 4:37 AM

## 2021-06-01 DIAGNOSIS — G931 Anoxic brain damage, not elsewhere classified: Secondary | ICD-10-CM | POA: Diagnosis not present

## 2021-06-01 DIAGNOSIS — I469 Cardiac arrest, cause unspecified: Secondary | ICD-10-CM | POA: Diagnosis not present

## 2021-06-01 DIAGNOSIS — J9601 Acute respiratory failure with hypoxia: Secondary | ICD-10-CM | POA: Diagnosis not present

## 2021-06-01 LAB — GLUCOSE, CAPILLARY
Glucose-Capillary: 128 mg/dL — ABNORMAL HIGH (ref 70–99)
Glucose-Capillary: 135 mg/dL — ABNORMAL HIGH (ref 70–99)

## 2021-06-01 LAB — CULTURE, RESPIRATORY W GRAM STAIN

## 2021-06-01 MED ORDER — CYCLOBENZAPRINE HCL 10 MG PO TABS
5.0000 mg | ORAL_TABLET | Freq: Three times a day (TID) | ORAL | Status: DC
  Administered 2021-06-01 – 2021-06-05 (×12): 5 mg via ORAL
  Filled 2021-06-01 (×13): qty 1

## 2021-06-01 MED ORDER — OXYCODONE HCL 5 MG PO TABS
5.0000 mg | ORAL_TABLET | Freq: Four times a day (QID) | ORAL | Status: DC
  Administered 2021-06-01 – 2021-06-03 (×8): 5 mg
  Filled 2021-06-01 (×8): qty 1

## 2021-06-01 MED ORDER — MIDAZOLAM HCL 2 MG/2ML IJ SOLN
2.0000 mg | INTRAMUSCULAR | Status: DC | PRN
  Administered 2021-06-02 – 2021-06-11 (×10): 2 mg via INTRAVENOUS
  Filled 2021-06-01 (×10): qty 2

## 2021-06-01 MED ORDER — POLYETHYLENE GLYCOL 3350 17 G PO PACK
17.0000 g | PACK | Freq: Every day | ORAL | Status: DC
  Administered 2021-06-02 – 2021-06-24 (×12): 17 g
  Filled 2021-06-01 (×12): qty 1

## 2021-06-01 MED ORDER — DOCUSATE SODIUM 50 MG/5ML PO LIQD
200.0000 mg | Freq: Two times a day (BID) | ORAL | Status: DC
  Administered 2021-06-02 – 2021-07-17 (×62): 200 mg
  Filled 2021-06-01 (×89): qty 20

## 2021-06-01 MED ORDER — SENNA 8.6 MG PO TABS
1.0000 | ORAL_TABLET | Freq: Every day | ORAL | Status: DC
  Administered 2021-06-02 – 2021-06-24 (×14): 8.6 mg
  Filled 2021-06-01 (×14): qty 1

## 2021-06-01 MED ORDER — CLONAZEPAM 0.5 MG PO TABS
0.5000 mg | ORAL_TABLET | Freq: Four times a day (QID) | ORAL | Status: DC
  Administered 2021-06-01 – 2021-06-03 (×9): 0.5 mg
  Filled 2021-06-01 (×9): qty 1

## 2021-06-01 MED ORDER — FENTANYL CITRATE (PF) 100 MCG/2ML IJ SOLN: 50.0000 ug | INTRAMUSCULAR | Status: DC | PRN

## 2021-06-01 NOTE — Progress Notes (Addendum)
RT note- Placed back to full support due to elevated more than normal respiratory rate 40-50's, continue to monitor. Increase frothy secretions, RN aware.

## 2021-06-01 NOTE — Progress Notes (Signed)
NAME:  Vincent [name], MRN:  [id number], DOB:  07-Sep-1971, LOS: 33 ADMISSION DATE:  04/29/2021, CONSULTATION DATE:  04/29/21 REFERRING MD:  Dr. Jacqulyn Bath, CHIEF COMPLAINT:  Found Down   History of present illness   50 y/o male smoker admitted with PEA cardiac arrest with ROSC after 6 minutes.  CT head showed old infarct. CT chest showed b/l dense lower lobe consolidation with changes of emphysema. UDS positive for benzodiazepines and THC.  Past Medical History  Emphysema, CVA, DM type 2  Significant Hospital Events   8/2 PEA Arrest, [place] airway in the field, CPR x6 minutes and epi x1, ROSC, Intubated in ED, Admitted to PCCM  8/4 Weaned off TTM, Coox O2 sat of 47%, started on milrinone and lasix 8/5 milrinone increased 8/6 TF stopped, OG placed to suction for ileus 8/8 Post pyloric Cortrak placed, myoclonic jerking, started Keppra, depakote 8/10 Completed antibiotic course for pneumonia. Family meeting transitioned to DNR. 8/15 Tracheostomy  8/18 Febrile overnight, started Zosyn for aspiration pneumonia 8/22 Completed Abx course for aspiration pneumonia  8/24 PEG placed 8/25 ABX started for fevers and ? Free air in abdomen. CT non-acute.  8/31 PSV wean 2 hours 9/1 Stopped lasix, farxiga, losartan, spiro with AKI. Ongoing jerking, diaphoresis. WBC increased, cultured / abx started empirically.  PCT increased. CK 2,450 9/2 start scheduled opiates for possible opiate withdrawal symptoms  Interim history/subjective:  On pressure support this morning.  Still needing IV sedation and analgesia.  Objective   Blood pressure (!) 132/99, pulse (!) 101, temperature 99.9 F (37.7 C), temperature source Core, resp. rate (!) 30, height 5\' 8"  (1.727 m), weight 70.6 kg, SpO2 100 %.    Vent Mode: PSV;CPAP FiO2 (%):  [40 %] 40 % Set Rate:  [14 bmp] 14 bmp Vt Set:  [550 mL] 550 mL PEEP:  [5 cmH20] 5 cmH20 Pressure Support:  [10 cmH20] 10 cmH20 Plateau Pressure:  [16 cmH20] 16 cmH20    Intake/Output Summary (Last 24 hours) at 06/01/2021 0817 Last data filed at 06/01/2021 0700 Gross per 24 hour  Intake 1783.82 ml  Output 1700 ml  Net 83.82 ml   Filed Weights   05/26/21 0134 05/27/21 0500 06/01/21 0500  Weight: 65.3 kg 66.3 kg 70.6 kg   Exam:  General - vegetative state Eyes - pupils reactive ENT - trach site clean Cardiac - regular rate/rhythm, no murmur Chest - b/l rhonchi Abdomen - soft, non tender, + bowel sounds Extremities - no cyanosis, clubbing, or edema Skin - no rashes Neuro - not following commands  Resolved Hospital Problem list   Bowel Obstruction/ileus, Acute Kidney Injury from ATN 2nd to Cardiogenic shock, Aspiration Pneumonia, Hyponatremia, Oral candidiasis  Assessment & Plan:   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 - continue keppra - change to scheduled klonopin - prn versed  Opiate withdrawal. - increase oxycodone to 5 mg q6h on 9/04 - prn fentanyl  Acute Systolic/Diastolic CHF after Cardiac Arrest. HLD. - continue lipitor, lopressor  Acute Hypoxic Respiratory Failure from Cardiac Arrest and Aspiration Pneumonia. Failure to wean from ventilator s/p tracheostomy. Centrilobular Emphysema. - wean on pressure support to TC as able - f/u CXR intermittently - continue brovana, yupelri and prn albuterol  Anemia of critical illness - f/u CBC intermittently  Dysphagia - PEG care per protocol  - TF per Nutrition   LUL Nodular Density 2.6 x 2.1 cm abutting pleura. - Seen on CT chest 04/29/21  Disposition. -  limited options due to lack of adequate insurance coverage - TOC team looking into LTAC options, but might need to go out of state  Best practice:  Diet/type: tube feeds DVT prophylaxis: SQ heparin GI prophylaxis: protonix Code Status: DNR Disposition: ICU  Labs:   CMP Latest Ref Rng & Units 05/31/2021 05/30/2021 05/29/2021  Glucose 70 - 99 mg/dL 161(W) 960(A)  540(J)  BUN 6 - 20 mg/dL 81(X) 91(Y) 78(G)  Creatinine 0.61 - 1.24 mg/dL 9.56 2.13(Y) 8.65(H)  Sodium 135 - 145 mmol/L 141 141 141  Potassium 3.5 - 5.1 mmol/L 4.3 4.7 4.8  Chloride 98 - 111 mmol/L 109 104 103  CO2 22 - 32 mmol/L 24 23 22   Calcium 8.9 - 10.3 mg/dL 9.4 10.4(H)  Total Protein 6.5 - 8.1 g/dL - - -  Total Bilirubin 0.3 - 1.2 mg/dL - - -  Alkaline Phos 38 - 126 U/L - - -  AST 15 - 41 U/L - - -  ALT 0 - 44 U/L - - -    CBC Latest Ref Rng & Units 05/31/2021 05/30/2021 05/29/2021  WBC 4.0 - 10.5 K/uL 14.4(H) 17.5(H) 16.1(H)  Hemoglobin 13.0 - 17.0 g/dL 11.5(L) 13.7 14.6  Hematocrit 39.0 - 52.0 % 36.7(L) 42.7 44.2  Platelets 150 - 400 K/uL 350 453(H) 548(H)    ABG    Component Value Date/Time   PHART 7.494 (H) 05/02/2021 1822   PCO2ART 30.6 (L) 05/02/2021 1822   PO2ART 95 05/02/2021 1822   HCO3 23.5 05/02/2021 1822   TCO2 24 05/02/2021 1822   ACIDBASEDEF 9.0 (H) 04/30/2021 0815   O2SAT 70.2 05/08/2021 0317   CBG (last 3)  Recent Labs    05/30/21 0345 05/31/21 1530 06/01/21 0323  GLUCAP 143* 134* 128*    Signature:  08/01/21, MD [facility] Pulmonary/Critical Care Pager - [phone] 06/01/2021, 8:17 AM

## 2021-06-02 DIAGNOSIS — G931 Anoxic brain damage, not elsewhere classified: Secondary | ICD-10-CM | POA: Diagnosis not present

## 2021-06-02 DIAGNOSIS — J9601 Acute respiratory failure with hypoxia: Secondary | ICD-10-CM | POA: Diagnosis not present

## 2021-06-02 LAB — GLUCOSE, CAPILLARY
Glucose-Capillary: 90 mg/dL (ref 70–99)
Glucose-Capillary: 105 mg/dL — ABNORMAL HIGH (ref 70–99)
Glucose-Capillary: 107 mg/dL — ABNORMAL HIGH (ref 70–99)
Glucose-Capillary: 124 mg/dL — ABNORMAL HIGH (ref 70–99)
Glucose-Capillary: 132 mg/dL — ABNORMAL HIGH (ref 70–99)

## 2021-06-02 NOTE — TOC Progression Note (Signed)
Transition of Care [facility]) - Progression Note    Patient Details  Name: Vincent [name] MRN: [id number] Date of Birth: 05-25-1971  [facility] CM/SW Contact  Ralene Bathe, LCSWA Phone Number: 06/02/2021, 12:49 PM  Clinical Narrative:    CSW contacted [facility] [phone] to inquire as to whether they accept managed medicaid.  CSW was informed by the receptionist that CSW would need to speak with the admissions director, but they are out of the office today.   Expected Discharge Plan: Skilled Nursing Facility (Resides with sister ( W/C bound)) Barriers to Discharge: Vent Bed not available  Expected Discharge Plan and Services Expected Discharge Plan: Skilled Nursing Facility (Resides with sister ( W/C bound))   Discharge Planning Services: CM Consult                                           Social Determinants of Health (SDOH) Interventions    Readmission Risk Interventions No flowsheet data found.

## 2021-06-02 NOTE — Progress Notes (Addendum)
NAME:  Vincent [name], MRN:  [id number], DOB:  [birth year], LOS: 34 ADMISSION DATE:  04/29/2021, CONSULTATION DATE:  04/29/21 REFERRING MD:  Dr. Jacqulyn Bath, CHIEF COMPLAINT:  Found Down   History of present illness   50 y/o male smoker admitted with PEA cardiac arrest with ROSC after 6 minutes.  CT head showed old infarct. CT chest showed b/l dense lower lobe consolidation with changes of emphysema. UDS positive for benzodiazepines and THC.  Past Medical History  Emphysema, CVA, DM type 2  Significant Hospital Events   8/2 PEA Arrest, [place] airway in the field, CPR x6 minutes and epi x1, ROSC, Intubated in ED, Admitted to PCCM  8/4 Weaned off TTM, Coox O2 sat of 47%, started on milrinone and lasix 8/5 milrinone increased 8/6 TF stopped, OG placed to suction for ileus 8/8 Post pyloric Cortrak placed, myoclonic jerking, started Keppra, depakote 8/10 Completed antibiotic course for pneumonia. Family meeting transitioned to DNR. 8/15 Tracheostomy  8/18 Febrile overnight, started Zosyn for aspiration pneumonia 8/22 Completed Abx course for aspiration pneumonia  8/24 PEG placed 8/25 ABX started for fevers and ? Free air in abdomen. CT non-acute.  8/31 PSV wean 2 hours 9/1 Stopped lasix, farxiga, losartan, spiro with AKI. Ongoing jerking, diaphoresis. WBC increased, cultured / abx started empirically.  PCT increased. CK 2,450 9/2 start scheduled opiates for possible opiate withdrawal symptoms 9/4 add flexeril  Interim history/subjective:  On pressure support.  Only needed versed once overnight.  Objective   Blood pressure (!) 163/102, pulse (!) 117, temperature 99.1 F (37.3 C), temperature source Core, resp. rate (!) 23, height 5\' 8"  (1.727 m), weight 69.4 kg, SpO2 100 %.    Vent Mode: CPAP;PSV FiO2 (%):  [40 %] 40 % Set Rate:  [14 bmp] 14 bmp Vt Set:  [550 mL] 550 mL PEEP:  [5 cmH20] 5 cmH20 Pressure Support:  [5 cmH20-10 cmH20] 5 cmH20 Plateau Pressure:  [16 cmH20-18 cmH20] 18 cmH20    Intake/Output Summary (Last 24 hours) at 06/02/2021 1005 Last data filed at 06/02/2021 0900 Gross per 24 hour  Intake 1585 ml  Output 900 ml  Net 685 ml   Filed Weights   05/27/21 0500 06/01/21 0500 06/02/21 0500  Weight: 66.3 kg 70.6 kg 69.4 kg   Exam:  General - wiggly Eyes - pupils reactive ENT - trach site clean Cardiac - regular rate/rhythm, no murmur Chest - scattered rhonchi Abdomen - soft, non tender, + bowel sounds Extremities - no cyanosis, clubbing, or edema Skin - no rashes Neuro - doesn't follow commands  Resolved Hospital Problem list   Bowel Obstruction/ileus, Acute Kidney Injury from ATN 2nd to Cardiogenic shock, Aspiration Pneumonia, Hyponatremia, Oral candidiasis  Assessment & Plan:   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 - continue kloponin, keppra, flexeril - prn versed  Opiate withdrawal. - continue oxycodone  5 mg q6h - prn fentanyl  Acute Systolic/Diastolic CHF after Cardiac Arrest. HLD. - continue lipitor, lopressor  Acute Hypoxic Respiratory Failure from Cardiac Arrest and Aspiration Pneumonia. Failure to wean from ventilator s/p tracheostomy. Centrilobular Emphysema. - wean on pressure support to TC as able - f/u CXR intermittently - continue brovana, yupelri and prn albuterol  ESBL Klebsiella in sputum from 05/29/21. - contact isolation  Anemia of critical illness - f/u CBC intermittently  Dysphagia - PEG care per protocol  - TF per Nutrition   LUL Nodular Density 2.6 x 2.1 cm abutting pleura. - Seen on  CT chest 04/29/21  Disposition. - limited options due to lack of adequate insurance coverage - TOC team looking into LTAC options, but might need to go out of state  Best practice:  Diet/type: tube feeds DVT prophylaxis: SQ heparin GI prophylaxis: protonix Code Status: DNR Disposition: ICU  Labs:   CMP Latest Ref Rng & Units 05/31/2021 05/30/2021 05/29/2021   Glucose 70 - 99 mg/dL 025(K) 270(W) 237(S)  BUN 6 - 20 mg/dL 28(B) 15(V) 76(H)  Creatinine 0.61 - 1.24 mg/dL 6.07 3.71(G) 6.26(R)  Sodium 135 - 145 mmol/L 141 141 141  Potassium 3.5 - 5.1 mmol/L 4.3 4.7 4.8  Chloride 98 - 111 mmol/L 109 104 103  CO2 22 - 32 mmol/L 24 23 22   Calcium 8.9 - 10.3 mg/dL 9.4 10.4(H)  Total Protein 6.5 - 8.1 g/dL - - -  Total Bilirubin 0.3 - 1.2 mg/dL - - -  Alkaline Phos 38 - 126 U/L - - -  AST 15 - 41 U/L - - -  ALT 0 - 44 U/L - - -    CBC Latest Ref Rng & Units 05/31/2021 05/30/2021 05/29/2021  WBC 4.0 - 10.5 K/uL 14.4(H) 17.5(H) 16.1(H)  Hemoglobin 13.0 - 17.0 g/dL 11.5(L) 13.7 14.6  Hematocrit 39.0 - 52.0 % 36.7(L) 42.7 44.2  Platelets 150 - 400 K/uL 350 453(H) 548(H)    ABG    Component Value Date/Time   PHART 7.494 (H) 05/02/2021 1822   PCO2ART 30.6 (L) 05/02/2021 1822   PO2ART 95 05/02/2021 1822   HCO3 23.5 05/02/2021 1822   TCO2 24 05/02/2021 1822   ACIDBASEDEF 9.0 (H) 04/30/2021 0815   O2SAT 70.2 05/08/2021 0317   CBG (last 3)  Recent Labs    06/01/21 1611 06/02/21 0326 06/02/21 0747  GLUCAP 135* 90 105*    Signature:  08/02/21, MD [facility] Pulmonary/Critical Care Pager - [phone] 06/02/2021, 10:05 AM

## 2021-06-03 DIAGNOSIS — I469 Cardiac arrest, cause unspecified: Secondary | ICD-10-CM | POA: Diagnosis not present

## 2021-06-03 DIAGNOSIS — N179 Acute kidney failure, unspecified: Secondary | ICD-10-CM | POA: Diagnosis not present

## 2021-06-03 DIAGNOSIS — G931 Anoxic brain damage, not elsewhere classified: Secondary | ICD-10-CM | POA: Diagnosis not present

## 2021-06-03 DIAGNOSIS — J9601 Acute respiratory failure with hypoxia: Secondary | ICD-10-CM | POA: Diagnosis not present

## 2021-06-03 LAB — CULTURE, BLOOD (ROUTINE X 2)
Culture: NO GROWTH
Culture: NO GROWTH
Special Requests: ADEQUATE
Special Requests: ADEQUATE

## 2021-06-03 LAB — GLUCOSE, CAPILLARY
Glucose-Capillary: 125 mg/dL — ABNORMAL HIGH (ref 70–99)
Glucose-Capillary: 126 mg/dL — ABNORMAL HIGH (ref 70–99)
Glucose-Capillary: 120 mg/dL — ABNORMAL HIGH (ref 70–99)
Glucose-Capillary: 116 mg/dL — ABNORMAL HIGH (ref 70–99)

## 2021-06-03 MED ORDER — OXYCODONE HCL 5 MG PO TABS
5.0000 mg | ORAL_TABLET | Freq: Four times a day (QID) | ORAL | Status: DC
  Administered 2021-06-03 – 2021-06-12 (×36): 5 mg
  Filled 2021-06-03 (×36): qty 1

## 2021-06-03 MED ORDER — CLONAZEPAM 0.5 MG PO TABS
0.5000 mg | ORAL_TABLET | Freq: Four times a day (QID) | ORAL | Status: DC
  Administered 2021-06-03 – 2021-06-12 (×35): 0.5 mg
  Filled 2021-06-03 (×35): qty 1

## 2021-06-03 MED ORDER — BUDESONIDE 0.5 MG/2ML IN SUSP
0.5000 mg | Freq: Two times a day (BID) | RESPIRATORY_TRACT | Status: DC
  Administered 2021-06-03 – 2021-07-22 (×97): 0.5 mg via RESPIRATORY_TRACT
  Filled 2021-06-03 (×98): qty 2

## 2021-06-03 NOTE — Plan of Care (Signed)
Patient remains on vent at this time did tolerate cpap wean this shift.  Still remains non-responsive to verbal stimuli but will withdraw/extend to painful stimuli.  Tube feedings continues and tolerating well.  Large stool this shift.  Cooling unit remains in place with rectal temp.  No family contact this shift.   Problem: Activity: Goal: Ability to tolerate increased activity will improve Outcome: Not Progressing   Problem: Respiratory: Goal: Ability to maintain a clear airway and adequate ventilation will improve Outcome: Not Progressing   Problem: Role Relationship: Goal: Method of communication will improve Outcome: Not Progressing   Problem: Education: Goal: Knowledge of General Education information will improve Description: Including pain rating scale, medication(s)/side effects and non-pharmacologic comfort measures Outcome: Not Progressing   Problem: Health Behavior/Discharge Planning: Goal: Ability to manage health-related needs will improve Outcome: Not Progressing   Problem: Clinical Measurements: Goal: Ability to maintain clinical measurements within normal limits will improve Outcome: Not Progressing Goal: Will remain free from infection Outcome: Not Progressing Goal: Diagnostic test results will improve Outcome: Not Progressing Goal: Respiratory complications will improve Outcome: Not Progressing Goal: Cardiovascular complication will be avoided Outcome: Not Progressing   Problem: Activity: Goal: Risk for activity intolerance will decrease Outcome: Not Progressing   Problem: Nutrition: Goal: Adequate nutrition will be maintained Outcome: Not Progressing   Problem: Coping: Goal: Level of anxiety will decrease Outcome: Not Progressing   Problem: Elimination: Goal: Will not experience complications related to bowel motility Outcome: Not Progressing Goal: Will not experience complications related to urinary retention Outcome: Not Progressing    Problem: Pain Managment: Goal: General experience of comfort will improve Outcome: Not Progressing   Problem: Safety: Goal: Ability to remain free from injury will improve Outcome: Not Progressing   Problem: Skin Integrity: Goal: Risk for impaired skin integrity will decrease Outcome: Not Progressing

## 2021-06-03 NOTE — Progress Notes (Signed)
NAME:  Vincent [name], MRN:  [id number], DOB:  03-21-1971, LOS: 35 ADMISSION DATE:  04/29/2021, CONSULTATION DATE:  04/29/21 REFERRING MD:  Dr. Jacqulyn Bath, CHIEF COMPLAINT:  Found Down   History of present illness   50 y/o male smoker admitted with PEA cardiac arrest with ROSC after 6 minutes.  CT head showed old infarct. CT chest showed b/l dense lower lobe consolidation with changes of emphysema. UDS positive for benzodiazepines and THC.  Past Medical History  Emphysema, CVA, DM type 2  Significant Hospital Events   8/2 PEA Arrest, [place] airway in the field, CPR x6 minutes and epi x1, ROSC, Intubated in ED, Admitted to PCCM  8/4 Weaned off TTM, Coox O2 sat of 47%, started on milrinone and lasix 8/5 milrinone increased 8/6 TF stopped, OG placed to suction for ileus 8/8 Post pyloric Cortrak placed, myoclonic jerking, started Keppra, depakote 8/10 Completed antibiotic course for pneumonia. Family meeting transitioned to DNR. 8/15 Tracheostomy  8/18 Febrile overnight, started Zosyn for aspiration pneumonia 8/22 Completed Abx course for aspiration pneumonia  8/24 PEG placed 8/25 ABX started for fevers and ? Free air in abdomen. CT non-acute.  8/31 PSV wean 2 hours 9/1 Stopped lasix, farxiga, losartan, spiro with AKI. Ongoing jerking, diaphoresis. WBC increased, cultured / abx started empirically.  PCT increased. CK 2,450 9/2 start scheduled opiates for possible opiate withdrawal symptoms 9/4 add flexeril, on PSV.  Versed x1 overnight.  Interim history/subjective:  Afebrile  RN reports no acute events overnight  On PSV wean 5/5   Objective   Blood pressure 130/71, pulse (!) 116, temperature 98.6 F (37 C), temperature source Core, resp. rate (!) 26, height 5\' 8"  (1.727 m), weight 68.3 kg, SpO2 100 %.    Vent Mode: PRVC FiO2 (%):  [40 %] 40 % Set Rate:  [14 bmp] 14 bmp Vt Set:  [550 mL] 550 mL PEEP:  [5 cmH20] 5 cmH20 Pressure Support:  [5 cmH20] 5 cmH20 Plateau Pressure:  [12  cmH20-17 cmH20] 17 cmH20   Intake/Output Summary (Last 24 hours) at 06/03/2021 0746 Last data filed at 06/03/2021 0600 Gross per 24 hour  Intake 2350.89 ml  Output 975 ml  Net 1375.89 ml   Filed Weights   06/01/21 0500 06/02/21 0500 06/03/21 0500  Weight: 70.6 kg 69.4 kg 68.3 kg   Exam: General: chronically ill appearing adult male lying in bed on vent  HEENT: MM pink/moist, trach clean / midline Neuro: opens eyes spontaneously, dose not track or follow commands  CV: s1s2 RRR, no m/r/g PULM: non-labored on PSV, lungs bilaterally coarse GI: soft, bsx4 active, PEG  Extremities: warm/dry, no edema  Skin: no rashes or lesions  Resolved Hospital Problem list   Bowel Obstruction/ileus, Acute Kidney Injury from ATN 2nd to Cardiogenic shock, Aspiration Pneumonia, Hyponatremia, Oral candidiasis  Assessment & Plan:   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 -PRN versed for abnormal motor movements  -continue klonopin, flexeril, keppra   Opiate withdrawal. -oxycodone 5mg  Q6 PT  -PRN fentanyl   Acute Systolic/Diastolic CHF after Cardiac Arrest. HLD. -continue lopressor, liptor   Acute Hypoxic Respiratory Failure from Cardiac Arrest and Aspiration Pneumonia. Failure to wean from ventilator s/p tracheostomy. Centrilobular Emphysema. -PSV wean as tolerated -goal for ATC if able  -PRVC overnight / rest mode  -follow intermittent CXR  -continue yupelri, brovana, PRN albuterol   ESBL Klebsiella in sputum from 05/29/21. -contact isolation  -follow, suspect colonized   Anemia of  critical illness -trend CBC   Dysphagia -TF per nutrition  -PEG care per protocol   LUL Nodular Density 2.6 x 2.1 cm abutting pleura. Seen on CT chest 04/29/21 -will need follow up imaging   Disposition. -Limited options due to insurance coverage -TOC assessing potential discharge plan, may have to be placed out of state   Best practice:   Diet/type: tube feeds DVT prophylaxis: SQ heparin GI prophylaxis: protonix Code Status: DNR Disposition: ICU  Critical Care Time: 31 minutes    Canary Brim, MSN, APRN, NP-C, AGACNP-BC [facility] 06/03/2021, 7:46 AM   Please see [email or web address] for pager details.   From 7A-7P if no response, please call [phone] After hours, please call ELink [phone]

## 2021-06-04 DIAGNOSIS — I469 Cardiac arrest, cause unspecified: Secondary | ICD-10-CM | POA: Diagnosis not present

## 2021-06-04 LAB — CBC
HCT: 37.3 % — ABNORMAL LOW (ref 39.0–52.0)
Hemoglobin: 12.4 g/dL — ABNORMAL LOW (ref 13.0–17.0)
MCH: 29 pg (ref 26.0–34.0)
MCHC: 33.2 g/dL (ref 30.0–36.0)
MCV: 87.4 fL (ref 80.0–100.0)
Platelets: 304 10*3/uL (ref 150–400)
RBC: 4.27 MIL/uL (ref 4.22–5.81)
RDW: 13.8 % (ref 11.5–15.5)
WBC: 11.2 10*3/uL — ABNORMAL HIGH (ref 4.0–10.5)
nRBC: 0 % (ref 0.0–0.2)

## 2021-06-04 LAB — BASIC METABOLIC PANEL
Anion gap: 11 (ref 5–15)
BUN: 24 mg/dL — ABNORMAL HIGH (ref 6–20)
CO2: 27 mmol/L (ref 22–32)
Calcium: 9.8 mg/dL (ref 8.9–10.3)
Chloride: 94 mmol/L — ABNORMAL LOW (ref 98–111)
Creatinine, Ser: 0.75 mg/dL (ref 0.61–1.24)
GFR, Estimated: >60 mL/min (ref >60)
Glucose, Bld: 143 mg/dL — ABNORMAL HIGH (ref 70–99)
Potassium: 4.8 mmol/L (ref 3.5–5.1)
Sodium: 132 mmol/L — ABNORMAL LOW (ref 135–145)

## 2021-06-04 LAB — GLUCOSE, CAPILLARY
Glucose-Capillary: 110 mg/dL — ABNORMAL HIGH (ref 70–99)
Glucose-Capillary: 109 mg/dL — ABNORMAL HIGH (ref 70–99)

## 2021-06-04 MED ORDER — GLYCOPYRROLATE 0.2 MG/ML IJ SOLN
0.2000 mg | Freq: Once | INTRAMUSCULAR | Status: AC
  Administered 2021-06-05: 0.2 mg via SUBCUTANEOUS
  Filled 2021-06-04: qty 1

## 2021-06-04 NOTE — Progress Notes (Addendum)
eLink Physician-Brief Progress Note Patient Name: Darl Ambers Iyengar DOB: 1971-08-30 MRN: [id number]   Date of Service  06/04/2021  HPI/Events of Note  Patient with copious and excessive airway secretions interfering with trach collar trials.  eICU Interventions  Glycopyrrolate 0.2 mg sq x 1 ordered.        Thomasene Lot Brigham Cobbins 06/04/2021, 9:39 PM

## 2021-06-04 NOTE — Progress Notes (Signed)
Pt placed on aerosol trach collar at this time 8L 35% Fio2 without complication. Per CCM MD pt to stay on continuous trach collar as tolerated. RT will continue to monitor and be available as needed.

## 2021-06-04 NOTE — Progress Notes (Signed)
NAME:  Vincent [name], MRN:  [id number], DOB:  1971-06-29, LOS: 36 ADMISSION DATE:  04/29/2021, CONSULTATION DATE:  04/29/21 REFERRING MD:  Dr. Jacqulyn Bath, CHIEF COMPLAINT:  Found Down   History of present illness   50 y/o male smoker admitted with PEA cardiac arrest with ROSC after 6 minutes.  CT head showed old infarct. CT chest showed b/l dense lower lobe consolidation with changes of emphysema. UDS positive for benzodiazepines and THC.  Past Medical History  Emphysema, CVA, DM type 2  Significant Hospital Events   8/2 PEA Arrest, [place] airway in the field, CPR x6 minutes and epi x1, ROSC, Intubated in ED, Admitted to PCCM  8/4 Weaned off TTM, Coox O2 sat of 47%, started on milrinone and lasix 8/5 milrinone increased 8/6 TF stopped, OG placed to suction for ileus 8/8 Post pyloric Cortrak placed, myoclonic jerking, started Keppra, depakote 8/10 Completed antibiotic course for pneumonia. Family meeting transitioned to DNR. 8/15 Tracheostomy  8/18 Febrile overnight, started Zosyn for aspiration pneumonia 8/22 Completed Abx course for aspiration pneumonia  8/24 PEG placed 8/25 ABX started for fevers and ? Free air in abdomen. CT non-acute.  8/31 PSV wean 2 hours 9/1 Stopped lasix, farxiga, losartan, spiro with AKI. Ongoing jerking, diaphoresis. WBC increased, cultured / abx started empirically.  PCT increased. CK 2,450 9/2 start scheduled opiates for possible opiate withdrawal symptoms 9/4 add flexeril, on PSV.  Versed x1 overnight.  Interim history/subjective:  No events. On PS. Ongoing secretions.  Objective   Blood pressure 105/81, pulse 84, temperature 99.5 F (37.5 C), temperature source Rectal, resp. rate 16, height 5\' 8"  (1.727 m), weight 68.4 kg, SpO2 100 %.    Vent Mode: PRVC FiO2 (%):  [40 %] 40 % Set Rate:  [14 bmp-16 bmp] 14 bmp Vt Set:  [550 mL] 550 mL PEEP:  [5 cmH20] 5 cmH20 Pressure Support:  [5 cmH20] 5 cmH20 Plateau Pressure:  [14 cmH20-15 cmH20] 15 cmH20    Intake/Output Summary (Last 24 hours) at 06/04/2021 0900 Last data filed at 06/04/2021 0800 Gross per 24 hour  Intake 2163.19 ml  Output 1650 ml  Net 513.19 ml    Filed Weights   06/02/21 0500 06/03/21 0500 06/04/21 0500  Weight: 69.4 kg 68.3 kg 68.4 kg   Exam: No distress Opens eyes and tracks to voice Not following commands He moves left but not right for me Abd soft, PEG in place Trach in place with thick   Sodium down- not on FWF Cr improved  Resolved Hospital Problem list   Bowel Obstruction/ileus, Acute Kidney Injury from ATN 2nd to Cardiogenic shock, Aspiration Pneumonia, Hyponatremia, Oral candidiasis  Assessment & Plan:   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 -PRN versed for abnormal motor movements  -continue klonopin, flexeril, keppra   Opiate withdrawal. -oxycodone 5mg  Q6 PT  -PRN fentanyl   Acute Systolic/Diastolic CHF after Cardiac Arrest. HLD. -continue lopressor, liptor   Acute Hypoxic Respiratory Failure from Cardiac Arrest and Aspiration Pneumonia. Failure to wean from ventilator s/p tracheostomy. Centrilobular Emphysema. -Indefinite TC today to see how he does -continue yupelri, brovana, pulmicort, PRN albuterol   ESBL Klebsiella in sputum from 05/29/21. -contact isolation  -follow, suspect colonized  -if secretions limiting wean may have to treat  Anemia of critical illness -trend CBC   Dysphagia -TF per nutrition  -PEG care per protocol   LUL Nodular Density 2.6 x 2.1 cm abutting pleura. Seen on CT chest 04/29/21 -  will need follow up imaging in ~3 months depending on neuro status  Disposition. -Limited options due to insurance coverage -TOC assessing potential discharge plan, may have to be placed out of state   Best practice:  Diet/type: tube feeds DVT prophylaxis: SQ heparin GI prophylaxis: protonix Code Status: DNR Disposition: ICU  Critical Care Time: n/a    C[facility] 06/04/2021, 9:00 AM   Please see [email or web address] for pager details.   From 7A-7P if no response, please call [phone] After hours, please call ELink [phone]

## 2021-06-04 NOTE — Progress Notes (Signed)
Nutrition Follow-up  DOCUMENTATION CODES:   Not applicable  INTERVENTION:   Continue tube feeding via PEG: Jevity 1.2 at 75 ml/hr (1800 ml/day) ProSource TF 45 ml BID  Tube feeding regimen provides 2240 kcal, 122 grams of protein, and 1458 ml free water.  NUTRITION DIAGNOSIS:   Inadequate oral intake related to inability to eat as evidenced by NPO status.  Ongoing  GOAL:   Patient will meet greater than or equal to 90% of their needs  Met via TF  MONITOR:   Vent status, Labs, TF tolerance  REASON FOR ASSESSMENT:   Ventilator, Consult Enteral/tube feeding initiation and management  ASSESSMENT:   50 yo male admitted S/P PEA cardiac arrest at home. PMH includes tobacco abuse, emphysema, CVA, diabetes.  8/15 - s/p trach 8/24 - s/p PEG  Current TF via PEG: Jevity 1.2 at 75 ml/hr, Prosource TF 45 ml BID. Tolerating TF without difficulty.  Discussed patient in ICU rounds and with RN today. On trach collar this morning.  Awaiting placement.  Medications reviewed and include Colace, Keppra, Protonix, Senokot.  Labs reviewed. Na 132 (L) CBG: 110 this morning  I/O's: +35 L since admit  Intake/Output Summary (Last 24 hours) at 06/04/2021 1244 Last data filed at 06/04/2021 1200 Gross per 24 hour  Intake 2183.2 ml  Output 1650 ml  Net 533.2 ml    Admit weight: 70 kg Current weight: 68.4 kg   Diet Order:   Diet Order     None       EDUCATION NEEDS:   Not appropriate for education at this time  Skin:  Skin Assessment: Reviewed RN Assessment  Last BM:  9/7 type 7  Height:   Ht Readings from Last 1 Encounters:  05/26/21 5' 8"  (1.727 m)    Weight:   Wt Readings from Last 1 Encounters:  06/04/21 68.4 kg    Ideal Body Weight:  70 kg  BMI:  Body mass index is 22.93 kg/m.  Estimated Nutritional Needs:   Kcal:  2000-2200  Protein:  110-125 gm  Fluid:  >/= 2 L   Lucas Mallow, RD, LDN, CNSC Please refer to Amion for contact information.

## 2021-06-05 DIAGNOSIS — J9601 Acute respiratory failure with hypoxia: Secondary | ICD-10-CM | POA: Diagnosis not present

## 2021-06-05 LAB — GLUCOSE, CAPILLARY: Glucose-Capillary: 117 mg/dL — ABNORMAL HIGH (ref 70–99)

## 2021-06-05 MED ORDER — AZITHROMYCIN 500 MG PO TABS
500.0000 mg | ORAL_TABLET | Freq: Once | ORAL | Status: AC
  Administered 2021-06-05: 500 mg via ORAL
  Filled 2021-06-05: qty 1

## 2021-06-05 MED ORDER — PREDNISONE 20 MG PO TABS
40.0000 mg | ORAL_TABLET | Freq: Every day | ORAL | Status: DC
  Administered 2021-06-05: 40 mg via ORAL
  Filled 2021-06-05: qty 2

## 2021-06-05 MED ORDER — AZITHROMYCIN 250 MG PO TABS: 250.0000 mg | ORAL_TABLET | Freq: Every day | ORAL | Status: DC

## 2021-06-05 MED ORDER — AZITHROMYCIN 250 MG PO TABS
250.0000 mg | ORAL_TABLET | Freq: Every day | ORAL | Status: AC
  Administered 2021-06-06 – 2021-06-09 (×4): 250 mg
  Filled 2021-06-05 (×4): qty 1

## 2021-06-05 MED ORDER — CYCLOBENZAPRINE HCL 10 MG PO TABS
5.0000 mg | ORAL_TABLET | Freq: Three times a day (TID) | ORAL | Status: DC
  Administered 2021-06-05 – 2021-06-12 (×20): 5 mg
  Filled 2021-06-05 (×20): qty 1

## 2021-06-05 MED ORDER — PREDNISONE 20 MG PO TABS
40.0000 mg | ORAL_TABLET | Freq: Every day | ORAL | Status: AC
  Administered 2021-06-06 – 2021-06-09 (×4): 40 mg
  Filled 2021-06-05 (×4): qty 2

## 2021-06-05 NOTE — TOC Progression Note (Signed)
Transition of Care [facility]) - Initial/Assessment Note    Patient Details  Name: Vincent [name] MRN: [id number] Date of Birth: [birth year]  [facility]) CM/SW Contact:    Ralene Bathe, LCSWA Phone Number: 06/05/2021, 4:05 PM  Clinical Narrative:                 CSW called [facility] and inquired if they could accept the patient with a trach.  CSW was informed that the facility could not.  CSW updated FL2 and faxed it out throughout the state of [place] in an attempt to fined a SNF that has the capacity to care for a patient with a trach and that will accept the patient's insurance.   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)    Activities of Daily Living      Permission Sought/Granted      Share Information with NAME: Raynelle Fanning (Sister)  [phone]           Emotional Assessment       Orientation: :  (pt on ventilator) Alcohol / Substance Use: Tobacco Use Psych Involvement: No (comment)  Admission diagnosis:  Cardiac arrest [facility]) [I46.9] Patient Active Problem List   Diagnosis Date Noted   Goals of care, counseling/discussion    Tracheostomy in place [facility])    Anoxic encephalopathy (HCC)    Seizure-like activity (HCC)    Acute respiratory failure with hypoxia (HCC)    Cardiac arrest (HCC) 04/29/2021   PCP:  Pcp, No Pharmacy:   [id number] [place], [place] - [phone] AT [facility] [address] [place] [address] Phone: [phone] Fax: [phone]     Social Determinants of Health (SDOH) Interventions    Readmission Risk Interventions No flowsheet data found.

## 2021-06-05 NOTE — NC FL2 (Signed)
[place] LEVEL OF CARE SCREENING TOOL     IDENTIFICATION  Patient Name: Vincent [name] Birthdate: 06-Aug-1971 Sex: male Admission Date (Current Location): 04/29/2021  [place] and [place] Number:  Kathleen Argue [id number] Facility and Address:  [facility] [place], [place] [address] [place], [place] [address]      Provider Number: [id number]  Attending Physician Name and Address:  Freddi Starr, MD  Relative Name and Phone Number:  Carmie Kanner (Sister)   [phone]    Current Level of Care: Hospital Recommended Level of Care: [place] Prior Approval Number:    Date Approved/Denied:   PASRR Number: [id number]  Discharge Plan: SNF    Current Diagnoses: Patient Active Problem List   Diagnosis Date Noted   Goals of care, counseling/discussion    Tracheostomy in place [facility])    Anoxic encephalopathy ([place])    Seizure-like activity ([place])    Acute respiratory failure with hypoxia ([place])    Cardiac arrest ([place]) 04/29/2021    Orientation RESPIRATION BLADDER Height & Weight      (UTA)  Tracheostomy Incontinent, External catheter Weight: 150 lb 12.7 oz (68.4 kg) Height:  5' 8"  (172.7 cm)  BEHAVIORAL SYMPTOMS/MOOD NEUROLOGICAL BOWEL NUTRITION STATUS      Incontinent Feeding tube  AMBULATORY STATUS COMMUNICATION OF NEEDS Skin   Extensive Assist Does not communicate Surgical wounds                       Personal Care Assistance Level of Assistance  Total care       Total Care Assistance: Maximum assistance   Functional Limitations Info  Speech, Hearing, Sight Sight Info: Adequate Hearing Info: Adequate Speech Info: Impaired (UTA)    SPECIAL CARE FACTORS FREQUENCY  PT (By licensed PT), Restorative feeding program     PT Frequency: 3x/ week OT Frequency: 3x/ week            Contractures Contractures Info: Not present    Additional Factors Info  Code Status, Allergies Code Status Info: DNR Allergies  Info: NKA           Current Medications (06/05/2021):  This is the current hospital active medication list Current Facility-Administered Medications  Medication Dose Route Frequency Provider Last Rate Last Admin   0.9 %  sodium chloride infusion  250 mL Intravenous Continuous Desai, Rahul P, PA-C   Stopped at 06/03/21 1725   acetaminophen (TYLENOL) 160 MG/5ML solution 650 mg  650 mg Per Tube Q6H PRN Deland Pretty, MD   650 mg at 05/29/21 1538   albuterol (PROVENTIL) (2.5 MG/3ML) 0.083% nebulizer solution 2.5 mg  2.5 mg Nebulization Q4H PRN Chesley Mires, MD   2.5 mg at 06/04/21 0000   arformoterol (BROVANA) nebulizer solution 15 mcg  15 mcg Nebulization BID Maudie Mercury, MD   15 mcg at 06/05/21 0800   atorvastatin (LIPITOR) tablet 40 mg  40 mg Per Tube QHS Chesley Mires, MD   40 mg at 06/04/21 2102   [START ON 06/06/2021] azithromycin (ZITHROMAX) tablet 250 mg  250 mg Oral Daily Freddi Starr, MD       budesonide (PULMICORT) nebulizer solution 0.5 mg  0.5 mg Nebulization BID Freda Jackson B, MD   0.5 mg at 06/05/21 0800   chlorhexidine gluconate (MEDLINE KIT) (PERIDEX) 0.12 % solution 15 mL  15 mL Mouth Rinse BID Chesley Mires, MD   15 mL at 06/05/21 0816   Chlorhexidine Gluconate Cloth 2 % PADS 6  each  6 each Topical Q0600 Spero Geralds, MD   6 each at 06/05/21 1030   clonazePAM (KLONOPIN) tablet 0.5 mg  0.5 mg Per Tube Q6H Freda Jackson B, MD   0.5 mg at 06/05/21 1123   cyclobenzaprine (FLEXERIL) tablet 5 mg  5 mg Oral Q8H Sood, Vineet, MD   5 mg at 06/05/21 1449   docusate (COLACE) 50 MG/5ML liquid 200 mg  200 mg Per Tube BID Chesley Mires, MD   200 mg at 06/05/21 1122   feeding supplement (JEVITY 1.2 CAL) liquid 1,000 mL  1,000 mL Per Tube Continuous Olalere, Adewale A, MD 75 mL/hr at 06/04/21 1712 1,000 mL at 06/04/21 1712   feeding supplement (PROSource TF) liquid 45 mL  45 mL Per Tube BID Olalere, Adewale A, MD   45 mL at 06/05/21 1124   heparin injection 5,000 Units   5,000 Units Subcutaneous Q8H Desai, Rahul P, PA-C   5,000 Units at 06/05/21 1449   levETIRAcetam (KEPPRA) 100 MG/ML solution 500 mg  500 mg Per Tube BID Ollis, Brandi L, NP   500 mg at 06/05/21 1122   MEDLINE mouth rinse  15 mL Mouth Rinse 10 times per day Chesley Mires, MD   15 mL at 06/05/21 1449   metoprolol tartrate (LOPRESSOR) 25 mg/10 mL oral suspension 50 mg  50 mg Per Tube Q8H Chand, Currie Paris, MD   50 mg at 06/05/21 1449   midazolam (VERSED) injection 2 mg  2 mg Intravenous Q2H PRN Chesley Mires, MD   2 mg at 06/04/21 0221   oxyCODONE (Oxy IR/ROXICODONE) immediate release tablet 5 mg  5 mg Per Tube Q6H Freda Jackson B, MD   5 mg at 06/05/21 1122   pantoprazole sodium (PROTONIX) 40 mg/20 mL oral suspension 40 mg  40 mg Per Tube QHS Ursula Beath, RPH   40 mg at 06/04/21 2102   polyethylene glycol (MIRALAX / GLYCOLAX) packet 17 g  17 g Per Tube Daily PRN Maudie Mercury, MD   17 g at 05/27/21 1202   polyethylene glycol (MIRALAX / GLYCOLAX) packet 17 g  17 g Per Tube Daily Chesley Mires, MD   17 g at 06/05/21 1127   predniSONE (DELTASONE) tablet 40 mg  40 mg Oral Q breakfast Freddi Starr, MD   40 mg at 06/05/21 1127   revefenacin (YUPELRI) nebulizer solution 175 mcg  175 mcg Nebulization Daily Maudie Mercury, MD   175 mcg at 06/05/21 0800   senna (SENOKOT) tablet 8.6 mg  1 tablet Per Tube Daily Chesley Mires, MD   8.6 mg at 06/05/21 1123     Discharge Medications: Please see discharge summary for a list of discharge medications.  Relevant Imaging Results:  Relevant Lab Results:   Additional Information SSN:  [place], LCSWA

## 2021-06-05 NOTE — Progress Notes (Addendum)
NAME:  Vincent [name], MRN:  [id number], DOB:  1970/10/09, LOS: 37 ADMISSION DATE:  04/29/2021, CONSULTATION DATE:  04/29/21 REFERRING MD:  Dr. Jacqulyn Bath, CHIEF COMPLAINT:  Found Down   History of present illness   50 y/o male smoker admitted with PEA cardiac arrest with ROSC after 6 minutes.  CT head showed old infarct. CT chest showed b/l dense lower lobe consolidation with changes of emphysema. UDS positive for benzodiazepines and THC.  Past Medical History  Emphysema, CVA, DM type 2  Significant Hospital Events   8/2 PEA Arrest, [place] airway in the field, CPR x6 minutes and epi x1, ROSC, Intubated in ED, Admitted to PCCM  8/4 Weaned off TTM, [place] O2 sat of 47%, started on milrinone and lasix 8/5 milrinone increased 8/6 TF stopped, OG placed to suction for ileus 8/8 Post pyloric Cortrak placed, myoclonic jerking, started Keppra, depakote 8/10 Completed antibiotic course for pneumonia. Family meeting transitioned to DNR. 8/15 Tracheostomy  8/18 Febrile overnight, started Zosyn for aspiration pneumonia 8/22 Completed Abx course for aspiration pneumonia  8/24 PEG placed 8/25 ABX started for fevers and ? Free air in abdomen. CT non-acute.  8/31 PSV wean 2 hours 9/1 Stopped lasix, farxiga, losartan, spiro with AKI. Ongoing jerking, diaphoresis. WBC increased, cultured / abx started empirically.  PCT increased. CK 2,450 9/2 start scheduled opiates for possible opiate withdrawal symptoms 9/4 add flexeril, on PSV.  Versed x1 overnight.  Interim history/subjective:  No events Has been on ATC for 24 hrs  Tmax 99.9 normotensive  Objective   Blood pressure 100/64, pulse (!) 106, temperature 99.5 F (37.5 C), temperature source Core, resp. rate 16, height 5\' 8"  (1.727 m), weight 68.4 kg, SpO2 97 %.    FiO2 (%):  [28 %-35 %] 28 %   Intake/Output Summary (Last 24 hours) at 06/05/2021 08/05/2021 Last data filed at 06/05/2021 0700 Gross per 24 hour  Intake 1880 ml  Output 1650 ml  Net 230 ml    Filed Weights   06/02/21 0500 06/03/21 0500 06/04/21 0500  Weight: 69.4 kg 68.3 kg 68.4 kg   Exam: General:  Adult male lying in bed in NAD HEENT: MM pink/moist, pupils 4/reactive, midline trach site wnl Neuro: open eyes to command, did not track or follow commands, no spont movement seen CV: rr, no murmur PULM:  non labored, diminished, diffuse exp wheeze GI: soft, bs active, ND, peg tube and primafit in place Extremities: warm/dry, no LE edema, cachectic, prevalon boots in place Skin: no rashes  9/8- lab holiday today  Resolved Hospital Problem list   Bowel Obstruction/ileus, Acute Kidney Injury from ATN 2nd to Cardiogenic shock, Aspiration Pneumonia, Hyponatremia, Oral candidiasis  Assessment & Plan:   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 - continue klonopin 0.5 mg q 6hrs, flexeril 5mg  q 8hrs, and keppra 500mg  BID - prn versed for abnormal motor movements  Opiate withdrawal. -continue Oxy IR 5mg  q 6hrs with scheduled bowel regimen and prn's  Acute Systolic/Diastolic CHF after Cardiac Arrest. HLD. -continue lopressor 50mg  q 8hr, liptor   Acute Hypoxic Respiratory Failure from Cardiac Arrest and Aspiration Pneumonia. Failure to wean from ventilator s/p tracheostomy. Centrilobular Emphysema. - doing well thus far on ATC currently at 24hrs - continue yupelri, brovana, pulmicort, PRN albuterol  - ongoing trach care - given ongoing wheezing, will treat for AECOPD with prednisone 40mg  x 5 days and azithromycin x 5 days    ESBL Klebsiella in sputum from  05/29/21. - continue contact isolation  - suspect colonized  - if worsening secretions/ clinical deterioration, may need to treat  Anemia of critical illness -trend CBC   Dysphagia - TF per nutrition  - PEG care per protocol   LUL Nodular Density 2.6 x 2.1 cm abutting pleura. Seen on CT chest 04/29/21 - will need follow up imaging in ~3 months depending  on neuro status  Disposition. - Limited options due to insurance coverage -TOC still assessing potential discharge plan, may have to be placed out of state.  If he can continue on ATC, hopefully will liberate options for placement.   Best practice:  Diet/type: tube feeds DVT prophylaxis: SQ heparin GI prophylaxis: protonix Code Status: DNR Disposition: ICU  No family at bedside 9/8  Critical Care Time: n/a     Posey Boyer, ACNP [facility] 06/05/2021, 9:23 AM  See Amion for pager If no response to pager, please call PCCM consult pager After 7:00 pm call Elink

## 2021-06-06 DIAGNOSIS — J9601 Acute respiratory failure with hypoxia: Secondary | ICD-10-CM | POA: Diagnosis not present

## 2021-06-06 LAB — GLUCOSE, CAPILLARY
Glucose-Capillary: 103 mg/dL — ABNORMAL HIGH (ref 70–99)
Glucose-Capillary: 168 mg/dL — ABNORMAL HIGH (ref 70–99)

## 2021-06-06 MED ORDER — GLYCOPYRROLATE 0.2 MG/ML IJ SOLN
0.1000 mg | INTRAMUSCULAR | Status: AC | PRN
Start: 2021-06-06 — End: 2021-07-03
  Administered 2021-06-06 – 2021-07-03 (×3): 0.1 mg via SUBCUTANEOUS
  Filled 2021-06-06 (×3): qty 1

## 2021-06-06 NOTE — Progress Notes (Signed)
NAME:  Vincent [name], MRN:  [id number], DOB:  1971/03/28, LOS: 38 ADMISSION DATE:  04/29/2021, CONSULTATION DATE:  04/29/21 REFERRING MD:  Dr. Jacqulyn Bath, CHIEF COMPLAINT:  Found Down   History of present illness   50 y/o male smoker admitted with PEA cardiac arrest with ROSC after 6 minutes.  CT head showed old infarct. CT chest showed b/l dense lower lobe consolidation with changes of emphysema. UDS positive for benzodiazepines and THC.  Past Medical History  Emphysema, CVA, DM type 2  Significant Hospital Events   8/2 PEA Arrest, [place] airway in the field, CPR x6 minutes and epi x1, ROSC, Intubated in ED, Admitted to PCCM  8/4 Weaned off TTM, Coox O2 sat of 47%, started on milrinone and lasix 8/5 milrinone increased 8/6 TF stopped, OG placed to suction for ileus 8/8 Post pyloric Cortrak placed, myoclonic jerking, started Keppra, depakote 8/10 Completed antibiotic course for pneumonia. Family meeting transitioned to DNR. 8/15 Tracheostomy  8/18 Febrile overnight, started Zosyn for aspiration pneumonia 8/22 Completed Abx course for aspiration pneumonia  8/24 PEG placed 8/25 ABX started for fevers and ? Free air in abdomen. CT non-acute.  8/31 PSV wean 2 hours 9/1 Stopped lasix, farxiga, losartan, spiro with AKI. Ongoing jerking, diaphoresis. WBC increased, cultured / abx started empirically.  PCT increased. CK 2,450 9/2 start scheduled opiates for possible opiate withdrawal symptoms 9/4 add flexeril, on PSV.  Versed x1 overnight. 9/9 has tolerated trach collar for 48+hours  Interim history/subjective:   No acute events overnight. Has remained on trach collar for over 48 hours now.   Objective   Blood pressure 116/84, pulse 97, temperature 98.9 F (37.2 C), temperature source Core, resp. rate 18, height 5\' 8"  (1.727 m), weight 68.4 kg, SpO2 100 %.    FiO2 (%):  [28 %] 28 %   Intake/Output Summary (Last 24 hours) at 06/06/2021 1046 Last data filed at 06/06/2021 0800 Gross per 24  hour  Intake 1425 ml  Output 1600 ml  Net -175 ml   Filed Weights   06/02/21 0500 06/03/21 0500 06/04/21 0500  Weight: 69.4 kg 68.3 kg 68.4 kg   Exam: General:  Adult male lying in bed in NAD HEENT: MM pink/moist, pupils 4/reactive, midline trach site wnl Neuro: alert, does not follow commands CV: rrr, no murmur PULM:  diminished but clear to auscultation, no wheezing GI: soft, non-tender, non-distended, peg tube in place Extremities: warm/dry, no LE edema, cachectic, prevalon boots in place Skin: no rashes  Resolved Hospital Problem list   Bowel Obstruction/ileus, Acute Kidney Injury from ATN 2nd to Cardiogenic shock, Aspiration Pneumonia, Hyponatremia, Oral candidiasis  Assessment & Plan:   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 - continue klonopin 0.5 mg q 6hrs, flexeril 5mg  q 8hrs, and keppra 500mg  BID - prn versed for abnormal motor movements  Opiate withdrawal. -continue Oxy IR 5mg  q 6hrs with scheduled bowel regimen and prn's  Acute Systolic/Diastolic CHF after Cardiac Arrest. HLD. -continue lopressor 50mg  q 8hr, liptor   Acute Hypoxic Respiratory Failure from Cardiac Arrest and Aspiration Pneumonia. Failure to wean from ventilator s/p tracheostomy. Centrilobular Emphysema. - tolerating trach collar for over 48 hours - continue yupelri, brovana, pulmicort, PRN albuterol  - ongoing trach care - given ongoing wheezing, 9/8 started treatment for AECOPD with prednisone 40mg  x 5 days and azithromycin x 5 days  ESBL Klebsiella in sputum from 05/29/21. - continue contact isolation  - suspect colonized  -  if worsening secretions/ clinical deterioration, may need to treat  Anemia of critical illness -trend CBC   Dysphagia - TF per nutrition  - PEG care per protocol   LUL Nodular Density 2.6 x 2.1 cm abutting pleura. Seen on CT chest 04/29/21 - will need follow up imaging in ~3 months depending on neuro  status  Disposition. - Limited options due to insurance coverage -TOC still assessing potential discharge plan, may have to be placed out of state.  If he can continue on ATC, hopefully will liberate options for placement.   Best practice:  Diet/type: tube feeds DVT prophylaxis: SQ heparin GI prophylaxis: protonix Code Status: DNR Disposition: transfer to progressive care unit  No family at bedside 9/8  Critical Care Time: n/a    Melody Comas, MD [facility] Office: [phone]   See Amion for personal pager PCCM on call pager [phone] until 7pm. Please call Elink 7p-7a. [phone]

## 2021-06-06 NOTE — Progress Notes (Signed)
eLink Physician-Brief Progress Note Patient Name: Vincent [name] DOB: [birth year] MRN: [id number]   Date of Service  06/06/2021  HPI/Events of Note  Copious / excessive tracheostomy secretions interfering with good pulmonary hygiene.  eICU Interventions  PRN SQ Robinul ordered.        Vincent [name] 06/06/2021, 12:45 AM

## 2021-06-07 ENCOUNTER — Inpatient Hospital Stay (HOSPITAL_COMMUNITY): Payer: Medicaid Other

## 2021-06-07 DIAGNOSIS — J9601 Acute respiratory failure with hypoxia: Secondary | ICD-10-CM | POA: Diagnosis not present

## 2021-06-07 DIAGNOSIS — Z930 Tracheostomy status: Secondary | ICD-10-CM | POA: Diagnosis not present

## 2021-06-07 DIAGNOSIS — I469 Cardiac arrest, cause unspecified: Secondary | ICD-10-CM | POA: Diagnosis not present

## 2021-06-07 LAB — BASIC METABOLIC PANEL
Anion gap: 9 (ref 5–15)
BUN: 33 mg/dL — ABNORMAL HIGH (ref 6–20)
CO2: 33 mmol/L — ABNORMAL HIGH (ref 22–32)
Calcium: 11 mg/dL — ABNORMAL HIGH (ref 8.9–10.3)
Chloride: 94 mmol/L — ABNORMAL LOW (ref 98–111)
Creatinine, Ser: 0.82 mg/dL (ref 0.61–1.24)
GFR, Estimated: >60 mL/min (ref >60)
Glucose, Bld: 151 mg/dL — ABNORMAL HIGH (ref 70–99)
Potassium: 5 mmol/L (ref 3.5–5.1)
Sodium: 136 mmol/L (ref 135–145)

## 2021-06-07 LAB — GLUCOSE, CAPILLARY: Glucose-Capillary: 179 mg/dL — ABNORMAL HIGH (ref 70–99)

## 2021-06-07 MED ORDER — FUROSEMIDE 10 MG/ML IJ SOLN
40.0000 mg | Freq: Once | INTRAMUSCULAR | Status: AC
  Administered 2021-06-07: 40 mg via INTRAVENOUS
  Filled 2021-06-07: qty 4

## 2021-06-07 NOTE — Progress Notes (Addendum)
PROGRESS NOTE    Vincent [name]   BEE:[id number]  DOB: 10/14/1970  PCP: Pcp, No    DOA: 04/29/2021 LOS: 62    Brief Narrative / Hospital Course to Date:   50 yo male smoker who was admitted to ICU after PEA cardiac arrest with ROSC after 6 minutes of resuscitation.  CT head showed an old infarct, nothing acute.  CT chest with bilateral dense lower lobe consolidations with emphysematous changes.   Complicated hospital course as follows, per PCCM.  Significant events 8/2 PEA Arrest, [place] airway in the field, CPR x6 minutes and epi x1, ROSC, Intubated in ED, Admitted to PCCM  8/4 Weaned off TTM, [place] O2 sat of 47%, started on milrinone and lasix 8/5 milrinone increased 8/6 TF stopped, OG placed to suction for ileus 8/8 Post pyloric Cortrak placed, myoclonic jerking, started Keppra, depakote 8/10 Completed antibiotic course for pneumonia. Family meeting transitioned to DNR. 8/15 Tracheostomy  8/18 Febrile overnight, started Zosyn for aspiration pneumonia 8/22 Completed Abx course for aspiration pneumonia  8/24 PEG placed 8/25 ABX started for fevers and ? Free air in abdomen. CT non-acute.  8/31 PSV wean 2 hours 9/1 Stopped lasix, farxiga, losartan, spiro with AKI. Ongoing jerking, diaphoresis. WBC increased, cultured / abx started empirically.  PCT increased. CK 2,450 9/2 start scheduled opiates for possible opiate withdrawal symptoms 9/4 add flexeril, on PSV.  Versed x1 overnight. 9/9 has tolerated trach collar for 48+hours    TRH assumed care 06/07/21.   Patient has persistent encephalopathy but otherwise stable.  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  Follow BMP.  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 Per PCCM suspect this to be colonization. --Treat for infection if worsening clinically.  Anemia of critical illness - no evidence of bleeding. --follow CBC  Dyphagia - s/p PEG on tube feeds per dietitian PEG care.  LUL lung nodule - 2.6 x 2.1 cm abutting pleura on CT 04/29/21. --Follow up imaging in ~3 months  Hyperlipidemia - on Lipitor  Patient BMI: Body mass index is 22.93 kg/m.   DVT prophylaxis: heparin injection 5,000 Units Start: 04/29/21 2230 SCDs Start: 04/29/21 2227   Diet:     Code Status: DNR   Subjective 06/07/21    Pt appears working a bit to breath when seen this AM.  Having some secretions from trach.  No acute events reported.  Nonverbal and non-responsive.   Disposition Plan & Communication  Status is: Inpatient  Remains inpatient appropriate because:Inpatient level of care appropriate due to severity of illness.  Requires placement, limited options due to insurance and trach/peg status.    Dispo: The patient is from: Home              Anticipated d/c is  to: SNF vs LTAC?              Patient currently is not medically stable to d/c.   Difficult to place patient Yes    Consults, Procedures, Significant Events   Consultants:  PCCM  Procedures:  As above "significant events"  Antimicrobials:  Anti-infectives (From admission, onward)    Start     Dose/Rate Route Frequency Ordered Stop   06/06/21 1000  azithromycin (ZITHROMAX) tablet 250 mg  Status:  Discontinued       See Hyperspace for full Linked Orders Report.   250 mg Oral Daily 06/05/21 1014 06/05/21 1629   06/06/21 1000  azithromycin (ZITHROMAX) tablet 250 mg       See Hyperspace for full Linked Orders Report.   250 mg Per Tube Daily 06/05/21 1629 06/10/21 0959   06/05/21 1100  azithromycin (ZITHROMAX) tablet 500 mg       See Hyperspace for full Linked Orders Report.   500 mg Oral  Once 06/05/21 1014 06/05/21 1124   05/30/21 1530  vancomycin (VANCOREADY) IVPB 1250 mg/250 mL  Status:  Discontinued        1,250 mg 166.7 mL/hr over 90 Minutes Intravenous Every 24 hours 05/29/21 1504 05/30/21 1028   05/30/21 0100  ceFEPIme (MAXIPIME) 2 g in sodium chloride 0.9 % 100 mL IVPB  Status:  Discontinued        2 g 200 mL/hr over 30 Minutes Intravenous Every 12 hours 05/29/21 1504 05/30/21 1028   05/29/21 1430  ceFEPIme (MAXIPIME) 2 g in sodium chloride 0.9 % 100 mL IVPB        2 g 200 mL/hr over 30 Minutes Intravenous  Once 05/29/21 1331 05/29/21 1614   05/29/21 1430  vancomycin (VANCOREADY) IVPB 1500 mg/300 mL        1,500 mg 150 mL/hr over 120 Minutes Intravenous  Once 05/29/21 1331 05/29/21 1814   05/23/21 1000  fluconazole (DIFLUCAN) tablet 100 mg       See Hyperspace for full Linked Orders Report.   100 mg Per Tube Daily 05/22/21 1043 05/31/21 0940   05/22/21 2000  piperacillin-tazobactam (ZOSYN) IVPB 3.375 g  Status:  Discontinued        3.375 g 12.5 mL/hr over 240 Minutes Intravenous Every 8 hours 05/22/21 1227 05/23/21 1017   05/22/21 1315  piperacillin-tazobactam (ZOSYN)  IVPB 3.375 g        3.375 g 100 mL/hr over 30 Minutes Intravenous  Once 05/22/21 1227 05/22/21 1404   05/22/21 1130  fluconazole (DIFLUCAN) tablet 100 mg  Status:  Discontinued        100 mg Per Tube Daily 05/22/21 1035 05/22/21 1042   05/22/21 1130  fluconazole (DIFLUCAN) tablet 200 mg       See Hyperspace for full Linked Orders Report.   200 mg Per Tube  Once 05/22/21 1043 05/22/21 1054   05/21/21 1112  ceFAZolin (ANCEF) IVPB 1 g/50 mL premix        over 30 Minutes  Continuous PRN 05/21/21 1120 05/21/21 1148   05/21/21 0600  ceFAZolin (ANCEF) IVPB 2g/100 mL premix        2 g 200 mL/hr over 30  Minutes Intravenous To Radiology 05/20/21 1318 05/21/21 1142   05/16/21 1800  Ampicillin-Sulbactam (UNASYN) 3 g in sodium chloride 0.9 % 100 mL IVPB  Status:  Discontinued        3 g 200 mL/hr over 30 Minutes Intravenous Every 6 hours 05/16/21 1107 05/19/21 1014   05/15/21 1100  piperacillin-tazobactam (ZOSYN) IVPB 3.375 g  Status:  Discontinued        3.375 g 12.5 mL/hr over 240 Minutes Intravenous Every 8 hours 05/15/21 1000 05/16/21 1107   05/05/21 1400  amoxicillin-clavulanate (AUGMENTIN) 875-125 MG per tablet 1 tablet        1 tablet Per Tube Every 12 hours 05/05/21 1358 05/06/21 1453   04/30/21 2000  ceFEPIme (MAXIPIME) 2 g in sodium chloride 0.9 % 100 mL IVPB  Status:  Discontinued        2 g 200 mL/hr over 30 Minutes Intravenous Every 12 hours 04/30/21 0814 04/30/21 0922   04/30/21 2000  cefTRIAXone (ROCEPHIN) 2 g in sodium chloride 0.9 % 100 mL IVPB  Status:  Discontinued        2 g 200 mL/hr over 30 Minutes Intravenous Every 24 hours 04/30/21 0922 05/05/21 1358   04/30/21 1015  azithromycin (ZITHROMAX) 500 mg in sodium chloride 0.9 % 250 mL IVPB  Status:  Discontinued        500 mg 250 mL/hr over 60 Minutes Intravenous Every 24 hours 04/30/21 0922 05/01/21 0925   04/30/21 0800  ceFEPIme (MAXIPIME) 2 g in sodium chloride 0.9 % 100 mL IVPB  Status:  Discontinued        2 g 200 mL/hr  over 30 Minutes Intravenous Every 8 hours 04/29/21 2239 04/30/21 0814   04/29/21 2359  vancomycin (VANCOREADY) IVPB 1500 mg/300 mL  Status:  Discontinued        1,500 mg 150 mL/hr over 120 Minutes Intravenous Daily at bedtime 04/29/21 2241 04/30/21 0922   04/29/21 2200  ceFEPIme (MAXIPIME) 2 g in sodium chloride 0.9 % 100 mL IVPB        2 g 200 mL/hr over 30 Minutes Intravenous  Once 04/29/21 2151 04/30/21 0048   04/29/21 2200  azithromycin (ZITHROMAX) 500 mg in sodium chloride 0.9 % 250 mL IVPB        500 mg 250 mL/hr over 60 Minutes Intravenous  Once 04/29/21 2151 04/30/21 0256         Micro    Objective   Vitals:   06/07/21 0938 06/07/21 1100 06/07/21 1434 06/07/21 1625  BP: 118/75  (!) 133/99   Pulse:  (!) 102 (!) 102 90  Resp:  (!) 22  17  Temp: (!) 97.4 F (36.3 C)     TempSrc: Axillary     SpO2:  100%  100%  Weight:      Height:        Intake/Output Summary (Last 24 hours) at 06/07/2021 1713 Last data filed at 06/07/2021 1400 Gross per 24 hour  Intake 1162 ml  Output 1200 ml  Net -38 ml   Filed Weights   06/02/21 0500 06/03/21 0500 06/04/21 0500  Weight: 69.4 kg 68.3 kg 68.4 kg    Physical Exam:  General exam: eyes open, unresponsive, no acute distress HEENT: on trach collar, moist mucus membranes, hearing grossly normal  Respiratory system: diffusely coarse breath sounds, no wheeze,s increased respiratory effort with abdominal breathing. Cardiovascular system: normal S1/S2, tachycardic, regular rhythm, no pedal edema.   Gastrointestinal system: mildly distended, non-tender, PEG in place. Central nervous  system: pt unresponsive but awake, unable to follow commands to assess, no myoclonic jerking seen Extremities: offloading boots on b/l feet   Labs   Data Reviewed: I have personally reviewed following labs and imaging studies  CBC: Recent Labs  Lab 06/04/21 0123  WBC 11.2*  HGB 12.4*  HCT 37.3*  MCV 87.4  PLT 096   Basic Metabolic  Panel: Recent Labs  Lab 06/04/21 0123 06/07/21 0304  NA 132* 136  K 4.8 5.0  CL 94* 94*  CO2 27 33*  GLUCOSE 143* 151*  BUN 24* 33*  CREATININE 0.75 0.82  CALCIUM 9.8 11.0*   GFR: Estimated Creatinine Clearance: 103.1 mL/min (by C-G formula based on SCr of 0.82 mg/dL). Liver Function Tests: No results for input(s): AST, ALT, ALKPHOS, BILITOT, PROT, ALBUMIN in the last 168 hours. No results for input(s): LIPASE, AMYLASE in the last 168 hours. No results for input(s): AMMONIA in the last 168 hours. Coagulation Profile: No results for input(s): INR, PROTIME in the last 168 hours. Cardiac Enzymes: No results for input(s): CKTOTAL, CKMB, CKMBINDEX, TROPONINI in the last 168 hours. BNP (last 3 results) No results for input(s): PROBNP in the last 8760 hours. HbA1C: No results for input(s): HGBA1C in the last 72 hours. CBG: Recent Labs  Lab 06/04/21 0734 06/04/21 2014 06/05/21 0757 06/06/21 0321 06/06/21 0736  GLUCAP 110* 109* 117* 103* 168*   Lipid Profile: No results for input(s): CHOL, HDL, LDLCALC, TRIG, CHOLHDL, LDLDIRECT in the last 72 hours. Thyroid Function Tests: No results for input(s): TSH, T4TOTAL, FREET4, T3FREE, THYROIDAB in the last 72 hours. Anemia Panel: No results for input(s): VITAMINB12, FOLATE, FERRITIN, TIBC, IRON, RETICCTPCT in the last 72 hours. Sepsis Labs: No results for input(s): PROCALCITON, LATICACIDVEN in the last 168 hours.  Recent Results (from the past 240 hour(s))  Culture, Respiratory w Gram Stain     Status: None   Collection Time: 05/29/21 12:36 PM   Specimen: Tracheal Aspirate; Respiratory  Result Value Ref Range Status   Specimen Description TRACHEAL ASPIRATE  Final   Special Requests NONE  Final   Gram Stain   Final    NO ORGANISMS SEEN SQUAMOUS EPITHELIAL CELLS PRESENT ABUNDANT WBC PRESENT,BOTH PMN AND MONONUCLEAR ABUNDANT GRAM POSITIVE COCCI MODERATE GRAM NEGATIVE RODS Performed at [facility], [place] [address],  [place], [place] [address]    Culture   Final    FEW KLEBSIELLA PNEUMONIAE FEW STAPHYLOCOCCUS AUREUS Confirmed Extended Spectrum Beta-Lactamase Producer (ESBL).  In bloodstream infections from ESBL organisms, carbapenems are preferred over piperacillin/tazobactam. They are shown to have a lower risk of mortality.    Report Status 06/01/2021 FINAL  Final   Organism ID, Bacteria KLEBSIELLA PNEUMONIAE  Final   Organism ID, Bacteria STAPHYLOCOCCUS AUREUS  Final      Susceptibility   Klebsiella pneumoniae - MIC*    AMPICILLIN >=32 RESISTANT Resistant     CEFAZOLIN >=64 RESISTANT Resistant     CEFEPIME >=32 RESISTANT Resistant     CEFTAZIDIME RESISTANT Resistant     CEFTRIAXONE >=64 RESISTANT Resistant     CIPROFLOXACIN >=4 RESISTANT Resistant     GENTAMICIN >=16 RESISTANT Resistant     IMIPENEM <=0.25 SENSITIVE Sensitive     TRIMETH/SULFA >=320 RESISTANT Resistant     AMPICILLIN/SULBACTAM >=32 RESISTANT Resistant     PIP/TAZO 16 SENSITIVE Sensitive     * FEW KLEBSIELLA PNEUMONIAE   Staphylococcus aureus - MIC*    CIPROFLOXACIN <=0.5 SENSITIVE Sensitive     ERYTHROMYCIN <=0.25 SENSITIVE Sensitive  GENTAMICIN <=0.5 SENSITIVE Sensitive     OXACILLIN <=0.25 SENSITIVE Sensitive     TETRACYCLINE <=1 SENSITIVE Sensitive     VANCOMYCIN 1 SENSITIVE Sensitive     TRIMETH/SULFA <=10 SENSITIVE Sensitive     CLINDAMYCIN <=0.25 SENSITIVE Sensitive     RIFAMPIN <=0.5 SENSITIVE Sensitive     Inducible Clindamycin NEGATIVE Sensitive     * FEW STAPHYLOCOCCUS AUREUS  Culture, blood (routine x 2)     Status: None   Collection Time: 05/29/21 12:48 PM   Specimen: BLOOD LEFT HAND  Result Value Ref Range Status   Specimen Description BLOOD LEFT HAND  Final   Special Requests   Final    BOTTLES DRAWN AEROBIC ONLY Blood Culture adequate volume   Culture   Final    NO GROWTH 5 DAYS Performed at [facility], [address] [place], [place] [address]    Report Status 06/03/2021 FINAL  Final   Culture, blood (routine x 2)     Status: None   Collection Time: 05/29/21  1:04 PM   Specimen: BLOOD RIGHT HAND  Result Value Ref Range Status   Specimen Description BLOOD RIGHT HAND  Final   Special Requests   Final    BOTTLES DRAWN AEROBIC AND ANAEROBIC Blood Culture adequate volume   Culture   Final    NO GROWTH 5 DAYS Performed at [facility], [place] [address], [place], [place] [address]    Report Status 06/03/2021 FINAL  Final      Imaging Studies   DG CHEST PORT 1 VIEW  Result Date: 06/07/2021 CLINICAL DATA:  Dyspnea, unresponsive EXAM: PORTABLE CHEST 1 VIEW COMPARISON:  05/30/2021 FINDINGS: Single frontal view of the chest demonstrates a stable tracheostomy tube. Cardiac silhouette is unremarkable. There is mild diffuse interstitial prominence without focal consolidation, effusion, or pneumothorax. No acute bony abnormalities. IMPRESSION: 1. Mild diffuse interstitial prominence which could reflect early interstitial edema. No acute airspace disease. Electronically Signed   By: Randa Ngo M.D.   On: 06/07/2021 15:05     Medications   Scheduled Meds:  arformoterol  15 mcg Nebulization BID   atorvastatin  40 mg Per Tube QHS   azithromycin  250 mg Per Tube Daily   budesonide (PULMICORT) nebulizer solution  0.5 mg Nebulization BID   chlorhexidine gluconate (MEDLINE KIT)  15 mL Mouth Rinse BID   Chlorhexidine Gluconate Cloth  6 each Topical Q0600   clonazePAM  0.5 mg Per Tube Q6H   cyclobenzaprine  5 mg Per Tube Q8H   docusate  200 mg Per Tube BID   feeding supplement (PROSource TF)  45 mL Per Tube BID   heparin  5,000 Units Subcutaneous Q8H   levETIRAcetam  500 mg Per Tube BID   mouth rinse  15 mL Mouth Rinse 10 times per day   metoprolol tartrate  50 mg Per Tube Q8H   oxyCODONE  5 mg Per Tube Q6H   pantoprazole sodium  40 mg Per Tube QHS   polyethylene glycol  17 g Per Tube Daily   predniSONE  40 mg Per Tube Q breakfast   revefenacin  175 mcg Nebulization  Daily   senna  1 tablet Per Tube Daily   Continuous Infusions:  sodium chloride Stopped (06/03/21 1725)   feeding supplement (JEVITY 1.2 CAL) 75 mL/hr at 06/07/21 1400       LOS: 39 days    Time spent: 30 minutes    Ezekiel Slocumb, DO [facility]  06/07/2021, 5:13 PM      If 7PM-7AM, please contact night-coverage. How to contact the [facility] Attending or Consulting provider [place] or covering provider during after hours [place], for this patient?    Check the care team in [facility] and look for a) attending/consulting [facility] provider listed and b) the [facility] team listed Log into [email or web address] and use [place]'s universal password to access. If you do not have the password, please contact the hospital operator. Locate the [facility] provider you are looking for under [facility] and page to a number that you can be directly reached. If you still have difficulty reaching the provider, please page the [facility] (Director on Call) for the Hospitalists listed on amion for assistance.

## 2021-06-07 NOTE — Plan of Care (Signed)
  Problem: Clinical Measurements: Goal: Ability to maintain clinical measurements within normal limits will improve Outcome: Progressing Goal: Respiratory complications will improve Outcome: Progressing Goal: Cardiovascular complication will be avoided Outcome: Progressing   

## 2021-06-07 NOTE — Plan of Care (Signed)
  Problem: Clinical Measurements: Goal: Cardiovascular complication will be avoided Outcome: Progressing   Problem: Nutrition: Goal: Adequate nutrition will be maintained Outcome: Progressing   

## 2021-06-08 DIAGNOSIS — Z930 Tracheostomy status: Secondary | ICD-10-CM | POA: Diagnosis not present

## 2021-06-08 DIAGNOSIS — I469 Cardiac arrest, cause unspecified: Secondary | ICD-10-CM | POA: Diagnosis not present

## 2021-06-08 DIAGNOSIS — J9601 Acute respiratory failure with hypoxia: Secondary | ICD-10-CM | POA: Diagnosis not present

## 2021-06-08 LAB — BASIC METABOLIC PANEL
Anion gap: 14 (ref 5–15)
BUN: 36 mg/dL — ABNORMAL HIGH (ref 6–20)
CO2: 27 mmol/L (ref 22–32)
Calcium: 10.6 mg/dL — ABNORMAL HIGH (ref 8.9–10.3)
Chloride: 94 mmol/L — ABNORMAL LOW (ref 98–111)
Creatinine, Ser: 0.82 mg/dL (ref 0.61–1.24)
GFR, Estimated: >60 mL/min (ref >60)
Glucose, Bld: 106 mg/dL — ABNORMAL HIGH (ref 70–99)
Potassium: 4.6 mmol/L (ref 3.5–5.1)
Sodium: 135 mmol/L (ref 135–145)

## 2021-06-08 LAB — GLUCOSE, CAPILLARY
Glucose-Capillary: 116 mg/dL — ABNORMAL HIGH (ref 70–99)
Glucose-Capillary: 134 mg/dL — ABNORMAL HIGH (ref 70–99)
Glucose-Capillary: 140 mg/dL — ABNORMAL HIGH (ref 70–99)
Glucose-Capillary: 156 mg/dL — ABNORMAL HIGH (ref 70–99)
Glucose-Capillary: 156 mg/dL — ABNORMAL HIGH (ref 70–99)
Glucose-Capillary: 201 mg/dL — ABNORMAL HIGH (ref 70–99)

## 2021-06-08 LAB — CBC
HCT: 40.4 % (ref 39.0–52.0)
Hemoglobin: 13.2 g/dL (ref 13.0–17.0)
MCH: 28.3 pg (ref 26.0–34.0)
MCHC: 32.7 g/dL (ref 30.0–36.0)
MCV: 86.7 fL (ref 80.0–100.0)
Platelets: 475 10*3/uL — ABNORMAL HIGH (ref 150–400)
RBC: 4.66 MIL/uL (ref 4.22–5.81)
RDW: 14.1 % (ref 11.5–15.5)
WBC: 22.2 10*3/uL — ABNORMAL HIGH (ref 4.0–10.5)
nRBC: 0 % (ref 0.0–0.2)

## 2021-06-08 LAB — MAGNESIUM: Magnesium: 2.2 mg/dL (ref 1.7–2.4)

## 2021-06-08 LAB — PHOSPHORUS: Phosphorus: 5.8 mg/dL — ABNORMAL HIGH (ref 2.5–4.6)

## 2021-06-08 MED ORDER — METOPROLOL TARTRATE 25 MG/10 ML ORAL SUSPENSION
75.0000 mg | Freq: Three times a day (TID) | ORAL | Status: DC
  Administered 2021-06-08 – 2021-06-11 (×9): 75 mg
  Filled 2021-06-08 (×12): qty 30

## 2021-06-08 NOTE — Progress Notes (Addendum)
PROGRESS NOTE    Vincent [name]   UTM:[id number]  DOB: [birth year]  PCP: Pcp, No    DOA: 04/29/2021 LOS: 67    Brief Narrative / Hospital Course to Date:   50 yo male smoker who was admitted to ICU after PEA cardiac arrest with ROSC after 6 minutes of resuscitation.  CT head showed an old infarct, nothing acute.  CT chest with bilateral dense lower lobe consolidations with emphysematous changes.   Complicated hospital course as follows, per PCCM.  Significant events 8/2 PEA Arrest, King airway in the field, CPR x6 minutes and epi x1, ROSC, Intubated in ED, Admitted to PCCM  8/4 Weaned off TTM, [place]ox O2 sat of 47%, started on milrinone and lasix 8/5 milrinone increased 8/6 TF stopped, OG placed to suction for ileus 8/8 Post pyloric Cortrak placed, myoclonic jerking, started Keppra, depakote 8/10 Completed antibiotic course for pneumonia. Family meeting transitioned to DNR. 8/15 Tracheostomy  8/18 Febrile overnight, started Zosyn for aspiration pneumonia 8/22 Completed Abx course for aspiration pneumonia  8/24 PEG placed 8/25 ABX started for fevers and ? Free air in abdomen. CT non-acute.  8/31 PSV wean 2 hours 9/1 Stopped lasix, farxiga, losartan, spiro with AKI. Ongoing jerking, diaphoresis. WBC increased, cultured / abx started empirically.  PCT increased. CK 2,450 9/2 start scheduled opiates for possible opiate withdrawal symptoms 9/4 add flexeril, on PSV.  Versed x1 overnight. 9/9 has tolerated trach collar for 48+hours    TRH assumed care 06/07/21.   Patient has persistent encephalopathy but otherwise stable.  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  Follow BMP.  Hypertension - on Lopressor per tube, increased 50>>75 mg TID  Tachycardia - regular rhythm on monitor. Increased beta blocker 9/11.  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 Per PCCM suspect this to be colonization. --Treat for infection if worsening clinically.  Anemia of critical illness - no evidence of bleeding. --follow CBC  Dyphagia - s/p PEG on tube feeds per dietitian PEG care.  LUL lung nodule - 2.6 x 2.1 cm abutting pleura on CT 04/29/21. --Follow up imaging in ~3 months  Hyperlipidemia - on Lipitor  Patient BMI: Body mass index is 21.2 kg/m.   DVT prophylaxis: heparin injection 5,000 Units Start: 04/29/21 2230 SCDs Start: 04/29/21 2227   Diet:     Code Status: DNR   Subjective 06/08/21    Pt's work on breathing looks improved today.  No visible secretions  when seen and lungs sound much more clear.  No acute events reported.  Patient has eyes open but does not track, respond to commands or interact.   Disposition Plan & Communication   Status is: Inpatient  Remains inpatient appropriate  because:Inpatient level of care appropriate due to severity of illness.  Requires placement, limited options due to insurance and trach/peg status.    Dispo: The patient is from: Home              Anticipated d/c is to: SNF vs LTAC?              Patient currently is not medically stable to d/c.   Difficult to place patient Yes    Consults, Procedures, Significant Events   Consultants:  PCCM  Procedures:  As above "significant events"  Antimicrobials:  Anti-infectives (From admission, onward)    Start     Dose/Rate Route Frequency Ordered Stop   06/06/21 1000  azithromycin (ZITHROMAX) tablet 250 mg  Status:  Discontinued       See Hyperspace for full Linked Orders Report.   250 mg Oral Daily 06/05/21 1014 06/05/21 1629   06/06/21 1000  azithromycin (ZITHROMAX) tablet 250 mg       See Hyperspace for full Linked Orders Report.   250 mg Per Tube Daily 06/05/21 1629 06/10/21 0959   06/05/21 1100  azithromycin (ZITHROMAX) tablet 500 mg       See Hyperspace for full Linked Orders Report.   500 mg Oral  Once 06/05/21 1014 06/05/21 1124   05/30/21 1530  vancomycin (VANCOREADY) IVPB 1250 mg/250 mL  Status:  Discontinued        1,250 mg 166.7 mL/hr over 90 Minutes Intravenous Every 24 hours 05/29/21 1504 05/30/21 1028   05/30/21 0100  ceFEPIme (MAXIPIME) 2 g in sodium chloride 0.9 % 100 mL IVPB  Status:  Discontinued        2 g 200 mL/hr over 30 Minutes Intravenous Every 12 hours 05/29/21 1504 05/30/21 1028   05/29/21 1430  ceFEPIme (MAXIPIME) 2 g in sodium chloride 0.9 % 100 mL IVPB        2 g 200 mL/hr over 30 Minutes Intravenous  Once 05/29/21 1331 05/29/21 1614   05/29/21 1430  vancomycin (VANCOREADY) IVPB 1500 mg/300 mL        1,500 mg 150 mL/hr over 120 Minutes Intravenous  Once 05/29/21 1331 05/29/21 1814   05/23/21 1000  fluconazole (DIFLUCAN) tablet 100 mg       See Hyperspace for full Linked Orders Report.   100 mg Per Tube Daily 05/22/21 1043 05/31/21 0940   05/22/21 2000   piperacillin-tazobactam (ZOSYN) IVPB 3.375 g  Status:  Discontinued        3.375 g 12.5 mL/hr over 240 Minutes Intravenous Every 8 hours 05/22/21 1227 05/23/21 1017   05/22/21 1315  piperacillin-tazobactam (ZOSYN) IVPB 3.375 g        3.375 g 100 mL/hr over 30 Minutes Intravenous  Once 05/22/21 1227 05/22/21 1404   05/22/21 1130  fluconazole (DIFLUCAN) tablet 100 mg  Status:  Discontinued        100 mg Per Tube Daily 05/22/21 1035 05/22/21 1042   05/22/21 1130  fluconazole (DIFLUCAN) tablet 200 mg       See Hyperspace for full Linked Orders Report.   200 mg Per Tube  Once 05/22/21 1043 05/22/21 1054   05/21/21 1112  ceFAZolin (ANCEF) IVPB 1 g/50 mL premix  over 30 Minutes  Continuous PRN 05/21/21 1120 05/21/21 1148   05/21/21 0600  ceFAZolin (ANCEF) IVPB 2g/100 mL premix        2 g 200 mL/hr over 30 Minutes Intravenous To Radiology 05/20/21 1318 05/21/21 1142   05/16/21 1800  Ampicillin-Sulbactam (UNASYN) 3 g in sodium chloride 0.9 % 100 mL IVPB  Status:  Discontinued        3 g 200 mL/hr over 30 Minutes Intravenous Every 6 hours 05/16/21 1107 05/19/21 1014   05/15/21 1100  piperacillin-tazobactam (ZOSYN) IVPB 3.375 g  Status:  Discontinued        3.375 g 12.5 mL/hr over 240 Minutes Intravenous Every 8 hours 05/15/21 1000 05/16/21 1107   05/05/21 1400  amoxicillin-clavulanate (AUGMENTIN) 875-125 MG per tablet 1 tablet        1 tablet Per Tube Every 12 hours 05/05/21 1358 05/06/21 1453   04/30/21 2000  ceFEPIme (MAXIPIME) 2 g in sodium chloride 0.9 % 100 mL IVPB  Status:  Discontinued        2 g 200 mL/hr over 30 Minutes Intravenous Every 12 hours 04/30/21 0814 04/30/21 0922   04/30/21 2000  cefTRIAXone (ROCEPHIN) 2 g in sodium chloride 0.9 % 100 mL IVPB  Status:  Discontinued        2 g 200 mL/hr over 30 Minutes Intravenous Every 24 hours 04/30/21 0922 05/05/21 1358   04/30/21 1015  azithromycin (ZITHROMAX) 500 mg in sodium chloride 0.9 % 250 mL IVPB  Status:  Discontinued         500 mg 250 mL/hr over 60 Minutes Intravenous Every 24 hours 04/30/21 0922 05/01/21 0925   04/30/21 0800  ceFEPIme (MAXIPIME) 2 g in sodium chloride 0.9 % 100 mL IVPB  Status:  Discontinued        2 g 200 mL/hr over 30 Minutes Intravenous Every 8 hours 04/29/21 2239 04/30/21 0814   04/29/21 2359  vancomycin (VANCOREADY) IVPB 1500 mg/300 mL  Status:  Discontinued        1,500 mg 150 mL/hr over 120 Minutes Intravenous Daily at bedtime 04/29/21 2241 04/30/21 0922   04/29/21 2200  ceFEPIme (MAXIPIME) 2 g in sodium chloride 0.9 % 100 mL IVPB        2 g 200 mL/hr over 30 Minutes Intravenous  Once 04/29/21 2151 04/30/21 0048   04/29/21 2200  azithromycin (ZITHROMAX) 500 mg in sodium chloride 0.9 % 250 mL IVPB        500 mg 250 mL/hr over 60 Minutes Intravenous  Once 04/29/21 2151 04/30/21 0256         Micro    Objective   Vitals:   06/08/21 0838 06/08/21 0848 06/08/21 0849 06/08/21 1127  BP:    (!) 129/104  Pulse:   (!) 103 (!) 117  Resp:   (!) 27 (!) 21  Temp:    97.7 F (36.5 C)  TempSrc:    Axillary  SpO2: 100% 99% 99% 100%  Weight:      Height:        Intake/Output Summary (Last 24 hours) at 06/08/2021 1534 Last data filed at 06/08/2021 0400 Gross per 24 hour  Intake 1835 ml  Output 350 ml  Net 1485 ml   Filed Weights   06/03/21 0500 06/04/21 0500 06/08/21 0353  Weight: 68.3 kg 68.4 kg 63.2 kg    Physical Exam:  General exam: eyes open, unresponsive, no acute distress HEENT: face diaphoretic, on trach collar, moist mucus membranes, hearing grossly normal, eyes  open Respiratory system: lungs clear, no wheezes, respiratory effort near normal today (not belly breathing). Cardiovascular system: normal S1/S2, tachycardic, regular rhythm, no pedal edema.   Gastrointestinal system: mildly distended, non-tender, PEG in place. Central nervous system: pt unresponsive but awake, unable to follow commands to assess, no myoclonic jerking seen Extremities: offloading boots on b/l  feet   Labs   Data Reviewed: I have personally reviewed following labs and imaging studies  CBC: Recent Labs  Lab 06/04/21 0123 06/08/21 0102  WBC 11.2* 22.2*  HGB 12.4* 13.2  HCT 37.3* 40.4  MCV 87.4 86.7  PLT 304 592*   Basic Metabolic Panel: Recent Labs  Lab 06/04/21 0123 06/07/21 0304 06/08/21 0102  NA 132* 136 135  K 4.8 5.0 4.6  CL 94* 94* 94*  CO2 27 33* 27  GLUCOSE 143* 151* 106*  BUN 24* 33* 36*  CREATININE 0.75 0.82 0.82  CALCIUM 9.8 11.0* 10.6*  MG  --   --  2.2  PHOS  --   --  5.8*   GFR: Estimated Creatinine Clearance: 95.3 mL/min (by C-G formula based on SCr of 0.82 mg/dL). Liver Function Tests: No results for input(s): AST, ALT, ALKPHOS, BILITOT, PROT, ALBUMIN in the last 168 hours. No results for input(s): LIPASE, AMYLASE in the last 168 hours. No results for input(s): AMMONIA in the last 168 hours. Coagulation Profile: No results for input(s): INR, PROTIME in the last 168 hours. Cardiac Enzymes: No results for input(s): CKTOTAL, CKMB, CKMBINDEX, TROPONINI in the last 168 hours. BNP (last 3 results) No results for input(s): PROBNP in the last 8760 hours. HbA1C: No results for input(s): HGBA1C in the last 72 hours. CBG: Recent Labs  Lab 06/07/21 2038 06/08/21 0006 06/08/21 0351 06/08/21 0723 06/08/21 1125  GLUCAP 179* 116* 134* 140* 156*   Lipid Profile: No results for input(s): CHOL, HDL, LDLCALC, TRIG, CHOLHDL, LDLDIRECT in the last 72 hours. Thyroid Function Tests: No results for input(s): TSH, T4TOTAL, FREET4, T3FREE, THYROIDAB in the last 72 hours. Anemia Panel: No results for input(s): VITAMINB12, FOLATE, FERRITIN, TIBC, IRON, RETICCTPCT in the last 72 hours. Sepsis Labs: No results for input(s): PROCALCITON, LATICACIDVEN in the last 168 hours.  No results found for this or any previous visit (from the past 240 hour(s)).     Imaging Studies   DG CHEST PORT 1 VIEW  Result Date: 06/07/2021 CLINICAL DATA:  Dyspnea,  unresponsive EXAM: PORTABLE CHEST 1 VIEW COMPARISON:  05/30/2021 FINDINGS: Single frontal view of the chest demonstrates a stable tracheostomy tube. Cardiac silhouette is unremarkable. There is mild diffuse interstitial prominence without focal consolidation, effusion, or pneumothorax. No acute bony abnormalities. IMPRESSION: 1. Mild diffuse interstitial prominence which could reflect early interstitial edema. No acute airspace disease. Electronically Signed   By: Randa Ngo M.D.   On: 06/07/2021 15:05     Medications   Scheduled Meds:  arformoterol  15 mcg Nebulization BID   atorvastatin  40 mg Per Tube QHS   azithromycin  250 mg Per Tube Daily   budesonide (PULMICORT) nebulizer solution  0.5 mg Nebulization BID   chlorhexidine gluconate (MEDLINE KIT)  15 mL Mouth Rinse BID   Chlorhexidine Gluconate Cloth  6 each Topical Q0600   clonazePAM  0.5 mg Per Tube Q6H   cyclobenzaprine  5 mg Per Tube Q8H   docusate  200 mg Per Tube BID   feeding supplement (PROSource TF)  45 mL Per Tube BID   heparin  5,000 Units Subcutaneous Q8H   levETIRAcetam  500 mg Per Tube BID   mouth rinse  15 mL Mouth Rinse 10 times per day   metoprolol tartrate  50 mg Per Tube Q8H   oxyCODONE  5 mg Per Tube Q6H   pantoprazole sodium  40 mg Per Tube QHS   polyethylene glycol  17 g Per Tube Daily   predniSONE  40 mg Per Tube Q breakfast   revefenacin  175 mcg Nebulization Daily   senna  1 tablet Per Tube Daily   Continuous Infusions:  sodium chloride Stopped (06/03/21 1725)   feeding supplement (JEVITY 1.2 CAL) 75 mL/hr at 06/08/21 0400       LOS: 40 days    Time spent: 30 minutes     Ezekiel Slocumb, DO [facility]  06/08/2021, 3:34 PM      If 7PM-7AM, please contact night-coverage. How to contact the [facility] Attending or Consulting provider [place] or covering provider during after hours [place], for this patient?    Check the care team in [facility] and look for a) attending/consulting TR[facility]  provider listed and b) the [facility] team listed Log into [email or web address] and use [place]'s universal password to access. If you do not have the password, please contact the hospital operator. Locate the [facility] provider you are looking for under [facility] and page to a number that you can be directly reached. If you still have difficulty reaching the provider, please page the [facility] (Director on Call) for the Hospitalists listed on amion for assistance.

## 2021-06-09 ENCOUNTER — Inpatient Hospital Stay (HOSPITAL_COMMUNITY): Payer: Medicaid Other

## 2021-06-09 DIAGNOSIS — I469 Cardiac arrest, cause unspecified: Secondary | ICD-10-CM | POA: Diagnosis not present

## 2021-06-09 DIAGNOSIS — Z930 Tracheostomy status: Secondary | ICD-10-CM | POA: Diagnosis not present

## 2021-06-09 DIAGNOSIS — J9601 Acute respiratory failure with hypoxia: Secondary | ICD-10-CM | POA: Diagnosis not present

## 2021-06-09 LAB — BASIC METABOLIC PANEL
Anion gap: 13 (ref 5–15)
BUN: 44 mg/dL — ABNORMAL HIGH (ref 6–20)
CO2: 28 mmol/L (ref 22–32)
Calcium: 10.9 mg/dL — ABNORMAL HIGH (ref 8.9–10.3)
Chloride: 97 mmol/L — ABNORMAL LOW (ref 98–111)
Creatinine, Ser: 1.18 mg/dL (ref 0.61–1.24)
GFR, Estimated: >60 mL/min (ref >60)
Glucose, Bld: 144 mg/dL — ABNORMAL HIGH (ref 70–99)
Potassium: 5.1 mmol/L (ref 3.5–5.1)
Sodium: 138 mmol/L (ref 135–145)

## 2021-06-09 LAB — GLUCOSE, CAPILLARY
Glucose-Capillary: 148 mg/dL — ABNORMAL HIGH (ref 70–99)
Glucose-Capillary: 154 mg/dL — ABNORMAL HIGH (ref 70–99)
Glucose-Capillary: 162 mg/dL — ABNORMAL HIGH (ref 70–99)
Glucose-Capillary: 169 mg/dL — ABNORMAL HIGH (ref 70–99)

## 2021-06-09 LAB — CBC
HCT: 44.8 % (ref 39.0–52.0)
Hemoglobin: 14.4 g/dL (ref 13.0–17.0)
MCH: 28.3 pg (ref 26.0–34.0)
MCHC: 32.1 g/dL (ref 30.0–36.0)
MCV: 88 fL (ref 80.0–100.0)
Platelets: 513 10*3/uL — ABNORMAL HIGH (ref 150–400)
RBC: 5.09 MIL/uL (ref 4.22–5.81)
RDW: 14.4 % (ref 11.5–15.5)
WBC: 24.4 10*3/uL — ABNORMAL HIGH (ref 4.0–10.5)
nRBC: 0.1 % (ref 0.0–0.2)

## 2021-06-09 LAB — LACTIC ACID, PLASMA
Lactic Acid, Venous: 1.9 mmol/L (ref 0.5–1.9)
Lactic Acid, Venous: 2.8 mmol/L (ref 0.5–1.9)

## 2021-06-09 LAB — BRAIN NATRIURETIC PEPTIDE: B Natriuretic Peptide: 406.5 pg/mL — ABNORMAL HIGH (ref 0.0–100.0)

## 2021-06-09 LAB — TROPONIN I (HIGH SENSITIVITY)
Troponin I (High Sensitivity): 36 ng/L — ABNORMAL HIGH (ref <18)
Troponin I (High Sensitivity): 38 ng/L — ABNORMAL HIGH (ref <18)

## 2021-06-09 LAB — D-DIMER, QUANTITATIVE: D-Dimer, Quant: 0.62 ug/mL-FEU — ABNORMAL HIGH (ref 0.00–0.50)

## 2021-06-09 LAB — CK: Total CK: 183 U/L (ref 49–397)

## 2021-06-09 LAB — PROCALCITONIN: Procalcitonin: <0.1 ng/mL

## 2021-06-09 MED ORDER — METOPROLOL TARTRATE 5 MG/5ML IV SOLN
5.0000 mg | Freq: Once | INTRAVENOUS | Status: AC
  Administered 2021-06-09: 5 mg via INTRAVENOUS
  Filled 2021-06-09: qty 5

## 2021-06-09 MED ORDER — PIPERACILLIN-TAZOBACTAM 3.375 G IVPB
3.3750 g | Freq: Three times a day (TID) | INTRAVENOUS | Status: DC
  Administered 2021-06-09 – 2021-06-10 (×3): 3.375 g via INTRAVENOUS
  Filled 2021-06-09 (×4): qty 50

## 2021-06-09 NOTE — Progress Notes (Signed)
Pt HR increased to 130s with BP of 141/97. Pt increasingly diaphoretic. PT in nonverbal and noninteractive, so I am relying on vital signs to indicate pain. Notified on call MD. Dr. Rachael Darby ordered Metoprolol 5mg  IV push. Metoprolol and scheduled klonopin given at 0315. Pt HR down to 110 and seems more comfortable. Will continue to monitor.

## 2021-06-09 NOTE — Progress Notes (Signed)
Notified on call MD of HR in 120s-130s after 75mg  metoprolol and 5mg  Oxycodone given. MD states he is unconcerned bc patient has been in ST for several days. MD said to call back and escalate if pt becomes symptomatic. Will continue to monitor.

## 2021-06-09 NOTE — Progress Notes (Signed)
Notified by RN that pt with HR increase to 130s. Seemed diaphoretic she reported. She gave him tylenol. He is nonverbal.  EKG reported as sinus tachycardia. Is on lopressor, last dose last pm at 9 pm.   Will check troponin level x 2.  Given metoprolol 5 mg IV now.  Continue to monitor

## 2021-06-09 NOTE — Progress Notes (Signed)
RN notified

## 2021-06-09 NOTE — Progress Notes (Signed)
NAME:  Vincent [name], MRN:  [id number], DOB:  November 15, 1970, LOS: 41 ADMISSION DATE:  04/29/2021, CONSULTATION DATE:  04/29/21 REFERRING MD:  Dr. Jacqulyn Bath, CHIEF COMPLAINT:  Found Down   History of present illness   50 y/o male smoker admitted with PEA cardiac arrest with ROSC after 6 minutes.  CT head showed old infarct. CT chest showed b/l dense lower lobe consolidation with changes of emphysema. UDS positive for benzodiazepines and THC.  Past Medical History  Emphysema, CVA, DM type 2  Significant Hospital Events   8/2 PEA Arrest, King airway in the field, CPR x6 minutes and epi x1, ROSC, Intubated in ED, Admitted to PCCM  8/4 Weaned off TTM, Coox O2 sat of 47%, started on milrinone and lasix 8/5 milrinone increased 8/6 TF stopped, OG placed to suction for ileus 8/8 Post pyloric Cortrak placed, myoclonic jerking, started Keppra, depakote 8/10 Completed antibiotic course for pneumonia. Family meeting transitioned to DNR. 8/15 Tracheostomy  8/18 Febrile overnight, started Zosyn for aspiration pneumonia 8/22 Completed Abx course for aspiration pneumonia  8/24 PEG placed 8/25 ABX started for fevers and ? Free air in abdomen. CT non-acute.  8/31 PSV wean 2 hours 9/1 Stopped lasix, farxiga, losartan, spiro with AKI. Ongoing jerking, diaphoresis. WBC increased, cultured / abx started empirically.  PCT increased. CK 2,450 9/2 start scheduled opiates for possible opiate withdrawal symptoms 9/4 add flexeril, on PSV.  Versed x1 overnight. 9/9 has tolerated trach collar for 48+hours  Interim history/subjective:   Looks pretty rough this AM, increased WOB, sweating. WBC up  Objective   Blood pressure (!) 138/100, pulse (!) 110, temperature 98.7 F (37.1 C), temperature source Axillary, resp. rate (!) 23, height 5\' 8"  (1.727 m), weight 63.2 kg, SpO2 99 %.    FiO2 (%):  [28 %] 28 %   Intake/Output Summary (Last 24 hours) at 06/09/2021 1020 Last data filed at 06/09/2021 0022 Gross per 24  hour  Intake 240 ml  Output 1150 ml  Net -910 ml    Filed Weights   06/03/21 0500 06/04/21 0500 06/08/21 0353  Weight: 68.3 kg 68.4 kg 63.2 kg   Exam: Mild resp distress Diaphoretic Moves around but not to command Intermittent posturing (apparently this is chronic) Ext without edema PEG in place  Resolved Hospital Problem list   Bowel Obstruction/ileus, Acute Kidney Injury from ATN 2nd to Cardiogenic shock, Aspiration Pneumonia, Hyponatremia, Oral candidiasis  Assessment & Plan:   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 - continue klonopin 0.5 mg q 6hrs, flexeril 5mg  q 8hrs, and keppra 500mg  BID - prn versed for abnormal motor movements  Opiate withdrawal. -continue Oxy IR 5mg  q 6hrs with scheduled bowel regimen and prn's  Acute Systolic/Diastolic CHF after Cardiac Arrest. HLD. -continue lopressor 50mg  q 8hr, liptor   Acute Hypoxic Respiratory Failure from Cardiac Arrest and Aspiration Pneumonia. Failure to wean from ventilator s/p tracheostomy. Centrilobular Emphysema. - tolerating trach collar for over 48 hours - continue yupelri, brovana, pulmicort, PRN albuterol  - ongoing trach care - given ongoing wheezing, 9/8 started treatment for AECOPD with prednisone 40mg  x 5 days and azithromycin x 5 days  ESBL Klebsiella in sputum from 05/29/21. - given increased WBC and WOB, will treat, discussed with primary; will also check another culture and CXR  Anemia of critical illness -trend CBC   Dysphagia - TF per nutrition  - PEG care per protocol   LUL Nodular Density 2.6 x 2.1 cm  abutting pleura. Seen on CT chest 04/29/21 - will need follow up imaging in ~3 months depending on neuro status  Disposition. - Limited options due to insurance coverage -TOC still assessing potential discharge plan, may have to be [place] out of state.  If he can continue on ATC, hopefully will liberate options for placement.    Will check on him tomorrow Myrla Halsted MD PCCM

## 2021-06-09 NOTE — Progress Notes (Signed)
PROGRESS NOTE    Vincent [name]   CWC:[id number]  DOB: Jul 06, 1971  PCP: Pcp, No    DOA: 04/29/2021 LOS: 22    Brief Narrative / Hospital Course to Date:   50 yo male smoker who was admitted to ICU after PEA cardiac arrest with ROSC after 6 minutes of resuscitation.  CT head showed an old infarct, nothing acute.  CT chest with bilateral dense lower lobe consolidations with emphysematous changes.   Complicated hospital course as follows, per PCCM.  Significant events 8/2 PEA Arrest, [place] airway in the field, CPR x6 minutes and epi x1, ROSC, Intubated in ED, Admitted to PCCM  8/4 Weaned off TTM, [place] O2 sat of 47%, started on milrinone and lasix 8/5 milrinone increased 8/6 TF stopped, OG placed to suction for ileus 8/8 Post pyloric Cortrak placed, myoclonic jerking, started Keppra, depakote 8/10 Completed antibiotic course for pneumonia. Family meeting transitioned to DNR. 8/15 Tracheostomy  8/18 Febrile overnight, started Zosyn for aspiration pneumonia 8/22 Completed Abx course for aspiration pneumonia  8/24 PEG placed 8/25 ABX started for fevers and ? Free air in abdomen. CT non-acute.  8/31 PSV wean 2 hours 9/1 Stopped lasix, farxiga, losartan, spiro with AKI. Ongoing jerking, diaphoresis. WBC increased, cultured / abx started empirically.  PCT increased. CK 2,450 9/2 start scheduled opiates for possible opiate withdrawal symptoms 9/4 add flexeril, on PSV.  Versed x1 overnight. 9/9 has tolerated trach collar for 48+hours    TRH assumed care 06/07/21.   Patient has persistent encephalopathy but otherwise stable.  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  Sepsis criteria: fever, leukocytosis, lactic acidosis.  This culture was suspected due to colonization. Pt now febrile and has shown signs of worsening respiratory status, so will treat based on culture. --Start Zosyn (9/12) --Discussed with PCCM --Repeat culture  Hypercalemia - Ca 11.0 on 9/10 - given Lasix for volume overload.  Follow BMP.  Hypertension - on Lopressor per tube, increased 50>>75 mg TID  Tachycardia - regular rhythm on monitor. Increased beta blocker 9/11.  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 --follow CBC  Dyphagia - s/p PEG on tube feeds per dietitian PEG care.  LUL lung nodule - 2.6 x 2.1 cm abutting pleura on CT 04/29/21. --Follow up imaging in ~3 months  Hyperlipidemia - on Lipitor  Patient BMI: Body mass index is 21.2 kg/m.   DVT prophylaxis: heparin injection  5,000 Units Start: 04/29/21 2230 SCDs Start: 04/29/21 2227   Diet:     Code Status: DNR   Subjective 06/09/21    Pt seen during pt care with RN and NT at bedside.  Pt again today looks to  have increased WOB with abdominal muscle use.  Later today, he spiked fever of 100.9.  Not diaphoretic when seen.     Disposition Plan & Communication   Status is: Inpatient  Remains inpatient appropriate because:Inpatient level of care appropriate due to severity of illness.  Requires placement, limited options due to insurance and trach/peg status.    Dispo: The patient is from: Home              Anticipated d/c is to: SNF vs LTAC?              Patient currently is not medically stable to d/c.   Difficult to place patient Yes    Consults, Procedures, Significant Events   Consultants:  PCCM  Procedures:  As above "significant events"  Antimicrobials:  Anti-infectives (From admission, onward)    Start     Dose/Rate Route Frequency Ordered Stop   06/09/21 1045  piperacillin-tazobactam (ZOSYN) IVPB 3.375 g        3.375 g 12.5 mL/hr over 240 Minutes Intravenous Every 8 hours 06/09/21 0959     06/06/21 1000  azithromycin (ZITHROMAX) tablet 250 mg  Status:  Discontinued       See Hyperspace for full Linked Orders Report.   250 mg Oral Daily 06/05/21 1014 06/05/21 1629   06/06/21 1000  azithromycin (ZITHROMAX) tablet 250 mg       See Hyperspace for full Linked Orders Report.   250 mg Per Tube Daily 06/05/21 1629 06/09/21 1007   06/05/21 1100  azithromycin (ZITHROMAX) tablet 500 mg       See Hyperspace for full Linked Orders Report.   500 mg Oral  Once 06/05/21 1014 06/05/21 1124   05/30/21 1530  vancomycin (VANCOREADY) IVPB 1250 mg/250 mL  Status:  Discontinued        1,250 mg 166.7 mL/hr over 90 Minutes Intravenous Every 24 hours 05/29/21 1504 05/30/21 1028   05/30/21 0100  ceFEPIme (MAXIPIME) 2 g in sodium chloride 0.9 % 100 mL IVPB  Status:  Discontinued        2 g 200 mL/hr over 30 Minutes Intravenous Every 12 hours 05/29/21 1504 05/30/21 1028   05/29/21 1430  ceFEPIme (MAXIPIME) 2 g in sodium chloride 0.9 % 100 mL IVPB        2 g 200 mL/hr over 30 Minutes Intravenous   Once 05/29/21 1331 05/29/21 1614   05/29/21 1430  vancomycin (VANCOREADY) IVPB 1500 mg/300 mL        1,500 mg 150 mL/hr over 120 Minutes Intravenous  Once 05/29/21 1331 05/29/21 1814   05/23/21 1000  fluconazole (DIFLUCAN) tablet 100 mg       See Hyperspace for full Linked Orders Report.   100 mg Per Tube Daily 05/22/21 1043 05/31/21 0940   05/22/21 2000  piperacillin-tazobactam (ZOSYN) IVPB 3.375 g  Status:  Discontinued        3.375 g 12.5 mL/hr over 240 Minutes Intravenous Every 8 hours 05/22/21 1227 05/23/21 1017   05/22/21 1315  piperacillin-tazobactam (ZOSYN) IVPB 3.375 g        3.375 g 100 mL/hr over 30 Minutes Intravenous  Once 05/22/21 1227 05/22/21 1404   05/22/21 1130  fluconazole (DIFLUCAN) tablet 100 mg  Status:  Discontinued        100 mg Per Tube Daily 05/22/21 1035 05/22/21 1042   05/22/21 1130  fluconazole (DIFLUCAN) tablet 200 mg       See Hyperspace for full Linked Orders Report.   200 mg Per Tube  Once 05/22/21 1043 05/22/21 1054   05/21/21 1112  ceFAZolin (ANCEF) IVPB 1 g/50 mL premix        over 30 Minutes  Continuous PRN 05/21/21 1120 05/21/21 1148   05/21/21 0600  ceFAZolin (ANCEF) IVPB 2g/100 mL premix        2 g 200 mL/hr over 30 Minutes Intravenous To Radiology 05/20/21 1318 05/21/21 1142   05/16/21 1800  Ampicillin-Sulbactam (UNASYN) 3 g in sodium chloride 0.9 % 100 mL IVPB  Status:  Discontinued        3 g 200 mL/hr over 30 Minutes Intravenous Every 6 hours 05/16/21 1107 05/19/21 1014   05/15/21 1100  piperacillin-tazobactam (ZOSYN) IVPB 3.375 g  Status:  Discontinued        3.375 g 12.5 mL/hr over 240 Minutes Intravenous Every 8 hours 05/15/21 1000 05/16/21 1107   05/05/21 1400  amoxicillin-clavulanate (AUGMENTIN) 875-125 MG per tablet 1 tablet        1 tablet Per Tube Every 12 hours 05/05/21 1358 05/06/21 1453   04/30/21 2000  ceFEPIme (MAXIPIME) 2 g in sodium chloride 0.9 % 100 mL IVPB  Status:  Discontinued        2 g 200 mL/hr over 30 Minutes  Intravenous Every 12 hours 04/30/21 0814 04/30/21 0922   04/30/21 2000  cefTRIAXone (ROCEPHIN) 2 g in sodium chloride 0.9 % 100 mL IVPB  Status:  Discontinued        2 g 200 mL/hr over 30 Minutes Intravenous Every 24 hours 04/30/21 0922 05/05/21 1358   04/30/21 1015  azithromycin (ZITHROMAX) 500 mg in sodium chloride 0.9 % 250 mL IVPB  Status:  Discontinued        500 mg 250 mL/hr over 60 Minutes Intravenous Every 24 hours 04/30/21 0922 05/01/21 0925   04/30/21 0800  ceFEPIme (MAXIPIME) 2 g in sodium chloride 0.9 % 100 mL IVPB  Status:  Discontinued        2 g 200 mL/hr over 30 Minutes Intravenous Every 8 hours 04/29/21 2239 04/30/21 0814   04/29/21 2359  vancomycin (VANCOREADY) IVPB 1500 mg/300 mL  Status:  Discontinued        1,500 mg 150 mL/hr over 120 Minutes Intravenous Daily at bedtime 04/29/21 2241 04/30/21 0922   04/29/21 2200  ceFEPIme (MAXIPIME) 2 g in sodium chloride 0.9 % 100 mL IVPB        2 g 200 mL/hr over 30 Minutes Intravenous  Once 04/29/21 2151 04/30/21 0048   04/29/21 2200  azithromycin (ZITHROMAX) 500 mg in sodium chloride 0.9 % 250 mL IVPB        500 mg 250 mL/hr over 60 Minutes Intravenous  Once 04/29/21 2151 04/30/21 0256         Micro    Objective   Vitals:   06/09/21 1143 06/09/21 1334 06/09/21 1535 06/09/21 1553  BP: 137/84 (!) 144/64  129/78  Pulse: (!) 126 (!) 128 (!) 115   Resp: (!) 23  (!) 22   Temp: (!) 100.7 F (38.2 C) 98.8 F (37.1 C)  98.7 F (37.1 C)  TempSrc: Axillary Axillary  Axillary  SpO2: 99%  99%   Weight:      Height:  Intake/Output Summary (Last 24 hours) at 06/09/2021 1829 Last data filed at 06/09/2021 0022 Gross per 24 hour  Intake 240 ml  Output 600 ml  Net -360 ml   Filed Weights   06/03/21 0500 06/04/21 0500 06/08/21 0353  Weight: 68.3 kg 68.4 kg 63.2 kg    Physical Exam:  General exam: eyes open, unresponsive, no acute distress HEENT: on trach collar, moist mucus membranes, eyes open Respiratory  system: lungs generally clear, no wheezes, respiratory effort increased again with abdominal muscle use. Cardiovascular system: normal S1/S2, tachycardic, regular rhythm, no pedal edema.   Gastrointestinal system: distended, non-tender, PEG in place. Central nervous system: pt unresponsive but awake, unable to follow commands to assess Extremities: offloading boots on b/l feet, no peripheral edema   Labs   Data Reviewed: I have personally reviewed following labs and imaging studies  CBC: Recent Labs  Lab 06/04/21 0123 06/08/21 0102 06/09/21 0350  WBC 11.2* 22.2* 24.4*  HGB 12.4* 13.2 14.4  HCT 37.3* 40.4 44.8  MCV 87.4 86.7 88.0  PLT 304 475* 409*   Basic Metabolic Panel: Recent Labs  Lab 06/04/21 0123 06/07/21 0304 06/08/21 0102 06/09/21 0350  NA 132* 136 135 138  K 4.8 5.0 4.6 5.1  CL 94* 94* 94* 97*  CO2 27 33* 27 28  GLUCOSE 143* 151* 106* 144*  BUN 24* 33* 36* 44*  CREATININE 0.75 0.82 0.82 1.18  CALCIUM 9.8 11.0* 10.6* 10.9*  MG  --   --  2.2  --   PHOS  --   --  5.8*  --    GFR: Estimated Creatinine Clearance: 66.2 mL/min (by C-G formula based on SCr of 1.18 mg/dL). Liver Function Tests: No results for input(s): AST, ALT, ALKPHOS, BILITOT, PROT, ALBUMIN in the last 168 hours. No results for input(s): LIPASE, AMYLASE in the last 168 hours. No results for input(s): AMMONIA in the last 168 hours. Coagulation Profile: No results for input(s): INR, PROTIME in the last 168 hours. Cardiac Enzymes: Recent Labs  Lab 06/09/21 0910  CKTOTAL 183   BNP (last 3 results) No results for input(s): PROBNP in the last 8760 hours. HbA1C: No results for input(s): HGBA1C in the last 72 hours. CBG: Recent Labs  Lab 06/08/21 1600 06/08/21 2141 06/09/21 0803 06/09/21 1141 06/09/21 1544  GLUCAP 201* 156* 148* 154* 162*   Lipid Profile: No results for input(s): CHOL, HDL, LDLCALC, TRIG, CHOLHDL, LDLDIRECT in the last 72 hours. Thyroid Function Tests: No results for  input(s): TSH, T4TOTAL, FREET4, T3FREE, THYROIDAB in the last 72 hours. Anemia Panel: No results for input(s): VITAMINB12, FOLATE, FERRITIN, TIBC, IRON, RETICCTPCT in the last 72 hours. Sepsis Labs: Recent Labs  Lab 06/09/21 0350 06/09/21 0910 06/09/21 1146  PROCALCITON <0.10  --   --   LATICACIDVEN  --  1.9 2.8*    No results found for this or any previous visit (from the past 240 hour(s)).     Imaging Studies   DG Chest Port 1 View  Result Date: 06/09/2021 CLINICAL DATA:  Tracheostomy, pneumonia EXAM: PORTABLE CHEST 1 VIEW COMPARISON:  06/07/2021 FINDINGS: Tracheostomy is unchanged. Heart is normal size. Mild interstitial prominence again noted in the lungs, left greater than right, similar to prior study. No effusions or acute bony abnormality. IMPRESSION: Stable mild interstitial prominence throughout the lungs, left greater than right could reflect interstitial edema or atypical infection. Electronically Signed   By: Rolm Baptise M.D.   On: 06/09/2021 10:08     Medications  Scheduled Meds:  arformoterol  15 mcg Nebulization BID   atorvastatin  40 mg Per Tube QHS   budesonide (PULMICORT) nebulizer solution  0.5 mg Nebulization BID   chlorhexidine gluconate (MEDLINE KIT)  15 mL Mouth Rinse BID   Chlorhexidine Gluconate Cloth  6 each Topical Q0600   clonazePAM  0.5 mg Per Tube Q6H   cyclobenzaprine  5 mg Per Tube Q8H   docusate  200 mg Per Tube BID   feeding supplement (PROSource TF)  45 mL Per Tube BID   heparin  5,000 Units Subcutaneous Q8H   levETIRAcetam  500 mg Per Tube BID   mouth rinse  15 mL Mouth Rinse 10 times per day   metoprolol tartrate  75 mg Per Tube Q8H   oxyCODONE  5 mg Per Tube Q6H   pantoprazole sodium  40 mg Per Tube QHS   polyethylene glycol  17 g Per Tube Daily   revefenacin  175 mcg Nebulization Daily   senna  1 tablet Per Tube Daily   Continuous Infusions:  sodium chloride Stopped (06/03/21 1725)   feeding supplement (JEVITY 1.2 CAL) 1,000 mL  (06/08/21 2350)   piperacillin-tazobactam (ZOSYN)  IV 3.375 g (06/09/21 1810)       LOS: 41 days    Time spent: 40 minutes with > 50% spent at bedside and in coordination of care     Ezekiel Slocumb, DO [facility]  06/09/2021, 6:29 PM      If 7PM-7AM, please contact night-coverage. How to contact the [facility] Attending or Consulting provider [place] or covering provider during after hours [place], for this patient?    Check the care team in [facility] and look for a) attending/consulting [facility] provider listed and b) the [facility] team listed Log into [email or web address] and use [place] universal password to access. If you do not have the password, please contact the hospital operator. Locate the [facility] provider you are looking for under [facility] and page to a number that you can be directly reached. If you still have difficulty reaching the provider, please page the [facility] (Director on Call) for the Hospitalists listed on amion for assistance.

## 2021-06-09 NOTE — Progress Notes (Signed)
Pharmacy Antibiotic Note  Vincent [name] is a 50 y.o. male admitted on 04/29/2021 with PEA arrest. Pt has had complicated hospital course. Currently on trach collar, concern for recurrent PNA. Pharmacy has been consulted for Zosyn dosing. Cr trending up, procal negative.  Plan: Zosyn 3.375g IV EI q8h  Height: 5\' 8"  (172.7 cm) Weight: 63.2 kg (139 lb 6.4 oz) IBW/kg (Calculated) : 68.4  Temp (24hrs), Avg:98.1 F (36.7 C), Min:97.7 F (36.5 C), Max:98.7 F (37.1 C)  Recent Labs  Lab 06/04/21 0123 06/07/21 0304 06/08/21 0102 06/09/21 0350  WBC 11.2*  --  22.2* 24.4*  CREATININE 0.75 0.82 0.82 1.18    Estimated Creatinine Clearance: 66.2 mL/min (by C-G formula based on SCr of 1.18 mg/dL).    No Known Allergies  Antimicrobials this admission: CTX 8/3>>8/7 Augmentin 8/8 >> 8/9 Zosyn 8/18>> 8/19; 8/25>>8/26; 9/12 >> Unasyn 8/19 >>8/22 Fluconazole 8/25>>9/3 Vanc 9/1 x1 Cefepime 9/1 x1 Azithromycin 9/8>>(9/13)  Microbiology results: 8/2 MRSA PCR negative 8/2 COVID/Flu negative 8/18 TA >> few staph epi (ox resistant) - likely contaminant 9/1 resp cx: few ESBL Klebsiella pneumoniae, few staph aureus (s aureus pan-S, Klebsiella S to zosyn and imipenem) 9/1 bcx: ngF  Thank you for allowing pharmacy to be a part of this patient's care.  11/1, PharmD, BCPS, [facility] Clinical Pharmacist [phone] Please check AMION for all [facility] Pharmacy numbers 06/09/2021

## 2021-06-10 ENCOUNTER — Inpatient Hospital Stay (HOSPITAL_COMMUNITY): Payer: Medicaid Other

## 2021-06-10 DIAGNOSIS — J40 Bronchitis, not specified as acute or chronic: Secondary | ICD-10-CM

## 2021-06-10 DIAGNOSIS — G931 Anoxic brain damage, not elsewhere classified: Secondary | ICD-10-CM | POA: Diagnosis not present

## 2021-06-10 DIAGNOSIS — Z930 Tracheostomy status: Secondary | ICD-10-CM | POA: Diagnosis not present

## 2021-06-10 DIAGNOSIS — J9601 Acute respiratory failure with hypoxia: Secondary | ICD-10-CM | POA: Diagnosis not present

## 2021-06-10 LAB — GLUCOSE, CAPILLARY
Glucose-Capillary: 162 mg/dL — ABNORMAL HIGH (ref 70–99)
Glucose-Capillary: 168 mg/dL — ABNORMAL HIGH (ref 70–99)
Glucose-Capillary: 129 mg/dL — ABNORMAL HIGH (ref 70–99)
Glucose-Capillary: 145 mg/dL — ABNORMAL HIGH (ref 70–99)
Glucose-Capillary: 143 mg/dL — ABNORMAL HIGH (ref 70–99)

## 2021-06-10 LAB — COMPREHENSIVE METABOLIC PANEL
ALT: 98 U/L — ABNORMAL HIGH (ref 0–44)
AST: 54 U/L — ABNORMAL HIGH (ref 15–41)
Albumin: 4 g/dL (ref 3.5–5.0)
Alkaline Phosphatase: 61 U/L (ref 38–126)
Anion gap: 13 (ref 5–15)
BUN: 51 mg/dL — ABNORMAL HIGH (ref 6–20)
CO2: 28 mmol/L (ref 22–32)
Calcium: 10.9 mg/dL — ABNORMAL HIGH (ref 8.9–10.3)
Chloride: 100 mmol/L (ref 98–111)
Creatinine, Ser: 1.32 mg/dL — ABNORMAL HIGH (ref 0.61–1.24)
GFR, Estimated: >60 mL/min (ref >60)
Glucose, Bld: 140 mg/dL — ABNORMAL HIGH (ref 70–99)
Potassium: 4.5 mmol/L (ref 3.5–5.1)
Sodium: 141 mmol/L (ref 135–145)
Total Bilirubin: 0.6 mg/dL (ref 0.3–1.2)
Total Protein: 8.7 g/dL — ABNORMAL HIGH (ref 6.5–8.1)

## 2021-06-10 LAB — CBC
HCT: 41.9 % (ref 39.0–52.0)
Hemoglobin: 13.6 g/dL (ref 13.0–17.0)
MCH: 28.6 pg (ref 26.0–34.0)
MCHC: 32.5 g/dL (ref 30.0–36.0)
MCV: 88 fL (ref 80.0–100.0)
Platelets: 474 10*3/uL — ABNORMAL HIGH (ref 150–400)
RBC: 4.76 MIL/uL (ref 4.22–5.81)
RDW: 15 % (ref 11.5–15.5)
WBC: 25.9 10*3/uL — ABNORMAL HIGH (ref 4.0–10.5)
nRBC: 0.2 % (ref 0.0–0.2)

## 2021-06-10 LAB — PROCALCITONIN: Procalcitonin: 0.13 ng/mL

## 2021-06-10 LAB — TROPONIN I (HIGH SENSITIVITY)
Troponin I (High Sensitivity): 41 ng/L — ABNORMAL HIGH (ref <18)
Troponin I (High Sensitivity): 56 ng/L — ABNORMAL HIGH (ref <18)

## 2021-06-10 LAB — LACTIC ACID, PLASMA: Lactic Acid, Venous: 1.5 mmol/L (ref 0.5–1.9)

## 2021-06-10 MED ORDER — HYDROMORPHONE HCL 1 MG/ML IJ SOLN
1.0000 mg | INTRAMUSCULAR | Status: AC | PRN
  Administered 2021-06-11 (×3): 1 mg via INTRAVENOUS
  Filled 2021-06-10 (×3): qty 1

## 2021-06-10 MED ORDER — HYDROMORPHONE HCL 1 MG/ML IJ SOLN
1.0000 mg | INTRAMUSCULAR | Status: AC | PRN
  Administered 2021-06-10 (×3): 1 mg via INTRAVENOUS
  Filled 2021-06-10 (×3): qty 1

## 2021-06-10 MED ORDER — IOHEXOL 350 MG/ML SOLN
80.0000 mL | Freq: Once | INTRAVENOUS | Status: AC | PRN
  Administered 2021-06-10: 80 mL via INTRAVENOUS

## 2021-06-10 MED ORDER — FUROSEMIDE 10 MG/ML IJ SOLN
40.0000 mg | Freq: Once | INTRAMUSCULAR | Status: AC
  Administered 2021-06-10: 40 mg via INTRAVENOUS
  Filled 2021-06-10: qty 4

## 2021-06-10 MED ORDER — SODIUM CHLORIDE 0.9 % IV SOLN
1.0000 g | Freq: Three times a day (TID) | INTRAVENOUS | Status: DC
  Administered 2021-06-10 – 2021-06-17 (×22): 1 g via INTRAVENOUS
  Filled 2021-06-10 (×23): qty 1

## 2021-06-10 NOTE — Progress Notes (Signed)
PROGRESS NOTE    Vincent [name]   SVX:[id number]  DOB: 1971-06-15  PCP: Pcp, No    DOA: 04/29/2021 LOS: 16    Brief Narrative / Hospital Course to Date:   50 yo male smoker who was admitted to ICU after PEA cardiac arrest with ROSC after 6 minutes of resuscitation.  CT head showed an old infarct, nothing acute.  CT chest with bilateral dense lower lobe consolidations with emphysematous changes.   Complicated hospital course as follows, per PCCM.  Significant events 8/2 PEA Arrest, [place] airway in the field, CPR x6 minutes and epi x1, ROSC, Intubated in ED, Admitted to PCCM  8/4 Weaned off TTM, [place] O2 sat of 47%, started on milrinone and lasix 8/5 milrinone increased 8/6 TF stopped, OG placed to suction for ileus 8/8 Post pyloric Cortrak placed, myoclonic jerking, started Keppra, depakote 8/10 Completed antibiotic course for pneumonia. Family meeting transitioned to DNR. 8/15 Tracheostomy  8/18 Febrile overnight, started Zosyn for aspiration pneumonia 8/22 Completed Abx course for aspiration pneumonia  8/24 PEG placed 8/25 ABX started for fevers and ? Free air in abdomen. CT non-acute.  8/31 PSV wean 2 hours 9/1 Stopped lasix, farxiga, losartan, spiro with AKI. Ongoing jerking, diaphoresis. WBC increased, cultured / abx started empirically.  PCT increased. CK 2,450 9/2 start scheduled opiates for possible opiate withdrawal symptoms 9/4 add flexeril, on PSV.  Versed x1 overnight. 9/9 has tolerated trach collar for 48+hours    TRH assumed care 06/07/21.   Patient has persistent encephalopathy but otherwise stable.  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   Sepsis criteria 9/12-13: fever, leukocytosis, lactic acidosis.   This culture was suspected due to colonization. Pt now febrile and has shown signs of worsening respiratory status, will treat based on culture. 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  Follow BMP.  Hypertension - on Lopressor per tube, increased 50>>75 mg TID  Tachycardia - regular rhythm on monitor. Increased beta blocker 9/11.  Suspect due to sepsis.  Opiate withdrawal - on scheduled oxyIR and bowel regimen  Anemia of critical illness - no evidence of bleeding. --follow CBC  Dyphagia - s/p PEG on tube feeds per dietitian PEG care.  LUL lung nodule - 2.6 x 2.1 cm abutting pleura on CT 04/29/21. --Follow up imaging in ~3 months  Hyperlipidemia - on Lipitor  Patient  BMI: Body mass index is 21.2 kg/m.   DVT prophylaxis: heparin injection 5,000 Units Start: 04/29/21 2230 SCDs Start: 04/29/21 2227   Diet:     Code Status: DNR   Subjective 06/10/21    Pt seen  this AM and continues to have diaphoretic face.  His arms are tensely extended and fist clenched on left.  Was able to get him to relax the arm only briefly.  Patient febrile 102.6 this AM.  Zosyn changed to [place].   Disposition Plan & Communication   Status is: Inpatient  Remains inpatient appropriate because:Inpatient level of care appropriate due to severity of illness.  Requires placement, limited options due to insurance and trach/peg status.    Dispo: The patient is from: Home              Anticipated d/c is to: SNF vs LTAC?              Patient currently is not medically stable to d/c.   Difficult to place patient Yes    Consults, Procedures, Significant Events   Consultants:  PCCM  Procedures:  As above "significant events"  Antimicrobials:  Anti-infectives (From admission, onward)    Start     Dose/Rate Route Frequency Ordered Stop   06/10/21 1000  meropenem (MERREM) 1 g in sodium chloride 0.9 % 100 mL IVPB        1 g 200 mL/hr over 30 Minutes Intravenous Every 8 hours 06/10/21 0900     06/09/21 1045  piperacillin-tazobactam (ZOSYN) IVPB 3.375 g  Status:  Discontinued        3.375 g 12.5 mL/hr over 240 Minutes Intravenous Every 8 hours 06/09/21 0959 06/10/21 0840   06/06/21 1000  azithromycin (ZITHROMAX) tablet 250 mg  Status:  Discontinued       See Hyperspace for full Linked Orders Report.   250 mg Oral Daily 06/05/21 1014 06/05/21 1629   06/06/21 1000  azithromycin (ZITHROMAX) tablet 250 mg       See Hyperspace for full Linked Orders Report.   250 mg Per Tube Daily 06/05/21 1629 06/09/21 1007   06/05/21 1100  azithromycin (ZITHROMAX) tablet 500 mg       See Hyperspace for full Linked Orders Report.   500 mg Oral  Once 06/05/21 1014 06/05/21 1124   05/30/21 1530  vancomycin (VANCOREADY) IVPB 1250 mg/250 mL  Status:  Discontinued        1,250 mg 166.7 mL/hr over 90 Minutes Intravenous Every 24 hours 05/29/21 1504 05/30/21 1028   05/30/21 0100  ceFEPIme  (MAXIPIME) 2 g in sodium chloride 0.9 % 100 mL IVPB  Status:  Discontinued        2 g 200 mL/hr over 30 Minutes Intravenous Every 12 hours 05/29/21 1504 05/30/21 1028   05/29/21 1430  ceFEPIme (MAXIPIME) 2 g in sodium chloride 0.9 % 100 mL IVPB        2 g 200 mL/hr over 30 Minutes Intravenous  Once 05/29/21 1331 05/29/21 1614   05/29/21 1430  vancomycin (VANCOREADY) IVPB 1500 mg/300 mL        1,500 mg 150 mL/hr over 120 Minutes Intravenous  Once 05/29/21 1331 05/29/21 1814   05/23/21 1000  fluconazole (DIFLUCAN) tablet 100 mg       See Hyperspace for full Linked Orders Report.   100 mg Per Tube Daily 05/22/21 1043 05/31/21 0940   05/22/21 2000  piperacillin-tazobactam (ZOSYN) IVPB 3.375 g  Status:  Discontinued  3.375 g 12.5 mL/hr over 240 Minutes Intravenous Every 8 hours 05/22/21 1227 05/23/21 1017   05/22/21 1315  piperacillin-tazobactam (ZOSYN) IVPB 3.375 g        3.375 g 100 mL/hr over 30 Minutes Intravenous  Once 05/22/21 1227 05/22/21 1404   05/22/21 1130  fluconazole (DIFLUCAN) tablet 100 mg  Status:  Discontinued        100 mg Per Tube Daily 05/22/21 1035 05/22/21 1042   05/22/21 1130  fluconazole (DIFLUCAN) tablet 200 mg       See Hyperspace for full Linked Orders Report.   200 mg Per Tube  Once 05/22/21 1043 05/22/21 1054   05/21/21 1112  ceFAZolin (ANCEF) IVPB 1 g/50 mL premix        over 30 Minutes  Continuous PRN 05/21/21 1120 05/21/21 1148   05/21/21 0600  ceFAZolin (ANCEF) IVPB 2g/100 mL premix        2 g 200 mL/hr over 30 Minutes Intravenous To Radiology 05/20/21 1318 05/21/21 1142   05/16/21 1800  Ampicillin-Sulbactam (UNASYN) 3 g in sodium chloride 0.9 % 100 mL IVPB  Status:  Discontinued        3 g 200 mL/hr over 30 Minutes Intravenous Every 6 hours 05/16/21 1107 05/19/21 1014   05/15/21 1100  piperacillin-tazobactam (ZOSYN) IVPB 3.375 g  Status:  Discontinued        3.375 g 12.5 mL/hr over 240 Minutes Intravenous Every 8 hours 05/15/21 1000 05/16/21 1107    05/05/21 1400  amoxicillin-clavulanate (AUGMENTIN) 875-125 MG per tablet 1 tablet        1 tablet Per Tube Every 12 hours 05/05/21 1358 05/06/21 1453   04/30/21 2000  ceFEPIme (MAXIPIME) 2 g in sodium chloride 0.9 % 100 mL IVPB  Status:  Discontinued        2 g 200 mL/hr over 30 Minutes Intravenous Every 12 hours 04/30/21 0814 04/30/21 0922   04/30/21 2000  cefTRIAXone (ROCEPHIN) 2 g in sodium chloride 0.9 % 100 mL IVPB  Status:  Discontinued        2 g 200 mL/hr over 30 Minutes Intravenous Every 24 hours 04/30/21 0922 05/05/21 1358   04/30/21 1015  azithromycin (ZITHROMAX) 500 mg in sodium chloride 0.9 % 250 mL IVPB  Status:  Discontinued        500 mg 250 mL/hr over 60 Minutes Intravenous Every 24 hours 04/30/21 0922 05/01/21 0925   04/30/21 0800  ceFEPIme (MAXIPIME) 2 g in sodium chloride 0.9 % 100 mL IVPB  Status:  Discontinued        2 g 200 mL/hr over 30 Minutes Intravenous Every 8 hours 04/29/21 2239 04/30/21 0814   04/29/21 2359  vancomycin (VANCOREADY) IVPB 1500 mg/300 mL  Status:  Discontinued        1,500 mg 150 mL/hr over 120 Minutes Intravenous Daily at bedtime 04/29/21 2241 04/30/21 0922   04/29/21 2200  ceFEPIme (MAXIPIME) 2 g in sodium chloride 0.9 % 100 mL IVPB        2 g 200 mL/hr over 30 Minutes Intravenous  Once 04/29/21 2151 04/30/21 0048   04/29/21 2200  azithromycin (ZITHROMAX) 500 mg in sodium chloride 0.9 % 250 mL IVPB        500 mg 250 mL/hr over 60 Minutes Intravenous  Once 04/29/21 2151 04/30/21 0256         Micro    Objective   Vitals:   06/10/21 1400 06/10/21 1510 06/10/21 1516 06/10/21 1637  BP: 129/83 129/83 103/76 [place] Kitchen)  158/109  Pulse: (!) 120 (!) 117 (!) 115 (!) 131  Resp:  (!) 27 20 (!) 29  Temp:   98.3 F (36.8 C) 98.9 F (37.2 C)  TempSrc:   Axillary Axillary  SpO2:  100% 99% 100%  Weight:      Height:        Intake/Output Summary (Last 24 hours) at 06/10/2021 1956 Last data filed at 06/10/2021 1640 Gross per 24 hour  Intake 331.25 ml   Output 600 ml  Net -268.75 ml   Filed Weights   06/03/21 0500 06/04/21 0500 06/08/21 0353  Weight: 68.3 kg 68.4 kg 63.2 kg    Physical Exam:  General exam: eyes open, unresponsive, no acute distress HEENT: on trach collar currently no visible secretions or secretion sounds, moist mucus membranes, eyes open Respiratory system: lungs generally clear, tachypneic, no wheezes, respiratory effort increased. Cardiovascular system: normal S1/S2, tachycardic, regular rhythm, no pedal edema.   Gastrointestinal system: distended, non-tender, PEG in place. Central nervous system: pt unresponsive but awake, unable to follow commands to assess Extremities: tense upper extremities but not rigid on passive ROM when able to get him to relax the arms, offloading boots on b/l feet, no peripheral edema   Labs   Data Reviewed: I have personally reviewed following labs and imaging studies  CBC: Recent Labs  Lab 06/04/21 0123 06/08/21 0102 06/09/21 0350 06/10/21 0157  WBC 11.2* 22.2* 24.4* 25.9*  HGB 12.4* 13.2 14.4 13.6  HCT 37.3* 40.4 44.8 41.9  MCV 87.4 86.7 88.0 88.0  PLT 304 475* 513* 416*   Basic Metabolic Panel: Recent Labs  Lab 06/04/21 0123 06/07/21 0304 06/08/21 0102 06/09/21 0350 06/10/21 0157  NA 132* 136 135 138 141  K 4.8 5.0 4.6 5.1 4.5  CL 94* 94* 94* 97* 100  CO2 27 33* 27 28 28   GLUCOSE 143* 151* 106* 144* 140*  BUN 24* 33* 36* 44* 51*  CREATININE 0.75 0.82 0.82 1.18 1.32*  CALCIUM 9.8 11.0* 10.6* 10.9* 10.9*  MG  --   --  2.2  --   --   PHOS  --   --  5.8*  --   --    GFR: Estimated Creatinine Clearance: 59.2 mL/min (A) (by C-G formula based on SCr of 1.32 mg/dL (H)). Liver Function Tests: Recent Labs  Lab 06/10/21 0157  AST 54*  ALT 98*  ALKPHOS 61  BILITOT 0.6  PROT 8.7*  ALBUMIN 4.0   No results for input(s): LIPASE, AMYLASE in the last 168 hours. No results for input(s): AMMONIA in the last 168 hours. Coagulation Profile: No results for  input(s): INR, PROTIME in the last 168 hours. Cardiac Enzymes: Recent Labs  Lab 06/09/21 0910  CKTOTAL 183   BNP (last 3 results) No results for input(s): PROBNP in the last 8760 hours. HbA1C: No results for input(s): HGBA1C in the last 72 hours. CBG: Recent Labs  Lab 06/09/21 2130 06/10/21 0311 06/10/21 0748 06/10/21 1129 06/10/21 1635  GLUCAP 169* 162* 168* 129* 145*   Lipid Profile: No results for input(s): CHOL, HDL, LDLCALC, TRIG, CHOLHDL, LDLDIRECT in the last 72 hours. Thyroid Function Tests: No results for input(s): TSH, T4TOTAL, FREET4, T3FREE, THYROIDAB in the last 72 hours. Anemia Panel: No results for input(s): VITAMINB12, FOLATE, FERRITIN, TIBC, IRON, RETICCTPCT in the last 72 hours. Sepsis Labs: Recent Labs  Lab 06/09/21 0350 06/09/21 0910 06/09/21 1146 06/10/21 0157  PROCALCITON <0.10  --   --  0.13  LATICACIDVEN  --  1.9  2.8*  --     Recent Results (from the past 240 hour(s))  Culture, Respiratory w Gram Stain     Status: None (Preliminary result)   Collection Time: 06/09/21  9:28 AM   Specimen: Tracheal Aspirate; Respiratory  Result Value Ref Range Status   Specimen Description TRACHEAL ASPIRATE  Final   Special Requests NONE  Final   Gram Stain   Final    FEW SQUAMOUS EPITHELIAL CELLS PRESENT FEW WBC SEEN ABUNDANT GRAM POSITIVE COCCI    Culture   Final    RARE KLEBSIELLA PNEUMONIAE ABUNDANT STAPHYLOCOCCUS AUREUS SUSCEPTIBILITIES TO FOLLOW Performed at [facility], [place] [address] [place], [place] [address]    Report Status PENDING  Incomplete       Imaging Studies   CT Angio Chest Pulmonary Embolism (PE) W or WO Contrast  Result Date: 06/10/2021 CLINICAL DATA:  PE suspected, high prob EXAM: CT ANGIOGRAPHY CHEST WITH CONTRAST TECHNIQUE: Multidetector CT imaging of the chest was performed using the standard protocol during bolus administration of intravenous contrast. Multiplanar CT image reconstructions and MIPs were obtained to  evaluate the vascular anatomy. CONTRAST:  35m OMNIPAQUE IOHEXOL 350 MG/ML SOLN COMPARISON:  04/29/2021 FINDINGS: Cardiovascular: Contrast injection is sufficient to demonstrate satisfactory opacification of the pulmonary arteries to the segmental level. There is no pulmonary embolus or evidence of right heart strain. The size of the main pulmonary artery is normal. Heart size is normal, with no pericardial effusion. The course and caliber of the aorta are normal. There is no atherosclerotic calcification. No acute aortic syndrome. Mediastinum/Nodes: Nodular focus of the upper mediastinum abutting the proximal left subclavian artery is unchanged and measures 2.7 x 2.1 cm (series 5, image 31). No axillary lymphadenopathy. Lungs/Pleura: Tracheostomy tube tip is at the level of the clavicular heads. Widespread bilateral ground-glass opacity is improved since prior study. No pleural effusion. Bilateral apical predominant emphysema. Upper Abdomen: Contrast bolus timing is not optimized for evaluation of the abdominal organs. The visualized portions of the organs of the upper abdomen are normal. Musculoskeletal: No chest wall abnormality. No bony spinal canal stenosis. Review of the MIP images confirms the above findings. IMPRESSION: 1. No pulmonary embolus or acute aortic syndrome. 2. Improved bilateral ground-glass opacity, likely improving pulmonary edema or infection. 3. Unchanged appearance of nodular focus of the upper mediastinum abutting the proximal left subclavian artery, possibly an enlarged lymph node. Follow-up chest CT recommended in 6-12 weeks following resolution of the current acute condition. Emphysema (ICD10-J43.9). Electronically Signed   By: KUlyses JarredM.D.   On: 06/10/2021 03:09   DG Chest Port 1 View  Result Date: 06/09/2021 CLINICAL DATA:  Tracheostomy, pneumonia EXAM: PORTABLE CHEST 1 VIEW COMPARISON:  06/07/2021 FINDINGS: Tracheostomy is unchanged. Heart is normal size. Mild interstitial  prominence again noted in the lungs, left greater than right, similar to prior study. No effusions or acute bony abnormality. IMPRESSION: Stable mild interstitial prominence throughout the lungs, left greater than right could reflect interstitial edema or atypical infection. Electronically Signed   By: KRolm BaptiseM.D.   On: 06/09/2021 10:08     Medications   Scheduled Meds:  arformoterol  15 mcg Nebulization BID   atorvastatin  40 mg Per Tube QHS   budesonide (PULMICORT) nebulizer solution  0.5 mg Nebulization BID   chlorhexidine gluconate (MEDLINE KIT)  15 mL Mouth Rinse BID   Chlorhexidine Gluconate Cloth  6 each Topical Q0600   clonazePAM  0.5 mg Per Tube Q6H   cyclobenzaprine  5 mg Per Tube Q8H   docusate  200 mg Per Tube BID   feeding supplement (PROSource TF)  45 mL Per Tube BID   heparin  5,000 Units Subcutaneous Q8H   levETIRAcetam  500 mg Per Tube BID   mouth rinse  15 mL Mouth Rinse 10 times per day   metoprolol tartrate  75 mg Per Tube Q8H   oxyCODONE  5 mg Per Tube Q6H   pantoprazole sodium  40 mg Per Tube QHS   polyethylene glycol  17 g Per Tube Daily   revefenacin  175 mcg Nebulization Daily   senna  1 tablet Per Tube Daily   Continuous Infusions:  sodium chloride Stopped (06/03/21 1725)   feeding supplement (JEVITY 1.2 CAL) 1,000 mL (06/08/21 2350)   meropenem (MERREM) IV 1 g (06/10/21 1808)       LOS: 42 days    Time spent: 30 minutes with > 50% spent at bedside and in coordination of care     Ezekiel Slocumb, DO [facility]  06/10/2021, 7:56 PM      If 7PM-7AM, please contact night-coverage. How to contact the [facility] Attending or Consulting provider [place] or covering provider during after hours [place], for this patient?    Check the care team in [facility] and look for a) attending/consulting [facility] provider listed and b) the [facility] team listed Log into [email or web address] and use [place]'s universal password to access. If you do not have the password,  please contact the hospital operator. Locate the [facility] provider you are looking for under [facility] and page to a number that you can be directly reached. If you still have difficulty reaching the provider, please page the [facility] (Director on Call) for the Hospitalists listed on amion for assistance.

## 2021-06-10 NOTE — Progress Notes (Signed)
BP increased to 150s systolic with HR 191Y. Patient having tremors. Versed IV pushed at 2320 and no change seen in vitals at 0000. On call MD notified. EKG obtained and showed ST. Dr. Rachael Darby came to assess patient. States he will order something for pain and a CT scan bc of elevated morning Ddimer. Will continue to monitor patient.

## 2021-06-10 NOTE — Progress Notes (Signed)
Pharmacy Antibiotic Note  Vincent [name] is a 50 y.o. male admitted on 04/29/2021 with PEA arrest. Pt has had complicated hospital course. Currently on trach collar, concern for recurrent PNA. Pharmacy was been consulted for Zosyn dosing now changing to meropenem (GNR in trach cultures with history or ESBL) -WBC= 25.9, tmax= 102.6, CrCl ~ 60  Plan: -Meropenem 1gm IV q8h -Will follow renal function, cultures and clinical progress   Height: 5\' 8"  (172.7 cm) Weight: 63.2 kg (139 lb 6.4 oz) IBW/kg (Calculated) : 68.4  Temp (24hrs), Avg:99.7 F (37.6 C), Min:98.4 F (36.9 C), Max:102.6 F (39.2 C)  Recent Labs  Lab 06/04/21 0123 06/07/21 0304 06/08/21 0102 06/09/21 0350 06/09/21 0910 06/09/21 1146 06/10/21 0157  WBC 11.2*  --  22.2* 24.4*  --   --  25.9*  CREATININE 0.75 0.82 0.82 1.18  --   --  1.32*  LATICACIDVEN  --   --   --   --  1.9 2.8*  --      Estimated Creatinine Clearance: 59.2 mL/min (A) (by C-G formula based on SCr of 1.32 mg/dL (H)).    No Known Allergies  Antimicrobials this admission: CTX 8/3>>8/7 Augmentin 8/8 >> 8/9 Zosyn 8/18>> 8/19; 8/25>>8/26; 9/12 >> 9/13 Unasyn 8/19 >>8/22 Fluconazole 8/25>>9/3 Vanc 9/1 x1 Cefepime 9/1 x1 Azithromycin 9/8>>9/12 Meropenem 9/13>>  Microbiology results: 8/2 MRSA PCR negative 8/2 COVID/Flu negative 8/18 TA >> few staph epi (ox resistant) - likely contaminant 9/1 resp cx: few ESBL Klebsiella pneumoniae, few staph aureus (s aureus pan-S, Klebsiella S to zosyn and imipenem) 9/1 bcx: ngF 9/12 trach- GNR  Thank you for allowing pharmacy to be a part of this patient's care.  11/12, PharmD Clinical Pharmacist **Pharmacist phone directory can now be found on [email or web address] ([email or web address]).  Listed under [facility].

## 2021-06-10 NOTE — TOC Progression Note (Signed)
Transition of Care [facility]) - Progression Note    Patient Details  Name: Vincent [name] MRN: [id number] Date of Birth: 1970-12-12  [facility] CM/SW Contact  Terrial Rhodes, LCSWA Phone Number: 06/10/2021, 10:08 AM  Clinical Narrative:     CSW called and left voicemail for Clydie Braun with [facility] on 9/12 and today to see if they accept managed medicaid for possible SNF placement for patient. No current SNF bed offers. CSW awaiting callback. CSW will continue to follow and assist with dc planning needs.  Expected Discharge Plan: Skilled Nursing Facility (Resides with sister ( W/C bound)) Barriers to Discharge: Vent Bed not available  Expected Discharge Plan and Services Expected Discharge Plan: Skilled Nursing Facility (Resides with sister ( W/C bound))   Discharge Planning Services: CM Consult                                           Social Determinants of Health (SDOH) Interventions    Readmission Risk Interventions No flowsheet data found.

## 2021-06-10 NOTE — Progress Notes (Signed)
NAME:  Vincent [name], MRN:  [id number], DOB:  [birth year], LOS: 42 ADMISSION DATE:  04/29/2021, CONSULTATION DATE:  04/29/21 REFERRING MD:  Dr. Jacqulyn Bath, CHIEF COMPLAINT:  Found Down   History of present illness   50 y/o male smoker admitted with PEA cardiac arrest with ROSC after 6 minutes.  CT head showed old infarct. CT chest showed b/l dense lower lobe consolidation with changes of emphysema. UDS positive for benzodiazepines and THC.  Past Medical History  Emphysema, CVA, DM type 2  Significant Hospital Events   8/2 PEA Arrest, [place] airway in the field, CPR x6 minutes and epi x1, ROSC, Intubated in ED, Admitted to PCCM  8/4 Weaned off TTM, [place]ox O2 sat of 47%, started on milrinone and lasix 8/5 milrinone increased 8/6 TF stopped, OG placed to suction for ileus 8/8 Post pyloric Cortrak placed, myoclonic jerking, started Keppra, depakote 8/10 Completed antibiotic course for pneumonia. Family meeting transitioned to DNR. 8/15 Tracheostomy  8/18 Febrile overnight, started Zosyn for aspiration pneumonia 8/22 Completed Abx course for aspiration pneumonia  8/24 PEG placed 8/25 ABX started for fevers and ? Free air in abdomen. CT non-acute.  8/31 PSV wean 2 hours 9/1 Stopped lasix, farxiga, losartan, spiro with AKI. Ongoing jerking, diaphoresis. WBC increased, cultured / abx started empirically.  PCT increased. CK 2,450 9/2 start scheduled opiates for possible opiate withdrawal symptoms 9/4 add flexeril, on PSV.  Versed x1 overnight. 9/9 has tolerated trach collar for 48+hours  Interim history/subjective:  Asked to check back again today since looked "rough" yesterday with increased shortness of breath and diaphoresis. Appears comfortable on 20% trach collar No diaphoresis Febrile 102 this morning. RT reports copious secretions   Objective   Blood pressure 118/80, pulse (!) 115, temperature (!) 102.6 F (39.2 C), temperature source Axillary, resp. rate (!) 21, height 5\' 8"  (1.727  m), weight 63.2 kg, SpO2 97 %.    FiO2 (%):  [28 %] 28 %   Intake/Output Summary (Last 24 hours) at 06/10/2021 0830 Last data filed at 06/10/2021 06/12/2021 Gross per 24 hour  Intake 331.25 ml  Output 350 ml  Net -18.75 ml    Filed Weights   06/03/21 0500 06/04/21 0500 06/08/21 0353  Weight: 68.3 kg 68.4 kg 63.2 kg   Exam: No distress, chronically ill-appearing, on trach collar No JVD, lymphadenopathy, no bleeding from trach site Does not follow commands Decreased breath sounds bilateral, no rhonchi, no accessory muscle use S1-S2 regular PEG in place   Labs show increase leukocytosis slight high procalcitonin, lactate 2.3, normal electrolytes Respiratory culture shows GNR Chest x-ray 9/12 shows mild interstitial prominence CT angiogram chest shows improved groundglass opacity compared to prior, no evidence of pneumonia  Resolved Hospital Problem list   Bowel Obstruction/ileus, Acute Kidney Injury from ATN 2nd to Cardiogenic shock, Aspiration Pneumonia, Hyponatremia, Oral candidiasis  Assessment & Plan:   New fever, leukocytosis ESBL Klebsiella & MSSA in sputum from 05/29/21. 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 Intermittent tachycardia and diaphoresis?  Related to fever versus neurologic - continue klonopin 0.5 mg q 6hrs, flexeril 5mg  q 8hrs, and keppra 500mg  BID - prn versed for abnormal motor movements  Opiate withdrawal. -continue Oxy IR 5mg  q 6hrs with scheduled bowel regimen and prn's  Acute Systolic/Diastolic CHF after Cardiac Arrest. HLD. -continue lopressor 50mg  q 8hr, liptor   Acute  Hypoxic Respiratory Failure from Cardiac Arrest and Aspiration Pneumonia. tracheostomy. Centrilobular Emphysema. -Continue trach collar as tolerated - continue yupelri, brovana,  pulmicort, PRN albuterol  - ongoing trach care -Completed treatment for exacerbation with steroids and azithromycin 9/8-9/12    Anemia of critical illness -trend CBC   Dysphagia - TF per nutrition  - PEG care per protocol   LUL Nodular Density 2.6 x 2.1 cm abutting pleura. Seen on CT chest 04/29/21, unchanged 9/15 CTA - will need follow up imaging in ~3 months depending on neuro status  Disposition. - Limited options due to insurance coverage -TOC still assessing potential discharge plan, he has been on trach collar for 3 days, hopefully this makes placement easier.  PCCM to follow again next week, please follow-up on respiratory cultures and adjust antibiotics  Cyril Mourning MD. [facility]. [place] Pager : [phone]  If no response to pager , please call [phone] until 7 pm After 7:00 pm call Elink  [phone]   06/10/2021

## 2021-06-10 NOTE — Progress Notes (Addendum)
Nutrition Follow-up  DOCUMENTATION CODES:   Not applicable  INTERVENTION:   Continue tube feeding via PEG: Jevity 1.2 at 75 ml/hr (1800 ml/day) ProSource TF 45 ml BID  140 ml free water flush every 6 hours   Tube feeding regimen provides 2240 kcal, 122 grams of protein, and 1458 ml free water. Total free water: 2018 ml daily  NUTRITION DIAGNOSIS:   Inadequate oral intake related to inability to eat as evidenced by NPO status.  Ongoing  GOAL:   Patient will meet greater than or equal to 90% of their needs  Progressing   MONITOR:   Labs, Weight trends, TF tolerance, Skin, I & O's  REASON FOR ASSESSMENT:   Ventilator, Consult Enteral/tube feeding initiation and management  ASSESSMENT:   50 yo male admitted S/P PEA cardiac arrest at home. PMH includes tobacco abuse, emphysema, CVA, diabetes.  8/15 - s/p trach 8/24 - s/p PEG 9/9- tolerated trach collar for 48 hours  Reviewed I/O's: +331 ml x 24 hours and +8.3 L since 05/27/21  Drain output: 0 ml x 24 hours  Per MD notes, CT of chest negative for PE.   Pt remains on trach collar.  Pt remains NPO and receiving TF via PEG: Jevity 1.2 @ 75 ml/hr and 45 ml Prosource TF BID. Pt tolerating regimen well.    Medications reviewed and include keppra, miralax, and senokot.   TOC actively searching for placement for pt.   Labs reviewed: CBG: 129-168 (inpatient orders for glycemic control are none).    Diet Order:   Diet Order     None       EDUCATION NEEDS:   Not appropriate for education at this time  Skin:  Skin Assessment: Reviewed RN Assessment  Last BM:  06/09/21  Height:   Ht Readings from Last 1 Encounters:  05/26/21 5\' 8"  (1.727 m)    Weight:   Wt Readings from Last 1 Encounters:  06/08/21 63.2 kg    Ideal Body Weight:  70 kg  BMI:  Body mass index is 21.2 kg/m.  Estimated Nutritional Needs:   Kcal:  2000-2200  Protein:  110-125 gm  Fluid:  >/= 2 L    08/08/21, RD, LDN,  CDCES Registered Dietitian II Certified Diabetes Care and Education Specialist Please refer to [facility] for RD and/or RD on-call/weekend/after hours pager

## 2021-06-10 NOTE — Progress Notes (Addendum)
Notified by RN that pt with sustained HR of 140-150 and has increased work of breathing.  He has had tachycardia and increased WOB all day. Is on antibiotic coverage. Elevated WBC yesterday am.  Had elevated D-dimer level this am. Is on prophylactic heparin tid.   Has not been getting lasix during day. Has HFrEF. Has EF of 20-25% on last echo.   Nonverbal. Eyes open but does not track. Has intermittent grimacing on face. CV-Sinus tachycardia. No murmur Respiratory-tachypnea, diminished breath sounds with bibasilar rales.  Abd-soft. Mildly distended. PEG in place. EKG shows sinus tachycardia with nonspecific ST changes. No acute ST elevation or depression.   Obtain CTA chest to rule out PE with elevated risk of being bed bound and had elevated D-dimer yesterday am and has persistent tachycardia.  Dilaudid given for pain control.  Lasix IV now.  Continue to monitor closely  Addendum: CTA chest negative for PE. Improved ground glass opacities bilaterally.  Pt condition improved. HR improved. Vitals stable.  Dilaudid seemed to help pt relax and not grimacing per RN

## 2021-06-11 ENCOUNTER — Inpatient Hospital Stay (HOSPITAL_COMMUNITY): Payer: Medicaid Other

## 2021-06-11 ENCOUNTER — Encounter (HOSPITAL_COMMUNITY): Payer: Self-pay | Admitting: Pulmonary Disease

## 2021-06-11 DIAGNOSIS — A419 Sepsis, unspecified organism: Principal | ICD-10-CM

## 2021-06-11 DIAGNOSIS — R569 Unspecified convulsions: Secondary | ICD-10-CM | POA: Diagnosis not present

## 2021-06-11 DIAGNOSIS — G931 Anoxic brain damage, not elsewhere classified: Secondary | ICD-10-CM | POA: Diagnosis not present

## 2021-06-11 DIAGNOSIS — J9601 Acute respiratory failure with hypoxia: Secondary | ICD-10-CM | POA: Diagnosis not present

## 2021-06-11 DIAGNOSIS — I469 Cardiac arrest, cause unspecified: Secondary | ICD-10-CM | POA: Diagnosis not present

## 2021-06-11 LAB — CBC
HCT: 43.6 % (ref 39.0–52.0)
Hemoglobin: 14 g/dL (ref 13.0–17.0)
MCH: 28.8 pg (ref 26.0–34.0)
MCHC: 32.1 g/dL (ref 30.0–36.0)
MCV: 89.7 fL (ref 80.0–100.0)
Platelets: 383 10*3/uL (ref 150–400)
RBC: 4.86 MIL/uL (ref 4.22–5.81)
RDW: 15.7 % — ABNORMAL HIGH (ref 11.5–15.5)
WBC: 28.2 10*3/uL — ABNORMAL HIGH (ref 4.0–10.5)
nRBC: 0.3 % — ABNORMAL HIGH (ref 0.0–0.2)

## 2021-06-11 LAB — BASIC METABOLIC PANEL
Anion gap: 17 — ABNORMAL HIGH (ref 5–15)
BUN: 52 mg/dL — ABNORMAL HIGH (ref 6–20)
CO2: 24 mmol/L (ref 22–32)
Calcium: 10.5 mg/dL — ABNORMAL HIGH (ref 8.9–10.3)
Chloride: 103 mmol/L (ref 98–111)
Creatinine, Ser: 1.46 mg/dL — ABNORMAL HIGH (ref 0.61–1.24)
GFR, Estimated: 58 mL/min — ABNORMAL LOW (ref >60)
Glucose, Bld: 131 mg/dL — ABNORMAL HIGH (ref 70–99)
Potassium: 4.7 mmol/L (ref 3.5–5.1)
Sodium: 144 mmol/L (ref 135–145)

## 2021-06-11 LAB — GLUCOSE, CAPILLARY
Glucose-Capillary: 159 mg/dL — ABNORMAL HIGH (ref 70–99)
Glucose-Capillary: 171 mg/dL — ABNORMAL HIGH (ref 70–99)
Glucose-Capillary: 174 mg/dL — ABNORMAL HIGH (ref 70–99)
Glucose-Capillary: 198 mg/dL — ABNORMAL HIGH (ref 70–99)
Glucose-Capillary: 205 mg/dL — ABNORMAL HIGH (ref 70–99)
Glucose-Capillary: 267 mg/dL — ABNORMAL HIGH (ref 70–99)

## 2021-06-11 LAB — CULTURE, RESPIRATORY W GRAM STAIN

## 2021-06-11 MED ORDER — PROPRANOLOL HCL 20 MG PO TABS
20.0000 mg | ORAL_TABLET | ORAL | Status: DC
  Administered 2021-06-11 – 2021-06-12 (×3): 20 mg
  Filled 2021-06-11 (×36): qty 1

## 2021-06-11 MED ORDER — METOPROLOL TARTRATE 5 MG/5ML IV SOLN
2.5000 mg | Freq: Three times a day (TID) | INTRAVENOUS | Status: DC | PRN
  Administered 2021-06-11: 2.5 mg via INTRAVENOUS
  Filled 2021-06-11: qty 5

## 2021-06-11 MED ORDER — METOPROLOL TARTRATE 5 MG/5ML IV SOLN
5.0000 mg | Freq: Once | INTRAVENOUS | Status: AC
  Administered 2021-06-11: 5 mg via INTRAVENOUS
  Filled 2021-06-11: qty 5

## 2021-06-11 MED ORDER — PROPRANOLOL HCL 20 MG PO TABS: 20.0000 mg | ORAL_TABLET | ORAL | Status: DC

## 2021-06-11 NOTE — Progress Notes (Addendum)
PROGRESS NOTE    Vincent [name]   UQJ:[id number]  DOB: 11-07-1970  PCP: Pcp, No    DOA: 04/29/2021 LOS: 60    Brief Narrative / Hospital Course to Date:   50 yo male smoker who was admitted to ICU after PEA cardiac arrest with ROSC after 6 minutes of resuscitation.  CT head showed an old infarct, nothing acute.  CT chest with bilateral dense lower lobe consolidations with emphysematous changes.   Complicated hospital course as follows, per PCCM. TRH assumed care 06/07/21.   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 / Anoxic brain injury 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 Continue Klonopin, Flexeril, Keppra Midazolam PRN abnormal muscle movements  Acute respiratory failure with hypoxia due to Cardiac Arrest and Aspiration Pneumonia Failure to wean from ventilator s/p tracheostomy Emphysema, centrilobular PCCM continues to follow for trach management Supplement O2 to maintain sat >90% Continue Yupelri, Brovana, Pulmnicort, PRN albuterol Completed 5 day course Prednisone and 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 Started Zosyn (9/12) >> Merrem 9/13 PCCM following Repeat cultures growing Klebsiella, MSSA Continue Merrem  AKI Possibly likely 2/2 sepsis Daily BMP  Acute combined systolic/diastolic  CHF s/p cardiac arrest Repeat chest x-ray pending Continue Lopressor 50 mg TID We will hold intermittent diuresis due to rising creatinine  Hypertension/tachycardia Lopressor per tube, increased 50>>75 mg TID IV metoprolol as needed  Opiate withdrawal On scheduled oxyIR and bowel regimen  Dyphagia s/p PEG on tube feeds per dietitian PEG care  LUL lung nodule 2.6 x 2.1 cmon CT 04/29/21. Follow up imaging in ~3 months if patient survives hospital course  Hyperlipidemia Lipitor  Goals of care discussion Patient with persistent encephalopathy, guarded prognosis, now noted to be septic and rapidly declining Palliative care reconsulted for further discussions of goals of care DNR    Patient BMI: Body mass index is 21.2 kg/m.   DVT prophylaxis: heparin injection 5,000 Units Start: 04/29/21 2230 SCDs Start: 04/29/21 2227   Diet:     Code Status: DNR   Subjective 06/11/21   Patient has persistent encephalopathy, guarded prognosis, noted to now be septic and declining.  Unable to follow commands.  BPM heart rate still uncontrolled with noted spikes of temperature.  Had a large BM today    Disposition Plan & Communication   Status is: Inpatient  Remains inpatient appropriate because:Inpatient level of care appropriate due to severity of illness.  Requires placement, limited options due to insurance and trach/peg status.    Dispo: The patient is from: Home              Anticipated d/c is to: SNF vs LTAC?              Patient currently is not medically stable to d/c.   Difficult to place patient Yes    Consults, Procedures, Significant  Events   Consultants:  PCCM  Procedures:  As above "significant events"  Antimicrobials:  Anti-infectives (From admission, onward)    Start     Dose/Rate Route Frequency Ordered Stop   06/10/21 1000  meropenem (MERREM) 1 g in sodium chloride 0.9 % 100 mL IVPB        1 g 200 mL/hr over 30 Minutes Intravenous Every 8 hours  06/10/21 0900     06/09/21 1045  piperacillin-tazobactam (ZOSYN) IVPB 3.375 g  Status:  Discontinued        3.375 g 12.5 mL/hr over 240 Minutes Intravenous Every 8 hours 06/09/21 0959 06/10/21 0840   06/06/21 1000  azithromycin (ZITHROMAX) tablet 250 mg  Status:  Discontinued       See Hyperspace for full Linked Orders Report.   250 mg Oral Daily 06/05/21 1014 06/05/21 1629   06/06/21 1000  azithromycin (ZITHROMAX) tablet 250 mg       See Hyperspace for full Linked Orders Report.   250 mg Per Tube Daily 06/05/21 1629 06/09/21 1007   06/05/21 1100  azithromycin (ZITHROMAX) tablet 500 mg       See Hyperspace for full Linked Orders Report.   500 mg Oral  Once 06/05/21 1014 06/05/21 1124   05/30/21 1530  vancomycin (VANCOREADY) IVPB 1250 mg/250 mL  Status:  Discontinued        1,250 mg 166.7 mL/hr over 90 Minutes Intravenous Every 24 hours 05/29/21 1504 05/30/21 1028   05/30/21 0100  ceFEPIme (MAXIPIME) 2 g in sodium chloride 0.9 % 100 mL IVPB  Status:  Discontinued        2 g 200 mL/hr over 30 Minutes Intravenous Every 12 hours 05/29/21 1504 05/30/21 1028   05/29/21 1430  ceFEPIme (MAXIPIME) 2 g in sodium chloride 0.9 % 100 mL IVPB        2 g 200 mL/hr over 30 Minutes Intravenous  Once 05/29/21 1331 05/29/21 1614   05/29/21 1430  vancomycin (VANCOREADY) IVPB 1500 mg/300 mL        1,500 mg 150 mL/hr over 120 Minutes Intravenous  Once 05/29/21 1331 05/29/21 1814   05/23/21 1000  fluconazole (DIFLUCAN) tablet 100 mg       See Hyperspace for full Linked Orders Report.   100 mg Per Tube Daily 05/22/21 1043 05/31/21 0940   05/22/21 2000  piperacillin-tazobactam (ZOSYN) IVPB 3.375 g  Status:  Discontinued        3.375 g 12.5 mL/hr over 240 Minutes Intravenous Every 8 hours 05/22/21 1227 05/23/21 1017   05/22/21 1315  piperacillin-tazobactam (ZOSYN) IVPB 3.375 g        3.375 g 100 mL/hr over 30 Minutes Intravenous  Once 05/22/21 1227 05/22/21 1404   05/22/21 1130  fluconazole (DIFLUCAN) tablet  100 mg  Status:  Discontinued        100 mg Per Tube Daily 05/22/21 1035 05/22/21 1042   05/22/21 1130  fluconazole (DIFLUCAN) tablet 200 mg       See Hyperspace for full Linked Orders Report.   200 mg Per Tube  Once 05/22/21 1043 05/22/21 1054   05/21/21 1112  ceFAZolin (ANCEF) IVPB 1 g/50 mL premix        over 30 Minutes  Continuous PRN 05/21/21 1120 05/21/21 1148   05/21/21 0600  ceFAZolin (ANCEF) IVPB 2g/100 mL premix        2 g 200 mL/hr over 30 Minutes Intravenous To Radiology 05/20/21 1318 05/21/21 1142   05/16/21 1800  Ampicillin-Sulbactam (UNASYN) 3  g in sodium chloride 0.9 % 100 mL IVPB  Status:  Discontinued        3 g 200 mL/hr over 30 Minutes Intravenous Every 6 hours 05/16/21 1107 05/19/21 1014   05/15/21 1100  piperacillin-tazobactam (ZOSYN) IVPB 3.375 g  Status:  Discontinued        3.375 g 12.5 mL/hr over 240 Minutes Intravenous Every 8 hours 05/15/21 1000 05/16/21 1107   05/05/21 1400  amoxicillin-clavulanate (AUGMENTIN) 875-125 MG per tablet 1 tablet        1 tablet Per Tube Every 12 hours 05/05/21 1358 05/06/21 1453   04/30/21 2000  ceFEPIme (MAXIPIME) 2 g in sodium chloride 0.9 % 100 mL IVPB  Status:  Discontinued        2 g 200 mL/hr over 30 Minutes Intravenous Every 12 hours 04/30/21 0814 04/30/21 0922   04/30/21 2000  cefTRIAXone (ROCEPHIN) 2 g in sodium chloride 0.9 % 100 mL IVPB  Status:  Discontinued        2 g 200 mL/hr over 30 Minutes Intravenous Every 24 hours 04/30/21 0922 05/05/21 1358   04/30/21 1015  azithromycin (ZITHROMAX) 500 mg in sodium chloride 0.9 % 250 mL IVPB  Status:  Discontinued        500 mg 250 mL/hr over 60 Minutes Intravenous Every 24 hours 04/30/21 0922 05/01/21 0925   04/30/21 0800  ceFEPIme (MAXIPIME) 2 g in sodium chloride 0.9 % 100 mL IVPB  Status:  Discontinued        2 g 200 mL/hr over 30 Minutes Intravenous Every 8 hours 04/29/21 2239 04/30/21 0814   04/29/21 2359  vancomycin (VANCOREADY) IVPB 1500 mg/300 mL  Status:   Discontinued        1,500 mg 150 mL/hr over 120 Minutes Intravenous Daily at bedtime 04/29/21 2241 04/30/21 0922   04/29/21 2200  ceFEPIme (MAXIPIME) 2 g in sodium chloride 0.9 % 100 mL IVPB        2 g 200 mL/hr over 30 Minutes Intravenous  Once 04/29/21 2151 04/30/21 0048   04/29/21 2200  azithromycin (ZITHROMAX) 500 mg in sodium chloride 0.9 % 250 mL IVPB        500 mg 250 mL/hr over 60 Minutes Intravenous  Once 04/29/21 2151 04/30/21 0256         Micro    Objective   Vitals:   06/11/21 0753 06/11/21 0844 06/11/21 1150 06/11/21 1300  BP: (!) 155/109 (!) 155/117 (!) 131/112   Pulse: (!) 127 (!) 129 (!) 137   Resp: (!) 30 (!) 22 (!) 34   Temp: 98.5 F (36.9 C)  98.3 F (36.8 C) (!) 103.1 F (39.5 C)  TempSrc: Oral  Oral Rectal  SpO2:  100% 100%   Weight:      Height:        Intake/Output Summary (Last 24 hours) at 06/11/2021 1443 Last data filed at 06/11/2021 2119 Gross per 24 hour  Intake 1090 ml  Output 800 ml  Net 290 ml   Filed Weights   06/03/21 0500 06/04/21 0500 06/08/21 0353  Weight: 68.3 kg 68.4 kg 63.2 kg    Physical Exam: General: Critically ill-appearing, unable to follow commands, unresponsive, appears to be awake Cardiovascular: S1, S2 present Respiratory: Diminished breath sounds bilaterally, increased work of breathing Abdomen: Soft, ??tender, +++distended, bowel sounds present, PEG in place Musculoskeletal: No bilateral pedal edema noted, offloading boots on bilateral feet Skin: Normal Psychiatry: Unable to assess     Labs   Data Reviewed:  I have personally reviewed following labs and imaging studies  CBC: Recent Labs  Lab 06/08/21 0102 06/09/21 0350 06/10/21 0157 06/11/21 0242  WBC 22.2* 24.4* 25.9* 28.2*  HGB 13.2 14.4 13.6 14.0  HCT 40.4 44.8 41.9 43.6  MCV 86.7 88.0 88.0 89.7  PLT 475* 513* 474* 790   Basic Metabolic Panel: Recent Labs  Lab 06/07/21 0304 06/08/21 0102 06/09/21 0350 06/10/21 0157 06/11/21 0242  NA 136  135 138 141 144  K 5.0 4.6 5.1 4.5 4.7  CL 94* 94* 97* 100 103  CO2 33* 27 28 28 24   GLUCOSE 151* 106* 144* 140* 131*  BUN 33* 36* 44* 51* 52*  CREATININE 0.82 0.82 1.18 1.32* 1.46*  CALCIUM 11.0* 10.6* 10.9* 10.9* 10.5*  MG  --  2.2  --   --   --   PHOS  --  5.8*  --   --   --    GFR: Estimated Creatinine Clearance: 53.5 mL/min (A) (by C-G formula based on SCr of 1.46 mg/dL (H)). Liver Function Tests: Recent Labs  Lab 06/10/21 0157  AST 54*  ALT 98*  ALKPHOS 61  BILITOT 0.6  PROT 8.7*  ALBUMIN 4.0   No results for input(s): LIPASE, AMYLASE in the last 168 hours. No results for input(s): AMMONIA in the last 168 hours. Coagulation Profile: No results for input(s): INR, PROTIME in the last 168 hours. Cardiac Enzymes: Recent Labs  Lab 06/09/21 0910  CKTOTAL 183   BNP (last 3 results) No results for input(s): PROBNP in the last 8760 hours. HbA1C: No results for input(s): HGBA1C in the last 72 hours. CBG: Recent Labs  Lab 06/10/21 2019 06/11/21 0005 06/11/21 0429 06/11/21 0752 06/11/21 1148  GLUCAP 143* 171* 205* 174* 198*   Lipid Profile: No results for input(s): CHOL, HDL, LDLCALC, TRIG, CHOLHDL, LDLDIRECT in the last 72 hours. Thyroid Function Tests: No results for input(s): TSH, T4TOTAL, FREET4, T3FREE, THYROIDAB in the last 72 hours. Anemia Panel: No results for input(s): VITAMINB12, FOLATE, FERRITIN, TIBC, IRON, RETICCTPCT in the last 72 hours. Sepsis Labs: Recent Labs  Lab 06/09/21 0350 06/09/21 0910 06/09/21 1146 06/10/21 0157 06/10/21 2100  PROCALCITON <0.10  --   --  0.13  --   LATICACIDVEN  --  1.9 2.8*  --  1.5    Recent Results (from the past 240 hour(s))  Culture, Respiratory w Gram Stain     Status: None   Collection Time: 06/09/21  9:28 AM   Specimen: Tracheal Aspirate; Respiratory  Result Value Ref Range Status   Specimen Description TRACHEAL ASPIRATE  Final   Special Requests NONE  Final   Gram Stain   Final    FEW SQUAMOUS  EPITHELIAL CELLS PRESENT FEW WBC SEEN ABUNDANT GRAM POSITIVE COCCI    Culture   Final    RARE KLEBSIELLA PNEUMONIAE ABUNDANT STAPHYLOCOCCUS AUREUS Confirmed Extended Spectrum Beta-Lactamase Producer (ESBL).  In bloodstream infections from ESBL organisms, carbapenems are preferred over piperacillin/tazobactam. They are shown to have a lower risk of mortality. FOR KLEBSIELLA PNEUMONIAE Performed at [facility], [place] [address], [place], [place] [address]    Report Status 06/11/2021 FINAL  Final   Organism ID, Bacteria KLEBSIELLA PNEUMONIAE  Final   Organism ID, Bacteria STAPHYLOCOCCUS AUREUS  Final      Susceptibility   Klebsiella pneumoniae - MIC*    AMPICILLIN >=32 RESISTANT Resistant     CEFAZOLIN >=64 RESISTANT Resistant     CEFEPIME >=32 RESISTANT Resistant  CEFTAZIDIME RESISTANT Resistant     CEFTRIAXONE >=64 RESISTANT Resistant     CIPROFLOXACIN >=4 RESISTANT Resistant     GENTAMICIN >=16 RESISTANT Resistant     IMIPENEM <=0.25 SENSITIVE Sensitive     TRIMETH/SULFA >=320 RESISTANT Resistant     AMPICILLIN/SULBACTAM >=32 RESISTANT Resistant     PIP/TAZO 16 SENSITIVE Sensitive     * RARE KLEBSIELLA PNEUMONIAE   Staphylococcus aureus - MIC*    CIPROFLOXACIN <=0.5 SENSITIVE Sensitive     ERYTHROMYCIN <=0.25 SENSITIVE Sensitive     GENTAMICIN <=0.5 SENSITIVE Sensitive     OXACILLIN 0.5 SENSITIVE Sensitive     TETRACYCLINE <=1 SENSITIVE Sensitive     VANCOMYCIN <=0.5 SENSITIVE Sensitive     TRIMETH/SULFA <=10 SENSITIVE Sensitive     CLINDAMYCIN <=0.25 SENSITIVE Sensitive     RIFAMPIN <=0.5 SENSITIVE Sensitive     Inducible Clindamycin NEGATIVE Sensitive     * ABUNDANT STAPHYLOCOCCUS AUREUS  Culture, blood (routine x 2)     Status: None (Preliminary result)   Collection Time: 06/10/21  9:20 AM   Specimen: BLOOD  Result Value Ref Range Status   Specimen Description BLOOD RIGHT ANTECUBITAL  Final   Special Requests   Final    BOTTLES DRAWN AEROBIC AND ANAEROBIC  Blood Culture adequate volume   Culture   Final    NO GROWTH < 24 HOURS Performed at [facility], [address] [place], [place] [address]    Report Status PENDING  Incomplete  Culture, blood (routine x 2)     Status: None (Preliminary result)   Collection Time: 06/10/21  9:20 AM   Specimen: BLOOD LEFT HAND  Result Value Ref Range Status   Specimen Description BLOOD LEFT HAND  Final   Special Requests   Final    BOTTLES DRAWN AEROBIC AND ANAEROBIC Blood Culture adequate volume   Culture   Final    NO GROWTH < 24 HOURS Performed at [facility], [place] [address], [place], [place] [address]    Report Status PENDING  Incomplete       Imaging Studies   CT Angio Chest Pulmonary Embolism (PE) W or WO Contrast  Result Date: 06/10/2021 CLINICAL DATA:  PE suspected, high prob EXAM: CT ANGIOGRAPHY CHEST WITH CONTRAST TECHNIQUE: Multidetector CT imaging of the chest was performed using the standard protocol during bolus administration of intravenous contrast. Multiplanar CT image reconstructions and MIPs were obtained to evaluate the vascular anatomy. CONTRAST:  67m OMNIPAQUE IOHEXOL 350 MG/ML SOLN COMPARISON:  04/29/2021 FINDINGS: Cardiovascular: Contrast injection is sufficient to demonstrate satisfactory opacification of the pulmonary arteries to the segmental level. There is no pulmonary embolus or evidence of right heart strain. The size of the main pulmonary artery is normal. Heart size is normal, with no pericardial effusion. The course and caliber of the aorta are normal. There is no atherosclerotic calcification. No acute aortic syndrome. Mediastinum/Nodes: Nodular focus of the upper mediastinum abutting the proximal left subclavian artery is unchanged and measures 2.7 x 2.1 cm (series 5, image 31). No axillary lymphadenopathy. Lungs/Pleura: Tracheostomy tube tip is at the level of the clavicular heads. Widespread bilateral ground-glass opacity is improved since prior study. No  pleural effusion. Bilateral apical predominant emphysema. Upper Abdomen: Contrast bolus timing is not optimized for evaluation of the abdominal organs. The visualized portions of the organs of the upper abdomen are normal. Musculoskeletal: No chest wall abnormality. No bony spinal canal stenosis. Review of the MIP images confirms the above findings. IMPRESSION:  1. No pulmonary embolus or acute aortic syndrome. 2. Improved bilateral ground-glass opacity, likely improving pulmonary edema or infection. 3. Unchanged appearance of nodular focus of the upper mediastinum abutting the proximal left subclavian artery, possibly an enlarged lymph node. Follow-up chest CT recommended in 6-12 weeks following resolution of the current acute condition. Emphysema (ICD10-J43.9). Electronically Signed   By: Ulyses Jarred M.D.   On: 06/10/2021 03:09     Medications   Scheduled Meds:  arformoterol  15 mcg Nebulization BID   atorvastatin  40 mg Per Tube QHS   budesonide (PULMICORT) nebulizer solution  0.5 mg Nebulization BID   chlorhexidine gluconate (MEDLINE KIT)  15 mL Mouth Rinse BID   Chlorhexidine Gluconate Cloth  6 each Topical Q0600   clonazePAM  0.5 mg Per Tube Q6H   cyclobenzaprine  5 mg Per Tube Q8H   docusate  200 mg Per Tube BID   feeding supplement (PROSource TF)  45 mL Per Tube BID   heparin  5,000 Units Subcutaneous Q8H   levETIRAcetam  500 mg Per Tube BID   mouth rinse  15 mL Mouth Rinse 10 times per day   metoprolol tartrate  75 mg Per Tube Q8H   oxyCODONE  5 mg Per Tube Q6H   pantoprazole sodium  40 mg Per Tube QHS   polyethylene glycol  17 g Per Tube Daily   revefenacin  175 mcg Nebulization Daily   senna  1 tablet Per Tube Daily   Continuous Infusions:  sodium chloride Stopped (06/03/21 1725)   feeding supplement (JEVITY 1.2 CAL) 1,000 mL (06/11/21 0725)   meropenem (MERREM) IV 1 g (06/11/21 0931)       LOS: 43 days      Alma Friendly, MD [facility]  06/11/2021,  2:43 PM

## 2021-06-11 NOTE — TOC Progression Note (Signed)
Transition of Care [facility]) - Progression Note    Patient Details  Name: Vincent [name] MRN: [id number] Date of Birth: 06-14-1971  [facility] CM/SW Contact  Terrial Rhodes, LCSWA Phone Number: 06/11/2021, 4:58 PM  Clinical Narrative:     No current SNF bed offers for patient. CSW spoke with [employer] who confirmed CSW can fax over referral for review. CSW faxed over referral for possible SNF placement for review. CSW will continue to follow and assist with dc planning needs.  Expected Discharge Plan: Skilled Nursing Facility (Resides with sister ( W/C bound)) Barriers to Discharge: Vent Bed not available  Expected Discharge Plan and Services Expected Discharge Plan: Skilled Nursing Facility (Resides with sister ( W/C bound))   Discharge Planning Services: CM Consult                                           Social Determinants of Health (SDOH) Interventions    Readmission Risk Interventions No flowsheet data found.

## 2021-06-11 NOTE — TOC Progression Note (Signed)
Transition of Care [facility]) - Progression Note    Patient Details  Name: Lonnell Maleak Brazzel MRN: [id number] Date of Birth: June 03, 1971  Transition of Care [facility]) CM/SW Contact  Terrial Rhodes, LCSWA Phone Number: 06/11/2021, 2:43 PM  Clinical Narrative:     CSW spoke with Clydie Braun with [facility] who confirmed they do not take managed medicaid. Patient has no current SNF bed offers. CSW will continue to follow and assist with dc planning needs.  Expected Discharge Plan: Skilled Nursing Facility (Resides with sister ( W/C bound)) Barriers to Discharge: Vent Bed not available  Expected Discharge Plan and Services Expected Discharge Plan: Skilled Nursing Facility (Resides with sister ( W/C bound))   Discharge Planning Services: CM Consult                                           Social Determinants of Health (SDOH) Interventions    Readmission Risk Interventions No flowsheet data found.

## 2021-06-11 NOTE — Progress Notes (Signed)
Notified by RN that pt with HR of 130-140 and BP elevated to 170/110's.  Give metoprolol 5 mg IV now.  Dilaudid provided to pt now as well. Continue to monitor

## 2021-06-12 ENCOUNTER — Inpatient Hospital Stay (HOSPITAL_COMMUNITY): Payer: Medicaid Other

## 2021-06-12 DIAGNOSIS — R569 Unspecified convulsions: Secondary | ICD-10-CM | POA: Diagnosis not present

## 2021-06-12 DIAGNOSIS — G931 Anoxic brain damage, not elsewhere classified: Secondary | ICD-10-CM | POA: Diagnosis not present

## 2021-06-12 DIAGNOSIS — I469 Cardiac arrest, cause unspecified: Secondary | ICD-10-CM | POA: Diagnosis not present

## 2021-06-12 DIAGNOSIS — Z930 Tracheostomy status: Secondary | ICD-10-CM | POA: Diagnosis not present

## 2021-06-12 DIAGNOSIS — J9601 Acute respiratory failure with hypoxia: Secondary | ICD-10-CM | POA: Diagnosis not present

## 2021-06-12 DIAGNOSIS — N179 Acute kidney failure, unspecified: Secondary | ICD-10-CM | POA: Diagnosis not present

## 2021-06-12 LAB — BASIC METABOLIC PANEL
Anion gap: 12 (ref 5–15)
BUN: 53 mg/dL — ABNORMAL HIGH (ref 6–20)
CO2: 23 mmol/L (ref 22–32)
Calcium: 9.8 mg/dL (ref 8.9–10.3)
Chloride: 113 mmol/L — ABNORMAL HIGH (ref 98–111)
Creatinine, Ser: 1.45 mg/dL — ABNORMAL HIGH (ref 0.61–1.24)
GFR, Estimated: 58 mL/min — ABNORMAL LOW (ref >60)
Glucose, Bld: 215 mg/dL — ABNORMAL HIGH (ref 70–99)
Potassium: 3.5 mmol/L (ref 3.5–5.1)
Sodium: 148 mmol/L — ABNORMAL HIGH (ref 135–145)

## 2021-06-12 LAB — CBC WITH DIFFERENTIAL/PLATELET
Abs Immature Granulocytes: 0.71 10*3/uL — ABNORMAL HIGH (ref 0.00–0.07)
Basophils Absolute: 0.1 10*3/uL (ref 0.0–0.1)
Basophils Relative: 0 %
Eosinophils Absolute: 0 10*3/uL (ref 0.0–0.5)
Eosinophils Relative: 0 %
HCT: 43.1 % (ref 39.0–52.0)
Hemoglobin: 13.6 g/dL (ref 13.0–17.0)
Immature Granulocytes: 3 %
Lymphocytes Relative: 17 %
Lymphs Abs: 4.8 10*3/uL — ABNORMAL HIGH (ref 0.7–4.0)
MCH: 28.6 pg (ref 26.0–34.0)
MCHC: 31.6 g/dL (ref 30.0–36.0)
MCV: 90.7 fL (ref 80.0–100.0)
Monocytes Absolute: 2.5 10*3/uL — ABNORMAL HIGH (ref 0.1–1.0)
Monocytes Relative: 9 %
Neutro Abs: 20 10*3/uL — ABNORMAL HIGH (ref 1.7–7.7)
Neutrophils Relative %: 71 %
Platelets: 378 10*3/uL (ref 150–400)
RBC: 4.75 MIL/uL (ref 4.22–5.81)
RDW: 15.6 % — ABNORMAL HIGH (ref 11.5–15.5)
WBC: 28.1 10*3/uL — ABNORMAL HIGH (ref 4.0–10.5)
nRBC: 0.5 % — ABNORMAL HIGH (ref 0.0–0.2)

## 2021-06-12 LAB — LACTIC ACID, PLASMA
Lactic Acid, Venous: 1.3 mmol/L (ref 0.5–1.9)
Lactic Acid, Venous: 1.4 mmol/L (ref 0.5–1.9)

## 2021-06-12 LAB — GLUCOSE, CAPILLARY
Glucose-Capillary: 149 mg/dL — ABNORMAL HIGH (ref 70–99)
Glucose-Capillary: 153 mg/dL — ABNORMAL HIGH (ref 70–99)
Glucose-Capillary: 161 mg/dL — ABNORMAL HIGH (ref 70–99)
Glucose-Capillary: 183 mg/dL — ABNORMAL HIGH (ref 70–99)
Glucose-Capillary: 210 mg/dL — ABNORMAL HIGH (ref 70–99)

## 2021-06-12 MED ORDER — CHLORHEXIDINE GLUCONATE 0.12 % MT SOLN
15.0000 mL | Freq: Two times a day (BID) | OROMUCOSAL | Status: DC
  Administered 2021-06-12 – 2021-07-22 (×81): 15 mL via OROMUCOSAL
  Filled 2021-06-12 (×80): qty 15

## 2021-06-12 MED ORDER — ACETAMINOPHEN 650 MG RE SUPP
650.0000 mg | Freq: Four times a day (QID) | RECTAL | Status: DC | PRN
  Administered 2021-06-12 – 2021-06-30 (×2): 650 mg via RECTAL
  Filled 2021-06-12 (×3): qty 1

## 2021-06-12 MED ORDER — METOPROLOL TARTRATE 5 MG/5ML IV SOLN
2.5000 mg | Freq: Three times a day (TID) | INTRAVENOUS | Status: DC
  Administered 2021-06-12 – 2021-06-13 (×3): 2.5 mg via INTRAVENOUS
  Filled 2021-06-12 (×3): qty 5

## 2021-06-12 MED ORDER — PANTOPRAZOLE 2 MG/ML SUSPENSION
40.0000 mg | Freq: Every day | ORAL | Status: DC
  Administered 2021-06-17 – 2021-07-21 (×35): 40 mg
  Filled 2021-06-12 (×44): qty 20

## 2021-06-12 MED ORDER — ORAL CARE MOUTH RINSE
15.0000 mL | Freq: Two times a day (BID) | OROMUCOSAL | Status: DC
  Administered 2021-06-12 – 2021-07-22 (×75): 15 mL via OROMUCOSAL

## 2021-06-12 MED ORDER — LEVETIRACETAM IN NACL 500 MG/100ML IV SOLN
500.0000 mg | Freq: Two times a day (BID) | INTRAVENOUS | Status: DC
  Administered 2021-06-12 – 2021-06-19 (×14): 500 mg via INTRAVENOUS
  Filled 2021-06-12 (×15): qty 100

## 2021-06-12 MED ORDER — FENTANYL CITRATE PF 50 MCG/ML IJ SOSY
50.0000 ug | PREFILLED_SYRINGE | Freq: Once | INTRAMUSCULAR | Status: AC
  Administered 2021-06-12: 50 ug via INTRAVENOUS
  Filled 2021-06-12: qty 1

## 2021-06-12 NOTE — Progress Notes (Signed)
Pt's temp was 103.3 axillary. Tylenol given at 0410. Temp 103.1 axillary when rechecked. Notified MD

## 2021-06-12 NOTE — Progress Notes (Signed)
NAME:  Vincent [name], MRN:  [id number], DOB:  [birth year], LOS: 44 ADMISSION DATE:  04/29/2021, CONSULTATION DATE:  04/29/21 REFERRING MD:  Dr. Jacqulyn Bath, CHIEF COMPLAINT:  Found Down   History of present illness   50 y/o male smoker admitted with PEA cardiac arrest with ROSC after 6 minutes.  CT head showed old infarct. CT chest showed b/l dense lower lobe consolidation with changes of emphysema. UDS positive for benzodiazepines and THC.  Past Medical History  Emphysema, CVA, DM type 2  Significant Hospital Events   8/2 PEA Arrest, [place] airway in the field, CPR x6 minutes and epi x1, ROSC, Intubated in ED, Admitted to PCCM  8/4 Weaned off TTM, [place] O2 sat of 47%, started on milrinone and lasix 8/5 milrinone increased 8/6 TF stopped, OG placed to suction for ileus 8/8 Post pyloric Cortrak placed, myoclonic jerking, started Keppra, depakote 8/10 Completed antibiotic course for pneumonia. Family meeting transitioned to DNR. 8/15 Tracheostomy  8/18 Febrile overnight, started Zosyn for aspiration pneumonia 8/22 Completed Abx course for aspiration pneumonia  8/24 PEG placed 8/25 ABX started for fevers and ? Free air in abdomen. CT non-acute.  8/31 PSV wean 2 hours 9/1 Stopped lasix, farxiga, losartan, spiro with AKI. Ongoing jerking, diaphoresis. WBC increased, cultured / abx started empirically.  PCT increased. CK 2,450 9/2 start scheduled opiates for possible opiate withdrawal symptoms 9/4 add flexeril, on PSV.  Versed x1 overnight. 9/9 has tolerated trach collar for 48+hours  Interim history/subjective:   Patient febrile with Tmax 103.3 in last 24 hours. CT Abdomen today shows air fluid distension of the colon, greatest at the cecum and extending to the level of the sigmoid colon. No free air or evidence of vascular compromise. Lactic acid 1.3 this morning.  Objective   Blood pressure 127/89, pulse (!) 121, temperature 99.5 F (37.5 C), temperature source Axillary, resp. rate (!) 34,  height 5\' 8"  (1.727 m), weight 63.2 kg, SpO2 100 %.    FiO2 (%):  [28 %] 28 %   Intake/Output Summary (Last 24 hours) at 06/12/2021 1257 Last data filed at 06/12/2021 0745 Gross per 24 hour  Intake 1914.56 ml  Output 950 ml  Net 964.56 ml   Filed Weights   06/03/21 0500 06/04/21 0500 06/08/21 0353  Weight: 68.3 kg 68.4 kg 63.2 kg   Exam: General:  Adult male lying in bed in NAD HEENT: MM pink/moist, pupils 4/reactive, midline trach site wnl Neuro: alert, does not follow commands CV: rrr, no murmur PULM:  diminished but clear to auscultation, no wheezing GI: soft, non-tender, +distended, bowel sounds present, peg tube in place Extremities: warm/dry, no LE edema, prevalon boots in place Skin: no rashes  Resolved Hospital Problem list   Acute Kidney Injury from ATN 2nd to Cardiogenic shock, Aspiration Pneumonia, Hyponatremia, Oral candidiasis  Assessment & Plan:   Fever, leukocytosis with bowel distension  - Possible colitis like infection vs early large bowel obstruction - management per primary team - Consider c. 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 Intermittent tachycardia and diaphoresis?  Related to fever versus neurologic - continue klonopin 0.5 mg q 6hrs, flexeril 5mg  q 8hrs,  and keppra 500mg  BID - prn versed for abnormal motor movements  Opiate withdrawal. -continue Oxy IR 5mg  q 6hrs with scheduled bowel regimen and prn's  Acute Systolic/Diastolic CHF after Cardiac Arrest. HLD. -continue lopressor 50mg  q 8hr, liptor   Acute Hypoxic Respiratory Failure from Cardiac Arrest and Aspiration  Pneumonia. tracheostomy. Centrilobular Emphysema. -Continue trach collar as tolerated - continue yupelri, brovana, pulmicort, PRN albuterol  - ongoing trach care -Completed treatment for exacerbation with steroids and azithromycin 9/8-9/12  Anemia of critical illness -trend CBC   Dysphagia - TF per nutrition  - PEG care per protocol   LUL Nodular Density 2.6 x 2.1 cm abutting pleura. Seen on CT chest 04/29/21, unchanged 9/15 CTA - will need follow up imaging in ~3 months depending on neuro status  Disposition. - Limited options due to insurance coverage -TOC still assessing potential discharge plan, he has been on trach collar for 3 days, hopefully this makes placement easier.  PCCM to follow again next week for trach care  [birth year]1986, MD [facility] Office: [phone]   See Amion for personal pager PCCM on call pager [phone] until 7pm. Please call Elink 7p-7a. [phone]

## 2021-06-12 NOTE — Progress Notes (Signed)
Secure chat with Dr. Sharolyn Douglas regarding potential ileus vs bowel obstruction, new orders received to hold all meds per tube and to stop tube feeds.

## 2021-06-12 NOTE — TOC Progression Note (Signed)
Transition of Care [facility]) - Progression Note    Patient Details  Name: Raiyan Charvis Lightner MRN: [id number] Date of Birth: [birth year]  Transition of Care [facility]) CM/SW Contact  Terrial Rhodes, LCSWA Phone Number: 06/12/2021, 2:58 PM  Clinical Narrative:     CSW called [facility] to follow up on referral status. CSW left voicemail for [facility] in admissions. CSW awaiting callback. CSW will continue to follow and assist with dc planning needs.  Expected Discharge Plan: Skilled Nursing Facility (Resides with sister ( W/C bound)) Barriers to Discharge: Vent Bed not available  Expected Discharge Plan and Services Expected Discharge Plan: Skilled Nursing Facility (Resides with sister ( W/C bound))   Discharge Planning Services: CM Consult                                           Social Determinants of Health (SDOH) Interventions    Readmission Risk Interventions No flowsheet data found.

## 2021-06-12 NOTE — Progress Notes (Signed)
PROGRESS NOTE    Vincent [name]   JYN:[id number]  DOB: [birth year]  PCP: Pcp, No    DOA: 04/29/2021 LOS: 44    Brief Narrative / Hospital Course to Date:   50 yo male smoker who was admitted to ICU after PEA cardiac arrest with ROSC after 6 minutes of resuscitation.  CT head showed an old infarct, nothing acute.  CT chest with bilateral dense lower lobe consolidations with emphysematous changes.   Complicated hospital course as follows, per PCCM. TRH assumed care 06/07/21.   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 Continue Klonopin, Flexeril, Keppra once able Midazolam PRN abnormal muscle movements  Acute respiratory failure with hypoxia due to Cardiac Arrest and Aspiration Pneumonia Failure to wean from ventilator s/p tracheostomy Emphysema, centrilobular PCCM continues to follow for trach management Supplement O2 to maintain sat >90% Continue Yupelri, Brovana, Pulmnicort, PRN albuterol Completed 5 day course Prednisone and 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 Follow up imaging in ~3 months if patient survives hospital course  Hyperlipidemia Lipitor  Goals of care discussion Patient with persistent encephalopathy, guarded prognosis, now noted to be septic and rapidly declining Palliative care reconsulted for further discussions of goals of care DNR    Patient BMI: Body mass index is 21.2 kg/m.   DVT prophylaxis: heparin injection 5,000 Units Start: 04/29/21 2230 SCDs Start: 04/29/21 2227   Diet:     Code Status: DNR   Subjective 06/12/21   Patient continues to decline, still febrile, tachycardic.    Disposition Plan & Communication   Status is: Inpatient  Remains inpatient appropriate because:Inpatient level of care appropriate due to severity of illness.  Requires placement, limited options due to insurance and trach/peg status.    Dispo: The patient is from: Home              Anticipated d/c is to: SNF vs LTAC?              Patient currently is not medically stable to d/c.  Difficult to place patient Yes    Consults, Procedures, Significant Events   Consultants:  PCCM  Procedures:  As above "significant events"  Antimicrobials:  Anti-infectives (From admission, onward)    Start     Dose/Rate Route Frequency Ordered Stop   06/10/21 1000  meropenem (MERREM) 1 g in  sodium chloride 0.9 % 100 mL IVPB        1 g 200 mL/hr over 30 Minutes Intravenous Every 8 hours 06/10/21 0900     06/09/21 1045  piperacillin-tazobactam (ZOSYN) IVPB 3.375 g  Status:  Discontinued        3.375 g 12.5 mL/hr over 240 Minutes Intravenous Every 8 hours 06/09/21 0959 06/10/21 0840   06/06/21 1000  azithromycin (ZITHROMAX) tablet 250 mg  Status:  Discontinued       See Hyperspace for full Linked Orders Report.   250 mg Oral Daily 06/05/21 1014 06/05/21 1629   06/06/21 1000  azithromycin (ZITHROMAX) tablet 250 mg       See Hyperspace for full Linked Orders Report.   250 mg Per Tube Daily 06/05/21 1629 06/09/21 1007   06/05/21 1100  azithromycin (ZITHROMAX) tablet 500 mg       See Hyperspace for full Linked Orders Report.   500 mg Oral  Once 06/05/21 1014 06/05/21 1124   05/30/21 1530  vancomycin (VANCOREADY) IVPB 1250 mg/250 mL  Status:  Discontinued        1,250 mg 166.7 mL/hr over 90 Minutes Intravenous Every 24 hours 05/29/21 1504 05/30/21 1028   05/30/21 0100  ceFEPIme (MAXIPIME) 2 g in sodium chloride 0.9 % 100 mL IVPB  Status:  Discontinued        2 g 200 mL/hr over 30 Minutes Intravenous Every 12 hours 05/29/21 1504 05/30/21 1028   05/29/21 1430  ceFEPIme (MAXIPIME) 2 g in sodium chloride 0.9 % 100 mL IVPB        2 g 200 mL/hr over 30 Minutes Intravenous  Once 05/29/21 1331 05/29/21 1614   05/29/21 1430  vancomycin (VANCOREADY) IVPB 1500 mg/300 mL        1,500 mg 150 mL/hr over 120 Minutes Intravenous  Once 05/29/21 1331 05/29/21 1814   05/23/21 1000  fluconazole (DIFLUCAN) tablet 100 mg       See Hyperspace for full Linked Orders Report.   100 mg Per Tube Daily 05/22/21 1043 05/31/21 0940   05/22/21 2000  piperacillin-tazobactam (ZOSYN) IVPB 3.375 g  Status:  Discontinued        3.375 g 12.5 mL/hr over 240 Minutes Intravenous Every 8 hours 05/22/21 1227 05/23/21 1017   05/22/21 1315  piperacillin-tazobactam (ZOSYN) IVPB 3.375 g        3.375 g 100 mL/hr over 30  Minutes Intravenous  Once 05/22/21 1227 05/22/21 1404   05/22/21 1130  fluconazole (DIFLUCAN) tablet 100 mg  Status:  Discontinued        100 mg Per Tube Daily 05/22/21 1035 05/22/21 1042   05/22/21 1130  fluconazole (DIFLUCAN) tablet 200 mg       See Hyperspace for full Linked Orders Report.   200 mg Per Tube  Once 05/22/21 1043 05/22/21 1054   05/21/21 1112  ceFAZolin (ANCEF) IVPB 1 g/50 mL premix        over 30 Minutes  Continuous PRN 05/21/21 1120 05/21/21 1148   05/21/21 0600  ceFAZolin (ANCEF) IVPB 2g/100 mL premix        2 g 200 mL/hr over 30 Minutes Intravenous To Radiology 05/20/21  1318 05/21/21 1142   05/16/21 1800  Ampicillin-Sulbactam (UNASYN) 3 g in sodium chloride 0.9 % 100 mL IVPB  Status:  Discontinued        3 g 200 mL/hr over 30 Minutes Intravenous Every 6 hours 05/16/21 1107 05/19/21 1014   05/15/21 1100  piperacillin-tazobactam (ZOSYN) IVPB 3.375 g  Status:  Discontinued        3.375 g 12.5 mL/hr over 240 Minutes Intravenous Every 8 hours 05/15/21 1000 05/16/21 1107   05/05/21 1400  amoxicillin-clavulanate (AUGMENTIN) 875-125 MG per tablet 1 tablet        1 tablet Per Tube Every 12 hours 05/05/21 1358 05/06/21 1453   04/30/21 2000  ceFEPIme (MAXIPIME) 2 g in sodium chloride 0.9 % 100 mL IVPB  Status:  Discontinued        2 g 200 mL/hr over 30 Minutes Intravenous Every 12 hours 04/30/21 0814 04/30/21 0922   04/30/21 2000  cefTRIAXone (ROCEPHIN) 2 g in sodium chloride 0.9 % 100 mL IVPB  Status:  Discontinued        2 g 200 mL/hr over 30 Minutes Intravenous Every 24 hours 04/30/21 0922 05/05/21 1358   04/30/21 1015  azithromycin (ZITHROMAX) 500 mg in sodium chloride 0.9 % 250 mL IVPB  Status:  Discontinued        500 mg 250 mL/hr over 60 Minutes Intravenous Every 24 hours 04/30/21 0922 05/01/21 0925   04/30/21 0800  ceFEPIme (MAXIPIME) 2 g in sodium chloride 0.9 % 100 mL IVPB  Status:  Discontinued        2 g 200 mL/hr over 30 Minutes Intravenous Every 8 hours 04/29/21  2239 04/30/21 0814   04/29/21 2359  vancomycin (VANCOREADY) IVPB 1500 mg/300 mL  Status:  Discontinued        1,500 mg 150 mL/hr over 120 Minutes Intravenous Daily at bedtime 04/29/21 2241 04/30/21 0922   04/29/21 2200  ceFEPIme (MAXIPIME) 2 g in sodium chloride 0.9 % 100 mL IVPB        2 g 200 mL/hr over 30 Minutes Intravenous  Once 04/29/21 2151 04/30/21 0048   04/29/21 2200  azithromycin (ZITHROMAX) 500 mg in sodium chloride 0.9 % 250 mL IVPB        500 mg 250 mL/hr over 60 Minutes Intravenous  Once 04/29/21 2151 04/30/21 0256         Micro    Objective   Vitals:   06/12/21 1139 06/12/21 1328 06/12/21 1500 06/12/21 1703  BP: 127/89   (!) 139/99  Pulse: (!) 121  (!) 126 (!) 126  Resp: (!) 34  (!) 28 (!) 28  Temp: 99.5 F (37.5 C) (!) 102.2 F (39 C)  98.9 F (37.2 C)  TempSrc: Axillary Axillary  Axillary  SpO2: 100%  100% 100%  Weight:      Height:        Intake/Output Summary (Last 24 hours) at 06/12/2021 1738 Last data filed at 06/12/2021 1700 Gross per 24 hour  Intake 1914.56 ml  Output 1300 ml  Net 614.56 ml   Filed Weights   06/03/21 0500 06/04/21 0500 06/08/21 0353  Weight: 68.3 kg 68.4 kg 63.2 kg    Physical Exam: General: Critically ill-appearing, unable to follow commands, unresponsive, appears to be awake Cardiovascular: S1, S2 present Respiratory: Diminished breath sounds bilaterally, increased work of breathing Abdomen: Soft, ??tender, +++distended, bowel sounds present, PEG in place Musculoskeletal: No bilateral pedal edema noted, offloading boots on bilateral feet Skin: Normal Psychiatry: Unable to assess  Labs   Data Reviewed: I have personally reviewed following labs and imaging studies  CBC: Recent Labs  Lab 06/08/21 0102 06/09/21 0350 06/10/21 0157 06/11/21 0242 06/12/21 0343  WBC 22.2* 24.4* 25.9* 28.2* 28.1*  NEUTROABS  --   --   --   --  20.0*  HGB 13.2 14.4 13.6 14.0 13.6  HCT 40.4 44.8 41.9 43.6 43.1  MCV 86.7 88.0  88.0 89.7 90.7  PLT 475* 513* 474* 383 378   Basic Metabolic Panel: Recent Labs  Lab 06/08/21 0102 06/09/21 0350 06/10/21 0157 06/11/21 0242 06/12/21 0343  NA 135 138 141 144 148*  K 4.6 5.1 4.5 4.7 3.5  CL 94* 97* 100 103 113*  CO2 GLUCOSE 106* 144* 140* 131* 215*  BUN 36* 44* 51* 52* 53*  CREATININE 0.82 1.18 1.32* 1.46* 1.45*  CALCIUM 10.6* 10.9* 10.9* 10.5* 9.8  MG 2.2  --   --   --   --   PHOS 5.8*  --   --   --   --    GFR: Estimated Creatinine Clearance: 53.9 mL/min (A) (by C-G formula based on SCr of 1.45 mg/dL (H)). Liver Function Tests: Recent Labs  Lab 06/10/21 0157  AST 54*  ALT 98*  ALKPHOS 61  BILITOT 0.6  PROT 8.7*  ALBUMIN 4.0   No results for input(s): LIPASE, AMYLASE in the last 168 hours. No results for input(s): AMMONIA in the last 168 hours. Coagulation Profile: No results for input(s): INR, PROTIME in the last 168 hours. Cardiac Enzymes: Recent Labs  Lab 06/09/21 0910  CKTOTAL 183   BNP (last 3 results) No results for input(s): PROBNP in the last 8760 hours. HbA1C: No results for input(s): HGBA1C in the last 72 hours. CBG: Recent Labs  Lab 06/11/21 2007 06/12/21 0010 06/12/21 0859 06/12/21 1137 06/12/21 1701  GLUCAP 159* 210* 183* 153* 149*   Lipid Profile: No results for input(s): CHOL, HDL, LDLCALC, TRIG, CHOLHDL, LDLDIRECT in the last 72 hours. Thyroid Function Tests: No results for input(s): TSH, T4TOTAL, FREET4, T3FREE, THYROIDAB in the last 72 hours. Anemia Panel: No results for input(s): VITAMINB12, FOLATE, FERRITIN, TIBC, IRON, RETICCTPCT in the last 72 hours. Sepsis Labs: Recent Labs  Lab 06/09/21 0350 06/09/21 0910 06/09/21 1146 06/10/21 0157 06/10/21 2100 06/12/21 1101 06/12/21 1409  PROCALCITON <0.10  --   --  0.13  --   --   --   LATICACIDVEN  --    < > 2.8*  --  1.5 1.3 1.4   < > = values in this interval not displayed.    Recent Results (from the past 240 hour(s))  Culture,  Respiratory w Gram Stain     Status: None   Collection Time: 06/09/21  9:28 AM   Specimen: Tracheal Aspirate; Respiratory  Result Value Ref Range Status   Specimen Description TRACHEAL ASPIRATE  Final   Special Requests NONE  Final   Gram Stain   Final    FEW SQUAMOUS EPITHELIAL CELLS PRESENT FEW WBC SEEN ABUNDANT GRAM POSITIVE COCCI    Culture   Final    RARE KLEBSIELLA PNEUMONIAE ABUNDANT STAPHYLOCOCCUS AUREUS Confirmed Extended Spectrum Beta-Lactamase Producer (ESBL).  In bloodstream infections from ESBL organisms, carbapenems are preferred over piperacillin/tazobactam. They are shown to have a lower risk of mortality. FOR KLEBSIELLA PNEUMONIAE Performed at [facility], [address] [place], [place] [address]    Report Status 06/11/2021 FINAL  Final   Organism ID,  Bacteria KLEBSIELLA PNEUMONIAE  Final   Organism ID, Bacteria STAPHYLOCOCCUS AUREUS  Final      Susceptibility   Klebsiella pneumoniae - MIC*    AMPICILLIN >=32 RESISTANT Resistant     CEFAZOLIN >=64 RESISTANT Resistant     CEFEPIME >=32 RESISTANT Resistant     CEFTAZIDIME RESISTANT Resistant     CEFTRIAXONE >=64 RESISTANT Resistant     CIPROFLOXACIN >=4 RESISTANT Resistant     GENTAMICIN >=16 RESISTANT Resistant     IMIPENEM <=0.25 SENSITIVE Sensitive     TRIMETH/SULFA >=320 RESISTANT Resistant     AMPICILLIN/SULBACTAM >=32 RESISTANT Resistant     PIP/TAZO 16 SENSITIVE Sensitive     * RARE KLEBSIELLA PNEUMONIAE   Staphylococcus aureus - MIC*    CIPROFLOXACIN <=0.5 SENSITIVE Sensitive     ERYTHROMYCIN <=0.25 SENSITIVE Sensitive     GENTAMICIN <=0.5 SENSITIVE Sensitive     OXACILLIN 0.5 SENSITIVE Sensitive     TETRACYCLINE <=1 SENSITIVE Sensitive     VANCOMYCIN <=0.5 SENSITIVE Sensitive     TRIMETH/SULFA <=10 SENSITIVE Sensitive     CLINDAMYCIN <=0.25 SENSITIVE Sensitive     RIFAMPIN <=0.5 SENSITIVE Sensitive     Inducible Clindamycin NEGATIVE Sensitive     * ABUNDANT STAPHYLOCOCCUS AUREUS   Culture, blood (routine x 2)     Status: None (Preliminary result)   Collection Time: 06/10/21  9:20 AM   Specimen: BLOOD  Result Value Ref Range Status   Specimen Description BLOOD RIGHT ANTECUBITAL  Final   Special Requests   Final    BOTTLES DRAWN AEROBIC AND ANAEROBIC Blood Culture adequate volume   Culture   Final    NO GROWTH 2 DAYS Performed at [facility], [address], [place], [place] [address]    Report Status PENDING  Incomplete  Culture, blood (routine x 2)     Status: None (Preliminary result)   Collection Time: 06/10/21  9:20 AM   Specimen: BLOOD LEFT HAND  Result Value Ref Range Status   Specimen Description BLOOD LEFT HAND  Final   Special Requests   Final    BOTTLES DRAWN AEROBIC AND ANAEROBIC Blood Culture adequate volume   Culture   Final    NO GROWTH 2 DAYS Performed at [facility], [address] [place], [place] [address]    Report Status PENDING  Incomplete       Imaging Studies   CT ABDOMEN PELVIS WO CONTRAST  Result Date: 06/12/2021 CLINICAL DATA:  Abdominal abscess/infection. EXAM: CT ABDOMEN AND PELVIS WITHOUT CONTRAST TECHNIQUE: Multidetector CT imaging of the abdomen and pelvis was performed following the standard protocol without IV contrast. COMPARISON:  CT abdomen pelvis, 05/22/2021 and 05/13/2021. KUB, 06/11/2021. CTA chest, 06/09/2021. FINDINGS: Lower chest: Cardiomegaly. Mild ascending aortic calcifications. Interval resolution of previously small volume pericardial effusion. Hepatobiliary: Normal noncontrast appearance. No gallstones, gallbladder wall thickening, or biliary dilatation. Pancreas: Unremarkable. No pancreatic ductal dilatation or surrounding inflammatory changes. Spleen: Normal in size without focal abnormality. Adrenals/Urinary Tract: Adrenal glands are unremarkable. Mild bilateral renal artery calcifications. Kidneys are normal, without renal calculi, focal lesion, or hydronephrosis. Bladder is unremarkable.  Stomach/Bowel: *Well-positioned percutaneous gastrostomy tube, epigastric antrum. Decompressed stomach. *Nonobstructed small bowel.  Normal appendix. *Air-and-fluid distention of colon, greatest at the cecum (#3, 55/95 and #6, 37/128) and extending to the level of the sigmoid colon. *No CT evidence of discrete obstructing lesion. No free air or evidence of vascular compromise. Vascular/Lymphatic: Aortic atherosclerosis. No enlarged abdominal or pelvic lymph nodes. Reproductive: Mild  prostatomegaly, measuring up to 5.1 cm. Other: No abdominal wall hernia or abnormality. No abdominopelvic ascites. Musculoskeletal: Anterior abdominal wall soft tissue contusions, likely injection sites. No interval osseous findings. IMPRESSION: Gaseous distension of colon, greatest at cecum and extending to sigmoid. No discrete large bowel obstruction, free air or evidence of vascular compromise. 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 Attention on follow-up. Electronically Signed   By: Roanna Banning M.D.   On: 06/12/2021 09:08   DG Chest Port 1 View  Result Date: 06/11/2021 CLINICAL DATA:  Abdominal distention EXAM: PORTABLE CHEST 1 VIEW COMPARISON:  Chest CT dated June 10, 2021; x-ray abdomen dated July 22, 2021 FINDINGS: Chest: Tracheostomy tube in place. Cardiac and mediastinal contours are unchanged. Unchanged mild bilateral interstitial opacities no new parenchymal process. No large pleural effusion or pneumothorax. Abdomen: Increased distention of air-filled bowel loops when compared with prior exam. No evidence of pneumatosis. No acute osseous abnormality. Gastrostomy tube in place. IMPRESSION: Increased distention of multiple air-filled bowel loops, concerning for obstruction or ileus. No new parenchymal process in the chest. Electronically Signed   By: Allegra Lai M.D.   On: 06/11/2021 15:07   DG Abd Portable 1V  Result Date:  06/11/2021 CLINICAL DATA:  Abdominal distention EXAM: PORTABLE CHEST 1 VIEW COMPARISON:  Chest CT dated June 10, 2021; x-ray abdomen dated July 22, 2021 FINDINGS: Chest: Tracheostomy tube in place. Cardiac and mediastinal contours are unchanged. Unchanged mild bilateral interstitial opacities no new parenchymal process. No large pleural effusion or pneumothorax. Abdomen: Increased distention of air-filled bowel loops when compared with prior exam. No evidence of pneumatosis. No acute osseous abnormality. Gastrostomy tube in place. IMPRESSION: Increased distention of multiple air-filled bowel loops, concerning for obstruction or ileus. No new parenchymal process in the chest. Electronically Signed   By: Allegra Lai M.D.   On: 06/11/2021 15:07     Medications   Scheduled Meds:  arformoterol  15 mcg Nebulization BID   atorvastatin  40 mg Per Tube QHS   budesonide (PULMICORT) nebulizer solution  0.5 mg Nebulization BID   chlorhexidine  15 mL Mouth Rinse BID   clonazePAM  0.5 mg Per Tube Q6H   cyclobenzaprine  5 mg Per Tube Q8H   docusate  200 mg Per Tube BID   feeding supplement (PROSource TF)  45 mL Per Tube BID   heparin  5,000 Units Subcutaneous Q8H   levETIRAcetam  500 mg Per Tube BID   mouth rinse  15 mL Mouth Rinse q12n4p   oxyCODONE  5 mg Per Tube Q6H   pantoprazole sodium  40 mg Per Tube QHS   polyethylene glycol  17 g Per Tube Daily   propranolol  20 mg Per Tube Q4H   revefenacin  175 mcg Nebulization Daily   senna  1 tablet Per Tube Daily   Continuous Infusions:  sodium chloride Stopped (06/03/21 1725)   feeding supplement (JEVITY 1.2 CAL) Stopped (06/12/21 0750)   meropenem (MERREM) IV 1 g (06/12/21 1108)       LOS: 44 days      Briant Cedar, MD [facility]  06/12/2021, 5:38 PM

## 2021-06-12 NOTE — Progress Notes (Signed)
Daily Progress Note   Patient Name: Vincent [name]       Date: 06/12/2021 DOB: 1970/11/26  Age: 50 y.o. MRN#: [id number] Attending Physician: Alma Friendly, MD Primary Care Physician: Pcp, No Admit Date: 04/29/2021  Reason for Consultation/Follow-up: Establishing goals of care  Subjective: Medical records reviewed. Patient assessed at the bedside. He is unresponsive. No family present during my evaluation.  I called patient's sister Maree Krabbe to provide updates and follow up on goals of care conversation. Created space and opportunity for family's thoughts and feelings on patient's current illness. Maree Krabbe states that she has not received an update since last week. I informed her of patient's worsening condition, including sepsis, AKI and sputum culture results. I shared my concern that patient has been suffering from fevers, increased work of breathing, and diaphoresis.   She is shocked to hear this information and shares that she is overwhelmed with coordinating care for both of her siblings. Counseled on the importance of ongoing goals of care discussions based on clinical changes and quality of life. Maree Krabbe tells me that she continues to desire patient to receive all aggressive interventions offered. She is agreeable with the current care plan and would accept a SNF bed offer out of state if necessary to find placement. We discussed the likelihood of continued decline and recurrent complications. Encouraged to consider reflecting on [employer] for [employer] booklet and Maree Krabbe states that she not yet had the opportunity to review.  Questions and concerns addressed. Family was encouraged to call with additional needs or concerns. PMT will continue to support holistically.     Length of  Stay: 44  Current Medications: Scheduled Meds:  . arformoterol  15 mcg Nebulization BID  . atorvastatin  40 mg Per Tube QHS  . budesonide (PULMICORT) nebulizer solution  0.5 mg Nebulization BID  . chlorhexidine gluconate (MEDLINE KIT)  15 mL Mouth Rinse BID  . clonazePAM  0.5 mg Per Tube Q6H  . cyclobenzaprine  5 mg Per Tube Q8H  . docusate  200 mg Per Tube BID  . feeding supplement (PROSource TF)  45 mL Per Tube BID  . heparin  5,000 Units Subcutaneous Q8H  . levETIRAcetam  500 mg Per Tube BID  . mouth rinse  15 mL Mouth Rinse 10 times per day  . oxyCODONE  5 mg Per Tube Q6H  . pantoprazole sodium  40 mg Per Tube QHS  . polyethylene glycol  17 g Per Tube Daily  . propranolol  20 mg Per Tube Q4H  . revefenacin  175 mcg Nebulization Daily  . senna  1 tablet Per Tube Daily    Continuous Infusions: . sodium chloride Stopped (06/03/21 1725)  . feeding supplement (JEVITY 1.2 CAL) Stopped (06/12/21 0750)  . meropenem (MERREM) IV 1 g (06/12/21 0331)    PRN Meds: acetaminophen (TYLENOL) oral liquid 160 mg/5 mL, albuterol, glycopyrrolate, metoprolol tartrate, midazolam, polyethylene glycol  Physical Exam Vitals and nursing note reviewed.  Constitutional:      General: He is not in acute distress.    Appearance: He is ill-appearing.     Comments: Trach collar  Cardiovascular:     Rate and Rhythm: Tachycardia present.  Pulmonary:     Effort: Tachypnea present.  Neurological:     Mental Status: He is unresponsive.            Vital Signs: BP (!) 135/92 (BP Location: Right Arm)   Pulse (!) 118   Temp (!) 101.6 F (38.7 C) (Axillary)   Resp (!) 40   Ht 5' 8"  (1.727 m)   Wt 63.2 kg   SpO2 100%   BMI 21.20 kg/m  SpO2: SpO2: 100 % O2 Device: O2 Device: Tracheostomy Collar O2 Flow Rate: O2 Flow Rate (L/min): 5 L/min  Intake/output summary:  Intake/Output Summary (Last 24 hours) at 06/12/2021 0919 Last data filed at 06/12/2021 0745 Gross per 24 hour  Intake 2072.5 ml   Output 950 ml  Net 1122.5 ml   LBM: Last BM Date: 06/11/21 Baseline Weight: Weight: 70 kg Most recent weight: Weight: 63.2 kg       Palliative Assessment/Data: 10%, on TF      Patient Active Problem List   Diagnosis Date Noted  . Goals of care, counseling/discussion   . Tracheostomy in place [facility])   . Anoxic encephalopathy ([place])   . Seizure-like activity ([place])   . Acute respiratory failure with hypoxia ([place])   . Cardiac arrest [facility]) 04/29/2021    Palliative Care Assessment & Plan   Patient Profile: 50 y.o. male  with past medical history of CVA in 2016, poorly controlled DM2, emphysema, and tobacco abuse admitted on 04/29/2021 with PEA arrest at home, likely respiratory driven.    Patient received CPR x6 minutes and epi x1, ROSC, intubated in the ED, admitted to [facility]. He developed acute systolic/diastolic CHF after cardiac arrest. Keppra and depakote were started for myoclonic jerking and patient remains encephalopathic. Currently s/p PEG/trach with moderate free air on 8/25 CT of the abdomen, likely due to PEG placement. Palliative medicine has been consulted to assist with goals of care conversation.    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 Failure to wean from ventilator s/p tracheostomy Sepsis AKI Acute combined systolic/diastolic CHF Goals of care conversation  Recommendations/Plan: Goal remains to continue full scope treatment Patient's sister would accept out of state facility for long-term placement if necessary Patient's sister requests frequent updates from primary team Psychosocial and emotional support provided PMT remains available to family for support and discussion of options if goals of care change, please reach out with acute needs   Goals of Care and Additional Recommendations: Limitations on Scope of Treatment: Full Scope Treatment  Prognosis:  Poor prognosis given acute decline s/p anoxic  brain injury, multiple comorbidities   Discharge Planning:  To Be Determined  Care plan was discussed with Patient's sister [name]  Total time: 25 minutes Greater than 50% of this time was spent in counseling and coordinating care related to the above assessment and plan.  Dorthy Cooler, PA-C Palliative Medicine Team Team phone # [phone]  Thank you for allowing the Palliative Medicine Team to assist in the care of this patient. Please utilize secure chat with additional questions, if there is no response within 30 minutes please call the above phone number.  Palliative Medicine Team providers are available by phone from 7am to 7pm daily and can be reached through the team cell phone.  Should this patient require assistance outside of these hours, please call the patient's attending physician.

## 2021-06-13 DIAGNOSIS — G931 Anoxic brain damage, not elsewhere classified: Secondary | ICD-10-CM | POA: Diagnosis not present

## 2021-06-13 DIAGNOSIS — Z7189 Other specified counseling: Secondary | ICD-10-CM | POA: Diagnosis not present

## 2021-06-13 DIAGNOSIS — I469 Cardiac arrest, cause unspecified: Secondary | ICD-10-CM | POA: Diagnosis not present

## 2021-06-13 DIAGNOSIS — N179 Acute kidney failure, unspecified: Secondary | ICD-10-CM | POA: Diagnosis not present

## 2021-06-13 DIAGNOSIS — J9601 Acute respiratory failure with hypoxia: Secondary | ICD-10-CM | POA: Diagnosis not present

## 2021-06-13 DIAGNOSIS — R569 Unspecified convulsions: Secondary | ICD-10-CM | POA: Diagnosis not present

## 2021-06-13 LAB — CBC WITH DIFFERENTIAL/PLATELET
Abs Immature Granulocytes: 0 10*3/uL (ref 0.00–0.07)
Basophils Absolute: 0 10*3/uL (ref 0.0–0.1)
Basophils Relative: 0 %
Eosinophils Absolute: 0 10*3/uL (ref 0.0–0.5)
Eosinophils Relative: 0 %
HCT: 41.9 % (ref 39.0–52.0)
Hemoglobin: 13.3 g/dL (ref 13.0–17.0)
Lymphocytes Relative: 35 %
Lymphs Abs: 9.3 10*3/uL — ABNORMAL HIGH (ref 0.7–4.0)
MCH: 28.9 pg (ref 26.0–34.0)
MCHC: 31.7 g/dL (ref 30.0–36.0)
MCV: 90.9 fL (ref 80.0–100.0)
Monocytes Absolute: 3.2 10*3/uL — ABNORMAL HIGH (ref 0.1–1.0)
Monocytes Relative: 12 %
Neutro Abs: 14.2 10*3/uL — ABNORMAL HIGH (ref 1.7–7.7)
Neutrophils Relative %: 53 %
Platelets: 332 10*3/uL (ref 150–400)
RBC: 4.61 MIL/uL (ref 4.22–5.81)
RDW: 15.7 % — ABNORMAL HIGH (ref 11.5–15.5)
WBC: 26.7 10*3/uL — ABNORMAL HIGH (ref 4.0–10.5)
nRBC: 0 /100 WBC
nRBC: 0.3 % — ABNORMAL HIGH (ref 0.0–0.2)

## 2021-06-13 LAB — GLUCOSE, CAPILLARY
Glucose-Capillary: 149 mg/dL — ABNORMAL HIGH (ref 70–99)
Glucose-Capillary: 161 mg/dL — ABNORMAL HIGH (ref 70–99)
Glucose-Capillary: 162 mg/dL — ABNORMAL HIGH (ref 70–99)
Glucose-Capillary: 166 mg/dL — ABNORMAL HIGH (ref 70–99)
Glucose-Capillary: 156 mg/dL — ABNORMAL HIGH (ref 70–99)
Glucose-Capillary: 144 mg/dL — ABNORMAL HIGH (ref 70–99)

## 2021-06-13 LAB — BASIC METABOLIC PANEL
Anion gap: 14 (ref 5–15)
BUN: 63 mg/dL — ABNORMAL HIGH (ref 6–20)
CO2: 25 mmol/L (ref 22–32)
Calcium: 9.9 mg/dL (ref 8.9–10.3)
Chloride: 113 mmol/L — ABNORMAL HIGH (ref 98–111)
Creatinine, Ser: 1.37 mg/dL — ABNORMAL HIGH (ref 0.61–1.24)
GFR, Estimated: >60 mL/min (ref >60)
Glucose, Bld: 154 mg/dL — ABNORMAL HIGH (ref 70–99)
Potassium: 3.9 mmol/L (ref 3.5–5.1)
Sodium: 152 mmol/L — ABNORMAL HIGH (ref 135–145)

## 2021-06-13 MED ORDER — OXYCODONE HCL 5 MG PO TABS: 2.5000 mg | ORAL_TABLET | Freq: Four times a day (QID) | ORAL | Status: DC

## 2021-06-13 MED ORDER — DEXTROSE 5 % IV SOLN: INTRAVENOUS | Status: DC

## 2021-06-13 MED ORDER — METOPROLOL TARTRATE 5 MG/5ML IV SOLN: 2.5000 mg | Freq: Three times a day (TID) | INTRAVENOUS | Status: DC | PRN

## 2021-06-13 NOTE — Consult Note (Signed)
[facility] Surgery Consult Note  Ferrel Lebaron Dambra [birth year]  [id number].    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  Patient had undergone a family witnessed cardiac arrest.  He was found to have agonal breaths at 46/min junctional rhythm then went asystolic.  He underwent 6 minutes of CPR, he was intubated, and transported to the ED. He was him intubated on admission but history from a family led to the conclusion this was probably respiratory induced.  He was admitted with cardiac arrest/PEA/likely respiratory driven.  Septic shock, severe sepsis with AKI.  He had bilateral pulmonary infiltrates consistent with pneumonia.  He had an old stroke on CT, but no apparent acute changes.  Additional history includes emphysema, history of prior CVA, history of type 2 diabetes.  Hospital course significant for ileus on 05/03/2021 which required an OG placement.  He underwent tracheostomy on 05/12/2021, PEG placement on 05/21/2021.  He was placed on scheduled opiates for possible withdrawal on 05/30/2021.  Patient was additionally placed on steroids for pulmonary issues. A film was obtained on 06/11/2021 for abdominal distention and shows increased distention multiple air-filled bowel concerning for obstruction or ileus.  CT obtained today shows a well positioned gastrostomy tube in the antrum/stomach is decompressed there is no small bowel obstruction.  There is an air-fluid distention of the colon greatest at the cecum and extending to the level of the sigmoid colon no evidence of discrete obstructing lesions no free air or evidence of vascular compromise.  My measurement was between 23mm to 93 mm.  We are asked to see.   ROS: Review of Systems  Unable to perform ROS: Patient unresponsive   Family History  Problem Relation Age of Onset   Coronary  artery disease Father    Stroke Sister    Coronary artery disease Brother     History reviewed. No pertinent past medical history.  Past Surgical History:  Procedure Laterality Date   IR GASTROSTOMY TUBE MOD SED  05/21/2021    Social History:  reports that he has been smoking cigarettes. He has never used smokeless tobacco. No history on file for alcohol use and drug use.  Allergies: No Known Allergies  Medications Prior to Admission  Medication Sig Dispense Refill   amLODipine (NORVASC) 5 MG tablet Take 5 mg by mouth daily.     atorvastatin (LIPITOR) 40 MG tablet Take 40 mg by mouth at bedtime.      Blood pressure 121/82, pulse (!) 117, temperature 99.5 F (37.5 C), temperature source Axillary, resp. rate (!) 28, height 5\' 8"  (1.727 m), weight 63.2 kg, SpO2 100 %. Physical Exam:  General: Thin black male lying in bed.  He is unresponsive he does blink otherwise there is no response. HEENT: head is normocephalic, atraumatic.  Sclera are noninjected.  Pulls are equal ears and nose without any masses or lesions.  Mouth is pink and moist Heart: regular, rate, and rhythm.  Normal s1,s2. No obvious murmurs, gallops, or rubs noted.  Palpable radial and pedal pulses bilaterally Lungs: CTAB, no wheezes, rhonchi, or rales noted.  Respiratory effort nonlabored Abd: soft,, distended no bowel sounds, no indication of tenderness.  No masses, hernias, or organomegaly MS: all 4 extremities are symmetrical with no cyanosis, clubbing, or edema. Skin: warm and dry with no masses, lesions, or rashes Neuro: Patient is completely unresponsive.  Blinking was  the only neurologic response I saw. Psych: Anoxic brain with injury no response to any questions.  Results for orders placed or performed during the hospital encounter of 04/29/21 (from the past 48 hour(s))  Glucose, capillary     Status: Abnormal   Collection Time: 06/11/21  3:47 PM  Result Value Ref Range   Glucose-Capillary 267 (H) 70 - 99  mg/dL    Comment: Glucose reference range applies only to samples taken after fasting for at least 8 hours.  Glucose, capillary     Status: Abnormal   Collection Time: 06/11/21  8:07 PM  Result Value Ref Range   Glucose-Capillary 159 (H) 70 - 99 mg/dL    Comment: Glucose reference range applies only to samples taken after fasting for at least 8 hours.  Glucose, capillary     Status: Abnormal   Collection Time: 06/12/21 12:10 AM  Result Value Ref Range   Glucose-Capillary 210 (H) 70 - 99 mg/dL    Comment: Glucose reference range applies only to samples taken after fasting for at least 8 hours.  CBC with Differential/Platelet     Status: Abnormal   Collection Time: 06/12/21  3:43 AM  Result Value Ref Range   WBC 28.1 (H) 4.0 - 10.5 K/uL   RBC 4.75 4.22 - 5.81 MIL/uL   Hemoglobin 13.6 13.0 - 17.0 g/dL   HCT 73.2 20.2 - 54.2 %   MCV 90.7 80.0 - 100.0 fL   MCH 28.6 26.0 - 34.0 pg   MCHC 31.6 30.0 - 36.0 g/dL   RDW 70.6 (H) 23.7 - 62.8 %   Platelets 378 150 - 400 K/uL   nRBC 0.5 (H) 0.0 - 0.2 %   Neutrophils Relative % 71 %   Neutro Abs 20.0 (H) 1.7 - 7.7 K/uL   Lymphocytes Relative 17 %   Lymphs Abs 4.8 (H) 0.7 - 4.0 K/uL   Monocytes Relative 9 %   Monocytes Absolute 2.5 (H) 0.1 - 1.0 K/uL   Eosinophils Relative 0 %   Eosinophils Absolute 0.0 0.0 - 0.5 K/uL   Basophils Relative 0 %   Basophils Absolute 0.1 0.0 - 0.1 K/uL   WBC Morphology See Note     Comment: Mild Left Shift. 1 to 5% Metas and Myelos, Occ Pro Noted.  Vaculated Neutrophils    Immature Granulocytes 3 %   Abs Immature Granulocytes 0.71 (H) 0.00 - 0.07 K/uL   Polychromasia PRESENT     Comment: Performed at [facility], [address], [place], [place] [address]  Basic metabolic panel     Status: Abnormal   Collection Time: 06/12/21  3:43 AM  Result Value Ref Range   Sodium 148 (H) 135 - 145 mmol/L   Potassium 3.5 3.5 - 5.1 mmol/L   Chloride 113 (H) 98 - 111 mmol/L   CO2 23 22 - 32 mmol/L   Glucose, Bld  215 (H) 70 - 99 mg/dL    Comment: Glucose reference range applies only to samples taken after fasting for at least 8 hours.   BUN 53 (H) 6 - 20 mg/dL   Creatinine, Ser 6.16 (H) 0.61 - 1.24 mg/dL   Calcium 9.8 8.9 - 07.3 mg/dL   GFR, Estimated 58 (L) >60 mL/min    Comment: (NOTE) Calculated using the CKD-EPI Creatinine Equation (2021)    Anion gap 12 5 - 15    Comment: Performed at [facility], [address], [place], [place] [address]  Glucose, capillary  Status: Abnormal   Collection Time: 06/12/21  8:59 AM  Result Value Ref Range   Glucose-Capillary 183 (H) 70 - 99 mg/dL    Comment: Glucose reference range applies only to samples taken after fasting for at least 8 hours.  Lactic acid, plasma     Status: None   Collection Time: 06/12/21 11:01 AM  Result Value Ref Range   Lactic Acid, Venous 1.3 0.5 - 1.9 mmol/L    Comment: Performed at [facility], [address] [place], [place] [address]  Glucose, capillary     Status: Abnormal   Collection Time: 06/12/21 11:37 AM  Result Value Ref Range   Glucose-Capillary 153 (H) 70 - 99 mg/dL    Comment: Glucose reference range applies only to samples taken after fasting for at least 8 hours.  Lactic acid, plasma     Status: None   Collection Time: 06/12/21  2:09 PM  Result Value Ref Range   Lactic Acid, Venous 1.4 0.5 - 1.9 mmol/L    Comment: Performed at [facility], [address], [place], [place] [address]  Glucose, capillary     Status: Abnormal   Collection Time: 06/12/21  5:01 PM  Result Value Ref Range   Glucose-Capillary 149 (H) 70 - 99 mg/dL    Comment: Glucose reference range applies only to samples taken after fasting for at least 8 hours.  Glucose, capillary     Status: Abnormal   Collection Time: 06/12/21  8:17 PM  Result Value Ref Range   Glucose-Capillary 161 (H) 70 - 99 mg/dL    Comment: Glucose reference range applies only to samples taken after fasting for at least 8 hours.  CBC with  Differential/Platelet     Status: Abnormal   Collection Time: 06/13/21  1:25 AM  Result Value Ref Range   WBC 26.7 (H) 4.0 - 10.5 K/uL   RBC 4.61 4.22 - 5.81 MIL/uL   Hemoglobin 13.3 13.0 - 17.0 g/dL   HCT 76.7 20.9 - 47.0 %   MCV 90.9 80.0 - 100.0 fL   MCH 28.9 26.0 - 34.0 pg   MCHC 31.7 30.0 - 36.0 g/dL   RDW 96.2 (H) 83.6 - 62.9 %   Platelets 332 150 - 400 K/uL   nRBC 0.3 (H) 0.0 - 0.2 %   Neutrophils Relative % 53 %   Neutro Abs 14.2 (H) 1.7 - 7.7 K/uL   Lymphocytes Relative 35 %   Lymphs Abs 9.3 (H) 0.7 - 4.0 K/uL   Monocytes Relative 12 %   Monocytes Absolute 3.2 (H) 0.1 - 1.0 K/uL   Eosinophils Relative 0 %   Eosinophils Absolute 0.0 0.0 - 0.5 K/uL   Basophils Relative 0 %   Basophils Absolute 0.0 0.0 - 0.1 K/uL   nRBC 0 0 /100 WBC   Abs Immature Granulocytes 0.00 0.00 - 0.07 K/uL    Comment: Performed at [facility], [address], [place], [place] [address]  Basic metabolic panel     Status: Abnormal   Collection Time: 06/13/21  1:25 AM  Result Value Ref Range   Sodium 152 (H) 135 - 145 mmol/L   Potassium 3.9 3.5 - 5.1 mmol/L   Chloride 113 (H) 98 - 111 mmol/L   CO2 25 22 - 32 mmol/L   Glucose, Bld 154 (H) 70 - 99 mg/dL    Comment: Glucose reference range applies only to samples taken after fasting for at least 8 hours.   BUN  63 (H) 6 - 20 mg/dL   Creatinine, Ser 3.14 (H) 0.61 - 1.24 mg/dL   Calcium 9.9 8.9 - 97.0 mg/dL   GFR, Estimated >26 >37 mL/min    Comment: (NOTE) Calculated using the CKD-EPI Creatinine Equation (2021)    Anion gap 14 5 - 15    Comment: Performed at [facility], [address], [place], [place] [address]  Glucose, capillary     Status: Abnormal   Collection Time: 06/13/21  4:33 AM  Result Value Ref Range   Glucose-Capillary 149 (H) 70 - 99 mg/dL    Comment: Glucose reference range applies only to samples taken after fasting for at least 8 hours.  Glucose, capillary     Status: Abnormal   Collection Time: 06/13/21  8:29  AM  Result Value Ref Range   Glucose-Capillary 161 (H) 70 - 99 mg/dL    Comment: Glucose reference range applies only to samples taken after fasting for at least 8 hours.  Glucose, capillary     Status: Abnormal   Collection Time: 06/13/21 11:26 AM  Result Value Ref Range   Glucose-Capillary 166 (H) 70 - 99 mg/dL    Comment: Glucose reference range applies only to samples taken after fasting for at least 8 hours.  Glucose, capillary     Status: Abnormal   Collection Time: 06/13/21 11:29 AM  Result Value Ref Range   Glucose-Capillary 162 (H) 70 - 99 mg/dL    Comment: Glucose reference range applies only to samples taken after fasting for at least 8 hours.   CT ABDOMEN PELVIS WO CONTRAST  Result Date: 06/12/2021 CLINICAL DATA:  Abdominal abscess/infection. EXAM: CT ABDOMEN AND PELVIS WITHOUT CONTRAST TECHNIQUE: Multidetector CT imaging of the abdomen and pelvis was performed following the standard protocol without IV contrast. COMPARISON:  CT abdomen pelvis, 05/22/2021 and 05/13/2021. KUB, 06/11/2021. CTA chest, 06/09/2021. FINDINGS: Lower chest: Cardiomegaly. Mild ascending aortic calcifications. Interval resolution of previously small volume pericardial effusion. Hepatobiliary: Normal noncontrast appearance. No gallstones, gallbladder wall thickening, or biliary dilatation. Pancreas: Unremarkable. No pancreatic ductal dilatation or surrounding inflammatory changes. Spleen: Normal in size without focal abnormality. Adrenals/Urinary Tract: Adrenal glands are unremarkable. Mild bilateral renal artery calcifications. Kidneys are normal, without renal calculi, focal lesion, or hydronephrosis. Bladder is unremarkable. Stomach/Bowel: *Well-positioned percutaneous gastrostomy tube, epigastric antrum. Decompressed stomach. *Nonobstructed small bowel.  Normal appendix. *Air-and-fluid distention of colon, greatest at the cecum (#3, 55/95 and #6, 37/128) and extending to the level of the sigmoid colon. *No CT  evidence of discrete obstructing lesion. No free air or evidence of vascular compromise. Vascular/Lymphatic: Aortic atherosclerosis. No enlarged abdominal or pelvic lymph nodes. Reproductive: Mild prostatomegaly, measuring up to 5.1 cm. Other: No abdominal wall hernia or abnormality. No abdominopelvic ascites. Musculoskeletal: Anterior abdominal wall soft tissue contusions, likely injection sites. No interval osseous findings. IMPRESSION: Gaseous distension of colon, greatest at cecum and extending to sigmoid. No discrete large bowel obstruction, free air or evidence of vascular compromise. 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 Attention on follow-up. Electronically Signed   By: Roanna Banning M.D.   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  From the CT it appears SB is not distended.  I would make him NPO, place PEG  on straight drain, and try to decompress him some from this end. Hold all the medicines that contribute to decreased peristalsis, I have listed them above.  If conservative management fails neostigmine is a possibility and  possible colonoscopy for decompression.    [facility] 06/13/2021, 3:09 PM Please see Amion for pager number during day hours 7:00am-4:30pm

## 2021-06-13 NOTE — Progress Notes (Signed)
PROGRESS NOTE    Vincent [name]   DJT:[id number]  DOB: 11-26-1970  PCP: Pcp, No    DOA: 04/29/2021 LOS: 45    Brief Narrative / Hospital Course to Date:   50 yo male smoker who was admitted to ICU after PEA cardiac arrest with ROSC after 6 minutes of resuscitation.  CT head showed an old infarct, nothing acute.  CT chest with bilateral dense lower lobe consolidations with emphysematous changes.   Complicated hospital course as follows, per PCCM. TRH assumed care 06/07/21.   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 / Anoxic brain injury 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 Continue Klonopin, Flexeril, Keppra once able Midazolam PRN abnormal muscle movements  Acute respiratory failure with hypoxia due to Cardiac Arrest and Aspiration Pneumonia Failure to wean from ventilator s/p tracheostomy Emphysema, centrilobular PCCM continues to follow for trach management Supplement O2 to maintain sat >90% Continue Yupelri, Brovana, Pulmnicort, PRN albuterol Completed 5 day course Prednisone and 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 Follow up imaging in ~3 months if patient survives hospital course  Hyperlipidemia Lipitor  Goals of care discussion Patient with persistent encephalopathy, guarded prognosis, now noted to be septic and rapidly declining Palliative care reconsulted for further discussions of goals of care DNR    Patient BMI: Body mass index is 21.2 kg/m.   DVT prophylaxis: heparin injection 5,000 Units Start: 04/29/21 2230 SCDs Start: 04/29/21 2227   Diet:     Code Status: DNR   Subjective 06/13/21   Patient continues to decline, unable to communicate    Disposition Plan & Communication   Status is: Inpatient  Remains inpatient appropriate because:Inpatient level of care appropriate due to severity of illness.  Requires placement, limited options due to insurance and trach/peg status.    Dispo: The patient is from: Home              Anticipated d/c is to: SNF vs LTAC?  Patient currently is not medically stable to d/c.   Difficult to place patient Yes    Consults, Procedures, Significant Events   Consultants:  PCCM  Procedures:  As above "significant events"  Antimicrobials:  Anti-infectives (From admission, onward)     Start     Dose/Rate Route Frequency Ordered Stop   06/10/21 1000  meropenem (MERREM) 1 g in sodium chloride 0.9 % 100 mL IVPB        1 g 200 mL/hr over 30 Minutes Intravenous Every 8 hours 06/10/21 0900     06/09/21 1045  piperacillin-tazobactam (ZOSYN) IVPB 3.375 g  Status:  Discontinued        3.375 g 12.5 mL/hr over 240 Minutes Intravenous Every 8 hours 06/09/21 0959 06/10/21 0840   06/06/21 1000  azithromycin (ZITHROMAX) tablet 250 mg  Status:  Discontinued       See Hyperspace for full Linked Orders Report.   250 mg Oral Daily 06/05/21 1014 06/05/21 1629   06/06/21 1000  azithromycin (ZITHROMAX) tablet 250 mg       See Hyperspace for full Linked Orders Report.   250 mg Per Tube Daily 06/05/21 1629 06/09/21 1007   06/05/21 1100  azithromycin (ZITHROMAX) tablet 500 mg       See Hyperspace for full Linked Orders Report.   500 mg Oral  Once 06/05/21 1014 06/05/21 1124   05/30/21 1530  vancomycin (VANCOREADY) IVPB 1250 mg/250 mL  Status:  Discontinued        1,250 mg 166.7 mL/hr over 90 Minutes Intravenous Every 24 hours 05/29/21 1504 05/30/21 1028   05/30/21 0100  ceFEPIme (MAXIPIME) 2 g in sodium chloride 0.9 % 100 mL IVPB  Status:  Discontinued        2 g 200 mL/hr over 30 Minutes Intravenous Every 12 hours 05/29/21 1504 05/30/21 1028   05/29/21 1430  ceFEPIme (MAXIPIME) 2 g in sodium chloride 0.9 % 100 mL IVPB        2 g 200 mL/hr over 30 Minutes Intravenous  Once 05/29/21 1331 05/29/21 1614   05/29/21 1430  vancomycin (VANCOREADY) IVPB 1500 mg/300 mL        1,500 mg 150 mL/hr over 120 Minutes Intravenous  Once 05/29/21 1331 05/29/21 1814   05/23/21 1000  fluconazole (DIFLUCAN) tablet 100 mg       See Hyperspace for full Linked Orders Report.   100 mg Per Tube Daily 05/22/21 1043 05/31/21 0940   05/22/21 2000  piperacillin-tazobactam (ZOSYN) IVPB 3.375 g  Status:  Discontinued        3.375 g 12.5 mL/hr over 240 Minutes Intravenous Every 8 hours 05/22/21 1227 05/23/21 1017    05/22/21 1315  piperacillin-tazobactam (ZOSYN) IVPB 3.375 g        3.375 g 100 mL/hr over 30 Minutes Intravenous  Once 05/22/21 1227 05/22/21 1404   05/22/21 1130  fluconazole (DIFLUCAN) tablet 100 mg  Status:  Discontinued        100 mg Per Tube Daily 05/22/21 1035 05/22/21 1042   05/22/21 1130  fluconazole (DIFLUCAN) tablet 200 mg       See Hyperspace for full Linked Orders Report.   200 mg Per Tube  Once 05/22/21 1043 05/22/21 1054   05/21/21 1112  ceFAZolin (ANCEF) IVPB 1 g/50 mL premix        over 30 Minutes  Continuous PRN 05/21/21 1120 05/21/21 1148   05/21/21 0600  ceFAZolin (ANCEF) IVPB 2g/100 mL premix        2  g 200 mL/hr over 30 Minutes Intravenous To Radiology 05/20/21 1318 05/21/21 1142   05/16/21 1800  Ampicillin-Sulbactam (UNASYN) 3 g in sodium chloride 0.9 % 100 mL IVPB  Status:  Discontinued        3 g 200 mL/hr over 30 Minutes Intravenous Every 6 hours 05/16/21 1107 05/19/21 1014   05/15/21 1100  piperacillin-tazobactam (ZOSYN) IVPB 3.375 g  Status:  Discontinued        3.375 g 12.5 mL/hr over 240 Minutes Intravenous Every 8 hours 05/15/21 1000 05/16/21 1107   05/05/21 1400  amoxicillin-clavulanate (AUGMENTIN) 875-125 MG per tablet 1 tablet        1 tablet Per Tube Every 12 hours 05/05/21 1358 05/06/21 1453   04/30/21 2000  ceFEPIme (MAXIPIME) 2 g in sodium chloride 0.9 % 100 mL IVPB  Status:  Discontinued        2 g 200 mL/hr over 30 Minutes Intravenous Every 12 hours 04/30/21 0814 04/30/21 0922   04/30/21 2000  cefTRIAXone (ROCEPHIN) 2 g in sodium chloride 0.9 % 100 mL IVPB  Status:  Discontinued        2 g 200 mL/hr over 30 Minutes Intravenous Every 24 hours 04/30/21 0922 05/05/21 1358   04/30/21 1015  azithromycin (ZITHROMAX) 500 mg in sodium chloride 0.9 % 250 mL IVPB  Status:  Discontinued        500 mg 250 mL/hr over 60 Minutes Intravenous Every 24 hours 04/30/21 0922 05/01/21 0925   04/30/21 0800  ceFEPIme (MAXIPIME) 2 g in sodium chloride 0.9 % 100 mL IVPB   Status:  Discontinued        2 g 200 mL/hr over 30 Minutes Intravenous Every 8 hours 04/29/21 2239 04/30/21 0814   04/29/21 2359  vancomycin (VANCOREADY) IVPB 1500 mg/300 mL  Status:  Discontinued        1,500 mg 150 mL/hr over 120 Minutes Intravenous Daily at bedtime 04/29/21 2241 04/30/21 0922   04/29/21 2200  ceFEPIme (MAXIPIME) 2 g in sodium chloride 0.9 % 100 mL IVPB        2 g 200 mL/hr over 30 Minutes Intravenous  Once 04/29/21 2151 04/30/21 0048   04/29/21 2200  azithromycin (ZITHROMAX) 500 mg in sodium chloride 0.9 % 250 mL IVPB        500 mg 250 mL/hr over 60 Minutes Intravenous  Once 04/29/21 2151 04/30/21 0256         Micro    Objective   Vitals:   06/13/21 1130 06/13/21 1436 06/13/21 1534 06/13/21 1619  BP: 131/75 121/82 118/82 118/76  Pulse: (!) 120 (!) 117 (!) 112 (!) 112  Resp: (!) 34 (!) 28 (!) 28 (!) 28  Temp: 99.5 F (37.5 C)   97.6 F (36.4 C)  TempSrc: Axillary   Axillary  SpO2: 100% 100% 100% 100%  Weight:      Height:        Intake/Output Summary (Last 24 hours) at 06/13/2021 1745 Last data filed at 06/13/2021 1500 Gross per 24 hour  Intake 1288.69 ml  Output 601 ml  Net 687.69 ml   Filed Weights   06/03/21 0500 06/04/21 0500 06/08/21 0353  Weight: 68.3 kg 68.4 kg 63.2 kg    Physical Exam: General: Critically ill-appearing, unable to follow commands, unresponsive, appears to be awake Cardiovascular: S1, S2 present Respiratory: Diminished breath sounds bilaterally, increased work of breathing Abdomen: Soft, ??tender, +++distended, bowel sounds present, PEG in place Musculoskeletal: No bilateral pedal edema noted, offloading boots on bilateral  feet Skin: Normal Psychiatry: Unable to assess     Labs   Data Reviewed: I have personally reviewed following labs and imaging studies  CBC: Recent Labs  Lab 06/09/21 0350 06/10/21 0157 06/11/21 0242 06/12/21 0343 06/13/21 0125  WBC 24.4* 25.9* 28.2* 28.1* 26.7*  NEUTROABS  --   --   --   20.0* 14.2*  HGB 14.4 13.6 14.0 13.6 13.3  HCT 44.8 41.9 43.6 43.1 41.9  MCV 88.0 88.0 89.7 90.7 90.9  PLT 513* 474* 383 378 332   Basic Metabolic Panel: Recent Labs  Lab 06/08/21 0102 06/09/21 0350 06/10/21 0157 06/11/21 0242 06/12/21 0343 06/13/21 0125  NA 135 138 141 144 148* 152*  K 4.6 5.1 4.5 4.7 3.5 3.9  CL 94* 97* 100 103 113* 113*  CO2 27 28 28 24 23 25   GLUCOSE 106* 144* 140* 131* 215* 154*  BUN 36* 44* 51* 52* 53* 63*  CREATININE 0.82 1.18 1.32* 1.46* 1.45* 1.37*  CALCIUM 10.6* 10.9* 10.9* 10.5* 9.8 9.9  MG 2.2  --   --   --   --   --   PHOS 5.8*  --   --   --   --   --    GFR: Estimated Creatinine Clearance: 57 mL/min (A) (by C-G formula based on SCr of 1.37 mg/dL (H)). Liver Function Tests: Recent Labs  Lab 06/10/21 0157  AST 54*  ALT 98*  ALKPHOS 61  BILITOT 0.6  PROT 8.7*  ALBUMIN 4.0   No results for input(s): LIPASE, AMYLASE in the last 168 hours. No results for input(s): AMMONIA in the last 168 hours. Coagulation Profile: No results for input(s): INR, PROTIME in the last 168 hours. Cardiac Enzymes: Recent Labs  Lab 06/09/21 0910  CKTOTAL 183   BNP (last 3 results) No results for input(s): PROBNP in the last 8760 hours. HbA1C: No results for input(s): HGBA1C in the last 72 hours. CBG: Recent Labs  Lab 06/13/21 0433 06/13/21 0829 06/13/21 1126 06/13/21 1129 06/13/21 1616  GLUCAP 149* 161* 166* 162* 156*   Lipid Profile: No results for input(s): CHOL, HDL, LDLCALC, TRIG, CHOLHDL, LDLDIRECT in the last 72 hours. Thyroid Function Tests: No results for input(s): TSH, T4TOTAL, FREET4, T3FREE, THYROIDAB in the last 72 hours. Anemia Panel: No results for input(s): VITAMINB12, FOLATE, FERRITIN, TIBC, IRON, RETICCTPCT in the last 72 hours. Sepsis Labs: Recent Labs  Lab 06/09/21 0350 06/09/21 0910 06/09/21 1146 06/10/21 0157 06/10/21 2100 06/12/21 1101 06/12/21 1409  PROCALCITON <0.10  --   --  0.13  --   --   --   LATICACIDVEN  --     < > 2.8*  --  1.5 1.3 1.4   < > = values in this interval not displayed.    Recent Results (from the past 240 hour(s))  Culture, Respiratory w Gram Stain     Status: None   Collection Time: 06/09/21  9:28 AM   Specimen: Tracheal Aspirate; Respiratory  Result Value Ref Range Status   Specimen Description TRACHEAL ASPIRATE  Final   Special Requests NONE  Final   Gram Stain   Final    FEW SQUAMOUS EPITHELIAL CELLS PRESENT FEW WBC SEEN ABUNDANT GRAM POSITIVE COCCI    Culture   Final    RARE KLEBSIELLA PNEUMONIAE ABUNDANT STAPHYLOCOCCUS AUREUS Confirmed Extended Spectrum Beta-Lactamase Producer (ESBL).  In bloodstream infections from ESBL organisms, carbapenems are preferred over piperacillin/tazobactam. They are shown to have a lower risk of mortality. FOR KLEBSIELLA PNEUMONIAE Performed  at [facility], [address], [place], [place] [address]    Report Status 06/11/2021 FINAL  Final   Organism ID, Bacteria KLEBSIELLA PNEUMONIAE  Final   Organism ID, Bacteria STAPHYLOCOCCUS AUREUS  Final      Susceptibility   Klebsiella pneumoniae - MIC*    AMPICILLIN >=32 RESISTANT Resistant     CEFAZOLIN >=64 RESISTANT Resistant     CEFEPIME >=32 RESISTANT Resistant     CEFTAZIDIME RESISTANT Resistant     CEFTRIAXONE >=64 RESISTANT Resistant     CIPROFLOXACIN >=4 RESISTANT Resistant     GENTAMICIN >=16 RESISTANT Resistant     IMIPENEM <=0.25 SENSITIVE Sensitive     TRIMETH/SULFA >=320 RESISTANT Resistant     AMPICILLIN/SULBACTAM >=32 RESISTANT Resistant     PIP/TAZO 16 SENSITIVE Sensitive     * RARE KLEBSIELLA PNEUMONIAE   Staphylococcus aureus - MIC*    CIPROFLOXACIN <=0.5 SENSITIVE Sensitive     ERYTHROMYCIN <=0.25 SENSITIVE Sensitive     GENTAMICIN <=0.5 SENSITIVE Sensitive     OXACILLIN 0.5 SENSITIVE Sensitive     TETRACYCLINE <=1 SENSITIVE Sensitive     VANCOMYCIN <=0.5 SENSITIVE Sensitive     TRIMETH/SULFA <=10 SENSITIVE Sensitive     CLINDAMYCIN <=0.25 SENSITIVE  Sensitive     RIFAMPIN <=0.5 SENSITIVE Sensitive     Inducible Clindamycin NEGATIVE Sensitive     * ABUNDANT STAPHYLOCOCCUS AUREUS  Culture, blood (routine x 2)     Status: None (Preliminary result)   Collection Time: 06/10/21  9:20 AM   Specimen: BLOOD  Result Value Ref Range Status   Specimen Description BLOOD RIGHT ANTECUBITAL  Final   Special Requests   Final    BOTTLES DRAWN AEROBIC AND ANAEROBIC Blood Culture adequate volume   Culture   Final    NO GROWTH 3 DAYS Performed at [facility], [address] [place], [place] [address]    Report Status PENDING  Incomplete  Culture, blood (routine x 2)     Status: None (Preliminary result)   Collection Time: 06/10/21  9:20 AM   Specimen: BLOOD LEFT HAND  Result Value Ref Range Status   Specimen Description BLOOD LEFT HAND  Final   Special Requests   Final    BOTTLES DRAWN AEROBIC AND ANAEROBIC Blood Culture adequate volume   Culture   Final    NO GROWTH 3 DAYS Performed at [facility], [address] [place], [place] [address]    Report Status PENDING  Incomplete       Imaging Studies   CT ABDOMEN PELVIS WO CONTRAST  Result Date: 06/12/2021 CLINICAL DATA:  Abdominal abscess/infection. EXAM: CT ABDOMEN AND PELVIS WITHOUT CONTRAST TECHNIQUE: Multidetector CT imaging of the abdomen and pelvis was performed following the standard protocol without IV contrast. COMPARISON:  CT abdomen pelvis, 05/22/2021 and 05/13/2021. KUB, 06/11/2021. CTA chest, 06/09/2021. FINDINGS: Lower chest: Cardiomegaly. Mild ascending aortic calcifications. Interval resolution of previously small volume pericardial effusion. Hepatobiliary: Normal noncontrast appearance. No gallstones, gallbladder wall thickening, or biliary dilatation. Pancreas: Unremarkable. No pancreatic ductal dilatation or surrounding inflammatory changes. Spleen: Normal in size without focal abnormality. Adrenals/Urinary Tract: Adrenal glands are unremarkable. Mild bilateral  renal artery calcifications. Kidneys are normal, without renal calculi, focal lesion, or hydronephrosis. Bladder is unremarkable. Stomach/Bowel: *Well-positioned percutaneous gastrostomy tube, epigastric antrum. Decompressed stomach. *Nonobstructed small bowel.  Normal appendix. *Air-and-fluid distention of colon, greatest at the cecum (#3, 55/95 and #6, 37/128) and extending to the level of the sigmoid colon. *No CT  evidence of discrete obstructing lesion. No free air or evidence of vascular compromise. Vascular/Lymphatic: Aortic atherosclerosis. No enlarged abdominal or pelvic lymph nodes. Reproductive: Mild prostatomegaly, measuring up to 5.1 cm. Other: No abdominal wall hernia or abnormality. No abdominopelvic ascites. Musculoskeletal: Anterior abdominal wall soft tissue contusions, likely injection sites. No interval osseous findings. IMPRESSION: Gaseous distension of colon, greatest at cecum and extending to sigmoid. No discrete large bowel obstruction, free air or evidence of vascular compromise. 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 Attention on follow-up. Electronically Signed   By: Roanna Banning M.D.   On: 06/12/2021 09:08     Medications   Scheduled Meds:  arformoterol  15 mcg Nebulization BID   atorvastatin  40 mg Per Tube QHS   budesonide (PULMICORT) nebulizer solution  0.5 mg Nebulization BID   chlorhexidine  15 mL Mouth Rinse BID   clonazePAM  0.5 mg Per Tube Q6H   cyclobenzaprine  5 mg Per Tube Q8H   docusate  200 mg Per Tube BID   feeding supplement (PROSource TF)  45 mL Per Tube BID   heparin  5,000 Units Subcutaneous Q8H   mouth rinse  15 mL Mouth Rinse q12n4p   metoprolol tartrate  2.5 mg Intravenous Q8H   oxyCODONE  2.5 mg Per Tube Q6H   pantoprazole sodium  40 mg Per Tube QHS   polyethylene glycol  17 g Per Tube Daily   propranolol  20 mg Per Tube Q4H   revefenacin  175 mcg Nebulization Daily    senna  1 tablet Per Tube Daily   Continuous Infusions:  sodium chloride 250 mL (06/13/21 0755)   dextrose 100 mL/hr at 06/13/21 1729   feeding supplement (JEVITY 1.2 CAL) Stopped (06/12/21 0750)   levETIRAcetam 500 mg (06/13/21 0756)   meropenem (MERREM) IV 1 g (06/13/21 1716)       LOS: 45 days      Briant Cedar, MD [facility]  06/13/2021, 5:45 PM

## 2021-06-13 NOTE — Progress Notes (Signed)
Daily Progress Note   Patient Name: Vincent [name]       Date: 06/13/2021 DOB: 1971/01/11  Age: 50 y.o. MRN#: [id number] Attending Physician: Briant Cedar, MD Primary Care Physician: Pcp, No Admit Date: 04/29/2021  Reason for Consultation/Follow-up: Establishing goals of care  Subjective: Medical records reviewed. Patient assessed at the bedside. He is unresponsive. No family present during my evaluation.  I called patient's sister Clarise Cruz to provide updates and follow up on goals of care conversation. She shares that she has had the opportunity to discuss with medical team yesterday and states "I made it very clear that I will not be withdrawing support from the ventilator." She continues to feel strongly that it is not her place to make these decisions but rather God will ultimately decide patient's fate.   I shared my concern that patient's symptoms (fevers, tachypnea, tachycardia, rigidity, extreme sensitivity to stimulation) may be due to his initial anoxic brain injury. Explained "fight or flight" response, dysregulation of the nervous system, and my worry that Kollyn is at high risk to continue suffering from this for a prolonged period, despite treatments for sepsis and other complications. [name] verbalizes her understanding and plans to inform her siblings, however this does not change goals of care.  Questions and concerns addressed. Family was encouraged to call with additional needs or concerns. PMT will continue to support holistically.     Length of Stay: 45  Current Medications: Scheduled Meds:  . arformoterol  15 mcg Nebulization BID  . atorvastatin  40 mg Per Tube QHS  . budesonide (PULMICORT) nebulizer solution  0.5 mg Nebulization BID  . chlorhexidine  15 mL  Mouth Rinse BID  . clonazePAM  0.5 mg Per Tube Q6H  . cyclobenzaprine  5 mg Per Tube Q8H  . docusate  200 mg Per Tube BID  . feeding supplement (PROSource TF)  45 mL Per Tube BID  . heparin  5,000 Units Subcutaneous Q8H  . mouth rinse  15 mL Mouth Rinse q12n4p  . metoprolol tartrate  2.5 mg Intravenous Q8H  . oxyCODONE  2.5 mg Per Tube Q6H  . pantoprazole sodium  40 mg Per Tube QHS  . polyethylene glycol  17 g Per Tube Daily  . propranolol  20 mg Per Tube Q4H  . revefenacin  175 mcg  Nebulization Daily  . senna  1 tablet Per Tube Daily    Continuous Infusions: . sodium chloride 250 mL (06/13/21 0755)  . dextrose 100 mL/hr at 06/13/21 0804  . feeding supplement (JEVITY 1.2 CAL) Stopped (06/12/21 0750)  . levETIRAcetam 500 mg (06/13/21 0756)  . meropenem (MERREM) IV 1 g (06/13/21 0959)    PRN Meds: acetaminophen, albuterol, glycopyrrolate, midazolam, polyethylene glycol  Physical Exam Vitals and nursing note reviewed.  Constitutional:      General: He is not in acute distress.    Appearance: He is ill-appearing.     Comments: Trach collar  Cardiovascular:     Rate and Rhythm: Tachycardia present.  Pulmonary:     Effort: Tachypnea present.  Neurological:     Mental Status: He is unresponsive.            Vital Signs: BP 121/82   Pulse (!) 117   Temp 99.5 F (37.5 C) (Axillary)   Resp (!) 28   Ht 5\' 8"  (1.727 m)   Wt 63.2 kg   SpO2 100%   BMI 21.20 kg/m  SpO2: SpO2: 100 % O2 Device: O2 Device: Tracheostomy Collar O2 Flow Rate: O2 Flow Rate (L/min): 5 L/min  Intake/output summary:  Intake/Output Summary (Last 24 hours) at 06/13/2021 1530 Last data filed at 06/13/2021 1335 Gross per 24 hour  Intake 400 ml  Output 951 ml  Net -551 ml    LBM: Last BM Date: 06/12/21 Baseline Weight: Weight: 70 kg Most recent weight: Weight: 63.2 kg       Palliative Assessment/Data: 10%, on TF      Patient Active Problem List   Diagnosis Date Noted  . AKI (acute kidney  injury) (HCC)   . Goals of care, counseling/discussion   . Tracheostomy in place [facility])   . Anoxic encephalopathy (HCC)   . Seizure-like activity (HCC)   . Acute respiratory failure with hypoxia (HCC)   . Cardiac arrest [facility]) 04/29/2021    Palliative Care Assessment & Plan   Patient Profile: 50 y.o. male  with past medical history of CVA in 2016, poorly controlled DM2, emphysema, and tobacco abuse admitted on 04/29/2021 with PEA arrest at home, likely respiratory driven.    Patient received CPR x6 minutes and epi x1, ROSC, intubated in the ED, admitted to [facility] He developed acute systolic/diastolic CHF after cardiac arrest. Keppra and depakote were started for myoclonic jerking and patient remains encephalopathic. Currently s/p PEG/trach with moderate free air on 8/25 CT of the abdomen, likely due to PEG placement. Palliative medicine has been consulted to assist with goals of care conversation.    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 Failure to wean from ventilator s/p tracheostomy Sepsis AKI Acute combined systolic/diastolic CHF Goals of care conversation  Recommendations/Plan: DNR Patient's sister remains very resistant to withdrawal of ventilator support or transition to comfort care Would recommend aggressive symptom management and avoiding potentially inappropriate treatments  PMT will continue to follow peripherally and support family as needed  Goals of Care and Additional Recommendations: Limitations on Scope of Treatment: Full Scope Treatment  Prognosis:  Poor prognosis given acute decline s/p anoxic brain injury, multiple comorbidities   Discharge Planning: To Be Determined  Care plan was discussed with Patient's sister [name]  Total time: 15 minutes Greater than 50% of this time was spent in counseling and coordinating care related to the above assessment and plan.  9/25, PA-C Palliative Medicine  Team Team phone #  [phone]  Thank you for allowing the Palliative Medicine Team to assist in the care of this patient. Please utilize secure chat with additional questions, if there is no response within 30 minutes please call the above phone number.  Palliative Medicine Team providers are available by phone from 7am to 7pm daily and can be reached through the team cell phone.  Should this patient require assistance outside of these hours, please call the patient's attending physician.

## 2021-06-14 DIAGNOSIS — J9601 Acute respiratory failure with hypoxia: Secondary | ICD-10-CM | POA: Diagnosis not present

## 2021-06-14 DIAGNOSIS — I469 Cardiac arrest, cause unspecified: Secondary | ICD-10-CM | POA: Diagnosis not present

## 2021-06-14 DIAGNOSIS — R569 Unspecified convulsions: Secondary | ICD-10-CM | POA: Diagnosis not present

## 2021-06-14 DIAGNOSIS — G931 Anoxic brain damage, not elsewhere classified: Secondary | ICD-10-CM | POA: Diagnosis not present

## 2021-06-14 LAB — CBC WITH DIFFERENTIAL/PLATELET
Abs Immature Granulocytes: 0.18 10*3/uL — ABNORMAL HIGH (ref 0.00–0.07)
Basophils Absolute: 0.1 10*3/uL (ref 0.0–0.1)
Basophils Relative: 0 %
Eosinophils Absolute: 0.1 10*3/uL (ref 0.0–0.5)
Eosinophils Relative: 1 %
HCT: 37.9 % — ABNORMAL LOW (ref 39.0–52.0)
Hemoglobin: 12.1 g/dL — ABNORMAL LOW (ref 13.0–17.0)
Immature Granulocytes: 1 %
Lymphocytes Relative: 28 %
Lymphs Abs: 4.1 10*3/uL — ABNORMAL HIGH (ref 0.7–4.0)
MCH: 29 pg (ref 26.0–34.0)
MCHC: 31.9 g/dL (ref 30.0–36.0)
MCV: 90.9 fL (ref 80.0–100.0)
Monocytes Absolute: 1 10*3/uL (ref 0.1–1.0)
Monocytes Relative: 7 %
Neutro Abs: 9.4 10*3/uL — ABNORMAL HIGH (ref 1.7–7.7)
Neutrophils Relative %: 63 %
Platelets: 267 10*3/uL (ref 150–400)
RBC: 4.17 MIL/uL — ABNORMAL LOW (ref 4.22–5.81)
RDW: 15.2 % (ref 11.5–15.5)
WBC: 14.7 10*3/uL — ABNORMAL HIGH (ref 4.0–10.5)
nRBC: 0.3 % — ABNORMAL HIGH (ref 0.0–0.2)

## 2021-06-14 LAB — BASIC METABOLIC PANEL
Anion gap: 10 (ref 5–15)
BUN: 44 mg/dL — ABNORMAL HIGH (ref 6–20)
CO2: 25 mmol/L (ref 22–32)
Calcium: 9.3 mg/dL (ref 8.9–10.3)
Chloride: 110 mmol/L (ref 98–111)
Creatinine, Ser: 1.02 mg/dL (ref 0.61–1.24)
GFR, Estimated: >60 mL/min (ref >60)
Glucose, Bld: 148 mg/dL — ABNORMAL HIGH (ref 70–99)
Potassium: 4.1 mmol/L (ref 3.5–5.1)
Sodium: 145 mmol/L (ref 135–145)

## 2021-06-14 LAB — GLUCOSE, CAPILLARY
Glucose-Capillary: 116 mg/dL — ABNORMAL HIGH (ref 70–99)
Glucose-Capillary: 127 mg/dL — ABNORMAL HIGH (ref 70–99)
Glucose-Capillary: 143 mg/dL — ABNORMAL HIGH (ref 70–99)
Glucose-Capillary: 146 mg/dL — ABNORMAL HIGH (ref 70–99)
Glucose-Capillary: 149 mg/dL — ABNORMAL HIGH (ref 70–99)
Glucose-Capillary: 174 mg/dL — ABNORMAL HIGH (ref 70–99)

## 2021-06-14 MED ORDER — DEXTROSE 5 % IV SOLN: INTRAVENOUS | Status: AC

## 2021-06-14 MED ORDER — MIDAZOLAM HCL 2 MG/2ML IJ SOLN: 2.0000 mg | Freq: Four times a day (QID) | INTRAMUSCULAR | Status: DC | PRN

## 2021-06-14 MED ORDER — CLONAZEPAM 0.5 MG PO TABS: 0.5000 mg | ORAL_TABLET | Freq: Three times a day (TID) | ORAL | Status: DC

## 2021-06-14 NOTE — Progress Notes (Signed)
Pharmacy Antibiotic Note  Vincent [name] is a 50 y.o. male admitted on 04/29/2021 with PEA arrest. Pt has had complicated hospital course. Currently on trach collar, concern for recurrent PNA. Pharmacy was been consulted for meropenem dosing after ESBL Klebsiella pneumoniae grew from the patient's trach.  WBC are trending down, currently 14.7. Fever also trending down with a Tmax of 99.5. Scr 1.02  Plan: -Meropenem 1gm IV q8h -Will follow renal function, cultures and clinical progress  Height: 5\' 8"  (172.7 cm) Weight: 63.2 kg (139 lb 6.4 oz) IBW/kg (Calculated) : 68.4  Temp (24hrs), Avg:98.7 F (37.1 C), Min:97.6 F (36.4 C), Max:99.5 F (37.5 C)  Recent Labs  Lab 06/09/21 0910 06/09/21 1146 06/10/21 0157 06/10/21 2100 06/11/21 0242 06/12/21 0343 06/12/21 1101 06/12/21 1409 06/13/21 0125 06/14/21 0322  WBC  --   --  25.9*  --  28.2* 28.1*  --   --  26.7* 14.7*  CREATININE  --   --  1.32*  --  1.46* 1.45*  --   --  1.37* 1.02  LATICACIDVEN 1.9 2.8*  --  1.5  --   --  1.3 1.4  --   --      Estimated Creatinine Clearance: 76.6 mL/min (by C-G formula based on SCr of 1.02 mg/dL).    No Known Allergies  Antimicrobials this admission: CTX 8/3>>8/7 Augmentin 8/8 >> 8/9 Zosyn 8/18>> 8/19; 8/25>>8/26; 9/12 >> 9/13 Unasyn 8/19 >>8/22 Fluconazole 8/25>>9/3 Vanc 9/1 x1 Cefepime 9/1 x1 Azithromycin 9/8>>9/12 Meropenem 9/13>>  Microbiology results: 8/2 MRSA PCR negative 8/2 COVID/Flu negative 8/18 TA >> few staph epi (ox resistant) - likely contaminant 9/1 resp cx: few ESBL Klebsiella pneumoniae, few staph aureus (s aureus pan-S, Klebsiella S to zosyn and imipenem) 9/1 bcx: ngF 9/12 trach- ESBL Klebsiella pneumoniae, staph aureus\ 9/13 Bcx: NGTD  10/13, Pharm.D. PGY-1 Pharmacy Resident Phone:[phone] 06/14/2021 10:35 AM

## 2021-06-14 NOTE — Progress Notes (Signed)
PROGRESS NOTE    Vincent [name]   NOB:[id number]  DOB: 06/20/1971  PCP: Pcp, No    DOA: 04/29/2021 LOS: 46    Brief Narrative / Hospital Course to Date:   50 yo male smoker who was admitted to ICU after PEA cardiac arrest with ROSC after 6 minutes of resuscitation.  CT head showed an old infarct, nothing acute.  CT chest with bilateral dense lower lobe consolidations with emphysematous changes.   Complicated hospital course as follows, per PCCM. TRH assumed care 06/07/21.   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 / Anoxic brain injury 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 Continue Klonopin, Flexeril, Keppra once able Midazolam PRN abnormal muscle movements  Acute respiratory failure with hypoxia due to Cardiac Arrest and Aspiration Pneumonia Failure to wean from ventilator s/p tracheostomy Emphysema, centrilobular PCCM continues to follow for trach management Supplement O2 to maintain sat >90% Continue Yupelri, Brovana, Pulmnicort, PRN albuterol Completed 5 day course Prednisone and 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 Follow up imaging in ~3 months if patient survives hospital course  Hyperlipidemia Lipitor  Goals of care discussion Patient with persistent encephalopathy, guarded prognosis Palliative care reconsulted for further discussions of goals of care DNR    Patient BMI: Body mass index is 21.2 kg/m.   DVT prophylaxis: heparin injection 5,000 Units Start: 04/29/21 2230 SCDs Start: 04/29/21 2227   Diet:     Code Status: DNR   Subjective 06/14/21   Patient appears stable, although still critically ill. Abdomen less distended after PEG was placed to gravity. No longer febrile    Disposition Plan & Communication   Status is: Inpatient  Remains inpatient appropriate because:Inpatient level of care appropriate due to severity of illness.  Requires placement, limited options due to insurance and trach/peg status.    Dispo: The patient is from: Home              Anticipated d/c is to: SNF vs LTAC?  Patient currently is not medically stable to d/c.   Difficult to place patient Yes    Consults, Procedures, Significant Events   Consultants:  PCCM  Procedures:  As above "significant events"  Antimicrobials:  Anti-infectives  (From admission, onward)    Start     Dose/Rate Route Frequency Ordered Stop   06/10/21 1000  meropenem (MERREM) 1 g in sodium chloride 0.9 % 100 mL IVPB        1 g 200 mL/hr over 30 Minutes Intravenous Every 8 hours 06/10/21 0900     06/09/21 1045  piperacillin-tazobactam (ZOSYN) IVPB 3.375 g  Status:  Discontinued        3.375 g 12.5 mL/hr over 240 Minutes Intravenous Every 8 hours 06/09/21 0959 06/10/21 0840   06/06/21 1000  azithromycin (ZITHROMAX) tablet 250 mg  Status:  Discontinued       See Hyperspace for full Linked Orders Report.   250 mg Oral Daily 06/05/21 1014 06/05/21 1629   06/06/21 1000  azithromycin (ZITHROMAX) tablet 250 mg       See Hyperspace for full Linked Orders Report.   250 mg Per Tube Daily 06/05/21 1629 06/09/21 1007   06/05/21 1100  azithromycin (ZITHROMAX) tablet 500 mg       See Hyperspace for full Linked Orders Report.   500 mg Oral  Once 06/05/21 1014 06/05/21 1124   05/30/21 1530  vancomycin (VANCOREADY) IVPB 1250 mg/250 mL  Status:  Discontinued        1,250 mg 166.7 mL/hr over 90 Minutes Intravenous Every 24 hours 05/29/21 1504 05/30/21 1028   05/30/21 0100  ceFEPIme (MAXIPIME) 2 g in sodium chloride 0.9 % 100 mL IVPB  Status:  Discontinued        2 g 200 mL/hr over 30 Minutes Intravenous Every 12 hours 05/29/21 1504 05/30/21 1028   05/29/21 1430  ceFEPIme (MAXIPIME) 2 g in sodium chloride 0.9 % 100 mL IVPB        2 g 200 mL/hr over 30 Minutes Intravenous  Once 05/29/21 1331 05/29/21 1614   05/29/21 1430  vancomycin (VANCOREADY) IVPB 1500 mg/300 mL        1,500 mg 150 mL/hr over 120 Minutes Intravenous  Once 05/29/21 1331 05/29/21 1814   05/23/21 1000  fluconazole (DIFLUCAN) tablet 100 mg       See Hyperspace for full Linked Orders Report.   100 mg Per Tube Daily 05/22/21 1043 05/31/21 0940   05/22/21 2000  piperacillin-tazobactam (ZOSYN) IVPB 3.375 g  Status:  Discontinued        3.375 g 12.5 mL/hr over 240 Minutes Intravenous Every 8 hours  05/22/21 1227 05/23/21 1017   05/22/21 1315  piperacillin-tazobactam (ZOSYN) IVPB 3.375 g        3.375 g 100 mL/hr over 30 Minutes Intravenous  Once 05/22/21 1227 05/22/21 1404   05/22/21 1130  fluconazole (DIFLUCAN) tablet 100 mg  Status:  Discontinued        100 mg Per Tube Daily 05/22/21 1035 05/22/21 1042   05/22/21 1130  fluconazole (DIFLUCAN) tablet 200 mg       See Hyperspace for full Linked Orders Report.   200 mg Per Tube  Once 05/22/21 1043 05/22/21 1054   05/21/21 1112  ceFAZolin (ANCEF) IVPB 1 g/50 mL premix        over 30 Minutes  Continuous PRN 05/21/21 1120 05/21/21 1148   05/21/21 0600  ceFAZolin (ANCEF) IVPB 2g/100 mL premix        2  g 200 mL/hr over 30 Minutes Intravenous To Radiology 05/20/21 1318 05/21/21 1142   05/16/21 1800  Ampicillin-Sulbactam (UNASYN) 3 g in sodium chloride 0.9 % 100 mL IVPB  Status:  Discontinued        3 g 200 mL/hr over 30 Minutes Intravenous Every 6 hours 05/16/21 1107 05/19/21 1014   05/15/21 1100  piperacillin-tazobactam (ZOSYN) IVPB 3.375 g  Status:  Discontinued        3.375 g 12.5 mL/hr over 240 Minutes Intravenous Every 8 hours 05/15/21 1000 05/16/21 1107   05/05/21 1400  amoxicillin-clavulanate (AUGMENTIN) 875-125 MG per tablet 1 tablet        1 tablet Per Tube Every 12 hours 05/05/21 1358 05/06/21 1453   04/30/21 2000  ceFEPIme (MAXIPIME) 2 g in sodium chloride 0.9 % 100 mL IVPB  Status:  Discontinued        2 g 200 mL/hr over 30 Minutes Intravenous Every 12 hours 04/30/21 0814 04/30/21 0922   04/30/21 2000  cefTRIAXone (ROCEPHIN) 2 g in sodium chloride 0.9 % 100 mL IVPB  Status:  Discontinued        2 g 200 mL/hr over 30 Minutes Intravenous Every 24 hours 04/30/21 0922 05/05/21 1358   04/30/21 1015  azithromycin (ZITHROMAX) 500 mg in sodium chloride 0.9 % 250 mL IVPB  Status:  Discontinued        500 mg 250 mL/hr over 60 Minutes Intravenous Every 24 hours 04/30/21 0922 05/01/21 0925   04/30/21 0800  ceFEPIme (MAXIPIME) 2 g in  sodium chloride 0.9 % 100 mL IVPB  Status:  Discontinued        2 g 200 mL/hr over 30 Minutes Intravenous Every 8 hours 04/29/21 2239 04/30/21 0814   04/29/21 2359  vancomycin (VANCOREADY) IVPB 1500 mg/300 mL  Status:  Discontinued        1,500 mg 150 mL/hr over 120 Minutes Intravenous Daily at bedtime 04/29/21 2241 04/30/21 0922   04/29/21 2200  ceFEPIme (MAXIPIME) 2 g in sodium chloride 0.9 % 100 mL IVPB        2 g 200 mL/hr over 30 Minutes Intravenous  Once 04/29/21 2151 04/30/21 0048   04/29/21 2200  azithromycin (ZITHROMAX) 500 mg in sodium chloride 0.9 % 250 mL IVPB        500 mg 250 mL/hr over 60 Minutes Intravenous  Once 04/29/21 2151 04/30/21 0256         Micro    Objective   Vitals:   06/14/21 1100 06/14/21 1106 06/14/21 1500 06/14/21 1507  BP:  (!) 139/97  (!) 153/96  Pulse: (!) 106 (!) 106 (!) 109 (!) 110  Resp: (!) 23 15 (!) 31 20  Temp:  98.7 F (37.1 C)    TempSrc:  Axillary  (P) Axillary  SpO2: 100% 100% 100% 100%  Weight:      Height:        Intake/Output Summary (Last 24 hours) at 06/14/2021 1716 Last data filed at 06/14/2021 1203 Gross per 24 hour  Intake --  Output 1350 ml  Net -1350 ml   Filed Weights   06/03/21 0500 06/04/21 0500 06/08/21 0353  Weight: 68.3 kg 68.4 kg 63.2 kg    Physical Exam: General: Critically ill-appearing, unable to follow commands, unresponsive, appears to be awake Cardiovascular: S1, S2 present Respiratory: Diminished breath sounds bilaterally, increased work of breathing Abdomen: Soft, ??tender, +++distended, bowel sounds present, PEG in place Musculoskeletal: No bilateral pedal edema noted, offloading boots on bilateral feet Skin: Normal  Psychiatry: Unable to assess     Labs   Data Reviewed: I have personally reviewed following labs and imaging studies  CBC: Recent Labs  Lab 06/10/21 0157 06/11/21 0242 06/12/21 0343 06/13/21 0125 06/14/21 0322  WBC 25.9* 28.2* 28.1* 26.7* 14.7*  NEUTROABS  --   --   20.0* 14.2* 9.4*  HGB 13.6 14.0 13.6 13.3 12.1*  HCT 41.9 43.6 43.1 41.9 37.9*  MCV 88.0 89.7 90.7 90.9 90.9  PLT 474* 383 378 332 267   Basic Metabolic Panel: Recent Labs  Lab 06/08/21 0102 06/09/21 0350 06/10/21 0157 06/11/21 0242 06/12/21 0343 06/13/21 0125 06/14/21 0322  NA 135   < > 141 144 148* 152* 145  K 4.6   < > 4.5 4.7 3.5 3.9 4.1  CL 94*   < > 100 103 113* 113* 110  CO2 27   < > 28 24 23 25 25   GLUCOSE 106*   < > 140* 131* 215* 154* 148*  BUN 36*   < > 51* 52* 53* 63* 44*  CREATININE 0.82   < > 1.32* 1.46* 1.45* 1.37* 1.02  CALCIUM 10.6*   < > 10.9* 10.5* 9.8 9.9 9.3  MG 2.2  --   --   --   --   --   --   PHOS 5.8*  --   --   --   --   --   --    < > = values in this interval not displayed.   GFR: Estimated Creatinine Clearance: 76.6 mL/min (by C-G formula based on SCr of 1.02 mg/dL). Liver Function Tests: Recent Labs  Lab 06/10/21 0157  AST 54*  ALT 98*  ALKPHOS 61  BILITOT 0.6  PROT 8.7*  ALBUMIN 4.0   No results for input(s): LIPASE, AMYLASE in the last 168 hours. No results for input(s): AMMONIA in the last 168 hours. Coagulation Profile: No results for input(s): INR, PROTIME in the last 168 hours. Cardiac Enzymes: Recent Labs  Lab 06/09/21 0910  CKTOTAL 183   BNP (last 3 results) No results for input(s): PROBNP in the last 8760 hours. HbA1C: No results for input(s): HGBA1C in the last 72 hours. CBG: Recent Labs  Lab 06/14/21 0010 06/14/21 0415 06/14/21 0736 06/14/21 1132 06/14/21 1543  GLUCAP 174* 149* 146* 143* 127*   Lipid Profile: No results for input(s): CHOL, HDL, LDLCALC, TRIG, CHOLHDL, LDLDIRECT in the last 72 hours. Thyroid Function Tests: No results for input(s): TSH, T4TOTAL, FREET4, T3FREE, THYROIDAB in the last 72 hours. Anemia Panel: No results for input(s): VITAMINB12, FOLATE, FERRITIN, TIBC, IRON, RETICCTPCT in the last 72 hours. Sepsis Labs: Recent Labs  Lab 06/09/21 0350 06/09/21 0910 06/09/21 1146  06/10/21 0157 06/10/21 2100 06/12/21 1101 06/12/21 1409  PROCALCITON <0.10  --   --  0.13  --   --   --   LATICACIDVEN  --    < > 2.8*  --  1.5 1.3 1.4   < > = values in this interval not displayed.    Recent Results (from the past 240 hour(s))  Culture, Respiratory w Gram Stain     Status: None   Collection Time: 06/09/21  9:28 AM   Specimen: Tracheal Aspirate; Respiratory  Result Value Ref Range Status   Specimen Description TRACHEAL ASPIRATE  Final   Special Requests NONE  Final   Gram Stain   Final    FEW SQUAMOUS EPITHELIAL CELLS PRESENT FEW WBC SEEN ABUNDANT GRAM POSITIVE COCCI    Culture  Final    RARE KLEBSIELLA PNEUMONIAE ABUNDANT STAPHYLOCOCCUS AUREUS Confirmed Extended Spectrum Beta-Lactamase Producer (ESBL).  In bloodstream infections from ESBL organisms, carbapenems are preferred over piperacillin/tazobactam. They are shown to have a lower risk of mortality. FOR KLEBSIELLA PNEUMONIAE Performed at [facility], [address] [place], [place] [address]    Report Status 06/11/2021 FINAL  Final   Organism ID, Bacteria KLEBSIELLA PNEUMONIAE  Final   Organism ID, Bacteria STAPHYLOCOCCUS AUREUS  Final      Susceptibility   Klebsiella pneumoniae - MIC*    AMPICILLIN >=32 RESISTANT Resistant     CEFAZOLIN >=64 RESISTANT Resistant     CEFEPIME >=32 RESISTANT Resistant     CEFTAZIDIME RESISTANT Resistant     CEFTRIAXONE >=64 RESISTANT Resistant     CIPROFLOXACIN >=4 RESISTANT Resistant     GENTAMICIN >=16 RESISTANT Resistant     IMIPENEM <=0.25 SENSITIVE Sensitive     TRIMETH/SULFA >=320 RESISTANT Resistant     AMPICILLIN/SULBACTAM >=32 RESISTANT Resistant     PIP/TAZO 16 SENSITIVE Sensitive     * RARE KLEBSIELLA PNEUMONIAE   Staphylococcus aureus - MIC*    CIPROFLOXACIN <=0.5 SENSITIVE Sensitive     ERYTHROMYCIN <=0.25 SENSITIVE Sensitive     GENTAMICIN <=0.5 SENSITIVE Sensitive     OXACILLIN 0.5 SENSITIVE Sensitive     TETRACYCLINE <=1 SENSITIVE  Sensitive     VANCOMYCIN <=0.5 SENSITIVE Sensitive     TRIMETH/SULFA <=10 SENSITIVE Sensitive     CLINDAMYCIN <=0.25 SENSITIVE Sensitive     RIFAMPIN <=0.5 SENSITIVE Sensitive     Inducible Clindamycin NEGATIVE Sensitive     * ABUNDANT STAPHYLOCOCCUS AUREUS  Culture, blood (routine x 2)     Status: None (Preliminary result)   Collection Time: 06/10/21  9:20 AM   Specimen: BLOOD  Result Value Ref Range Status   Specimen Description BLOOD RIGHT ANTECUBITAL  Final   Special Requests   Final    BOTTLES DRAWN AEROBIC AND ANAEROBIC Blood Culture adequate volume   Culture   Final    NO GROWTH 4 DAYS Performed at [facility], [address], [place], [place] [address]    Report Status PENDING  Incomplete  Culture, blood (routine x 2)     Status: None (Preliminary result)   Collection Time: 06/10/21  9:20 AM   Specimen: BLOOD LEFT HAND  Result Value Ref Range Status   Specimen Description BLOOD LEFT HAND  Final   Special Requests   Final    BOTTLES DRAWN AEROBIC AND ANAEROBIC Blood Culture adequate volume   Culture   Final    NO GROWTH 4 DAYS Performed at [facility], [address] [place], [place] [address]    Report Status PENDING  Incomplete       Imaging Studies   No results found.   Medications   Scheduled Meds:  arformoterol  15 mcg Nebulization BID   atorvastatin  40 mg Per Tube QHS   budesonide (PULMICORT) nebulizer solution  0.5 mg Nebulization BID   chlorhexidine  15 mL Mouth Rinse BID   clonazePAM  0.5 mg Per Tube Q8H   docusate  200 mg Per Tube BID   feeding supplement (PROSource TF)  45 mL Per Tube BID   heparin  5,000 Units Subcutaneous Q8H   mouth rinse  15 mL Mouth Rinse q12n4p   oxyCODONE  2.5 mg Per Tube Q6H   pantoprazole sodium  40 mg Per Tube QHS   polyethylene glycol  17  g Per Tube Daily   propranolol  20 mg Per Tube Q4H   revefenacin  175 mcg Nebulization Daily   senna  1 tablet Per Tube Daily   Continuous Infusions:  sodium  chloride 250 mL (06/13/21 0755)   dextrose 75 mL/hr at 06/14/21 1030   feeding supplement (JEVITY 1.2 CAL) Stopped (06/12/21 0750)   levETIRAcetam 500 mg (06/14/21 8101)   meropenem (MERREM) IV 1 g (06/14/21 1034)       LOS: 46 days      Briant Cedar, MD [facility]  06/14/2021, 5:16 PM

## 2021-06-15 ENCOUNTER — Other Ambulatory Visit: Payer: Self-pay

## 2021-06-15 DIAGNOSIS — G931 Anoxic brain damage, not elsewhere classified: Secondary | ICD-10-CM | POA: Diagnosis not present

## 2021-06-15 DIAGNOSIS — J9601 Acute respiratory failure with hypoxia: Secondary | ICD-10-CM | POA: Diagnosis not present

## 2021-06-15 DIAGNOSIS — I469 Cardiac arrest, cause unspecified: Secondary | ICD-10-CM | POA: Diagnosis not present

## 2021-06-15 DIAGNOSIS — R569 Unspecified convulsions: Secondary | ICD-10-CM | POA: Diagnosis not present

## 2021-06-15 LAB — CBC WITH DIFFERENTIAL/PLATELET
Abs Immature Granulocytes: 0.12 10*3/uL — ABNORMAL HIGH (ref 0.00–0.07)
Basophils Absolute: 0 10*3/uL (ref 0.0–0.1)
Basophils Relative: 0 %
Eosinophils Absolute: 0.2 10*3/uL (ref 0.0–0.5)
Eosinophils Relative: 1 %
HCT: 38.9 % — ABNORMAL LOW (ref 39.0–52.0)
Hemoglobin: 12.3 g/dL — ABNORMAL LOW (ref 13.0–17.0)
Immature Granulocytes: 1 %
Lymphocytes Relative: 24 %
Lymphs Abs: 3.4 10*3/uL (ref 0.7–4.0)
MCH: 28.3 pg (ref 26.0–34.0)
MCHC: 31.6 g/dL (ref 30.0–36.0)
MCV: 89.4 fL (ref 80.0–100.0)
Monocytes Absolute: 0.9 10*3/uL (ref 0.1–1.0)
Monocytes Relative: 6 %
Neutro Abs: 9.8 10*3/uL — ABNORMAL HIGH (ref 1.7–7.7)
Neutrophils Relative %: 68 %
Platelets: 291 10*3/uL (ref 150–400)
RBC: 4.35 MIL/uL (ref 4.22–5.81)
RDW: 14.6 % (ref 11.5–15.5)
WBC: 14.4 10*3/uL — ABNORMAL HIGH (ref 4.0–10.5)
nRBC: 0.1 % (ref 0.0–0.2)

## 2021-06-15 LAB — GLUCOSE, CAPILLARY
Glucose-Capillary: 110 mg/dL — ABNORMAL HIGH (ref 70–99)
Glucose-Capillary: 117 mg/dL — ABNORMAL HIGH (ref 70–99)
Glucose-Capillary: 124 mg/dL — ABNORMAL HIGH (ref 70–99)
Glucose-Capillary: 131 mg/dL — ABNORMAL HIGH (ref 70–99)
Glucose-Capillary: 134 mg/dL — ABNORMAL HIGH (ref 70–99)
Glucose-Capillary: 134 mg/dL — ABNORMAL HIGH (ref 70–99)

## 2021-06-15 LAB — BASIC METABOLIC PANEL
Anion gap: 10 (ref 5–15)
BUN: 27 mg/dL — ABNORMAL HIGH (ref 6–20)
CO2: 22 mmol/L (ref 22–32)
Calcium: 9.8 mg/dL (ref 8.9–10.3)
Chloride: 108 mmol/L (ref 98–111)
Creatinine, Ser: 0.86 mg/dL (ref 0.61–1.24)
GFR, Estimated: >60 mL/min (ref >60)
Glucose, Bld: 122 mg/dL — ABNORMAL HIGH (ref 70–99)
Potassium: 4.2 mmol/L (ref 3.5–5.1)
Sodium: 140 mmol/L (ref 135–145)

## 2021-06-15 LAB — CULTURE, BLOOD (ROUTINE X 2)
Culture: NO GROWTH
Culture: NO GROWTH
Special Requests: ADEQUATE
Special Requests: ADEQUATE

## 2021-06-15 MED ORDER — DEXTROSE 5 % IV SOLN: INTRAVENOUS | Status: DC

## 2021-06-15 NOTE — Progress Notes (Signed)
PROGRESS NOTE    Vincent [name]   JOA:[id number]  DOB: 1971/06/09  PCP: Pcp, No    DOA: 04/29/2021 LOS: 47    Brief Narrative / Hospital Course to Date:   50 yo male smoker who was admitted to ICU after PEA cardiac arrest with ROSC after 6 minutes of resuscitation.  CT head showed an old infarct, nothing acute.  CT chest with bilateral dense lower lobe consolidations with emphysematous changes.   Complicated hospital course as follows, per PCCM. TR[facility] assumed care 06/07/21.   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 / Anoxic brain injury 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 Continue Klonopin, Flexeril, Keppra once able  Acute respiratory failure with hypoxia due to Cardiac Arrest and Aspiration Pneumonia Failure to wean from ventilator s/p tracheostomy Emphysema, centrilobular PCCM continues to follow for trach management Supplement O2 to maintain sat >90% Continue Yupelri, Brovana, Pulmnicort, PRN albuterol Completed 5 day course Prednisone and 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  Severe Sepsis due to ESBL Klebsiella on sputum culture Sepsis criteria on 9/12-13: fever, leukocytosis, lactic acidosis, tachycardia PCCM following Repeat cultures growing Klebsiella, MSSA Continue Merrem for a total of 7 days  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) Likely reason for significant bowel distension CT abdomen showed above General surgery consulted- appreciate recs Avoid  narcotics, sedatives for now, bowel rest, npo, PEG drained to gravity  AKI Resolved Daily BMP  Hypernatremia IVF- D5W Daily BMP  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 Follow up imaging in ~3 months if patient survives hospital course  Hyperlipidemia Lipitor  Goals of care discussion Patient with persistent encephalopathy, guarded prognosis Palliative care reconsulted for further discussions of goals of care DNR    Patient BMI: Body mass index is 21.2 kg/m.   DVT prophylaxis: heparin injection 5,000 Units Start: 04/29/21 2230 SCDs Start: 04/29/21 2227   Diet:     Code Status: DNR   Subjective 06/15/21   Patient appears stable, although still critically ill    Disposition Plan & Communication   Status is: Inpatient  Remains inpatient appropriate because:Inpatient level of care appropriate due to severity of illness.  Requires placement, limited options due to insurance and trach/peg status.    Dispo: The patient is from: Home              Anticipated d/c is to: SNF vs LTAC?              Patient currently is not medically stable to d/c.   Difficult to place patient Yes    Consults, Procedures, Significant Events   Consultants:  PCCM  Procedures:  As above "significant events"  Antimicrobials:  Anti-infectives (From admission, onward)    Start     Dose/Rate Route Frequency Ordered Stop   06/10/21 1000  meropenem (MERREM) 1 g in sodium chloride 0.9 % 100 mL IVPB        1 g 200 mL/hr over 30 Minutes Intravenous Every 8 hours  06/10/21 0900     06/09/21 1045  piperacillin-tazobactam (ZOSYN) IVPB 3.375 g  Status:  Discontinued        3.375 g 12.5 mL/hr over 240 Minutes Intravenous Every 8 hours 06/09/21 0959 06/10/21 0840   06/06/21 1000  azithromycin (ZITHROMAX) tablet 250 mg  Status:  Discontinued       See Hyperspace for full Linked Orders Report.   250 mg Oral Daily 06/05/21 1014 06/05/21 1629   06/06/21 1000  azithromycin (ZITHROMAX) tablet 250 mg       See Hyperspace for full Linked Orders Report.   250 mg Per Tube Daily 06/05/21 1629 06/09/21 1007   06/05/21 1100  azithromycin (ZITHROMAX) tablet 500 mg       See Hyperspace for full Linked Orders Report.   500 mg Oral  Once 06/05/21 1014 06/05/21 1124   05/30/21 1530  vancomycin (VANCOREADY) IVPB 1250 mg/250 mL  Status:  Discontinued        1,250 mg 166.7 mL/hr over 90 Minutes Intravenous Every 24 hours 05/29/21 1504 05/30/21 1028   05/30/21 0100  ceFEPIme (MAXIPIME) 2 g in sodium chloride 0.9 % 100 mL IVPB  Status:  Discontinued        2 g 200 mL/hr over 30 Minutes Intravenous Every 12 hours 05/29/21 1504 05/30/21 1028   05/29/21 1430  ceFEPIme (MAXIPIME) 2 g in sodium chloride 0.9 % 100 mL IVPB        2 g 200 mL/hr over 30 Minutes Intravenous  Once 05/29/21 1331 05/29/21 1614   05/29/21 1430  vancomycin (VANCOREADY) IVPB 1500 mg/300 mL        1,500 mg 150 mL/hr over 120 Minutes Intravenous  Once 05/29/21 1331 05/29/21 1814   05/23/21 1000  fluconazole (DIFLUCAN) tablet 100 mg       See Hyperspace for full Linked Orders Report.   100 mg Per Tube Daily 05/22/21 1043 05/31/21 0940   05/22/21 2000  piperacillin-tazobactam (ZOSYN) IVPB 3.375 g  Status:  Discontinued        3.375 g 12.5 mL/hr over 240 Minutes Intravenous Every 8 hours 05/22/21 1227 05/23/21 1017   05/22/21 1315  piperacillin-tazobactam (ZOSYN) IVPB 3.375 g        3.375 g 100 mL/hr over 30 Minutes Intravenous  Once 05/22/21 1227 05/22/21 1404   05/22/21 1130  fluconazole (DIFLUCAN) tablet  100 mg  Status:  Discontinued        100 mg Per Tube Daily 05/22/21 1035 05/22/21 1042   05/22/21 1130  fluconazole (DIFLUCAN) tablet 200 mg       See Hyperspace for full Linked Orders Report.   200 mg Per Tube  Once 05/22/21 1043 05/22/21 1054   05/21/21 1112  ceFAZolin (ANCEF) IVPB 1 g/50 mL premix        over 30 Minutes  Continuous PRN 05/21/21 1120 05/21/21 1148   05/21/21 0600  ceFAZolin (ANCEF) IVPB 2g/100 mL premix        2 g 200 mL/hr over 30 Minutes Intravenous To Radiology 05/20/21 1318 05/21/21 1142   05/16/21 1800  Ampicillin-Sulbactam (UNASYN) 3 g in sodium chloride 0.9 %  100 mL IVPB  Status:  Discontinued        3 g 200 mL/hr over 30 Minutes Intravenous Every 6 hours 05/16/21 1107 05/19/21 1014   05/15/21 1100  piperacillin-tazobactam (ZOSYN) IVPB 3.375 g  Status:  Discontinued        3.375 g 12.5 mL/hr over 240 Minutes Intravenous Every 8 hours 05/15/21 1000 05/16/21 1107   05/05/21 1400  amoxicillin-clavulanate (AUGMENTIN) 875-125 MG per tablet 1 tablet        1 tablet Per Tube Every 12 hours 05/05/21 1358 05/06/21 1453   04/30/21 2000  ceFEPIme (MAXIPIME) 2 g in sodium chloride 0.9 % 100 mL IVPB  Status:  Discontinued        2 g 200 mL/hr over 30 Minutes Intravenous Every 12 hours 04/30/21 0814 04/30/21 0922   04/30/21 2000  cefTRIAXone (ROCEPHIN) 2 g in sodium chloride 0.9 % 100 mL IVPB  Status:  Discontinued        2 g 200 mL/hr over 30 Minutes Intravenous Every 24 hours 04/30/21 0922 05/05/21 1358   04/30/21 1015  azithromycin (ZITHROMAX) 500 mg in sodium chloride 0.9 % 250 mL IVPB  Status:  Discontinued        500 mg 250 mL/hr over 60 Minutes Intravenous Every 24 hours 04/30/21 0922 05/01/21 0925   04/30/21 0800  ceFEPIme (MAXIPIME) 2 g in sodium chloride 0.9 % 100 mL IVPB  Status:  Discontinued        2 g 200 mL/hr over 30 Minutes Intravenous Every 8 hours 04/29/21 2239 04/30/21 0814   04/29/21 2359  vancomycin (VANCOREADY) IVPB 1500 mg/300 mL  Status:   Discontinued        1,500 mg 150 mL/hr over 120 Minutes Intravenous Daily at bedtime 04/29/21 2241 04/30/21 0922   04/29/21 2200  ceFEPIme (MAXIPIME) 2 g in sodium chloride 0.9 % 100 mL IVPB        2 g 200 mL/hr over 30 Minutes Intravenous  Once 04/29/21 2151 04/30/21 0048   04/29/21 2200  azithromycin (ZITHROMAX) 500 mg in sodium chloride 0.9 % 250 mL IVPB        500 mg 250 mL/hr over 60 Minutes Intravenous  Once 04/29/21 2151 04/30/21 0256         Micro    Objective   Vitals:   06/15/21 0851 06/15/21 0852 06/15/21 1153 06/15/21 1200  BP:   133/82   Pulse: (!) 108  (!) 102 (!) 102  Resp:      Temp:   98.6 F (37 C)   TempSrc:   Axillary   SpO2: 100% 100% 100% 100%  Weight:      Height:        Intake/Output Summary (Last 24 hours) at 06/15/2021 1415 Last data filed at 06/15/2021 1002 Gross per 24 hour  Intake 2741.3 ml  Output 800 ml  Net 1941.3 ml   Filed Weights   06/03/21 0500 06/04/21 0500 06/08/21 0353  Weight: 68.3 kg 68.4 kg 63.2 kg    Physical Exam: General: Critically ill-appearing, unable to follow commands, appears to be awake Cardiovascular: S1, S2 present Respiratory: Diminished breath sounds bilaterally Abdomen: Soft, NT, less distended, bowel sounds present, PEG in place Musculoskeletal: No bilateral pedal edema noted, offloading boots on bilateral feet Skin: Normal Psychiatry: Unable to assess     Labs   Data Reviewed: I have personally reviewed following labs and imaging studies  CBC: Recent Labs  Lab 06/11/21 0242 06/12/21 0343 06/13/21 0125 06/14/21 0322  06/15/21 0402  WBC 28.2* 28.1* 26.7* 14.7* 14.4*  NEUTROABS  --  20.0* 14.2* 9.4* 9.8*  HGB 14.0 13.6 13.3 12.1* 12.3*  HCT 43.6 43.1 41.9 37.9* 38.9*  MCV 89.7 90.7 90.9 90.9 89.4  PLT 383 378 332 267 291   Basic Metabolic Panel: Recent Labs  Lab 06/11/21 0242 06/12/21 0343 06/13/21 0125 06/14/21 0322 06/15/21 0402  NA 144 148* 152* 145 140  K 4.7 3.5 3.9 4.1 4.2   CL 103 113* 113* 110 108  CO2 24 23 25 25 22   GLUCOSE 131* 215* 154* 148* 122*  BUN 52* 53* 63* 44* 27*  CREATININE 1.46* 1.45* 1.37* 1.02 0.86  CALCIUM 10.5* 9.8 9.9 9.3 9.8   GFR: Estimated Creatinine Clearance: 90.8 mL/min (by C-G formula based on SCr of 0.86 mg/dL). Liver Function Tests: Recent Labs  Lab 06/10/21 0157  AST 54*  ALT 98*  ALKPHOS 61  BILITOT 0.6  PROT 8.7*  ALBUMIN 4.0   No results for input(s): LIPASE, AMYLASE in the last 168 hours. No results for input(s): AMMONIA in the last 168 hours. Coagulation Profile: No results for input(s): INR, PROTIME in the last 168 hours. Cardiac Enzymes: Recent Labs  Lab 06/09/21 0910  CKTOTAL 183   BNP (last 3 results) No results for input(s): PROBNP in the last 8760 hours. HbA1C: No results for input(s): HGBA1C in the last 72 hours. CBG: Recent Labs  Lab 06/14/21 2027 06/15/21 0012 06/15/21 0511 06/15/21 0801 06/15/21 1150  GLUCAP 116* 124* 117* 131* 134*   Lipid Profile: No results for input(s): CHOL, HDL, LDLCALC, TRIG, CHOLHDL, LDLDIRECT in the last 72 hours. Thyroid Function Tests: No results for input(s): TSH, T4TOTAL, FREET4, T3FREE, THYROIDAB in the last 72 hours. Anemia Panel: No results for input(s): VITAMINB12, FOLATE, FERRITIN, TIBC, IRON, RETICCTPCT in the last 72 hours. Sepsis Labs: Recent Labs  Lab 06/09/21 0350 06/09/21 0910 06/09/21 1146 06/10/21 0157 06/10/21 2100 06/12/21 1101 06/12/21 1409  PROCALCITON <0.10  --   --  0.13  --   --   --   LATICACIDVEN  --    < > 2.8*  --  1.5 1.3 1.4   < > = values in this interval not displayed.    Recent Results (from the past 240 hour(s))  Culture, Respiratory w Gram Stain     Status: None   Collection Time: 06/09/21  9:28 AM   Specimen: Tracheal Aspirate; Respiratory  Result Value Ref Range Status   Specimen Description TRACHEAL ASPIRATE  Final   Special Requests NONE  Final   Gram Stain   Final    FEW SQUAMOUS EPITHELIAL CELLS  PRESENT FEW WBC SEEN ABUNDANT GRAM POSITIVE COCCI    Culture   Final    RARE KLEBSIELLA PNEUMONIAE ABUNDANT STAPHYLOCOCCUS AUREUS Confirmed Extended Spectrum Beta-Lactamase Producer (ESBL).  In bloodstream infections from ESBL organisms, carbapenems are preferred over piperacillin/tazobactam. They are shown to have a lower risk of mortality. FOR KLEBSIELLA PNEUMONIAE Performed at [facility], [address], [place], [place]    Report Status 06/11/2021 FINAL  Final   Organism ID, Bacteria KLEBSIELLA PNEUMONIAE  Final   Organism ID, Bacteria STAPHYLOCOCCUS AUREUS  Final      Susceptibility   Klebsiella pneumoniae - MIC*    AMPICILLIN >=32 RESISTANT Resistant     CEFAZOLIN >=64 RESISTANT Resistant     CEFEPIME >=32 RESISTANT Resistant     CEFTAZIDIME RESISTANT Resistant     CEFTRIAXONE >=64 RESISTANT Resistant  CIPROFLOXACIN >=4 RESISTANT Resistant     GENTAMICIN >=16 RESISTANT Resistant     IMIPENEM <=0.25 SENSITIVE Sensitive     TRIMETH/SULFA >=320 RESISTANT Resistant     AMPICILLIN/SULBACTAM >=32 RESISTANT Resistant     PIP/TAZO 16 SENSITIVE Sensitive     * RARE KLEBSIELLA PNEUMONIAE   Staphylococcus aureus - MIC*    CIPROFLOXACIN <=0.5 SENSITIVE Sensitive     ERYTHROMYCIN <=0.25 SENSITIVE Sensitive     GENTAMICIN <=0.5 SENSITIVE Sensitive     OXACILLIN 0.5 SENSITIVE Sensitive     TETRACYCLINE <=1 SENSITIVE Sensitive     VANCOMYCIN <=0.5 SENSITIVE Sensitive     TRIMETH/SULFA <=10 SENSITIVE Sensitive     CLINDAMYCIN <=0.25 SENSITIVE Sensitive     RIFAMPIN <=0.5 SENSITIVE Sensitive     Inducible Clindamycin NEGATIVE Sensitive     * ABUNDANT STAPHYLOCOCCUS AUREUS  Culture, blood (routine x 2)     Status: None   Collection Time: 06/10/21  9:20 AM   Specimen: BLOOD  Result Value Ref Range Status   Specimen Description BLOOD RIGHT ANTECUBITAL  Final   Special Requests   Final    BOTTLES DRAWN AEROBIC AND ANAEROBIC Blood Culture adequate volume   Culture    Final    NO GROWTH 5 DAYS Performed at [facility], [address], [place], [place] [address]    Report Status 06/15/2021 FINAL  Final  Culture, blood (routine x 2)     Status: None   Collection Time: 06/10/21  9:20 AM   Specimen: BLOOD LEFT HAND  Result Value Ref Range Status   Specimen Description BLOOD LEFT HAND  Final   Special Requests   Final    BOTTLES DRAWN AEROBIC AND ANAEROBIC Blood Culture adequate volume   Culture   Final    NO GROWTH 5 DAYS Performed at [facility], [address] [place], [place] [address]    Report Status 06/15/2021 FINAL  Final       Imaging Studies   No results found.   Medications   Scheduled Meds:  arformoterol  15 mcg Nebulization BID   atorvastatin  40 mg Per Tube QHS   budesonide (PULMICORT) nebulizer solution  0.5 mg Nebulization BID   chlorhexidine  15 mL Mouth Rinse BID   clonazePAM  0.5 mg Per Tube Q8H   docusate  200 mg Per Tube BID   feeding supplement (PROSource TF)  45 mL Per Tube BID   heparin  5,000 Units Subcutaneous Q8H   mouth rinse  15 mL Mouth Rinse q12n4p   oxyCODONE  2.5 mg Per Tube Q6H   pantoprazole sodium  40 mg Per Tube QHS   polyethylene glycol  17 g Per Tube Daily   propranolol  20 mg Per Tube Q4H   revefenacin  175 mcg Nebulization Daily   senna  1 tablet Per Tube Daily   Continuous Infusions:  sodium chloride 250 mL (06/13/21 0755)   dextrose 75 mL/hr at 06/15/21 1347   feeding supplement (JEVITY 1.2 CAL) Stopped (06/12/21 0750)   levETIRAcetam Stopped (06/15/21 0826)   meropenem (MERREM) IV 200 mL/hr at 06/15/21 1002       LOS: 47 days      Briant Cedar, MD [facility]  06/15/2021, 2:15 PM

## 2021-06-15 NOTE — Progress Notes (Signed)
All medications per PEG tube held.  PEG tube to gravity at this time.

## 2021-06-16 ENCOUNTER — Inpatient Hospital Stay (HOSPITAL_COMMUNITY): Payer: Medicaid Other

## 2021-06-16 DIAGNOSIS — G931 Anoxic brain damage, not elsewhere classified: Secondary | ICD-10-CM | POA: Diagnosis not present

## 2021-06-16 DIAGNOSIS — J9601 Acute respiratory failure with hypoxia: Secondary | ICD-10-CM | POA: Diagnosis not present

## 2021-06-16 DIAGNOSIS — I469 Cardiac arrest, cause unspecified: Secondary | ICD-10-CM | POA: Diagnosis not present

## 2021-06-16 DIAGNOSIS — R569 Unspecified convulsions: Secondary | ICD-10-CM | POA: Diagnosis not present

## 2021-06-16 LAB — GLUCOSE, CAPILLARY
Glucose-Capillary: 113 mg/dL — ABNORMAL HIGH (ref 70–99)
Glucose-Capillary: 126 mg/dL — ABNORMAL HIGH (ref 70–99)
Glucose-Capillary: 117 mg/dL — ABNORMAL HIGH (ref 70–99)
Glucose-Capillary: 119 mg/dL — ABNORMAL HIGH (ref 70–99)

## 2021-06-16 LAB — CBC WITH DIFFERENTIAL/PLATELET
Abs Immature Granulocytes: 0.17 10*3/uL — ABNORMAL HIGH (ref 0.00–0.07)
Basophils Absolute: 0.1 10*3/uL (ref 0.0–0.1)
Basophils Relative: 0 %
Eosinophils Absolute: 0.2 10*3/uL (ref 0.0–0.5)
Eosinophils Relative: 1 %
HCT: 38 % — ABNORMAL LOW (ref 39.0–52.0)
Hemoglobin: 12.4 g/dL — ABNORMAL LOW (ref 13.0–17.0)
Immature Granulocytes: 1 %
Lymphocytes Relative: 23 %
Lymphs Abs: 3.1 10*3/uL (ref 0.7–4.0)
MCH: 28.8 pg (ref 26.0–34.0)
MCHC: 32.6 g/dL (ref 30.0–36.0)
MCV: 88.2 fL (ref 80.0–100.0)
Monocytes Absolute: 0.9 10*3/uL (ref 0.1–1.0)
Monocytes Relative: 6 %
Neutro Abs: 9.3 10*3/uL — ABNORMAL HIGH (ref 1.7–7.7)
Neutrophils Relative %: 69 %
Platelets: 302 10*3/uL (ref 150–400)
RBC: 4.31 MIL/uL (ref 4.22–5.81)
RDW: 14.4 % (ref 11.5–15.5)
WBC: 13.7 10*3/uL — ABNORMAL HIGH (ref 4.0–10.5)
nRBC: 0 % (ref 0.0–0.2)

## 2021-06-16 LAB — BASIC METABOLIC PANEL
Anion gap: 12 (ref 5–15)
BUN: 23 mg/dL — ABNORMAL HIGH (ref 6–20)
CO2: 22 mmol/L (ref 22–32)
Calcium: 9.5 mg/dL (ref 8.9–10.3)
Chloride: 101 mmol/L (ref 98–111)
Creatinine, Ser: 0.73 mg/dL (ref 0.61–1.24)
GFR, Estimated: >60 mL/min (ref >60)
Glucose, Bld: 119 mg/dL — ABNORMAL HIGH (ref 70–99)
Potassium: 4.3 mmol/L (ref 3.5–5.1)
Sodium: 135 mmol/L (ref 135–145)

## 2021-06-16 MED ORDER — DEXTROSE-NACL 5-0.45 % IV SOLN: INTRAVENOUS | Status: DC

## 2021-06-16 NOTE — Progress Notes (Signed)
PROGRESS NOTE    Vincent [name]   WKG:[id number]  DOB: 08-22-1971  PCP: Pcp, No    DOA: 04/29/2021 LOS: 48    Brief Narrative / Hospital Course to Date:   50 yo male smoker who was admitted to ICU after PEA cardiac arrest with ROSC after 6 minutes of resuscitation.  CT head showed an old infarct, nothing acute.  CT chest with bilateral dense lower lobe consolidations with emphysematous changes.   Complicated hospital course as follows, per PCCM. TRH assumed care 06/07/21.   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 / Anoxic brain injury 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 Continue Klonopin, Flexeril, Keppra once able  Acute respiratory failure with hypoxia due to Cardiac Arrest and Aspiration Pneumonia Failure to wean from ventilator s/p tracheostomy Emphysema, centrilobular PCCM continues to follow for trach management Supplement O2 to maintain sat >90% Continue Yupelri, Brovana, Pulmnicort, PRN albuterol Completed 5 day course Prednisone and 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  Severe Sepsis due to ESBL Klebsiella on sputum culture Sepsis criteria on 9/12-13: fever, leukocytosis, lactic acidosis, tachycardia PCCM following Repeat cultures growing Klebsiella, MSSA Continue Merrem for a total of 7 days  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) Likely reason for significant bowel distension CT abdomen showed above General surgery consulted- appreciate recs, will touch  base about restarting tube feeds Avoid narcotics, sedatives for now, bowel rest, npo, PEG drained to gravity  AKI Resolved Daily BMP  Hypernatremia IVF- D5W Daily BMP  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 Follow up imaging in ~3 months if patient survives hospital course  Hyperlipidemia Lipitor  Goals of care discussion Patient with persistent encephalopathy, guarded prognosis Palliative care reconsulted for further discussions of goals of care DNR    Patient BMI: Body mass index is 21.2 kg/m.   DVT prophylaxis: heparin injection 5,000 Units Start: 04/29/21 2230 SCDs Start: 04/29/21 2227   Diet:     Code Status: DNR   Subjective 06/16/21   No new change, abdomen softer since PEG to gravity    Disposition Plan & Communication   Status is: Inpatient  Remains inpatient appropriate because:Inpatient level of care appropriate due to severity of illness.  Requires placement, limited options due to insurance and trach/peg status.    Dispo: The patient is from: Home              Anticipated d/c is to: SNF vs LTAC?              Patient currently is not medically stable to d/c.   Difficult to place patient Yes  Consults, Procedures, Significant Events   Consultants:  PCCM  Procedures:  As above "significant events"  Antimicrobials:  Anti-infectives (From admission, onward)    Start     Dose/Rate Route Frequency Ordered Stop   06/10/21 1000  meropenem (MERREM) 1 g in sodium chloride 0.9 % 100 mL IVPB        1 g 200 mL/hr over 30  Minutes Intravenous Every 8 hours 06/10/21 0900     06/09/21 1045  piperacillin-tazobactam (ZOSYN) IVPB 3.375 g  Status:  Discontinued        3.375 g 12.5 mL/hr over 240 Minutes Intravenous Every 8 hours 06/09/21 0959 06/10/21 0840   06/06/21 1000  azithromycin (ZITHROMAX) tablet 250 mg  Status:  Discontinued       See Hyperspace for full Linked Orders Report.   250 mg Oral Daily 06/05/21 1014 06/05/21 1629   06/06/21 1000  azithromycin (ZITHROMAX) tablet 250 mg       See Hyperspace for full Linked Orders Report.   250 mg Per Tube Daily 06/05/21 1629 06/09/21 1007   06/05/21 1100  azithromycin (ZITHROMAX) tablet 500 mg       See Hyperspace for full Linked Orders Report.   500 mg Oral  Once 06/05/21 1014 06/05/21 1124   05/30/21 1530  vancomycin (VANCOREADY) IVPB 1250 mg/250 mL  Status:  Discontinued        1,250 mg 166.7 mL/hr over 90 Minutes Intravenous Every 24 hours 05/29/21 1504 05/30/21 1028   05/30/21 0100  ceFEPIme (MAXIPIME) 2 g in sodium chloride 0.9 % 100 mL IVPB  Status:  Discontinued        2 g 200 mL/hr over 30 Minutes Intravenous Every 12 hours 05/29/21 1504 05/30/21 1028   05/29/21 1430  ceFEPIme (MAXIPIME) 2 g in sodium chloride 0.9 % 100 mL IVPB        2 g 200 mL/hr over 30 Minutes Intravenous  Once 05/29/21 1331 05/29/21 1614   05/29/21 1430  vancomycin (VANCOREADY) IVPB 1500 mg/300 mL        1,500 mg 150 mL/hr over 120 Minutes Intravenous  Once 05/29/21 1331 05/29/21 1814   05/23/21 1000  fluconazole (DIFLUCAN) tablet 100 mg       See Hyperspace for full Linked Orders Report.   100 mg Per Tube Daily 05/22/21 1043 05/31/21 0940   05/22/21 2000  piperacillin-tazobactam (ZOSYN) IVPB 3.375 g  Status:  Discontinued        3.375 g 12.5 mL/hr over 240 Minutes Intravenous Every 8 hours 05/22/21 1227 05/23/21 1017   05/22/21 1315  piperacillin-tazobactam (ZOSYN) IVPB 3.375 g        3.375 g 100 mL/hr over 30 Minutes Intravenous  Once 05/22/21 1227 05/22/21 1404   05/22/21  1130  fluconazole (DIFLUCAN) tablet 100 mg  Status:  Discontinued        100 mg Per Tube Daily 05/22/21 1035 05/22/21 1042   05/22/21 1130  fluconazole (DIFLUCAN) tablet 200 mg       See Hyperspace for full Linked Orders Report.   200 mg Per Tube  Once 05/22/21 1043 05/22/21 1054   05/21/21 1112  ceFAZolin (ANCEF) IVPB 1 g/50 mL premix        over 30 Minutes  Continuous PRN 05/21/21 1120 05/21/21 1148   05/21/21 0600  ceFAZolin (ANCEF) IVPB 2g/100 mL premix        2 g 200 mL/hr over 30 Minutes Intravenous To Radiology 05/20/21 1318 05/21/21 1142   05/16/21 1800  Ampicillin-Sulbactam (UNASYN) 3 g in sodium chloride 0.9 % 100 mL IVPB  Status:  Discontinued        3 g 200 mL/hr over 30 Minutes Intravenous Every 6 hours 05/16/21 1107 05/19/21 1014   05/15/21 1100  piperacillin-tazobactam (ZOSYN) IVPB 3.375 g  Status:  Discontinued        3.375 g 12.5 mL/hr over 240 Minutes Intravenous Every 8 hours 05/15/21 1000 05/16/21 1107   05/05/21 1400  amoxicillin-clavulanate (AUGMENTIN) 875-125 MG per tablet 1 tablet        1 tablet Per Tube Every 12 hours 05/05/21 1358 05/06/21 1453   04/30/21 2000  ceFEPIme (MAXIPIME) 2 g in sodium chloride 0.9 % 100 mL IVPB  Status:  Discontinued        2 g 200 mL/hr over 30 Minutes Intravenous Every 12 hours 04/30/21 0814 04/30/21 0922   04/30/21 2000  cefTRIAXone (ROCEPHIN) 2 g in sodium chloride 0.9 % 100 mL IVPB  Status:  Discontinued        2 g 200 mL/hr over 30 Minutes Intravenous Every 24 hours 04/30/21 0922 05/05/21 1358   04/30/21 1015  azithromycin (ZITHROMAX) 500 mg in sodium chloride 0.9 % 250 mL IVPB  Status:  Discontinued        500 mg 250 mL/hr over 60 Minutes Intravenous Every 24 hours 04/30/21 0922 05/01/21 0925   04/30/21 0800  ceFEPIme (MAXIPIME) 2 g in sodium chloride 0.9 % 100 mL IVPB  Status:  Discontinued        2 g 200 mL/hr over 30 Minutes Intravenous Every 8 hours 04/29/21 2239 04/30/21 0814   04/29/21 2359  vancomycin (VANCOREADY) IVPB  1500 mg/300 mL  Status:  Discontinued        1,500 mg 150 mL/hr over 120 Minutes Intravenous Daily at bedtime 04/29/21 2241 04/30/21 0922   04/29/21 2200  ceFEPIme (MAXIPIME) 2 g in sodium chloride 0.9 % 100 mL IVPB        2 g 200 mL/hr over 30 Minutes Intravenous  Once 04/29/21 2151 04/30/21 0048   04/29/21 2200  azithromycin (ZITHROMAX) 500 mg in sodium chloride 0.9 % 250 mL IVPB        500 mg 250 mL/hr over 60 Minutes Intravenous  Once 04/29/21 2151 04/30/21 0256         Micro    Objective   Vitals:   06/16/21 0755 06/16/21 0831 06/16/21 1125 06/16/21 1151  BP: (!) 128/93  (!) 138/93   Pulse: (!) 106  (!) 107 (!) 112  Resp: 20  20 20   Temp: 98.4 F (36.9 C)  98.8 F (37.1 C)   TempSrc: Axillary  Axillary   SpO2: 100% 100% 100% 100%  Weight:      Height:        Intake/Output Summary (Last 24 hours) at 06/16/2021 1242 Last data filed at 06/16/2021 0755 Gross per 24 hour  Intake 445.89 ml  Output 1400 ml  Net -954.11 ml   Filed Weights   06/03/21 0500 06/04/21 0500 06/08/21 0353  Weight: 68.3 kg 68.4 kg 63.2 kg    Physical Exam: General: Critically ill-appearing, unable to follow commands, appears to be awake Cardiovascular: S1, S2 present Respiratory: Diminished breath sounds bilaterally Abdomen: Soft, NT, less distended, bowel sounds present, PEG in place Musculoskeletal: No bilateral pedal edema noted, offloading boots on bilateral feet Skin: Normal Psychiatry: Unable to assess     Labs   Data Reviewed: I have personally reviewed following labs and imaging studies  CBC: Recent Labs  Lab 06/12/21 0343 06/13/21 0125 06/14/21 0322 06/15/21 0402 06/16/21 1150  WBC 28.1* 26.7* 14.7* 14.4* 13.7*  NEUTROABS 20.0* 14.2* 9.4* 9.8* 9.3*  HGB 13.6 13.3 12.1* 12.3* 12.4*  HCT 43.1 41.9 37.9* 38.9* 38.0*  MCV 90.7 90.9 90.9 89.4 88.2  PLT 378 332 267 291 302   Basic Metabolic Panel: Recent Labs  Lab 06/11/21 0242 06/12/21 0343 06/13/21 0125  06/14/21 0322 06/15/21 0402  NA 144 148* 152* 145 140  K 4.7 3.5 3.9 4.1 4.2  CL 103 113* 113* 110 108  CO2 24 23 25 25 22   GLUCOSE 131* 215* 154* 148* 122*  BUN 52* 53* 63* 44* 27*  CREATININE 1.46* 1.45* 1.37* 1.02 0.86  CALCIUM 10.5* 9.8 9.9 9.3 9.8   GFR: Estimated Creatinine Clearance: 90.8 mL/min (by C-G formula based on SCr of 0.86 mg/dL). Liver Function Tests: Recent Labs  Lab 06/10/21 0157  AST 54*  ALT 98*  ALKPHOS 61  BILITOT 0.6  PROT 8.7*  ALBUMIN 4.0   No results for input(s): LIPASE, AMYLASE in the last 168 hours. No results for input(s): AMMONIA in the last 168 hours. Coagulation Profile: No results for input(s): INR, PROTIME in the last 168 hours. Cardiac Enzymes: No results for input(s): CKTOTAL, CKMB, CKMBINDEX, TROPONINI in the last 168 hours.  BNP (last 3 results) No results for input(s): PROBNP in the last 8760 hours. HbA1C: No results for input(s): HGBA1C in the last 72 hours. CBG: Recent Labs  Lab 06/15/21 1150 06/15/21 1536 06/15/21 2120 06/16/21 0738 06/16/21 1217  GLUCAP 134* 134* 110* 113* 126*   Lipid Profile: No results for input(s): CHOL, HDL, LDLCALC, TRIG, CHOLHDL, LDLDIRECT in the last 72 hours. Thyroid Function Tests: No results for input(s): TSH, T4TOTAL, FREET4, T3FREE, THYROIDAB in the last 72 hours. Anemia Panel: No results for input(s): VITAMINB12, FOLATE, FERRITIN, TIBC, IRON, RETICCTPCT in the last 72 hours. Sepsis Labs: Recent Labs  Lab 06/10/21 0157 06/10/21 2100 06/12/21 1101 06/12/21 1409  PROCALCITON 0.13  --   --   --   LATICACIDVEN  --  1.5 1.3 1.4    Recent Results (from the past 240 hour(s))  Culture, Respiratory w Gram Stain     Status: None   Collection Time: 06/09/21  9:28 AM   Specimen: Tracheal Aspirate; Respiratory  Result Value Ref Range Status   Specimen Description TRACHEAL ASPIRATE  Final   Special Requests NONE  Final   Gram Stain   Final    FEW SQUAMOUS EPITHELIAL CELLS PRESENT FEW  WBC SEEN ABUNDANT GRAM POSITIVE COCCI    Culture   Final    RARE KLEBSIELLA PNEUMONIAE ABUNDANT STAPHYLOCOCCUS AUREUS Confirmed Extended Spectrum Beta-Lactamase Producer (ESBL).  In bloodstream infections from ESBL organisms, carbapenems are preferred over piperacillin/tazobactam. They are shown to have a lower risk of mortality. FOR KLEBSIELLA PNEUMONIAE Performed at [facility], [address], [place], [place]    Report Status 06/11/2021 FINAL  Final   Organism ID, Bacteria KLEBSIELLA PNEUMONIAE  Final   Organism ID, Bacteria STAPHYLOCOCCUS AUREUS  Final      Susceptibility   Klebsiella pneumoniae - MIC*    AMPICILLIN >=32 RESISTANT Resistant     CEFAZOLIN >=64 RESISTANT Resistant     CEFEPIME >=32 RESISTANT Resistant     CEFTAZIDIME RESISTANT Resistant     CEFTRIAXONE >=64 RESISTANT Resistant     CIPROFLOXACIN >=4 RESISTANT Resistant     GENTAMICIN >=16 RESISTANT Resistant  IMIPENEM <=0.25 SENSITIVE Sensitive     TRIMETH/SULFA >=320 RESISTANT Resistant     AMPICILLIN/SULBACTAM >=32 RESISTANT Resistant     PIP/TAZO 16 SENSITIVE Sensitive     * RARE KLEBSIELLA PNEUMONIAE   Staphylococcus aureus - MIC*    CIPROFLOXACIN <=0.5 SENSITIVE Sensitive     ERYTHROMYCIN <=0.25 SENSITIVE Sensitive     GENTAMICIN <=0.5 SENSITIVE Sensitive     OXACILLIN 0.5 SENSITIVE Sensitive     TETRACYCLINE <=1 SENSITIVE Sensitive     VANCOMYCIN <=0.5 SENSITIVE Sensitive     TRIMETH/SULFA <=10 SENSITIVE Sensitive     CLINDAMYCIN <=0.25 SENSITIVE Sensitive     RIFAMPIN <=0.5 SENSITIVE Sensitive     Inducible Clindamycin NEGATIVE Sensitive     * ABUNDANT STAPHYLOCOCCUS AUREUS  Culture, blood (routine x 2)     Status: None   Collection Time: 06/10/21  9:20 AM   Specimen: BLOOD  Result Value Ref Range Status   Specimen Description BLOOD RIGHT ANTECUBITAL  Final   Special Requests   Final    BOTTLES DRAWN AEROBIC AND ANAEROBIC Blood Culture adequate volume   Culture   Final    NO  GROWTH 5 DAYS Performed at [facility], [address], [place], [place] [address]    Report Status 06/15/2021 FINAL  Final  Culture, blood (routine x 2)     Status: None   Collection Time: 06/10/21  9:20 AM   Specimen: BLOOD LEFT HAND  Result Value Ref Range Status   Specimen Description BLOOD LEFT HAND  Final   Special Requests   Final    BOTTLES DRAWN AEROBIC AND ANAEROBIC Blood Culture adequate volume   Culture   Final    NO GROWTH 5 DAYS Performed at [facility], [address] [place], [place] [address]    Report Status 06/15/2021 FINAL  Final       Imaging Studies   No results found.   Medications   Scheduled Meds:  arformoterol  15 mcg Nebulization BID   atorvastatin  40 mg Per Tube QHS   budesonide (PULMICORT) nebulizer solution  0.5 mg Nebulization BID   chlorhexidine  15 mL Mouth Rinse BID   clonazePAM  0.5 mg Per Tube Q8H   docusate  200 mg Per Tube BID   feeding supplement (PROSource TF)  45 mL Per Tube BID   heparin  5,000 Units Subcutaneous Q8H   mouth rinse  15 mL Mouth Rinse q12n4p   oxyCODONE  2.5 mg Per Tube Q6H   pantoprazole sodium  40 mg Per Tube QHS   polyethylene glycol  17 g Per Tube Daily   propranolol  20 mg Per Tube Q4H   revefenacin  175 mcg Nebulization Daily   senna  1 tablet Per Tube Daily   Continuous Infusions:  sodium chloride 250 mL (06/15/21 1719)   dextrose 75 mL/hr at 06/16/21 0931   feeding supplement (JEVITY 1.2 CAL) Stopped (06/12/21 0750)   levETIRAcetam 500 mg (06/16/21 0928)   meropenem (MERREM) IV 1 g (06/16/21 1126)       LOS: 48 days      Briant Cedar, MD [facility]  06/16/2021, 12:42 PM

## 2021-06-16 NOTE — Progress Notes (Signed)
MD contacted about meds via PEG tube. Meds held until further notice per MD.

## 2021-06-16 NOTE — Progress Notes (Signed)
   NAME:  Vincent [name], MRN:  [id number], DOB:  1970-12-20, LOS: 48 ADMISSION DATE:  04/29/2021, CONSULTATION DATE:  04/29/21 REFERRING MD:  Dr. Jacqulyn Bath, CHIEF COMPLAINT:  Found Down   History of present illness   50 y/o male smoker admitted with PEA cardiac arrest with ROSC after 6 minutes.  CT head showed old infarct. CT chest showed b/l dense lower lobe consolidation with changes of emphysema. UDS positive for benzodiazepines and THC.  Past Medical History  Emphysema, CVA, DM type 2  Significant Hospital Events   8/2 PEA Arrest, [facility] airway in the field, CPR x6 minutes and epi x1, ROSC, Intubated in ED, Admitted to PCCM  8/4 Weaned off TTM, Coox O2 sat of 47%, started on milrinone and lasix 8/5 milrinone increased 8/6 TF stopped, OG placed to suction for ileus 8/8 Post pyloric Cortrak placed, myoclonic jerking, started Keppra, depakote 8/10 Completed antibiotic course for pneumonia. Family meeting transitioned to DNR. 8/15 Tracheostomy  8/18 Febrile overnight, started Zosyn for aspiration pneumonia 8/22 Completed Abx course for aspiration pneumonia  8/24 PEG placed 8/25 ABX started for fevers and ? Free air in abdomen. CT non-acute.  8/31 PSV wean 2 hours 9/1 Stopped lasix, farxiga, losartan, spiro with AKI. Ongoing jerking, diaphoresis. WBC increased, cultured / abx started empirically.  PCT increased. CK 2,450 9/2 start scheduled opiates for possible opiate withdrawal symptoms 9/4 add flexeril, on PSV.  Versed x1 overnight. 9/9 has tolerated trach collar for 48+hours  Interim history/subjective:  No events. Ongoing GOC discussions. Having some issues with ileus.  Objective   Blood pressure (!) 138/93, pulse (!) 112, temperature 98.8 F (37.1 C), temperature source Axillary, resp. rate 20, height 5\' 8"  (1.727 m), weight 63.2 kg, SpO2 100 %.    FiO2 (%):  [28 %] 28 %   Intake/Output Summary (Last 24 hours) at 06/16/2021 1330 Last data filed at 06/16/2021 0755 Gross per 24  hour  Intake 445.89 ml  Output 1400 ml  Net -954.11 ml    Filed Weights   06/03/21 0500 06/04/21 0500 06/08/21 0353  Weight: 68.3 kg 68.4 kg 63.2 kg   Exam: No distress Lungs diminished bilaterally at bases Small thick secretions, no accessory muscle use Ext without edema Abdomen soft, hypoactive BS, PEG in place  WBC slightly better BMP stable Afebrile On meropenem for ESBL klebsiella and staph  Resolved Hospital Problem list   Bowel Obstruction/ileus, Acute Kidney Injury from ATN 2nd to Cardiogenic shock, Aspiration Pneumonia, Hyponatremia, Oral candidiasis  Assessment & Plan:   ESBL Klebsiella & MSSA HCAP 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 Acute Systolic/Diastolic CHF after Cardiac Arrest. HLD. Acute Hypoxic Respiratory Failure from Cardiac Arrest and  tracheostomy. Centrilobular Emphysema. LUL Nodular Density 2.6 x 2.1 cm abutting pleura.  - Continue GOC talks - Complete course of meropenem - Continue TC, pulmonary toileting measures, nebs - f/u CT around December depending on GOC - Given lack of improvement in mental state, I do not think he is a good candidate for decannulation; please reconsult January as needed  [place] MD PCCM

## 2021-06-17 DIAGNOSIS — G931 Anoxic brain damage, not elsewhere classified: Secondary | ICD-10-CM | POA: Diagnosis not present

## 2021-06-17 DIAGNOSIS — J9601 Acute respiratory failure with hypoxia: Secondary | ICD-10-CM | POA: Diagnosis not present

## 2021-06-17 DIAGNOSIS — I469 Cardiac arrest, cause unspecified: Secondary | ICD-10-CM | POA: Diagnosis not present

## 2021-06-17 DIAGNOSIS — R569 Unspecified convulsions: Secondary | ICD-10-CM | POA: Diagnosis not present

## 2021-06-17 LAB — CBC WITH DIFFERENTIAL/PLATELET
Abs Immature Granulocytes: 0.22 10*3/uL — ABNORMAL HIGH (ref 0.00–0.07)
Basophils Absolute: 0.1 10*3/uL (ref 0.0–0.1)
Basophils Relative: 0 %
Eosinophils Absolute: 0.2 10*3/uL (ref 0.0–0.5)
Eosinophils Relative: 2 %
HCT: 37.6 % — ABNORMAL LOW (ref 39.0–52.0)
Hemoglobin: 12.1 g/dL — ABNORMAL LOW (ref 13.0–17.0)
Immature Granulocytes: 2 %
Lymphocytes Relative: 22 %
Lymphs Abs: 3.1 10*3/uL (ref 0.7–4.0)
MCH: 28.3 pg (ref 26.0–34.0)
MCHC: 32.2 g/dL (ref 30.0–36.0)
MCV: 87.9 fL (ref 80.0–100.0)
Monocytes Absolute: 0.9 10*3/uL (ref 0.1–1.0)
Monocytes Relative: 6 %
Neutro Abs: 9.7 10*3/uL — ABNORMAL HIGH (ref 1.7–7.7)
Neutrophils Relative %: 68 %
Platelets: 359 10*3/uL (ref 150–400)
RBC: 4.28 MIL/uL (ref 4.22–5.81)
RDW: 14.8 % (ref 11.5–15.5)
WBC: 14.2 10*3/uL — ABNORMAL HIGH (ref 4.0–10.5)
nRBC: 0 % (ref 0.0–0.2)

## 2021-06-17 LAB — BASIC METABOLIC PANEL
Anion gap: 12 (ref 5–15)
BUN: 23 mg/dL — ABNORMAL HIGH (ref 6–20)
CO2: 21 mmol/L — ABNORMAL LOW (ref 22–32)
Calcium: 9.6 mg/dL (ref 8.9–10.3)
Chloride: 101 mmol/L (ref 98–111)
Creatinine, Ser: 0.73 mg/dL (ref 0.61–1.24)
GFR, Estimated: >60 mL/min (ref >60)
Glucose, Bld: 111 mg/dL — ABNORMAL HIGH (ref 70–99)
Potassium: 4 mmol/L (ref 3.5–5.1)
Sodium: 134 mmol/L — ABNORMAL LOW (ref 135–145)

## 2021-06-17 LAB — GLUCOSE, CAPILLARY
Glucose-Capillary: 102 mg/dL — ABNORMAL HIGH (ref 70–99)
Glucose-Capillary: 109 mg/dL — ABNORMAL HIGH (ref 70–99)
Glucose-Capillary: 112 mg/dL — ABNORMAL HIGH (ref 70–99)
Glucose-Capillary: 115 mg/dL — ABNORMAL HIGH (ref 70–99)
Glucose-Capillary: 126 mg/dL — ABNORMAL HIGH (ref 70–99)
Glucose-Capillary: 132 mg/dL — ABNORMAL HIGH (ref 70–99)

## 2021-06-17 MED ORDER — JEVITY 1.2 CAL PO LIQD
1000.0000 mL | ORAL | Status: DC
  Administered 2021-06-18: 1000 mL
  Filled 2021-06-17 (×2): qty 1000

## 2021-06-17 MED ORDER — METOPROLOL TARTRATE 50 MG PO TABS
50.0000 mg | ORAL_TABLET | Freq: Two times a day (BID) | ORAL | Status: DC
  Administered 2021-06-17 – 2021-06-22 (×11): 50 mg
  Filled 2021-06-17 (×12): qty 1

## 2021-06-17 MED ORDER — CLONAZEPAM 0.5 MG PO TABS: 0.5000 mg | ORAL_TABLET | Freq: Three times a day (TID) | ORAL | Status: DC | PRN

## 2021-06-17 MED ORDER — OXYCODONE HCL 5 MG PO TABS
2.5000 mg | ORAL_TABLET | Freq: Four times a day (QID) | ORAL | Status: DC | PRN
Start: 2021-06-17 — End: 2021-06-24
  Filled 2021-06-17: qty 1

## 2021-06-17 MED ORDER — PROPRANOLOL HCL 20 MG PO TABS: 20.0000 mg | ORAL_TABLET | ORAL | Status: DC

## 2021-06-17 NOTE — Progress Notes (Signed)
Nutrition Follow-up  DOCUMENTATION CODES:   Not applicable  INTERVENTION:   Continue TF via PEG: Jevity 1.2 starting at 50 ml/h, recommend increase by 10 ml every 4 hours to goal rate of 75 ml/h (1800 ml/d). Prosource TF 45 ml BID. Free water flushes 140 ml every 6 hours.  Provides 2240 kcal, 122 gm protein, 2018 ml free water daily  Recommend check weights at least weekly.   Once tolerating TF at goal rate, could transition to bolus feedings:  Change to Jevity 1.5 start with 120 ml bolus, increase each feeding by 60 ml until reaching goal of 360 ml QID.  Prosource TF 45 ml TID.  Free water flushes 100 ml before and after each bolus feeding Above bolus regimen provides 2250 kcal, 124 gm protein, 1880 ml free water daily.    NUTRITION DIAGNOSIS:   Inadequate oral intake related to inability to eat as evidenced by NPO status.  Ongoing   GOAL:   Patient will meet greater than or equal to 90% of their needs  Currently unmet as TF has been on hold  MONITOR:   Labs, Weight trends, TF tolerance, Skin, I & O's  REASON FOR ASSESSMENT:   Ventilator, Consult Enteral/tube feeding initiation and management  ASSESSMENT:   50 yo male admitted S/P PEA cardiac arrest at home. PMH includes tobacco abuse, emphysema, CVA, diabetes.  TF held yesterday d/t abdominal distention. G-tube was placed to gravity and abdominal distention resolved. Abd x-ray 9/19 was negative. Dilated bowel loops from 9/14 film had resolved. TF being resumed today. RN reports plans to advance slowly to assess tolerance.   Could transition to bolus TF regimen to allow for venting of G-tube as needed between feedings.   Labs reviewed. Na 134 CBG: [id number]  Medications reviewed and include Colace, Protonix, Senokot, Keppra.  Last weight 63.2 kg on 9/11 Admission weight 70 kg on 8/2  Diet Order:   Diet Order     None       EDUCATION NEEDS:   Not appropriate for education at this  time  Skin:  Skin Assessment: Reviewed RN Assessment  Last BM:  9/18 type 6  Height:   Ht Readings from Last 1 Encounters:  05/26/21 5\' 8"  (1.727 m)    Weight:   Wt Readings from Last 1 Encounters:  06/08/21 63.2 kg    Ideal Body Weight:  70 kg  BMI:  Body mass index is 21.2 kg/m.  Estimated Nutritional Needs:   Kcal:  2000-2200  Protein:  110-125 gm  Fluid:  >/= 2 L    08/08/21, RD, LDN, CNSC Please refer to Amion for contact information.

## 2021-06-17 NOTE — Progress Notes (Signed)
PROGRESS NOTE    Vincent [name]   AVW:[id number]  DOB: Feb 03, 1971  PCP: Pcp, No    DOA: 04/29/2021 LOS: 49    Brief Narrative / Hospital Course to Date:   50 yo male smoker who was admitted to ICU after PEA cardiac arrest with ROSC after 6 minutes of resuscitation.  CT head showed an old infarct, nothing acute.  CT chest with bilateral dense lower lobe consolidations with emphysematous changes.   Complicated hospital course as follows, per PCCM. TRH assumed care 06/07/21.   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 / Anoxic brain injury 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 Continue Klonopin prn, Keppra   Acute respiratory failure with hypoxia due to Cardiac Arrest and Aspiration Pneumonia Failure to wean from ventilator s/p tracheostomy Emphysema, centrilobular PCCM continues to follow for trach management Supplement O2 to maintain sat >90% Continue Yupelri, Brovana, Pulmnicort, PRN albuterol Completed 5 day course Prednisone and 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  Severe Sepsis due to ESBL Klebsiella on sputum culture Sepsis criteria on 9/12-13: fever, leukocytosis, lactic acidosis, tachycardia Repeat cultures growing Klebsiella, MSSA Completed Merrem for a total of 8 days  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) Improved CT abdomen showed above Repeat Abd xray- resolved General surgery consulted- appreciate recs Restarted tubefeeds at a low rate on 06/17/21 Avoid  narcotics, sedatives for now only prn  AKI Resolved Daily BMP  Hypernatremia Resolved Daily BMP  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 Follow up imaging in ~3 months if patient survives hospital course  Hyperlipidemia Lipitor  Goals of care discussion Patient with persistent encephalopathy, guarded prognosis Palliative care reconsulted for further discussions of goals of care Sister is adamant about full scope of treatment DNR    Patient BMI: Body mass index is 21.2 kg/m.   DVT prophylaxis: heparin injection 5,000 Units Start: 04/29/21 2230 SCDs Start: 04/29/21 2227   Diet:     Code Status: DNR   Subjective 06/17/21   Pt stable, abdomen much softer and less distended    Disposition Plan & Communication   Status is: Inpatient  Remains inpatient appropriate because:Inpatient level of care appropriate due to severity of illness.  Requires placement, limited options due to insurance and trach/peg status.    Dispo: The patient is from: Home              Anticipated d/c is to: SNF vs LTAC?              Patient currently is not medically stable to d/c.   Difficult to place patient Yes    Consults, Procedures, Significant Events   Consultants:  PCCM  Procedures:  As above "significant events"  Antimicrobials:  Anti-infectives (  From admission, onward)    Start     Dose/Rate Route Frequency Ordered Stop   06/10/21 1000  meropenem (MERREM) 1 g in sodium chloride 0.9 % 100 mL IVPB  Status:  Discontinued        1 g 200 mL/hr over 30 Minutes Intravenous Every 8 hours 06/10/21 0900 06/17/21 1255   06/09/21 1045  piperacillin-tazobactam (ZOSYN)  IVPB 3.375 g  Status:  Discontinued        3.375 g 12.5 mL/hr over 240 Minutes Intravenous Every 8 hours 06/09/21 0959 06/10/21 0840   06/06/21 1000  azithromycin (ZITHROMAX) tablet 250 mg  Status:  Discontinued       See Hyperspace for full Linked Orders Report.   250 mg Oral Daily 06/05/21 1014 06/05/21 1629   06/06/21 1000  azithromycin (ZITHROMAX) tablet 250 mg       See Hyperspace for full Linked Orders Report.   250 mg Per Tube Daily 06/05/21 1629 06/09/21 1007   06/05/21 1100  azithromycin (ZITHROMAX) tablet 500 mg       See Hyperspace for full Linked Orders Report.   500 mg Oral  Once 06/05/21 1014 06/05/21 1124   05/30/21 1530  vancomycin (VANCOREADY) IVPB 1250 mg/250 mL  Status:  Discontinued        1,250 mg 166.7 mL/hr over 90 Minutes Intravenous Every 24 hours 05/29/21 1504 05/30/21 1028   05/30/21 0100  ceFEPIme (MAXIPIME) 2 g in sodium chloride 0.9 % 100 mL IVPB  Status:  Discontinued        2 g 200 mL/hr over 30 Minutes Intravenous Every 12 hours 05/29/21 1504 05/30/21 1028   05/29/21 1430  ceFEPIme (MAXIPIME) 2 g in sodium chloride 0.9 % 100 mL IVPB        2 g 200 mL/hr over 30 Minutes Intravenous  Once 05/29/21 1331 05/29/21 1614   05/29/21 1430  vancomycin (VANCOREADY) IVPB 1500 mg/300 mL        1,500 mg 150 mL/hr over 120 Minutes Intravenous  Once 05/29/21 1331 05/29/21 1814   05/23/21 1000  fluconazole (DIFLUCAN) tablet 100 mg       See Hyperspace for full Linked Orders Report.   100 mg Per Tube Daily 05/22/21 1043 05/31/21 0940   05/22/21 2000  piperacillin-tazobactam (ZOSYN) IVPB 3.375 g  Status:  Discontinued        3.375 g 12.5 mL/hr over 240 Minutes Intravenous Every 8 hours 05/22/21 1227 05/23/21 1017   05/22/21 1315  piperacillin-tazobactam (ZOSYN) IVPB 3.375 g        3.375 g 100 mL/hr over 30 Minutes Intravenous  Once 05/22/21 1227 05/22/21 1404   05/22/21 1130  fluconazole (DIFLUCAN) tablet 100 mg  Status:  Discontinued        100 mg Per Tube Daily  05/22/21 1035 05/22/21 1042   05/22/21 1130  fluconazole (DIFLUCAN) tablet 200 mg       See Hyperspace for full Linked Orders Report.   200 mg Per Tube  Once 05/22/21 1043 05/22/21 1054   05/21/21 1112  ceFAZolin (ANCEF) IVPB 1 g/50 mL premix        over 30 Minutes  Continuous PRN 05/21/21 1120 05/21/21 1148   05/21/21 0600  ceFAZolin (ANCEF) IVPB 2g/100 mL premix        2 g 200 mL/hr over 30 Minutes Intravenous To Radiology 05/20/21 1318 05/21/21 1142   05/16/21 1800  Ampicillin-Sulbactam (UNASYN) 3 g in sodium chloride 0.9 % 100 mL IVPB  Status:  Discontinued  3 g 200 mL/hr over 30 Minutes Intravenous Every 6 hours 05/16/21 1107 05/19/21 1014   05/15/21 1100  piperacillin-tazobactam (ZOSYN) IVPB 3.375 g  Status:  Discontinued        3.375 g 12.5 mL/hr over 240 Minutes Intravenous Every 8 hours 05/15/21 1000 05/16/21 1107   05/05/21 1400  amoxicillin-clavulanate (AUGMENTIN) 875-125 MG per tablet 1 tablet        1 tablet Per Tube Every 12 hours 05/05/21 1358 05/06/21 1453   04/30/21 2000  ceFEPIme (MAXIPIME) 2 g in sodium chloride 0.9 % 100 mL IVPB  Status:  Discontinued        2 g 200 mL/hr over 30 Minutes Intravenous Every 12 hours 04/30/21 0814 04/30/21 0922   04/30/21 2000  cefTRIAXone (ROCEPHIN) 2 g in sodium chloride 0.9 % 100 mL IVPB  Status:  Discontinued        2 g 200 mL/hr over 30 Minutes Intravenous Every 24 hours 04/30/21 0922 05/05/21 1358   04/30/21 1015  azithromycin (ZITHROMAX) 500 mg in sodium chloride 0.9 % 250 mL IVPB  Status:  Discontinued        500 mg 250 mL/hr over 60 Minutes Intravenous Every 24 hours 04/30/21 0922 05/01/21 0925   04/30/21 0800  ceFEPIme (MAXIPIME) 2 g in sodium chloride 0.9 % 100 mL IVPB  Status:  Discontinued        2 g 200 mL/hr over 30 Minutes Intravenous Every 8 hours 04/29/21 2239 04/30/21 0814   04/29/21 2359  vancomycin (VANCOREADY) IVPB 1500 mg/300 mL  Status:  Discontinued        1,500 mg 150 mL/hr over 120 Minutes Intravenous  Daily at bedtime 04/29/21 2241 04/30/21 0922   04/29/21 2200  ceFEPIme (MAXIPIME) 2 g in sodium chloride 0.9 % 100 mL IVPB        2 g 200 mL/hr over 30 Minutes Intravenous  Once 04/29/21 2151 04/30/21 0048   04/29/21 2200  azithromycin (ZITHROMAX) 500 mg in sodium chloride 0.9 % 250 mL IVPB        500 mg 250 mL/hr over 60 Minutes Intravenous  Once 04/29/21 2151 04/30/21 0256         Micro    Objective   Vitals:   06/17/21 0900 06/17/21 1159 06/17/21 1412 06/17/21 1622  BP: 126/82  116/82 120/77  Pulse: (!) 103  (!) 102 (!) 116  Resp: 19 16 17    Temp: 98.1 F (36.7 C)  98.4 F (36.9 C) 99.4 F (37.4 C)  TempSrc: Oral  Oral Oral  SpO2: 100%  100% 98%  Weight:      Height:        Intake/Output Summary (Last 24 hours) at 06/17/2021 1906 Last data filed at 06/17/2021 1606 Gross per 24 hour  Intake 1810.59 ml  Output 700 ml  Net 1110.59 ml   Filed Weights   06/03/21 0500 06/04/21 0500 06/08/21 0353  Weight: 68.3 kg 68.4 kg 63.2 kg    Physical Exam: General: Critically ill-appearing, unable to follow commands, appears to be awake Cardiovascular: S1, S2 present Respiratory: Diminished breath sounds bilaterally Abdomen: Soft, NT, ND, bowel sounds present, PEG in place Musculoskeletal: No bilateral pedal edema noted, offloading boots on bilateral feet Skin: Normal Psychiatry: Unable to assess     Labs   Data Reviewed: I have personally reviewed following labs and imaging studies  CBC: Recent Labs  Lab 06/13/21 0125 06/14/21 0322 06/15/21 0402 06/16/21 1150 06/17/21 0204  WBC 26.7* 14.7* 14.4* 13.7* 14.2*  NEUTROABS 14.2* 9.4* 9.8* 9.3* 9.7*  HGB 13.3 12.1* 12.3* 12.4* 12.1*  HCT 41.9 37.9* 38.9* 38.0* 37.6*  MCV 90.9 90.9 89.4 88.2 87.9  PLT 332 267 291 302 359   Basic Metabolic Panel: Recent Labs  Lab 06/13/21 0125 06/14/21 0322 06/15/21 0402 06/16/21 1150 06/17/21 0204  NA 152* 145 140 135 134*  K 3.9 4.1 4.2 4.3 4.0  CL 113* 110 108 101 101   CO2 25 25 22 22  21*  GLUCOSE 154* 148* 122* 119* 111*  BUN 63* 44* 27* 23* 23*  CREATININE 1.37* 1.02 0.86 0.73 0.73  CALCIUM 9.9 9.3 9.8 9.5 9.6   GFR: Estimated Creatinine Clearance: 97.7 mL/min (by C-G formula based on SCr of 0.73 mg/dL). Liver Function Tests: No results for input(s): AST, ALT, ALKPHOS, BILITOT, PROT, ALBUMIN in the last 168 hours.  No results for input(s): LIPASE, AMYLASE in the last 168 hours. No results for input(s): AMMONIA in the last 168 hours. Coagulation Profile: No results for input(s): INR, PROTIME in the last 168 hours. Cardiac Enzymes: No results for input(s): CKTOTAL, CKMB, CKMBINDEX, TROPONINI in the last 168 hours.  BNP (last 3 results) No results for input(s): PROBNP in the last 8760 hours. HbA1C: No results for input(s): HGBA1C in the last 72 hours. CBG: Recent Labs  Lab 06/17/21 0013 06/17/21 0418 06/17/21 0726 06/17/21 1129 06/17/21 1617  GLUCAP 102* 112* 132* 109* 115*   Lipid Profile: No results for input(s): CHOL, HDL, LDLCALC, TRIG, CHOLHDL, LDLDIRECT in the last 72 hours. Thyroid Function Tests: No results for input(s): TSH, T4TOTAL, FREET4, T3FREE, THYROIDAB in the last 72 hours. Anemia Panel: No results for input(s): VITAMINB12, FOLATE, FERRITIN, TIBC, IRON, RETICCTPCT in the last 72 hours. Sepsis Labs: Recent Labs  Lab 06/10/21 2100 06/12/21 1101 06/12/21 1409  LATICACIDVEN 1.5 1.3 1.4    Recent Results (from the past 240 hour(s))  Culture, Respiratory w Gram Stain     Status: None   Collection Time: 06/09/21  9:28 AM   Specimen: Tracheal Aspirate; Respiratory  Result Value Ref Range Status   Specimen Description TRACHEAL ASPIRATE  Final   Special Requests NONE  Final   Gram Stain   Final    FEW SQUAMOUS EPITHELIAL CELLS PRESENT FEW WBC SEEN ABUNDANT GRAM POSITIVE COCCI    Culture   Final    RARE KLEBSIELLA PNEUMONIAE ABUNDANT STAPHYLOCOCCUS AUREUS Confirmed Extended Spectrum Beta-Lactamase Producer (ESBL).   In bloodstream infections from ESBL organisms, carbapenems are preferred over piperacillin/tazobactam. They are shown to have a lower risk of mortality. FOR KLEBSIELLA PNEUMONIAE Performed at [facility], [address], [address], [place]    Report Status 06/11/2021 FINAL  Final   Organism ID, Bacteria KLEBSIELLA PNEUMONIAE  Final   Organism ID, Bacteria STAPHYLOCOCCUS AUREUS  Final      Susceptibility   Klebsiella pneumoniae - MIC*    AMPICILLIN >=32 RESISTANT Resistant     CEFAZOLIN >=64 RESISTANT Resistant     CEFEPIME >=32 RESISTANT Resistant     CEFTAZIDIME RESISTANT Resistant     CEFTRIAXONE >=64 RESISTANT Resistant     CIPROFLOXACIN >=4 RESISTANT Resistant     GENTAMICIN >=16 RESISTANT Resistant     IMIPENEM <=0.25 SENSITIVE Sensitive     TRIMETH/SULFA >=320 RESISTANT Resistant     AMPICILLIN/SULBACTAM >=32 RESISTANT Resistant     PIP/TAZO 16 SENSITIVE Sensitive     * RARE KLEBSIELLA PNEUMONIAE   Staphylococcus aureus - MIC*    CIPROFLOXACIN <=0.5 SENSITIVE Sensitive  ERYTHROMYCIN <=0.25 SENSITIVE Sensitive     GENTAMICIN <=0.5 SENSITIVE Sensitive     OXACILLIN 0.5 SENSITIVE Sensitive     TETRACYCLINE <=1 SENSITIVE Sensitive     VANCOMYCIN <=0.5 SENSITIVE Sensitive     TRIMETH/SULFA <=10 SENSITIVE Sensitive     CLINDAMYCIN <=0.25 SENSITIVE Sensitive     RIFAMPIN <=0.5 SENSITIVE Sensitive     Inducible Clindamycin NEGATIVE Sensitive     * ABUNDANT STAPHYLOCOCCUS AUREUS  Culture, blood (routine x 2)     Status: None   Collection Time: 06/10/21  9:20 AM   Specimen: BLOOD  Result Value Ref Range Status   Specimen Description BLOOD RIGHT ANTECUBITAL  Final   Special Requests   Final    BOTTLES DRAWN AEROBIC AND ANAEROBIC Blood Culture adequate volume   Culture   Final    NO GROWTH 5 DAYS Performed at [facility], [address] [place], [place] [address]    Report Status 06/15/2021 FINAL  Final  Culture, blood (routine x 2)     Status: None    Collection Time: 06/10/21  9:20 AM   Specimen: BLOOD LEFT HAND  Result Value Ref Range Status   Specimen Description BLOOD LEFT HAND  Final   Special Requests   Final    BOTTLES DRAWN AEROBIC AND ANAEROBIC Blood Culture adequate volume   Culture   Final    NO GROWTH 5 DAYS Performed at [facility], [address] [place], [place] [address]    Report Status 06/15/2021 FINAL  Final       Imaging Studies   DG Abd Portable 1V  Result Date: 06/16/2021 CLINICAL DATA:  Abdominal distension. EXAM: PORTABLE ABDOMEN - 1 VIEW COMPARISON:  June 11, 2021. FINDINGS: The bowel gas pattern is normal. Bowel dilatation noted on prior exam has resolved. No radio-opaque calculi or other significant radiographic abnormality are seen. IMPRESSION: Negative. Electronically Signed   By: Lupita Raider M.D.   On: 06/16/2021 16:58     Medications   Scheduled Meds:  arformoterol  15 mcg Nebulization BID   atorvastatin  40 mg Per Tube QHS   budesonide (PULMICORT) nebulizer solution  0.5 mg Nebulization BID   chlorhexidine  15 mL Mouth Rinse BID   docusate  200 mg Per Tube BID   feeding supplement (PROSource TF)  45 mL Per Tube BID   heparin  5,000 Units Subcutaneous Q8H   mouth rinse  15 mL Mouth Rinse q12n4p   pantoprazole sodium  40 mg Per Tube QHS   polyethylene glycol  17 g Per Tube Daily   revefenacin  175 mcg Nebulization Daily   senna  1 tablet Per Tube Daily   Continuous Infusions:  sodium chloride 250 mL (06/15/21 1719)   feeding supplement (JEVITY 1.2 CAL) 50 mL/hr at 06/17/21 1253   levETIRAcetam Stopped (06/17/21 0723)       LOS: 49 days      Briant Cedar, MD [facility]  06/17/2021, 7:06 PM

## 2021-06-18 DIAGNOSIS — N179 Acute kidney failure, unspecified: Secondary | ICD-10-CM | POA: Diagnosis not present

## 2021-06-18 DIAGNOSIS — G931 Anoxic brain damage, not elsewhere classified: Secondary | ICD-10-CM | POA: Diagnosis not present

## 2021-06-18 DIAGNOSIS — J9601 Acute respiratory failure with hypoxia: Secondary | ICD-10-CM | POA: Diagnosis not present

## 2021-06-18 LAB — GLUCOSE, CAPILLARY
Glucose-Capillary: 108 mg/dL — ABNORMAL HIGH (ref 70–99)
Glucose-Capillary: 111 mg/dL — ABNORMAL HIGH (ref 70–99)
Glucose-Capillary: 112 mg/dL — ABNORMAL HIGH (ref 70–99)
Glucose-Capillary: 116 mg/dL — ABNORMAL HIGH (ref 70–99)
Glucose-Capillary: 118 mg/dL — ABNORMAL HIGH (ref 70–99)
Glucose-Capillary: 99 mg/dL (ref 70–99)

## 2021-06-18 LAB — CBC WITH DIFFERENTIAL/PLATELET
Abs Immature Granulocytes: 0.16 10*3/uL — ABNORMAL HIGH (ref 0.00–0.07)
Basophils Absolute: 0.1 10*3/uL (ref 0.0–0.1)
Basophils Relative: 0 %
Eosinophils Absolute: 0.4 10*3/uL (ref 0.0–0.5)
Eosinophils Relative: 3 %
HCT: 35.9 % — ABNORMAL LOW (ref 39.0–52.0)
Hemoglobin: 11.6 g/dL — ABNORMAL LOW (ref 13.0–17.0)
Immature Granulocytes: 1 %
Lymphocytes Relative: 25 %
Lymphs Abs: 3.3 10*3/uL (ref 0.7–4.0)
MCH: 28.6 pg (ref 26.0–34.0)
MCHC: 32.3 g/dL (ref 30.0–36.0)
MCV: 88.4 fL (ref 80.0–100.0)
Monocytes Absolute: 1 10*3/uL (ref 0.1–1.0)
Monocytes Relative: 8 %
Neutro Abs: 8.3 10*3/uL — ABNORMAL HIGH (ref 1.7–7.7)
Neutrophils Relative %: 63 %
Platelets: 373 10*3/uL (ref 150–400)
RBC: 4.06 MIL/uL — ABNORMAL LOW (ref 4.22–5.81)
RDW: 15.1 % (ref 11.5–15.5)
WBC: 13.1 10*3/uL — ABNORMAL HIGH (ref 4.0–10.5)
nRBC: 0 % (ref 0.0–0.2)

## 2021-06-18 LAB — BASIC METABOLIC PANEL
Anion gap: 11 (ref 5–15)
BUN: 24 mg/dL — ABNORMAL HIGH (ref 6–20)
CO2: 18 mmol/L — ABNORMAL LOW (ref 22–32)
Calcium: 9.1 mg/dL (ref 8.9–10.3)
Chloride: 104 mmol/L (ref 98–111)
Creatinine, Ser: 0.64 mg/dL (ref 0.61–1.24)
GFR, Estimated: >60 mL/min (ref >60)
Glucose, Bld: 112 mg/dL — ABNORMAL HIGH (ref 70–99)
Potassium: 4.4 mmol/L (ref 3.5–5.1)
Sodium: 133 mmol/L — ABNORMAL LOW (ref 135–145)

## 2021-06-18 MED ORDER — PROSOURCE TF PO LIQD
45.0000 mL | Freq: Three times a day (TID) | ORAL | Status: DC
  Administered 2021-06-18 – 2021-07-17 (×86): 45 mL
  Filled 2021-06-18 (×91): qty 45

## 2021-06-18 MED ORDER — JEVITY 1.5 CAL/FIBER PO LIQD
1000.0000 mL | ORAL | Status: DC
  Administered 2021-06-19 – 2021-07-15 (×27): 1000 mL
  Filled 2021-06-18 (×45): qty 1000

## 2021-06-18 NOTE — Progress Notes (Signed)
[facility] PROGRESS NOTE    Progress Note  Vincent [name]  TOI:[id number] DOB: 22-May-1971 DOA: 04/29/2021 PCP: Pcp, No     Brief Narrative:   Vincent [name] is an 50 y.o. 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 8/8 Post pyloric Cortrak placed, myoclonic jerking, started Keppra, depakote 8/10 Completed antibiotic course for pneumonia. Family meeting transitioned to DNR. 8/15 Tracheostomy  8/18 Febrile overnight, started Zosyn for aspiration pneumonia 8/22 Completed Abx course for aspiration pneumonia  8/24 PEG placed 8/31 PSV wean 2 hours 9/1 Stopped lasix, farxiga, losartan, spiro with AKI. Ongoing jerking, diaphoresis. WBC increased, cultured / abx started empirically.  PCT increased. CK 2,450 9/2 start scheduled opiates for possible opiate withdrawal symptoms 9/4 add flexeril, on PSV.  Versed x1 overnight. 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  Acute respiratory failure with hypoxia in the setting of cardiac arrest and aspiration pneumonia: He was heart to wean off the ventilator as he has emphysema, PCCM is following he is currently trach. Continue inhalers.  Sepsis due to ESBL Klebsiella in sputum culture: Blood culture showed Klebsiella and MSSA, he did complete  a course of antibiotics.  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 He has been started on tube feedings on 06/17/2021 we will increase slowly. Avoid narcotics and sedatives.  Acute kidney injury: Resolved.  Hypovolemic hyponatremia: Resolved.  Continue free water flushes per tube  Acute combined systolic and diastolic heart failure: Continue Lopressor Lasix as needed  Opiate withdrawals: Resolved.  Nutrition: Status post PEG tube placement continue tube feedings and free water flushes.  Left upper lung nodule: Will need a follow-up CT in 3 months.  Hyperlipidemia: Continue Lipitor.  Goals of care: Persistent encephalopathy guarded prognosis palliative care was consulted sister was adamant about continuing full scope of treatment. The patient remains a DNR.  DVT prophylaxis: lovenxo Family Communication:n4on Status is: Inpatient  Remains inpatient appropriate because:Hemodynamically unstable  Dispo: The patient is from: Home              Anticipated d/c is to: Home              Patient currently is not medically stable to d/c.   Difficult to place patient No        Code Status:     Code Status Orders  (From admission, onward)           Start     Ordered   05/07/21 1633  Do not attempt resuscitation (DNR)  Continuous       Question Answer Comment  In the event of cardiac or respiratory ARREST Do not call a "code blue"   In the event of cardiac or respiratory ARREST Do not perform Intubation, CPR, defibrillation or ACLS   In the  event of cardiac or respiratory ARREST Use medication by any route, position, wound care, and other measures to relive pain and suffering. May use oxygen, suction and manual treatment of airway obstruction as needed for comfort.      05/07/21 1632           Code Status History     Date Active Date Inactive Code Status Order ID Comments User Context   04/29/2021  2229 05/07/2021 1632 Full Code [id number]  Kathlene Cote, PA-C ED         IV Access:   Peripheral IV   Procedures and diagnostic studies:   DG Abd Portable 1V  Result Date: 06/16/2021 CLINICAL DATA:  Abdominal distension. EXAM: PORTABLE ABDOMEN - 1 VIEW COMPARISON:  June 11, 2021. FINDINGS: The bowel gas pattern is normal. Bowel dilatation noted on prior exam has resolved. No radio-opaque calculi or other significant radiographic abnormality are seen. IMPRESSION: Negative. Electronically Signed   By: Lupita Raider M.D.   On: 06/16/2021 16:58     Medical Consultants:   None.   Subjective:    Vincent [name] unresponsive nonverbal  Objective:    Vitals:   06/18/21 0030 06/18/21 0423 06/18/21 0457 06/18/21 0758  BP: (!) 118/91  105/61 131/78  Pulse: 96 (!) 104 100 98  Resp: 15 (!) 22 (!) 23 (!) 21  Temp: 98.8 F (37.1 C)  98.7 F (37.1 C) 98.3 F (36.8 C)  TempSrc: Axillary  Axillary Oral  SpO2: 100% 100% 100% 100%  Weight:      Height:       SpO2: 100 % O2 Flow Rate (L/min): 5 L/min FiO2 (%): 21 %   Intake/Output Summary (Last 24 hours) at 06/18/2021 0821 Last data filed at 06/18/2021 0031 Gross per 24 hour  Intake 1810.59 ml  Output 950 ml  Net 860.59 ml   Filed Weights   06/03/21 0500 06/04/21 0500 06/08/21 0353  Weight: 68.3 kg 68.4 kg 63.2 kg    Exam: General exam: In no acute distress. Respiratory system: Good air movement and clear to auscultation. Cardiovascular system: S1 & S2 heard, RRR. No JVD. Gastrointestinal system: Abdomen is nondistended, soft and nontender.  Extremities: No pedal edema. Skin: No rashes, lesions or ulcers  Data Reviewed:    Labs: Basic Metabolic Panel: Recent Labs  Lab 06/14/21 0322 06/15/21 0402 06/16/21 1150 06/17/21 0204 06/18/21 0301  NA 145 140 135 134* 133*  K 4.1 4.2 4.3 4.0 4.4  CL 110 108 101 101 104  CO2 25 22 22  21* 18*  GLUCOSE 148* 122* 119* 111* 112*  BUN 44* 27* 23* 23* 24*   CREATININE 1.02 0.86 0.73 0.73 0.64  CALCIUM 9.3 9.8 9.5 9.6 9.1   GFR Estimated Creatinine Clearance: 97.7 mL/min (by C-G formula based on SCr of 0.64 mg/dL). Liver Function Tests: No results for input(s): AST, ALT, ALKPHOS, BILITOT, PROT, ALBUMIN in the last 168 hours. No results for input(s): LIPASE, AMYLASE in the last 168 hours. No results for input(s): AMMONIA in the last 168 hours. Coagulation profile No results for input(s): INR, PROTIME in the last 168 hours. COVID-19 Labs  No results for input(s): DDIMER, FERRITIN, LDH, CRP in the last 72 hours.  Lab Results  Component Value Date   SARSCOV2NAA NEGATIVE 04/29/2021    CBC: Recent Labs  Lab 06/14/21 0322 06/15/21 0402 06/16/21 1150 06/17/21 0204 06/18/21 0301  WBC 14.7* 14.4* 13.7* 14.2* 13.1*  NEUTROABS 9.4* 9.8* 9.3* 9.7* 8.3*  HGB 12.1*  12.3* 12.4* 12.1* 11.6*  HCT 37.9* 38.9* 38.0* 37.6* 35.9*  MCV 90.9 89.4 88.2 87.9 88.4  PLT 267 291 302 359 373   Cardiac Enzymes: No results for input(s): CKTOTAL, CKMB, CKMBINDEX, TROPONINI in the last 168 hours. BNP (last 3 results) No results for input(s): PROBNP in the last 8760 hours. CBG: Recent Labs  Lab 06/17/21 1617 06/17/21 1947 06/18/21 0029 06/18/21 0459 06/18/21 0802  GLUCAP 115* 126* 112* 118* 108*   D-Dimer: No results for input(s): DDIMER in the last 72 hours. Hgb A1c: No results for input(s): HGBA1C in the last 72 hours. Lipid Profile: No results for input(s): CHOL, HDL, LDLCALC, TRIG, CHOLHDL, LDLDIRECT in the last 72 hours. Thyroid function studies: No results for input(s): TSH, T4TOTAL, T3FREE, THYROIDAB in the last 72 hours.  Invalid input(s): FREET3 Anemia work up: No results for input(s): VITAMINB12, FOLATE, FERRITIN, TIBC, IRON, RETICCTPCT in the last 72 hours. Sepsis Labs: Recent Labs  Lab 06/12/21 1101 06/12/21 1409 06/13/21 0125 06/15/21 0402 06/16/21 1150 06/17/21 0204 06/18/21 0301  WBC  --   --    < > 14.4* 13.7* 14.2*  13.1*  LATICACIDVEN 1.3 1.4  --   --   --   --   --    < > = values in this interval not displayed.   Microbiology Recent Results (from the past 240 hour(s))  Culture, Respiratory w Gram Stain     Status: None   Collection Time: 06/09/21  9:28 AM   Specimen: Tracheal Aspirate; Respiratory  Result Value Ref Range Status   Specimen Description TRACHEAL ASPIRATE  Final   Special Requests NONE  Final   Gram Stain   Final    FEW SQUAMOUS EPITHELIAL CELLS PRESENT FEW WBC SEEN ABUNDANT GRAM POSITIVE COCCI    Culture   Final    RARE KLEBSIELLA PNEUMONIAE ABUNDANT STAPHYLOCOCCUS AUREUS Confirmed Extended Spectrum Beta-Lactamase Producer (ESBL).  In bloodstream infections from ESBL organisms, carbapenems are preferred over piperacillin/tazobactam. They are shown to have a lower risk of mortality. FOR KLEBSIELLA PNEUMONIAE Performed at [facility], [address], [place], [place] [address]    Report Status 06/11/2021 FINAL  Final   Organism ID, Bacteria KLEBSIELLA PNEUMONIAE  Final   Organism ID, Bacteria STAPHYLOCOCCUS AUREUS  Final      Susceptibility   Klebsiella pneumoniae - MIC*    AMPICILLIN >=32 RESISTANT Resistant     CEFAZOLIN >=64 RESISTANT Resistant     CEFEPIME >=32 RESISTANT Resistant     CEFTAZIDIME RESISTANT Resistant     CEFTRIAXONE >=64 RESISTANT Resistant     CIPROFLOXACIN >=4 RESISTANT Resistant     GENTAMICIN >=16 RESISTANT Resistant     IMIPENEM <=0.25 SENSITIVE Sensitive     TRIMETH/SULFA >=320 RESISTANT Resistant     AMPICILLIN/SULBACTAM >=32 RESISTANT Resistant     PIP/TAZO 16 SENSITIVE Sensitive     * RARE KLEBSIELLA PNEUMONIAE   Staphylococcus aureus - MIC*    CIPROFLOXACIN <=0.5 SENSITIVE Sensitive     ERYTHROMYCIN <=0.25 SENSITIVE Sensitive     GENTAMICIN <=0.5 SENSITIVE Sensitive     OXACILLIN 0.5 SENSITIVE Sensitive     TETRACYCLINE <=1 SENSITIVE Sensitive     VANCOMYCIN <=0.5 SENSITIVE Sensitive     TRIMETH/SULFA <=10 SENSITIVE Sensitive      CLINDAMYCIN <=0.25 SENSITIVE Sensitive     RIFAMPIN <=0.5 SENSITIVE Sensitive     Inducible Clindamycin NEGATIVE Sensitive     * ABUNDANT STAPHYLOCOCCUS AUREUS  Culture, blood (routine x 2)  Status: None   Collection Time: 06/10/21  9:20 AM   Specimen: BLOOD  Result Value Ref Range Status   Specimen Description BLOOD RIGHT ANTECUBITAL  Final   Special Requests   Final    BOTTLES DRAWN AEROBIC AND ANAEROBIC Blood Culture adequate volume   Culture   Final    NO GROWTH 5 DAYS Performed at [facility], [address] [place], [place] [address]    Report Status 06/15/2021 FINAL  Final  Culture, blood (routine x 2)     Status: None   Collection Time: 06/10/21  9:20 AM   Specimen: BLOOD LEFT HAND  Result Value Ref Range Status   Specimen Description BLOOD LEFT HAND  Final   Special Requests   Final    BOTTLES DRAWN AEROBIC AND ANAEROBIC Blood Culture adequate volume   Culture   Final    NO GROWTH 5 DAYS Performed at [facility], [address], [place], [place] [address]    Report Status 06/15/2021 FINAL  Final     Medications:    arformoterol  15 mcg Nebulization BID   atorvastatin  40 mg Per Tube QHS   budesonide (PULMICORT) nebulizer solution  0.5 mg Nebulization BID   chlorhexidine  15 mL Mouth Rinse BID   docusate  200 mg Per Tube BID   feeding supplement (PROSource TF)  45 mL Per Tube BID   heparin  5,000 Units Subcutaneous Q8H   mouth rinse  15 mL Mouth Rinse q12n4p   metoprolol tartrate  50 mg Per Tube BID   pantoprazole sodium  40 mg Per Tube QHS   polyethylene glycol  17 g Per Tube Daily   revefenacin  175 mcg Nebulization Daily   senna  1 tablet Per Tube Daily   Continuous Infusions:  sodium chloride 250 mL (06/15/21 1719)   feeding supplement (JEVITY 1.2 CAL) 50 mL/hr at 06/17/21 1253   levETIRAcetam 500 mg (06/18/21 0640)      LOS: 50 days   Marinda Elk  [facility]  06/18/2021, 8:21 AM

## 2021-06-18 NOTE — Progress Notes (Signed)
Nutrition Follow-up  DOCUMENTATION CODES:   Not applicable  INTERVENTION:   Continue TF via PEG: Change to Jevity 1.5 at 60 ml/h (1440 ml/d). Prosource TF 45 ml TID. Free water flushes 140 ml every 6 hours.  Provides 2280 kcal, 125 gm protein, 1094 ml free water daily (1654 ml total with flushes)  Recommend check weights at least weekly.    NUTRITION DIAGNOSIS:   Inadequate oral intake related to inability to eat as evidenced by NPO status.  Ongoing   GOAL:   Patient will meet greater than or equal to 90% of their needs  Currently unmet as TF has been on hold  MONITOR:   Labs, Weight trends, TF tolerance, Skin, I & O's  REASON FOR ASSESSMENT:   Ventilator, Consult Enteral/tube feeding initiation and management  ASSESSMENT:   50 yo male admitted S/P PEA cardiac arrest at home. PMH includes tobacco abuse, emphysema, CVA, diabetes.  TF resumed yesterday. Currently receiving Jevity 1.2 at 50 ml/h, tolerating well, but not quite at goal rate (75 ml/h). Will change to a more concentrated formula and decrease goal rate.   Labs reviewed. Na 133 CBG: 112-118-108-99  Medications reviewed and include Colace, Protonix, Senokot, Keppra.  Last weight 63.2 kg on 9/11 Admission weight 70 kg on 8/2  Patient with mild muscle depletion in legs, as expected with recent limited activity since admission.  Diet Order:   Diet Order     None      Nutrition Focused Physical Exam (completed 9/21):  Flowsheet Row Most Recent Value  Orbital Region No depletion  Upper Arm Region No depletion  Thoracic and Lumbar Region No depletion  Buccal Region No depletion  Temple Region No depletion  Clavicle Bone Region No depletion  Clavicle and Acromion Bone Region No depletion  Scapular Bone Region Unable to assess  Dorsal Hand No depletion  Patellar Region Mild depletion  Anterior Thigh Region Mild depletion  Posterior Calf Region Mild depletion  Edema (RD Assessment) Mild   Hair Reviewed  Eyes Unable to assess  Mouth Unable to assess  Skin Reviewed  Nails Reviewed       EDUCATION NEEDS:   Not appropriate for education at this time  Skin:  Skin Assessment: Reviewed RN Assessment  Last BM:  9/21  Height:   Ht Readings from Last 1 Encounters:  05/26/21 5\' 8"  (1.727 m)    Weight:   Wt Readings from Last 1 Encounters:  06/08/21 63.2 kg    Ideal Body Weight:  70 kg  BMI:  Body mass index is 21.2 kg/m.  Estimated Nutritional Needs:   Kcal:  2000-2200  Protein:  110-125 gm  Fluid:  >/= 2 L    08/08/21, RD, LDN, CNSC Please refer to Amion for contact information.

## 2021-06-19 DIAGNOSIS — I469 Cardiac arrest, cause unspecified: Secondary | ICD-10-CM | POA: Diagnosis not present

## 2021-06-19 DIAGNOSIS — R403 Persistent vegetative state: Secondary | ICD-10-CM

## 2021-06-19 DIAGNOSIS — G931 Anoxic brain damage, not elsewhere classified: Secondary | ICD-10-CM | POA: Diagnosis not present

## 2021-06-19 DIAGNOSIS — Z930 Tracheostomy status: Secondary | ICD-10-CM | POA: Diagnosis not present

## 2021-06-19 DIAGNOSIS — R569 Unspecified convulsions: Secondary | ICD-10-CM | POA: Diagnosis not present

## 2021-06-19 LAB — CBC WITH DIFFERENTIAL/PLATELET
Abs Immature Granulocytes: 0.15 10*3/uL — ABNORMAL HIGH (ref 0.00–0.07)
Basophils Absolute: 0.1 10*3/uL (ref 0.0–0.1)
Basophils Relative: 1 %
Eosinophils Absolute: 0.3 10*3/uL (ref 0.0–0.5)
Eosinophils Relative: 2 %
HCT: 37 % — ABNORMAL LOW (ref 39.0–52.0)
Hemoglobin: 12 g/dL — ABNORMAL LOW (ref 13.0–17.0)
Immature Granulocytes: 1 %
Lymphocytes Relative: 28 %
Lymphs Abs: 3.5 10*3/uL (ref 0.7–4.0)
MCH: 29.3 pg (ref 26.0–34.0)
MCHC: 32.4 g/dL (ref 30.0–36.0)
MCV: 90.2 fL (ref 80.0–100.0)
Monocytes Absolute: 1.2 10*3/uL — ABNORMAL HIGH (ref 0.1–1.0)
Monocytes Relative: 10 %
Neutro Abs: 7.2 10*3/uL (ref 1.7–7.7)
Neutrophils Relative %: 58 %
Platelets: 421 10*3/uL — ABNORMAL HIGH (ref 150–400)
RBC: 4.1 MIL/uL — ABNORMAL LOW (ref 4.22–5.81)
RDW: 15.1 % (ref 11.5–15.5)
WBC: 12.3 10*3/uL — ABNORMAL HIGH (ref 4.0–10.5)
nRBC: 0 % (ref 0.0–0.2)

## 2021-06-19 LAB — BASIC METABOLIC PANEL
Anion gap: 11 (ref 5–15)
BUN: 26 mg/dL — ABNORMAL HIGH (ref 6–20)
CO2: 19 mmol/L — ABNORMAL LOW (ref 22–32)
Calcium: 9.3 mg/dL (ref 8.9–10.3)
Chloride: 103 mmol/L (ref 98–111)
Creatinine, Ser: 0.59 mg/dL — ABNORMAL LOW (ref 0.61–1.24)
GFR, Estimated: >60 mL/min (ref >60)
Glucose, Bld: 97 mg/dL (ref 70–99)
Potassium: 4.6 mmol/L (ref 3.5–5.1)
Sodium: 133 mmol/L — ABNORMAL LOW (ref 135–145)

## 2021-06-19 LAB — GLUCOSE, CAPILLARY
Glucose-Capillary: 107 mg/dL — ABNORMAL HIGH (ref 70–99)
Glucose-Capillary: 107 mg/dL — ABNORMAL HIGH (ref 70–99)
Glucose-Capillary: 116 mg/dL — ABNORMAL HIGH (ref 70–99)
Glucose-Capillary: 157 mg/dL — ABNORMAL HIGH (ref 70–99)
Glucose-Capillary: 93 mg/dL (ref 70–99)
Glucose-Capillary: 96 mg/dL (ref 70–99)

## 2021-06-19 MED ORDER — LEVETIRACETAM 100 MG/ML PO SOLN
1000.0000 mg | Freq: Two times a day (BID) | ORAL | Status: DC
  Administered 2021-06-19 – 2021-07-22 (×67): 1000 mg
  Filled 2021-06-19 (×71): qty 10

## 2021-06-19 NOTE — TOC Progression Note (Addendum)
Transition of Care [facility]) - Progression Note    Patient Details  Name: Vincent [name] MRN: [id number] Date of Birth: 03-27-1971  [facility] CM/SW Contact  Terrial Rhodes, LCSWA Phone Number: 06/19/2021, 10:02 AM  Clinical Narrative:     Update 2:12pm- CSW tried to call [facility] again to check on referral status for patient. CSW left voicemail for [facility] in admissions. CSW awaiting callback.  CSW left voicemail for admissions at [facility]. CSW awaiting callback to check on referral status. CSW will continue to follow and assist with dc planning needs.  Expected Discharge Plan: Skilled Nursing Facility (Resides with sister ( W/C bound)) Barriers to Discharge: Vent Bed not available  Expected Discharge Plan and Services Expected Discharge Plan: Skilled Nursing Facility (Resides with sister ( W/C bound))   Discharge Planning Services: CM Consult                                           Social Determinants of Health (SDOH) Interventions    Readmission Risk Interventions No flowsheet data found.

## 2021-06-19 NOTE — Plan of Care (Signed)

## 2021-06-19 NOTE — Progress Notes (Signed)
[facility] PROGRESS NOTE  Vincent [name] Isenberg FGH:[id number] DOB: [birth year] DOA: 04/29/2021 PCP: Pcp, No  Status: Remains inpatient appropriate because:Altered mental status and Unsafe d/c plan  Dispo: The patient is from: Home              Anticipated d/c is to: SNF-trach capable              Patient currently is medically stable to d/c.  Difficult to place patient Yes               Barriers to discharge: Medicaid funding- permanent trach  Level of care: Progressive   Code Status: DNR Family Communication:  DVT prophylaxis: Subcutaneous heparin COVID vaccination status: Unknown  HPI:  50 y.o. 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  Will open eyes to voice and look at you but does not track or try to interact.  Objective: Vitals:   06/19/21 0803 06/19/21 0804  BP:  140/87  Pulse:  96  Resp:  20  Temp:    SpO2: 100% 100%    Intake/Output Summary (Last 24 hours) at 06/19/2021 0817 Last data filed at 06/19/2021 0600 Gross per 24 hour  Intake 1150 ml  Output 1350 ml  Net -200 ml   Filed Weights   06/03/21 0500 06/04/21 0500 06/08/21 0353  Weight: 68.3 kg 68.4 kg 63.2 kg    Exam:  Constitutional: NAD, calm, comfortable  Respiratory: #6.0 cuffed Shiley trach to trach collar, clear to auscultation bilaterally, no wheezing, no crackles. Normal respiratory effort. No accessory muscle use.  Cardiovascular: Regular rate and rhythm, no murmurs / rubs / gallops. No extremity edema. 2+ pedal pulses. No carotid bruits.  Abdomen: no tenderness, no masses palpated. No hepatosplenomegaly. Bowel sounds positive.  Musculoskeletal: no clubbing / cyanosis. No joint deformity upper and lower extremities. Good ROM, no  contractures. Normal muscle tone.  Skin: no rashes, lesions, ulcers. No induration Neurologic: CN 2-12 appear to be grossly intact on visual inspection.  Does not flinch with painful stimulus applied to arms or legs-no purposeful movement detected Psychiatric: Opens eyes to voice and will look at you when you speak but does not track and quickly closes eyes or looks away and does not attempt to interact.  Significant Events: 8/2 PEA Arrest, King airway in the field, CPR x6 minutes and epi x1, ROSC, Intubated in ED, Admitted to [facility]  8/24 PEG placed 8/4 Weaned off TTM, Coox O2 sat of 47%. 8/8 Post pyloric Cortrak placed, myoclonic jerking, started Keppra, depakote 8/10 Completed antibiotic course for pneumonia. Family meeting transitioned to DNR. 8/15 Tracheostomy  8/18 Febrile overnight, started Zosyn for aspiration pneumonia 8/22 Completed Abx course for aspiration pneumonia  8/24 PEG placed 8/31 PSV wean 2 hours 9/1 Stopped lasix, farxiga, losartan, spiro with AKI. Ongoing jerking, diaphoresis. WBC increased, cultured / abx started empirically.  PCT increased. CK 2,450 9/2 start scheduled opiates for possible opiate withdrawal symptoms 9/4 add flexeril, on PSV.  Versed x1 overnight. 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  EEG without active seizure activity noted Check levetiracetam level  Acute respiratory failure with hypoxia  in the setting of cardiac arrest and aspiration pneumonia: Weaned from ventilator and now is stable on trach collar Trach team/PCCM following but given his persistent vegetative state he is not a candidate for decannulation Continue inhalers.  Sepsis due to ESBL Klebsiella in sputum culture: Blood culture showed Klebsiella and MSSA, he did complete a course of antibiotics.    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 Tolerating Jevity tube feeding at 60 cc/h Avoid narcotics and sedatives.  Acute kidney injury: Resolved.  Hypovolemic hyponatremia: Resolved.  Continue free water flushes per tube  Acute combined systolic and diastolic heart failure: Echocardiogram August 2022 demonstrated severely depressed LV function with an EF < 20%.  Has associated grade 2 diastolic dysfunction that is restrictive in nature and has associated pulmonary hypertension 40 mmHg Continue Lopressor Lasix as needed   Opiate withdrawals: Resolved.  Nutrition: Status post PEG tube placement continue tube feedings and free water flushes.  Left upper lung nodule: Will need a follow-up CT in 3 months.  Hyperlipidemia: Continue Lipitor.   Goals of care: Persistent encephalopathy guarded prognosis palliative care was consulted sister was adamant about continuing full scope of treatment. The patient remains a DNR.      Data Reviewed: Basic Metabolic Panel: Recent Labs  Lab 06/15/21 0402 06/16/21 1150 06/17/21 0204 06/18/21 0301 06/19/21 0228  NA 140 135 134* 133* 133*  K 4.2 4.3 4.0 4.4 4.6  CL 108 101 101 104 103  CO2 22 22 21* 18* 19*  GLUCOSE 122* 119* 111* 112* 97  BUN 27* 23* 23* 24* 26*  CREATININE 0.86 0.73 0.73 0.64 0.59*  CALCIUM 9.8 9.5 9.6 9.1 9.3   Liver Function Tests: No results for input(s): AST, ALT, ALKPHOS, BILITOT, PROT, ALBUMIN in the last 168 hours. No results for input(s): LIPASE, AMYLASE in the last 168 hours. No results for input(s): AMMONIA in the last 168 hours. CBC: Recent Labs  Lab 06/15/21 0402 06/16/21 1150 06/17/21 0204 06/18/21 0301 06/19/21 0228  WBC 14.4* 13.7* 14.2* 13.1* 12.3*  NEUTROABS 9.8* 9.3* 9.7* 8.3* 7.2  HGB 12.3* 12.4* 12.1* 11.6* 12.0*  HCT 38.9* 38.0* 37.6* 35.9* 37.0*  MCV 89.4 88.2 87.9 88.4 90.2  PLT 291 302 359 373 421*    BNP (last 3  results) Recent Labs    04/29/21 2048 06/09/21 0350  BNP 626.7* 406.5*    CBG: Recent Labs  Lab 06/18/21 1556 06/18/21 2026 06/19/21 0007 06/19/21 0412 06/19/21 0750  GLUCAP 116* 111* 93 116* 107*    Recent Results (from the past 240 hour(s))  Culture, Respiratory w Gram Stain     Status: None   Collection Time: 06/09/21  9:28 AM   Specimen: Tracheal Aspirate; Respiratory  Result Value Ref Range Status   Specimen Description TRACHEAL ASPIRATE  Final   Special Requests NONE  Final   Gram Stain   Final    FEW SQUAMOUS EPITHELIAL CELLS PRESENT FEW WBC SEEN ABUNDANT GRAM POSITIVE COCCI    Culture   Final    RARE KLEBSIELLA PNEUMONIAE ABUNDANT STAPHYLOCOCCUS AUREUS Confirmed Extended Spectrum Beta-Lactamase Producer (ESBL).  In bloodstream infections from ESBL organisms, carbapenems are preferred over piperacillin/tazobactam. They are shown to have a lower risk of mortality. FOR KLEBSIELLA PNEUMONIAE Performed at [facility], [address] [place], [place] [address]    Report Status 06/11/2021 FINAL  Final   Organism ID, Bacteria KLEBSIELLA PNEUMONIAE  Final   Organism ID, Bacteria STAPHYLOCOCCUS AUREUS  Final  Susceptibility   Klebsiella pneumoniae - MIC*    AMPICILLIN >=32 RESISTANT Resistant     CEFAZOLIN >=64 RESISTANT Resistant     CEFEPIME >=32 RESISTANT Resistant     CEFTAZIDIME RESISTANT Resistant     CEFTRIAXONE >=64 RESISTANT Resistant     CIPROFLOXACIN >=4 RESISTANT Resistant     GENTAMICIN >=16 RESISTANT Resistant     IMIPENEM <=0.25 SENSITIVE Sensitive     TRIMETH/SULFA >=320 RESISTANT Resistant     AMPICILLIN/SULBACTAM >=32 RESISTANT Resistant     PIP/TAZO 16 SENSITIVE Sensitive     * RARE KLEBSIELLA PNEUMONIAE   Staphylococcus aureus - MIC*    CIPROFLOXACIN <=0.5 SENSITIVE Sensitive     ERYTHROMYCIN <=0.25 SENSITIVE Sensitive     GENTAMICIN <=0.5 SENSITIVE Sensitive     OXACILLIN 0.5 SENSITIVE Sensitive     TETRACYCLINE <=1 SENSITIVE  Sensitive     VANCOMYCIN <=0.5 SENSITIVE Sensitive     TRIMETH/SULFA <=10 SENSITIVE Sensitive     CLINDAMYCIN <=0.25 SENSITIVE Sensitive     RIFAMPIN <=0.5 SENSITIVE Sensitive     Inducible Clindamycin NEGATIVE Sensitive     * ABUNDANT STAPHYLOCOCCUS AUREUS  Culture, blood (routine x 2)     Status: None   Collection Time: 06/10/21  9:20 AM   Specimen: BLOOD  Result Value Ref Range Status   Specimen Description BLOOD RIGHT ANTECUBITAL  Final   Special Requests   Final    BOTTLES DRAWN AEROBIC AND ANAEROBIC Blood Culture adequate volume   Culture   Final    NO GROWTH 5 DAYS Performed at [facility], [address], [place], [place] [address]    Report Status 06/15/2021 FINAL  Final  Culture, blood (routine x 2)     Status: None   Collection Time: 06/10/21  9:20 AM   Specimen: BLOOD LEFT HAND  Result Value Ref Range Status   Specimen Description BLOOD LEFT HAND  Final   Special Requests   Final    BOTTLES DRAWN AEROBIC AND ANAEROBIC Blood Culture adequate volume   Culture   Final    NO GROWTH 5 DAYS Performed at [facility], [address], [place], [place] [address]    Report Status 06/15/2021 FINAL  Final     Scheduled Meds:  arformoterol  15 mcg Nebulization BID   atorvastatin  40 mg Per Tube QHS   budesonide (PULMICORT) nebulizer solution  0.5 mg Nebulization BID   chlorhexidine  15 mL Mouth Rinse BID   docusate  200 mg Per Tube BID   feeding supplement (PROSource TF)  45 mL Per Tube TID   heparin  5,000 Units Subcutaneous Q8H   mouth rinse  15 mL Mouth Rinse q12n4p   metoprolol tartrate  50 mg Per Tube BID   pantoprazole sodium  40 mg Per Tube QHS   polyethylene glycol  17 g Per Tube Daily   revefenacin  175 mcg Nebulization Daily   senna  1 tablet Per Tube Daily   Continuous Infusions:  sodium chloride 250 mL (06/15/21 1719)   feeding supplement (JEVITY 1.5 CAL/FIBER) 1,000 mL (06/19/21 0626)   levETIRAcetam 500 mg (06/18/21 2112)    Active  Problems:   Cardiac arrest (HCC)   Acute respiratory failure with hypoxia (HCC)   Seizure-like activity (HCC)   Anoxic encephalopathy (HCC)   Tracheostomy in place [facility])   Goals of care, counseling/discussion   AKI (acute kidney injury) [facility])   Consultants: Neurology General surgery Interventional radiology PCCM  Procedures: 2D  echocardiogram Multiple EEGs Cortrak placement PEG tube placement  Antibiotics: Azithromycin 8/2 through 8/3 Cefepime 8/2 through 8/3 Ceftriaxone 8/3 through 8/7 Vancomycin 8/2 x 1 dose Zosyn 8/18 x 1 Unasyn 8/19 through 8/21 Cefazolin 8/24 through 8/24 Fluconazole 8/25 through 9/3 Zosyn 8/25 x 1 dose Cefepime 9/1 x 1 dose Vancomycin 9/1 x 1 dose Azithromycin 9/8 through 9/12 Zosyn 9/12 x 1 dose Meropenem 9/13 through/20   Time spent: 35 minutes    Junious Silk ANP  [facility] 7 am - 330 pm/M-F for direct patient care and secure chat Please refer to Amion for contact info 51  days

## 2021-06-19 NOTE — Plan of Care (Signed)
°  Problem: Clinical Measurements: °Goal: Will remain free from infection °Outcome: Progressing °Goal: Respiratory complications will improve °Outcome: Progressing °  °

## 2021-06-20 ENCOUNTER — Inpatient Hospital Stay (HOSPITAL_COMMUNITY): Payer: Medicaid Other

## 2021-06-20 DIAGNOSIS — J9601 Acute respiratory failure with hypoxia: Secondary | ICD-10-CM | POA: Diagnosis not present

## 2021-06-20 DIAGNOSIS — R569 Unspecified convulsions: Secondary | ICD-10-CM | POA: Diagnosis not present

## 2021-06-20 DIAGNOSIS — I469 Cardiac arrest, cause unspecified: Secondary | ICD-10-CM | POA: Diagnosis not present

## 2021-06-20 DIAGNOSIS — R403 Persistent vegetative state: Secondary | ICD-10-CM | POA: Diagnosis not present

## 2021-06-20 DIAGNOSIS — G931 Anoxic brain damage, not elsewhere classified: Secondary | ICD-10-CM | POA: Diagnosis not present

## 2021-06-20 DIAGNOSIS — Z930 Tracheostomy status: Secondary | ICD-10-CM | POA: Diagnosis not present

## 2021-06-20 LAB — GLUCOSE, CAPILLARY
Glucose-Capillary: 102 mg/dL — ABNORMAL HIGH (ref 70–99)
Glucose-Capillary: 111 mg/dL — ABNORMAL HIGH (ref 70–99)
Glucose-Capillary: 116 mg/dL — ABNORMAL HIGH (ref 70–99)
Glucose-Capillary: 123 mg/dL — ABNORMAL HIGH (ref 70–99)
Glucose-Capillary: 125 mg/dL — ABNORMAL HIGH (ref 70–99)
Glucose-Capillary: 127 mg/dL — ABNORMAL HIGH (ref 70–99)

## 2021-06-20 LAB — CBC WITH DIFFERENTIAL/PLATELET
Abs Immature Granulocytes: 0.1 10*3/uL — ABNORMAL HIGH (ref 0.00–0.07)
Basophils Absolute: 0 10*3/uL (ref 0.0–0.1)
Basophils Relative: 0 %
Eosinophils Absolute: 0.2 10*3/uL (ref 0.0–0.5)
Eosinophils Relative: 2 %
HCT: 36.3 % — ABNORMAL LOW (ref 39.0–52.0)
Hemoglobin: 12 g/dL — ABNORMAL LOW (ref 13.0–17.0)
Immature Granulocytes: 1 %
Lymphocytes Relative: 27 %
Lymphs Abs: 2.7 10*3/uL (ref 0.7–4.0)
MCH: 28.9 pg (ref 26.0–34.0)
MCHC: 33.1 g/dL (ref 30.0–36.0)
MCV: 87.5 fL (ref 80.0–100.0)
Monocytes Absolute: 0.9 10*3/uL (ref 0.1–1.0)
Monocytes Relative: 9 %
Neutro Abs: 6.1 10*3/uL (ref 1.7–7.7)
Neutrophils Relative %: 61 %
Platelets: 513 10*3/uL — ABNORMAL HIGH (ref 150–400)
RBC: 4.15 MIL/uL — ABNORMAL LOW (ref 4.22–5.81)
RDW: 15.2 % (ref 11.5–15.5)
WBC: 10.1 10*3/uL (ref 4.0–10.5)
nRBC: 0 % (ref 0.0–0.2)

## 2021-06-20 LAB — BASIC METABOLIC PANEL
Anion gap: 9 (ref 5–15)
BUN: 21 mg/dL — ABNORMAL HIGH (ref 6–20)
CO2: 24 mmol/L (ref 22–32)
Calcium: 9.5 mg/dL (ref 8.9–10.3)
Chloride: 102 mmol/L (ref 98–111)
Creatinine, Ser: 0.6 mg/dL — ABNORMAL LOW (ref 0.61–1.24)
GFR, Estimated: >60 mL/min (ref >60)
Glucose, Bld: 111 mg/dL — ABNORMAL HIGH (ref 70–99)
Potassium: 4.1 mmol/L (ref 3.5–5.1)
Sodium: 135 mmol/L (ref 135–145)

## 2021-06-20 LAB — LEVETIRACETAM LEVEL: Levetiracetam Lvl: 13.9 ug/mL (ref 10.0–40.0)

## 2021-06-20 MED ORDER — POLYVINYL ALCOHOL 1.4 % OP SOLN
2.0000 [drp] | Freq: Four times a day (QID) | OPHTHALMIC | Status: DC
  Administered 2021-06-20 – 2021-07-22 (×121): 2 [drp] via OPHTHALMIC
  Filled 2021-06-20 (×2): qty 15

## 2021-06-20 NOTE — Progress Notes (Signed)
[facility] PROGRESS NOTE  Avin Saige Busby VFI:[id number] DOB: Apr 10, 1971 DOA: 04/29/2021 PCP: Pcp, No  Status: Remains inpatient appropriate because:Altered mental status and Unsafe d/c plan  Dispo: The patient is from: Home              Anticipated d/c is to: SNF-trach capable              Patient currently is medically stable to d/c.  Difficult to place patient Yes               Barriers to discharge: Medicaid funding- permanent trach  Level of care: Progressive   Code Status: DNR Family Communication:  DVT prophylaxis: Subcutaneous heparin COVID vaccination status: Unknown  HPI:  50 y.o. 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  Blinking repetitively.  No response.  Objective: Vitals:   06/20/21 0818 06/20/21 0819  BP:    Pulse:    Resp:    Temp:    SpO2: 100% 100%    Intake/Output Summary (Last 24 hours) at 06/20/2021 0820 Last data filed at 06/20/2021 0100 Gross per 24 hour  Intake 1070 ml  Output 1150 ml  Net -80 ml   Filed Weights   06/03/21 0500 06/04/21 0500 06/08/21 0353  Weight: 68.3 kg 68.4 kg 63.2 kg    Exam:  Constitutional: NAD, calm, comfortable Respiratory: #6.0 cuffed Shiley trach to trach collar, lungs remain clear, no increased respiratory effort.  Room air Cardiovascular: Mains regular, heart sounds S1-S2, no peripheral edema, skin warm and dry Abdomen: no tenderness, soft, G-tube in place for feedings, LBM 9/22Bowel sounds positive.  Musculoskeletal: no clubbing / cyanosis. No joint deformity upper and lower extremities. Good ROM, no contractures. Normal muscle tone.  Skin: no rashes, lesions, ulcers. No induration Neurologic: CN 2-12 appear to be grossly  intact on visual inspection.  Does not flinch with painful stimulus applied to arms or legs-no purposeful movement detected Psychiatric: Open, repetitive blinking, not tracking movement around the room.  Significant Events: 8/2 PEA Arrest, King airway in the field, CPR x6 minutes and epi x1, ROSC, Intubated in ED, Admitted to [facility]  8/24 PEG placed 8/4 Weaned off TTM, Coox O2 sat of 47%. 8/8 Post pyloric Cortrak placed, myoclonic jerking, started Keppra, depakote 8/10 Completed antibiotic course for pneumonia. Family meeting transitioned to DNR. 8/15 Tracheostomy  8/18 Febrile overnight, started Zosyn for aspiration pneumonia 8/22 Completed Abx course for aspiration pneumonia  8/24 PEG placed 8/31 PSV wean 2 hours 9/1 Stopped lasix, farxiga, losartan, spiro with AKI. Ongoing jerking, diaphoresis. WBC increased, cultured / abx started empirically.  PCT increased. CK 2,450 9/2 start scheduled opiates for possible opiate withdrawal symptoms 9/4 add flexeril, on PSV.  Versed x1 overnight. 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  EEG without active seizure activity noted Repetitive blinking concerning for seizure activity therefore EEG ordered Check levetiracetam level not yet collected  Acute respiratory failure with hypoxia in the setting of cardiac arrest and aspiration pneumonia: Pain stable on trach collar Trach team/PCCM following but given  his persistent vegetative state he is not a candidate for decannulation Continue inhalers.  Sepsis due to ESBL Klebsiella in sputum culture: Blood culture showed Klebsiella and MSSA, he did complete a course of antibiotics.   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 Tolerating Jevity tube feeding at 60 cc/h Avoid narcotics and sedatives.  Acute kidney injury: Resolved.  Hypovolemic hyponatremia: Resolved.  Continue free water flushes per tube  Acute combined systolic and diastolic heart failure: Echocardiogram August 2022 demonstrated severely depressed LV function with an EF < 20%.  Has associated grade 2 diastolic dysfunction that is restrictive in nature and has associated pulmonary hypertension 40 mmHg Continue Lopressor Lasix as needed   Opiate withdrawals: Resolved.  Nutrition: Status post PEG tube placement continue tube feedings and free water flushes.  Left upper lung nodule: Will need a follow-up CT in 3 months.  Hyperlipidemia: Continue Lipitor.   Goals of care: Persistent encephalopathy guarded prognosis palliative care was consulted sister was adamant about continuing full scope of treatment. The patient remains a DNR.      Data Reviewed: Basic Metabolic Panel: Recent Labs  Lab 06/16/21 1150 06/17/21 0204 06/18/21 0301 06/19/21 0228 06/20/21 0059  NA 135 134* 133* 133* 135  K 4.3 4.0 4.4 4.6 4.1  CL 101 101 104 103 102  CO2 22 21* 18* 19* 24  GLUCOSE 119* 111* 112* 97 111*  BUN 23* 23* 24* 26* 21*  CREATININE 0.73 0.73 0.64 0.59* 0.60*  CALCIUM 9.5 9.6 9.1 9.3 9.5   Liver Function Tests: No results for input(s): AST, ALT, ALKPHOS, BILITOT, PROT, ALBUMIN in the last 168 hours. No results for input(s): LIPASE, AMYLASE in the last 168 hours. No results for input(s): AMMONIA in the last 168 hours. CBC: Recent Labs  Lab 06/16/21 1150 06/17/21 0204 06/18/21 0301 06/19/21 0228 06/20/21 0059  WBC 13.7* 14.2* 13.1* 12.3* 10.1  NEUTROABS 9.3* 9.7* 8.3* 7.2 6.1  HGB 12.4* 12.1* 11.6* 12.0* 12.0*  HCT 38.0* 37.6* 35.9* 37.0* 36.3*  MCV 88.2 87.9 88.4 90.2 87.5  PLT 302 359 373 421* 513*    BNP (last 3 results) Recent Labs    04/29/21 2048 06/09/21 0350  BNP 626.7*  406.5*    CBG: Recent Labs  Lab 06/19/21 1549 06/19/21 2012 06/20/21 0004 06/20/21 0418 06/20/21 0744  GLUCAP 157* 96 123* 127* 125*    Recent Results (from the past 240 hour(s))  Culture, blood (routine x 2)     Status: None   Collection Time: 06/10/21  9:20 AM   Specimen: BLOOD  Result Value Ref Range Status   Specimen Description BLOOD RIGHT ANTECUBITAL  Final   Special Requests   Final    BOTTLES DRAWN AEROBIC AND ANAEROBIC Blood Culture adequate volume   Culture   Final    NO GROWTH 5 DAYS Performed at [facility], [address], [place], [place] [address]    Report Status 06/15/2021 FINAL  Final  Culture, blood (routine x 2)     Status: None   Collection Time: 06/10/21  9:20 AM   Specimen: BLOOD LEFT HAND  Result Value Ref Range Status   Specimen Description BLOOD LEFT HAND  Final   Special Requests   Final    BOTTLES DRAWN AEROBIC AND ANAEROBIC Blood Culture adequate volume   Culture   Final    NO GROWTH 5 DAYS Performed at [facility], [address], [place], [place] [address]  Report Status 06/15/2021 FINAL  Final     Scheduled Meds:  arformoterol  15 mcg Nebulization BID   atorvastatin  40 mg Per Tube QHS   budesonide (PULMICORT) nebulizer solution  0.5 mg Nebulization BID   chlorhexidine  15 mL Mouth Rinse BID   docusate  200 mg Per Tube BID   feeding supplement (PROSource TF)  45 mL Per Tube TID   heparin  5,000 Units Subcutaneous Q8H   levETIRAcetam  1,000 mg Per Tube BID   mouth rinse  15 mL Mouth Rinse q12n4p   metoprolol tartrate  50 mg Per Tube BID   pantoprazole sodium  40 mg Per Tube QHS   polyethylene glycol  17 g Per Tube Daily   revefenacin  175 mcg Nebulization Daily   senna  1 tablet Per Tube Daily   Continuous Infusions:  sodium chloride 250 mL (06/15/21 1719)   feeding supplement (JEVITY 1.5 CAL/FIBER) 1,000 mL (06/20/21 0130)    Active Problems:   Cardiac arrest (HCC)   Acute respiratory failure with hypoxia  (HCC)   Seizure-like activity (HCC)   Anoxic encephalopathy (HCC)   Tracheostomy in place [facility])   Goals of care, counseling/discussion   AKI (acute kidney injury) (HCC)   Tracheostomy dependence (HCC)   Persistent vegetative state [facility])   Consultants: Neurology General surgery Interventional radiology PCCM  Procedures: 2D echocardiogram Multiple EEGs Cortrak placement PEG tube placement  Antibiotics: Azithromycin 8/2 through 8/3 Cefepime 8/2 through 8/3 Ceftriaxone 8/3 through 8/7 Vancomycin 8/2 x 1 dose Zosyn 8/18 x 1 Unasyn 8/19 through 8/21 Cefazolin 8/24 through 8/24 Fluconazole 8/25 through 9/3 Zosyn 8/25 x 1 dose Cefepime 9/1 x 1 dose Vancomycin 9/1 x 1 dose Azithromycin 9/8 through 9/12 Zosyn 9/12 x 1 dose Meropenem 9/13 through/20   Time spent: 35 minutes    Junious Silk ANP  [facility] 7 am - 330 pm/M-F for direct patient care and secure chat Please refer to Amion for contact info 52  days

## 2021-06-20 NOTE — Progress Notes (Signed)
Palliative Medicine RN Note: Discussed in team rounds.  Family has been abundantly clear about GOC, and PMT does not have more to offer at this time. Note that pt is on "difficult to place" list, and TOC is working hard to resolve this.  If Vincent [name]'s family requests our re-involvement to discuss changing GOC, please re-consult [place] and call our office at [phone]. At this time we will sign off.  Vincent Chance Breccan Galant, RN, BSN, [facility] Palliative Medicine Team 06/20/2021 11:13 AM Office [phone]

## 2021-06-20 NOTE — Progress Notes (Signed)
EEG done at bedside. Results pending. Trouble with Internet connection = restart study.

## 2021-06-20 NOTE — Plan of Care (Signed)

## 2021-06-20 NOTE — Progress Notes (Signed)
CSW spoke with Isabelle Course of [facility] who states she has not received a referral for this patient.  CSW sent clinicals via secure e-mail for review.  Edwin Dada, MSW, LCSW Transitions of Care  Clinical Social Worker II [phone]

## 2021-06-20 NOTE — Procedures (Signed)
Routine EEG Report  Vincent [name] is a 50 y.o. male with a history of cardiac arrest and spells who is undergoing an EEG to evaluate for seizures.  Report: This EEG was acquired with electrodes placed according to the International 10-20 electrode system (including Fp1, Fp2, F3, F4, C3, C4, P3, P4, O1, O2, T3, T4, T5, T6, A1, A2, Fz, Cz, Pz). The following electrodes were missing or displaced: none.  The occipital dominant rhythm was 6 Hz. This activity is reactive to stimulation. There was no sleep architecture observed. There was no focal slowing. There were no interictal epileptiform discharges. There were no electrographic seizures identified. Photic stimulation and hyperventilation were not performed.  Impression and clinical correlation: This EEG was obtained while patient was obtunded and is abnormal due to mild diffuse slowing indicative of global cerebral dysfunction. Patient was blinking near continuously during most of the recording, but these runs did not have an ictal correlate on EEG. Focal motor seizures may involve too little cortical surface area to be consistently picked up by EEG, therefore if consider prolonged EEG if there is high clinical concern for seizures.  Bing Neighbors, MD [facility] [phone]  If 7pm- 7am, please page neurology on call as listed in AMION.

## 2021-06-21 DIAGNOSIS — I469 Cardiac arrest, cause unspecified: Secondary | ICD-10-CM | POA: Diagnosis not present

## 2021-06-21 LAB — BASIC METABOLIC PANEL
Anion gap: 9 (ref 5–15)
BUN: 20 mg/dL (ref 6–20)
CO2: 25 mmol/L (ref 22–32)
Calcium: 9.8 mg/dL (ref 8.9–10.3)
Chloride: 99 mmol/L (ref 98–111)
Creatinine, Ser: 0.6 mg/dL — ABNORMAL LOW (ref 0.61–1.24)
GFR, Estimated: >60 mL/min (ref >60)
Glucose, Bld: 122 mg/dL — ABNORMAL HIGH (ref 70–99)
Potassium: 4.4 mmol/L (ref 3.5–5.1)
Sodium: 133 mmol/L — ABNORMAL LOW (ref 135–145)

## 2021-06-21 LAB — CBC WITH DIFFERENTIAL/PLATELET
Abs Immature Granulocytes: 0.1 10*3/uL — ABNORMAL HIGH (ref 0.00–0.07)
Basophils Absolute: 0 10*3/uL (ref 0.0–0.1)
Basophils Relative: 0 %
Eosinophils Absolute: 0.2 10*3/uL (ref 0.0–0.5)
Eosinophils Relative: 2 %
HCT: 37.2 % — ABNORMAL LOW (ref 39.0–52.0)
Hemoglobin: 12 g/dL — ABNORMAL LOW (ref 13.0–17.0)
Immature Granulocytes: 1 %
Lymphocytes Relative: 31 %
Lymphs Abs: 3.1 10*3/uL (ref 0.7–4.0)
MCH: 28.2 pg (ref 26.0–34.0)
MCHC: 32.3 g/dL (ref 30.0–36.0)
MCV: 87.5 fL (ref 80.0–100.0)
Monocytes Absolute: 0.8 10*3/uL (ref 0.1–1.0)
Monocytes Relative: 8 %
Neutro Abs: 5.6 10*3/uL (ref 1.7–7.7)
Neutrophils Relative %: 58 %
Platelets: 504 10*3/uL — ABNORMAL HIGH (ref 150–400)
RBC: 4.25 MIL/uL (ref 4.22–5.81)
RDW: 15.2 % (ref 11.5–15.5)
WBC: 9.8 10*3/uL (ref 4.0–10.5)
nRBC: 0 % (ref 0.0–0.2)

## 2021-06-21 LAB — GLUCOSE, CAPILLARY
Glucose-Capillary: 104 mg/dL — ABNORMAL HIGH (ref 70–99)
Glucose-Capillary: 122 mg/dL — ABNORMAL HIGH (ref 70–99)
Glucose-Capillary: 123 mg/dL — ABNORMAL HIGH (ref 70–99)
Glucose-Capillary: 137 mg/dL — ABNORMAL HIGH (ref 70–99)
Glucose-Capillary: 143 mg/dL — ABNORMAL HIGH (ref 70–99)
Glucose-Capillary: 95 mg/dL (ref 70–99)

## 2021-06-21 NOTE — Progress Notes (Signed)
[facility] PROGRESS NOTE    Progress Note  Vincent [name]  MBT:[id number] DOB: [birth year] DOA: 04/29/2021 PCP: Pcp, No     Brief Narrative:   Vincent [name] is an 50 y.o. 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 8/8 Post pyloric Cortrak placed, myoclonic jerking, started Keppra, depakote 8/10 Completed antibiotic course for pneumonia. Family meeting transitioned to DNR. 8/15 Tracheostomy  8/18 Febrile overnight, started Zosyn for aspiration pneumonia 8/22 Completed Abx course for aspiration pneumonia  8/24 PEG placed 8/31 PSV wean 2 hours 9/1 Stopped lasix, farxiga, losartan, spiro with AKI. Ongoing jerking, diaphoresis. WBC increased, cultured / abx started empirically.  PCT increased. CK 2,450 9/2 start scheduled opiates for possible opiate withdrawal symptoms 9/4 add flexeril, on PSV.  Versed x1 overnight. 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 EEG ordered that showed no signs of seizures.  Acute respiratory failure with hypoxia in the setting of cardiac arrest and aspiration pneumonia: He was heart to wean off the ventilator as he has emphysema, PCCM is following he is currently trach. Continue inhalers.  Sepsis due to ESBL Klebsiella in sputum culture: Blood  culture showed Klebsiella and MSSA, he did complete a course of antibiotics.  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 He has been started on tube feedings on 06/17/2021 we will increase slowly. Avoid narcotics and sedatives.  Acute kidney injury: Resolved.  Hypovolemic hyponatremia: Resolved.  Continue free water flushes per tube  Acute combined systolic and diastolic heart failure: Continue Lopressor Lasix as needed  Opiate withdrawals: Resolved.  Nutrition: Status post PEG tube placement continue tube feedings and free water flushes.  Left upper lung nodule: Will need a follow-up CT in 3 months.  Hyperlipidemia: Continue Lipitor.  Goals of care: Persistent encephalopathy guarded prognosis palliative care was consulted sister was adamant about continuing full scope of treatment. The patient remains a DNR.  DVT prophylaxis: lovenxo Family Communication:n4on Status is: Inpatient  Remains inpatient appropriate because:Hemodynamically unstable  Dispo: The patient is from: Home              Anticipated d/c is to: Home              Patient currently is not medically stable to d/c.   Difficult to place patient No        Code Status:     Code Status Orders  (From admission, onward)           Start     Ordered   05/07/21 1633  Do not attempt resuscitation (DNR)  Continuous       Question Answer Comment  In the event of cardiac or respiratory ARREST Do not call a "code blue"   In the event of cardiac or respiratory ARREST Do not perform Intubation,  CPR, defibrillation or ACLS   In the event of cardiac or respiratory ARREST Use medication by any route, position, wound care, and other measures to relive pain and suffering. May use oxygen, suction and manual treatment of airway obstruction as needed for comfort.      05/07/21 1632           Code Status History     Date Active Date Inactive Code  Status Order ID Comments User Context   04/29/2021 2229 05/07/2021 1632 Full Code [id number]  Kathlene Cote, PA-C ED         IV Access:   Peripheral IV   Procedures and diagnostic studies:   EEG adult  Result Date: 06/20/2021 Jefferson Fuel, MD     06/20/2021  5:55 PM Routine EEG Report Vincent [name] is a 51 y.o. male with a history of cardiac arrest and spells who is undergoing an EEG to evaluate for seizures. Report: This EEG was acquired with electrodes placed according to the International 10-20 electrode system (including Fp1, Fp2, F3, F4, C3, C4, P3, P4, O1, O2, T3, T4, T5, T6, A1, A2, Fz, Cz, Pz). The following electrodes were missing or displaced: none. The occipital dominant rhythm was 6 Hz. This activity is reactive to stimulation. There was no sleep architecture observed. There was no focal slowing. There were no interictal epileptiform discharges. There were no electrographic seizures identified. Photic stimulation and hyperventilation were not performed. Impression and clinical correlation: This EEG was obtained while patient was obtunded and is abnormal due to mild diffuse slowing indicative of global cerebral dysfunction. Patient was blinking near continuously during most of the recording, but these runs did not have an ictal correlate on EEG. Focal motor seizures may involve too little cortical surface area to be consistently picked up by EEG, therefore if consider prolonged EEG if there is high clinical concern for seizures. Bing Neighbors, MD [facility] [phone] If 7pm- 7am, please page neurology on call as listed in AMION.     Medical Consultants:   None.   Subjective:    Vincent [name] unresponsive nonverbal  Objective:    Vitals:   06/21/21 0413 06/21/21 0755 06/21/21 0800 06/21/21 0847  BP: 113/84  137/74 137/74  Pulse: (!) 103  (!) 113 99  Resp: (!) 23   (!) 26  Temp: 99.4 F (37.4 C) 97.7 F (36.5 C)    TempSrc:  Axillary Oral    SpO2: 98%   100%  Weight:      Height:       SpO2: 100 % O2 Flow Rate (L/min): 5 L/min FiO2 (%): 21 %   Intake/Output Summary (Last 24 hours) at 06/21/2021 0959 Last data filed at 06/21/2021 0414 Gross per 24 hour  Intake --  Output 1100 ml  Net -1100 ml    Filed Weights   06/03/21 0500 06/04/21 0500 06/08/21 0353  Weight: 68.3 kg 68.4 kg 63.2 kg    Exam: General exam: In no acute distress. Respiratory system: Good air movement and clear to auscultation. Cardiovascular system: S1 & S2 heard, RRR. No JVD. Gastrointestinal system: Abdomen is nondistended, soft and nontender.  Extremities: No pedal edema. Skin: No rashes, lesions or ulcers  Data Reviewed:    Labs: Basic Metabolic Panel: Recent Labs  Lab 06/17/21 0204 06/18/21 0301 06/19/21 0228 06/20/21 0059 06/21/21 0238  NA 134* 133* 133* 135 133*  K 4.0 4.4 4.6 4.1 4.4  CL 101 104 103 102 99  CO2 21*  18* 19* 24 25  GLUCOSE 111* 112* 97 111* 122*  BUN 23* 24* 26* 21* 20  CREATININE 0.73 0.64 0.59* 0.60* 0.60*  CALCIUM 9.6 9.1 9.3 9.5 9.8    GFR Estimated Creatinine Clearance: 97.7 mL/min (A) (by C-G formula based on SCr of 0.6 mg/dL (L)). Liver Function Tests: No results for input(s): AST, ALT, ALKPHOS, BILITOT, PROT, ALBUMIN in the last 168 hours. No results for input(s): LIPASE, AMYLASE in the last 168 hours. No results for input(s): AMMONIA in the last 168 hours. Coagulation profile No results for input(s): INR, PROTIME in the last 168 hours. COVID-19 Labs  No results for input(s): DDIMER, FERRITIN, LDH, CRP in the last 72 hours.  Lab Results  Component Value Date   SARSCOV2NAA NEGATIVE 04/29/2021    CBC: Recent Labs  Lab 06/17/21 0204 06/18/21 0301 06/19/21 0228 06/20/21 0059 06/21/21 0238  WBC 14.2* 13.1* 12.3* 10.1 9.8  NEUTROABS 9.7* 8.3* 7.2 6.1 5.6  HGB 12.1* 11.6* 12.0* 12.0* 12.0*  HCT 37.6* 35.9* 37.0* 36.3* 37.2*  MCV 87.9 88.4 90.2 87.5 87.5  PLT 359 373  421* 513* 504*    Cardiac Enzymes: No results for input(s): CKTOTAL, CKMB, CKMBINDEX, TROPONINI in the last 168 hours. BNP (last 3 results) No results for input(s): PROBNP in the last 8760 hours. CBG: Recent Labs  Lab 06/20/21 1628 06/20/21 1959 06/21/21 0005 06/21/21 0409 06/21/21 0751  GLUCAP 116* 102* 122* 123* 143*    D-Dimer: No results for input(s): DDIMER in the last 72 hours. Hgb A1c: No results for input(s): HGBA1C in the last 72 hours. Lipid Profile: No results for input(s): CHOL, HDL, LDLCALC, TRIG, CHOLHDL, LDLDIRECT in the last 72 hours. Thyroid function studies: No results for input(s): TSH, T4TOTAL, T3FREE, THYROIDAB in the last 72 hours.  Invalid input(s): FREET3 Anemia work up: No results for input(s): VITAMINB12, FOLATE, FERRITIN, TIBC, IRON, RETICCTPCT in the last 72 hours. Sepsis Labs: Recent Labs  Lab 06/18/21 0301 06/19/21 0228 06/20/21 0059 06/21/21 0238  WBC 13.1* 12.3* 10.1 9.8    Microbiology No results found for this or any previous visit (from the past 240 hour(s)).    Medications:    arformoterol  15 mcg Nebulization BID   atorvastatin  40 mg Per Tube QHS   budesonide (PULMICORT) nebulizer solution  0.5 mg Nebulization BID   chlorhexidine  15 mL Mouth Rinse BID   docusate  200 mg Per Tube BID   feeding supplement (PROSource TF)  45 mL Per Tube TID   heparin  5,000 Units Subcutaneous Q8H   levETIRAcetam  1,000 mg Per Tube BID   mouth rinse  15 mL Mouth Rinse q12n4p   metoprolol tartrate  50 mg Per Tube BID   pantoprazole sodium  40 mg Per Tube QHS   polyethylene glycol  17 g Per Tube Daily   polyvinyl alcohol  2 drop Both Eyes Q6H   revefenacin  175 mcg Nebulization Daily   senna  1 tablet Per Tube Daily   Continuous Infusions:  sodium chloride 250 mL (06/15/21 1719)   feeding supplement (JEVITY 1.5 CAL/FIBER) 60 mL/hr at 06/21/21 0724      LOS: 53 days   Marinda Elk  [facility]  06/21/2021, 9:59 AM

## 2021-06-22 DIAGNOSIS — I469 Cardiac arrest, cause unspecified: Secondary | ICD-10-CM | POA: Diagnosis not present

## 2021-06-22 LAB — CBC WITH DIFFERENTIAL/PLATELET
Abs Immature Granulocytes: 0.09 10*3/uL — ABNORMAL HIGH (ref 0.00–0.07)
Basophils Absolute: 0.1 10*3/uL (ref 0.0–0.1)
Basophils Relative: 0 %
Eosinophils Absolute: 0.2 10*3/uL (ref 0.0–0.5)
Eosinophils Relative: 2 %
HCT: 39.5 % (ref 39.0–52.0)
Hemoglobin: 13.1 g/dL (ref 13.0–17.0)
Immature Granulocytes: 1 %
Lymphocytes Relative: 30 %
Lymphs Abs: 3.5 10*3/uL (ref 0.7–4.0)
MCH: 28.9 pg (ref 26.0–34.0)
MCHC: 33.2 g/dL (ref 30.0–36.0)
MCV: 87 fL (ref 80.0–100.0)
Monocytes Absolute: 0.9 10*3/uL (ref 0.1–1.0)
Monocytes Relative: 8 %
Neutro Abs: 6.8 10*3/uL (ref 1.7–7.7)
Neutrophils Relative %: 59 %
Platelets: 592 10*3/uL — ABNORMAL HIGH (ref 150–400)
RBC: 4.54 MIL/uL (ref 4.22–5.81)
RDW: 15.2 % (ref 11.5–15.5)
WBC: 11.6 10*3/uL — ABNORMAL HIGH (ref 4.0–10.5)
nRBC: 0 % (ref 0.0–0.2)

## 2021-06-22 LAB — GLUCOSE, CAPILLARY
Glucose-Capillary: 128 mg/dL — ABNORMAL HIGH (ref 70–99)
Glucose-Capillary: 133 mg/dL — ABNORMAL HIGH (ref 70–99)
Glucose-Capillary: 135 mg/dL — ABNORMAL HIGH (ref 70–99)
Glucose-Capillary: 147 mg/dL — ABNORMAL HIGH (ref 70–99)
Glucose-Capillary: 148 mg/dL — ABNORMAL HIGH (ref 70–99)
Glucose-Capillary: 149 mg/dL — ABNORMAL HIGH (ref 70–99)

## 2021-06-22 NOTE — Progress Notes (Signed)
[facility] PROGRESS NOTE    Progress Note  Vincent [name]  ZOX:[id number] DOB: Apr 01, 1971 DOA: 04/29/2021 PCP: Pcp, No     Brief Narrative:   Vincent [name] is an 50 y.o. 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 8/8 Post pyloric Cortrak placed, myoclonic jerking, started Keppra, depakote 8/10 Completed antibiotic course for pneumonia. Family meeting transitioned to DNR. 8/15 Tracheostomy  8/18 Febrile overnight, started Zosyn for aspiration pneumonia 8/22 Completed Abx course for aspiration pneumonia  8/24 PEG placed 8/31 PSV wean 2 hours 9/1 Stopped lasix, farxiga, losartan, spiro with AKI. Ongoing jerking, diaphoresis. WBC increased, cultured / abx started empirically.  PCT increased. CK 2,450 9/2 start scheduled opiates for possible opiate withdrawal symptoms 9/4 add flexeril, on PSV.  Versed x1 overnight. 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 EEG ordered that showed no signs of seizures.  Acute respiratory failure with hypoxia in the setting of cardiac arrest and aspiration pneumonia: He was heart to wean off the ventilator as he has emphysema, PCCM is following he is currently trach. Continue inhalers.  Sepsis due to ESBL Klebsiella in sputum culture: Blood  culture showed Klebsiella and MSSA, he did complete a course of antibiotics.  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 He has been started on tube feedings on 06/17/2021 we will increase slowly. Avoid narcotics and sedatives.  Acute kidney injury: Resolved.  Hypovolemic hyponatremia: Resolved.  Continue free water flushes per tube  Acute combined systolic and diastolic heart failure: Continue Lopressor Lasix as needed  Opiate withdrawals: Resolved.  Nutrition: Status post PEG tube placement continue tube feedings and free water flushes.  Left upper lung nodule: Will need a follow-up CT in 3 months.  Hyperlipidemia: Continue Lipitor.  Goals of care: Persistent encephalopathy guarded prognosis palliative care was consulted sister was adamant about continuing full scope of treatment. The patient remains a DNR.  DVT prophylaxis: lovenxo Family Communication:n4on Status is: Inpatient  Remains inpatient appropriate because:Hemodynamically unstable  Dispo: The patient is from: Home              Anticipated d/c is to: Home              Patient currently is not medically stable to d/c.   Difficult to place patient No        Code Status:     Code Status Orders  (From admission, onward)           Start     Ordered   05/07/21 1633  Do not attempt resuscitation (DNR)  Continuous       Question Answer Comment  In the event of cardiac or respiratory ARREST Do not call a "code blue"   In the event of cardiac or respiratory ARREST Do not perform Intubation,  CPR, defibrillation or ACLS   In the event of cardiac or respiratory ARREST Use medication by any route, position, wound care, and other measures to relive pain and suffering. May use oxygen, suction and manual treatment of airway obstruction as needed for comfort.      05/07/21 1632           Code Status History     Date Active Date Inactive Code  Status Order ID Comments User Context   04/29/2021 2229 05/07/2021 1632 Full Code [id number]  Kathlene Cote, PA-C ED         IV Access:   Peripheral IV   Procedures and diagnostic studies:   EEG adult  Result Date: 06/20/2021 Jefferson Fuel, MD     06/20/2021  5:55 PM Routine EEG Report Vincent [name] is a 50 y.o. male with a history of cardiac arrest and spells who is undergoing an EEG to evaluate for seizures. Report: This EEG was acquired with electrodes placed according to the International 10-20 electrode system (including Fp1, Fp2, F3, F4, C3, C4, P3, P4, O1, O2, T3, T4, T5, T6, A1, A2, Fz, Cz, Pz). The following electrodes were missing or displaced: none. The occipital dominant rhythm was 6 Hz. This activity is reactive to stimulation. There was no sleep architecture observed. There was no focal slowing. There were no interictal epileptiform discharges. There were no electrographic seizures identified. Photic stimulation and hyperventilation were not performed. Impression and clinical correlation: This EEG was obtained while patient was obtunded and is abnormal due to mild diffuse slowing indicative of global cerebral dysfunction. Patient was blinking near continuously during most of the recording, but these runs did not have an ictal correlate on EEG. Focal motor seizures may involve too little cortical surface area to be consistently picked up by EEG, therefore if consider prolonged EEG if there is high clinical concern for seizures. Bing Neighbors, MD [facility] [phone] If 7pm- 7am, please page neurology on call as listed in AMION.     Medical Consultants:   None.   Subjective:    Vincent [name] nonverbal unresponsive  Objective:    Vitals:   06/22/21 0315 06/22/21 0425 06/22/21 0755 06/22/21 0810  BP:  (!) 150/98 (!) 140/102   Pulse: (!) 111  (!) 115   Resp:  (!) 26 (!) 24 (!) 24  Temp:  98 F (36.7 C)    TempSrc:  Axillary    SpO2:  100%  100%   Weight:      Height:       SpO2: 100 % O2 Flow Rate (L/min): 5 L/min FiO2 (%): 21 %   Intake/Output Summary (Last 24 hours) at 06/22/2021 0900 Last data filed at 06/22/2021 0435 Gross per 24 hour  Intake --  Output 450 ml  Net -450 ml    Filed Weights   06/03/21 0500 06/04/21 0500 06/08/21 0353  Weight: 68.3 kg 68.4 kg 63.2 kg    Exam: General exam: In no acute distress. Respiratory system: Good air movement and clear to auscultation. Cardiovascular system: S1 & S2 heard, RRR. No JVD. Gastrointestinal system: Abdomen is nondistended, soft and nontender.  Extremities: No pedal edema. Skin: No rashes, lesions or ulcers  Data Reviewed:    Labs: Basic Metabolic Panel: Recent Labs  Lab 06/17/21 0204 06/18/21 0301 06/19/21 0228 06/20/21 0059 06/21/21 0238  NA 134* 133* 133* 135 133*  K 4.0 4.4 4.6 4.1 4.4  CL 101 104 103 102 99  CO2 21*  18* 19* 24 25  GLUCOSE 111* 112* 97 111* 122*  BUN 23* 24* 26* 21* 20  CREATININE 0.73 0.64 0.59* 0.60* 0.60*  CALCIUM 9.6 9.1 9.3 9.5 9.8    GFR Estimated Creatinine Clearance: 97.7 mL/min (A) (by C-G formula based on SCr of 0.6 mg/dL (L)). Liver Function Tests: No results for input(s): AST, ALT, ALKPHOS, BILITOT, PROT, ALBUMIN in the last 168 hours. No results for input(s): LIPASE, AMYLASE in the last 168 hours. No results for input(s): AMMONIA in the last 168 hours. Coagulation profile No results for input(s): INR, PROTIME in the last 168 hours. COVID-19 Labs  No results for input(s): DDIMER, FERRITIN, LDH, CRP in the last 72 hours.  Lab Results  Component Value Date   SARSCOV2NAA NEGATIVE 04/29/2021    CBC: Recent Labs  Lab 06/18/21 0301 06/19/21 0228 06/20/21 0059 06/21/21 0238 06/22/21 0159  WBC 13.1* 12.3* 10.1 9.8 11.6*  NEUTROABS 8.3* 7.2 6.1 5.6 6.8  HGB 11.6* 12.0* 12.0* 12.0* 13.1  HCT 35.9* 37.0* 36.3* 37.2* 39.5  MCV 88.4 90.2 87.5 87.5 87.0  PLT 373 421* 513* 504* 592*    Cardiac  Enzymes: No results for input(s): CKTOTAL, CKMB, CKMBINDEX, TROPONINI in the last 168 hours. BNP (last 3 results) No results for input(s): PROBNP in the last 8760 hours. CBG: Recent Labs  Lab 06/21/21 1653 06/21/21 2024 06/21/21 2354 06/22/21 0422 06/22/21 0753  GLUCAP 104* 95 128* 148* 149*    D-Dimer: No results for input(s): DDIMER in the last 72 hours. Hgb A1c: No results for input(s): HGBA1C in the last 72 hours. Lipid Profile: No results for input(s): CHOL, HDL, LDLCALC, TRIG, CHOLHDL, LDLDIRECT in the last 72 hours. Thyroid function studies: No results for input(s): TSH, T4TOTAL, T3FREE, THYROIDAB in the last 72 hours.  Invalid input(s): FREET3 Anemia work up: No results for input(s): VITAMINB12, FOLATE, FERRITIN, TIBC, IRON, RETICCTPCT in the last 72 hours. Sepsis Labs: Recent Labs  Lab 06/19/21 0228 06/20/21 0059 06/21/21 0238 06/22/21 0159  WBC 12.3* 10.1 9.8 11.6*    Microbiology No results found for this or any previous visit (from the past 240 hour(s)).    Medications:    arformoterol  15 mcg Nebulization BID   atorvastatin  40 mg Per Tube QHS   budesonide (PULMICORT) nebulizer solution  0.5 mg Nebulization BID   chlorhexidine  15 mL Mouth Rinse BID   docusate  200 mg Per Tube BID   feeding supplement (PROSource TF)  45 mL Per Tube TID   heparin  5,000 Units Subcutaneous Q8H   levETIRAcetam  1,000 mg Per Tube BID   mouth rinse  15 mL Mouth Rinse q12n4p   metoprolol tartrate  50 mg Per Tube BID   pantoprazole sodium  40 mg Per Tube QHS   polyethylene glycol  17 g Per Tube Daily   polyvinyl alcohol  2 drop Both Eyes Q6H   revefenacin  175 mcg Nebulization Daily   senna  1 tablet Per Tube Daily   Continuous Infusions:  sodium chloride 250 mL (06/15/21 1719)   feeding supplement (JEVITY 1.5 CAL/FIBER) 1,000 mL (06/21/21 1705)      LOS: 54 days   Marinda Elk  [facility]  06/22/2021, 9:00 AM

## 2021-06-22 NOTE — Plan of Care (Signed)
  Problem: Clinical Measurements: Goal: Will remain free from infection Outcome: Progressing Goal: Respiratory complications will improve Outcome: Progressing Goal: Cardiovascular complication will be avoided Outcome: Progressing   Problem: Activity: Goal: Risk for activity intolerance will decrease Outcome: Progressing   

## 2021-06-23 DIAGNOSIS — Z930 Tracheostomy status: Secondary | ICD-10-CM | POA: Diagnosis not present

## 2021-06-23 DIAGNOSIS — I469 Cardiac arrest, cause unspecified: Secondary | ICD-10-CM | POA: Diagnosis not present

## 2021-06-23 DIAGNOSIS — R569 Unspecified convulsions: Secondary | ICD-10-CM | POA: Diagnosis not present

## 2021-06-23 DIAGNOSIS — R403 Persistent vegetative state: Secondary | ICD-10-CM | POA: Diagnosis not present

## 2021-06-23 LAB — CBC WITH DIFFERENTIAL/PLATELET
Abs Immature Granulocytes: 0.14 10*3/uL — ABNORMAL HIGH (ref 0.00–0.07)
Abs Immature Granulocytes: 0.11 10*3/uL — ABNORMAL HIGH (ref 0.00–0.07)
Basophils Absolute: 0.1 10*3/uL (ref 0.0–0.1)
Basophils Absolute: 0.1 10*3/uL (ref 0.0–0.1)
Basophils Relative: 1 %
Basophils Relative: 1 %
Eosinophils Absolute: 0.2 10*3/uL (ref 0.0–0.5)
Eosinophils Absolute: 0.2 10*3/uL (ref 0.0–0.5)
Eosinophils Relative: 2 %
Eosinophils Relative: 2 %
HCT: 40.5 % (ref 39.0–52.0)
HCT: 41.2 % (ref 39.0–52.0)
Hemoglobin: 13.4 g/dL (ref 13.0–17.0)
Hemoglobin: 13.5 g/dL (ref 13.0–17.0)
Immature Granulocytes: 1 %
Immature Granulocytes: 1 %
Lymphocytes Relative: 28 %
Lymphocytes Relative: 26 %
Lymphs Abs: 3.4 10*3/uL (ref 0.7–4.0)
Lymphs Abs: 2.9 10*3/uL (ref 0.7–4.0)
MCH: 28.9 pg (ref 26.0–34.0)
MCH: 28.7 pg (ref 26.0–34.0)
MCHC: 33.1 g/dL (ref 30.0–36.0)
MCHC: 32.8 g/dL (ref 30.0–36.0)
MCV: 87.3 fL (ref 80.0–100.0)
MCV: 87.5 fL (ref 80.0–100.0)
Monocytes Absolute: 0.9 10*3/uL (ref 0.1–1.0)
Monocytes Absolute: 0.8 10*3/uL (ref 0.1–1.0)
Monocytes Relative: 7 %
Monocytes Relative: 7 %
Neutro Abs: 7.4 10*3/uL (ref 1.7–7.7)
Neutro Abs: 7.1 10*3/uL (ref 1.7–7.7)
Neutrophils Relative %: 61 %
Neutrophils Relative %: 63 %
Platelets: 536 10*3/uL — ABNORMAL HIGH (ref 150–400)
Platelets: 546 10*3/uL — ABNORMAL HIGH (ref 150–400)
RBC: 4.64 MIL/uL (ref 4.22–5.81)
RBC: 4.71 MIL/uL (ref 4.22–5.81)
RDW: 15.1 % (ref 11.5–15.5)
RDW: 15.2 % (ref 11.5–15.5)
WBC: 12.1 10*3/uL — ABNORMAL HIGH (ref 4.0–10.5)
WBC: 11 10*3/uL — ABNORMAL HIGH (ref 4.0–10.5)
nRBC: 0 % (ref 0.0–0.2)
nRBC: 0 % (ref 0.0–0.2)

## 2021-06-23 LAB — BASIC METABOLIC PANEL
Anion gap: 12 (ref 5–15)
BUN: 25 mg/dL — ABNORMAL HIGH (ref 6–20)
CO2: 26 mmol/L (ref 22–32)
Calcium: 10.3 mg/dL (ref 8.9–10.3)
Chloride: 96 mmol/L — ABNORMAL LOW (ref 98–111)
Creatinine, Ser: 0.61 mg/dL (ref 0.61–1.24)
GFR, Estimated: >60 mL/min (ref >60)
Glucose, Bld: 145 mg/dL — ABNORMAL HIGH (ref 70–99)
Potassium: 4.6 mmol/L (ref 3.5–5.1)
Sodium: 134 mmol/L — ABNORMAL LOW (ref 135–145)

## 2021-06-23 LAB — GLUCOSE, CAPILLARY
Glucose-Capillary: 126 mg/dL — ABNORMAL HIGH (ref 70–99)
Glucose-Capillary: 141 mg/dL — ABNORMAL HIGH (ref 70–99)
Glucose-Capillary: 145 mg/dL — ABNORMAL HIGH (ref 70–99)
Glucose-Capillary: 147 mg/dL — ABNORMAL HIGH (ref 70–99)
Glucose-Capillary: 155 mg/dL — ABNORMAL HIGH (ref 70–99)
Glucose-Capillary: 158 mg/dL — ABNORMAL HIGH (ref 70–99)

## 2021-06-23 MED ORDER — FREE WATER
200.0000 mL | Freq: Four times a day (QID) | Status: DC
  Administered 2021-06-23 – 2021-07-17 (×96): 200 mL

## 2021-06-23 MED ORDER — METOPROLOL TARTRATE 50 MG PO TABS
75.0000 mg | ORAL_TABLET | Freq: Two times a day (BID) | ORAL | Status: DC
  Administered 2021-06-23 – 2021-07-04 (×24): 75 mg
  Filled 2021-06-23 (×24): qty 1

## 2021-06-23 NOTE — Progress Notes (Signed)
CSW attempted to reach [place] in admissions at [facility] in [place] - no answer, a voicemail was left requesting a return call.  Edwin Dada, MSW, LCSW Transitions of Care  Clinical Social Worker II [phone]

## 2021-06-23 NOTE — Progress Notes (Signed)
[facility] PROGRESS NOTE  Vincent [name] DVV:[id number] DOB: 24-Apr-1971 DOA: 04/29/2021 PCP: Pcp, No  Status: Remains inpatient appropriate because:Altered mental status and Unsafe d/c plan  Dispo: The patient is from: Home              Anticipated d/c is to: SNF-trach capable              Patient currently is medically stable to d/c.  Difficult to place patient Yes               Barriers to discharge: Medicaid funding- permanent trach  Level of care: Progressive   Code Status: DNR Family Communication:  DVT prophylaxis: Subcutaneous heparin COVID vaccination status: Unknown  HPI:  50 y.o. 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    Objective: Vitals:   06/23/21 0500 06/23/21 0758  BP: 124/90 130/86  Pulse: (!) 115 (!) 112  Resp: 20 17  Temp: 97.7 F (36.5 C) 98.5 F (36.9 C)  SpO2: 99% 100%    Intake/Output Summary (Last 24 hours) at 06/23/2021 6269 Last data filed at 06/23/2021 0630 Gross per 24 hour  Intake --  Output 900 ml  Net -900 ml   Filed Weights   06/03/21 0500 06/04/21 0500 06/08/21 0353  Weight: 68.3 kg 68.4 kg 63.2 kg    Exam:  Constitutional: Unresponsive Respiratory: #6.0 cuffed Shiley trach to trach collar,  anterior lung sounds clear to auscultation without any increased work of breathing, no tracheal secretions noted Cardiovascular: Heart sounds S1-S2, regular but tachycardic pulse, no peripheral edema, skin warm and dry, normotensive Abdomen: no tenderness, soft, G-tube in place for feedings, LBM 9/23-Bowel sounds positive.  Neurologic: CN 2-12 appear to be grossly intact on visual inspection.  Bilateral pupils are 2 mm and barely react to light.  Does not flinch with painful  stimulus applied to arms or legs-no purposeful movement detected Psychiatric: Open, repetitive blinking, not tracking movement around the room.  Significant Events: 8/2 PEA Arrest, King airway in the field, CPR x6 minutes and epi x1, ROSC, Intubated in ED, Admitted to [facility]  8/24 PEG placed 8/4 Weaned off TTM, Coox O2 satof 47%. 8/8 Post pyloric Cortrak placed, myoclonic jerking, started Keppra, depakote 8/10 Completed antibiotic course for pneumonia. Family meeting transitioned to DNR. 8/15 Tracheostomy  8/18 Febrile overnight, started Zosyn for aspiration pneumonia 8/22 Completed Abx course for aspiration pneumonia  8/24 PEG placed 8/31 PSV wean 2 hours 9/1 Stopped lasix, farxiga, losartan, spiro with AKI. Ongoing jerking, diaphoresis. WBC increased, cultured / abx started empirically.  PCT increased. CK 2,450 9/2 start scheduled opiates for possible opiate withdrawal symptoms 9/4 add flexeril, on PSV.  Versed x1 overnight. 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  Sepsis due to ESBL Klebsiella in sputum  culture: Blood culture showed Klebsiella and MSSA, he did complete a course of antibiotics.   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 Tolerating Jevity tube feeding at 60 cc/h Avoid narcotics and  sedatives.  Acute kidney injury: Resolved.  Hypovolemic hyponatremia: Resolved.  Continue free water flushes per tube  Acute combined systolic and diastolic heart failure: Echocardiogram August 2022 demonstrated severely depressed LV function with an EF < 20%.  Has associated grade 2 diastolic dysfunction that is restrictive in nature and has associated pulmonary hypertension 40 mmHg Continue Lopressor Lasix as needed  Persistent tachycardia Patient has extremely low EF and this could be a compensatory mechanism but as precaution will increase beta-blocker dose today on 9/26   Opiate withdrawals: Resolved.  Nutrition: Status post PEG tube placement continue tube feedings and free water flushes.  Left upper lung nodule: Will need a follow-up CT in 3 months.  Hyperlipidemia: Continue Lipitor.   Goals of care: Persistent encephalopathy guarded prognosis palliative care was consulted sister was adamant about continuing full scope of treatment. The patient remains a DNR.      Data Reviewed: Basic Metabolic Panel: Recent Labs  Lab 06/17/21 0204 06/18/21 0301 06/19/21 0228 06/20/21 0059 06/21/21 0238  NA 134* 133* 133* 135 133*  K 4.0 4.4 4.6 4.1 4.4  CL 101 104 103 102 99  CO2 21* 18* 19* 24 25  GLUCOSE 111* 112* 97 111* 122*  BUN 23* 24* 26* 21* 20  CREATININE 0.73 0.64 0.59* 0.60* 0.60*  CALCIUM 9.6 9.1 9.3 9.5 9.8   Liver Function Tests: No results for input(s): AST, ALT, ALKPHOS, BILITOT, PROT, ALBUMIN in the last 168 hours. No results for input(s): LIPASE, AMYLASE in the last 168 hours. No results for input(s): AMMONIA in the last 168 hours. CBC: Recent Labs  Lab 06/19/21 0228 06/20/21 0059 06/21/21 0238 06/22/21 0159 06/23/21 0138  WBC 12.3* 10.1 9.8 11.6* 12.1*  NEUTROABS 7.2 6.1 5.6 6.8 7.4  HGB 12.0* 12.0* 12.0* 13.1 13.4  HCT 37.0* 36.3* 37.2* 39.5 40.5  MCV 90.2 87.5 87.5 87.0 87.3  PLT 421* 513* 504* 592* 536*    BNP (last 3  results) Recent Labs    04/29/21 2048 06/09/21 0350  BNP 626.7* 406.5*    CBG: Recent Labs  Lab 06/22/21 1624 06/22/21 2027 06/23/21 0026 06/23/21 0626 06/23/21 0756  GLUCAP 135* 147* 141* 147* 126*    No results found for this or any previous visit (from the past 240 hour(s)).    Scheduled Meds:  arformoterol  15 mcg Nebulization BID   atorvastatin  40 mg Per Tube QHS   budesonide (PULMICORT) nebulizer solution  0.5 mg Nebulization BID   chlorhexidine  15 mL Mouth Rinse BID   docusate  200 mg Per Tube BID   feeding supplement (PROSource TF)  45 mL Per Tube TID   free water  200 mL Per Tube Q6H   heparin  5,000 Units Subcutaneous Q8H   levETIRAcetam  1,000 mg Per Tube BID   mouth rinse  15 mL Mouth Rinse q12n4p   metoprolol tartrate  75 mg Per Tube BID   pantoprazole sodium  40 mg Per Tube QHS   polyethylene glycol  17 g Per Tube Daily   polyvinyl alcohol  2 drop Both Eyes Q6H   revefenacin  175 mcg Nebulization Daily   senna  1 tablet Per Tube Daily   Continuous Infusions:  sodium chloride 250 mL (06/15/21 1719)  feeding supplement (JEVITY 1.5 CAL/FIBER) 1,000 mL (06/22/21 1417)    Active Problems:   Cardiac arrest (HCC)   Acute respiratory failure with hypoxia (HCC)   Seizure-like activity (HCC)   Anoxic encephalopathy (HCC)   Tracheostomy in place [facility])   Goals of care, counseling/discussion   AKI (acute kidney injury) (HCC)   Tracheostomy dependence (HCC)   Persistent vegetative state [facility])   Consultants: Neurology General surgery Interventional radiology PCCM  Procedures: 2D echocardiogram Multiple EEGs Cortrak placement PEG tube placement  Antibiotics: Azithromycin 8/2 through 8/3 Cefepime 8/2 through 8/3 Ceftriaxone 8/3 through 8/7 Vancomycin 8/2 x 1 dose Zosyn 8/18 x 1 Unasyn 8/19 through 8/21 Cefazolin 8/24 through 8/24 Fluconazole 8/25 through 9/3 Zosyn 8/25 x 1 dose Cefepime 9/1 x 1 dose Vancomycin 9/1 x 1 dose Azithromycin  9/8 through 9/12 Zosyn 9/12 x 1 dose Meropenem 9/13 through/20   Time spent: 35 minutes    Junious Silk ANP  [facility] 7 am - 330 pm/M-F for direct patient care and secure chat Please refer to Amion for contact info 55  days

## 2021-06-24 DIAGNOSIS — R569 Unspecified convulsions: Secondary | ICD-10-CM | POA: Diagnosis not present

## 2021-06-24 DIAGNOSIS — G931 Anoxic brain damage, not elsewhere classified: Secondary | ICD-10-CM | POA: Diagnosis not present

## 2021-06-24 DIAGNOSIS — I469 Cardiac arrest, cause unspecified: Secondary | ICD-10-CM | POA: Diagnosis not present

## 2021-06-24 DIAGNOSIS — R403 Persistent vegetative state: Secondary | ICD-10-CM | POA: Diagnosis not present

## 2021-06-24 LAB — GLUCOSE, CAPILLARY
Glucose-Capillary: 104 mg/dL — ABNORMAL HIGH (ref 70–99)
Glucose-Capillary: 125 mg/dL — ABNORMAL HIGH (ref 70–99)
Glucose-Capillary: 138 mg/dL — ABNORMAL HIGH (ref 70–99)
Glucose-Capillary: 148 mg/dL — ABNORMAL HIGH (ref 70–99)
Glucose-Capillary: 156 mg/dL — ABNORMAL HIGH (ref 70–99)
Glucose-Capillary: 156 mg/dL — ABNORMAL HIGH (ref 70–99)

## 2021-06-24 MED ORDER — SENNA 8.6 MG PO TABS
1.0000 | ORAL_TABLET | Freq: Two times a day (BID) | ORAL | Status: DC
  Administered 2021-06-24 – 2021-07-22 (×47): 8.6 mg
  Filled 2021-06-24 (×49): qty 1

## 2021-06-24 MED ORDER — POLYETHYLENE GLYCOL 3350 17 G PO PACK
17.0000 g | PACK | Freq: Two times a day (BID) | ORAL | Status: DC
  Administered 2021-06-24 – 2021-07-17 (×30): 17 g
  Filled 2021-06-24 (×36): qty 1

## 2021-06-24 NOTE — Progress Notes (Signed)
CSW spoke with Vincent [name] at [employer] who states the patient's referral is currently under review by corporate at this time.  Edwin Dada, MSW, LCSW Transitions of Care  Clinical Social Worker II [phone]

## 2021-06-24 NOTE — Progress Notes (Signed)
[facility] PROGRESS NOTE  Vincent [name] HCW:[id number] DOB: [birth year] DOA: 04/29/2021 PCP: Pcp, No  Status: Remains inpatient appropriate because:Altered mental status and Unsafe d/c plan  Dispo: The patient is from: Home              Anticipated d/c is to: SNF-trach capable              Patient currently is medically stable to d/c.  Difficult to place patient Yes               Barriers to discharge: Medicaid funding- permanent trach  Level of care: Progressive   Code Status: DNR Family Communication:  DVT prophylaxis: Subcutaneous heparin COVID vaccination status: Unknown  HPI:  50 y.o. 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  He has had repetitive blinking concerning for seizure activity but EEG has been negative.  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  Since admission he has been treated for sepsis secondary to Blood culture showed Klebsiella and MSSA, he did complete a course of antibiotics.  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 Goals of care discussion held by palliative care team.  Patient's sister was adamant about continuing full scope of treatment although patient does remain a DNR   Subjective: Essentially unresponsive-will open eyes briefly to voice blink reflex  Objective: Vitals:   06/24/21 0418 06/24/21 0748  BP: 132/81 132/81  Pulse: (!) 106 (!) 110  Resp: 20 20  Temp: 98.5 F (36.9 C) 98.8 F (37.1 C)  SpO2: 100% 100%    Intake/Output Summary (Last 24 hours) at 06/24/2021 0806 Last data filed at 06/23/2021 1900 Gross per 24 hour  Intake --  Output 400 ml  Net -400 ml    Filed Weights   06/03/21 0500 06/04/21 0500 06/08/21 0353  Weight: 68.3 kg 68.4 kg 63.2 kg    Exam:  Constitutional: Unresponsive Respiratory: #6.0 cuffed Shiley trach to trach collar, sounds Cardiovascular: Heart sounds S1-S2, regular w/ sinus tachycardia no peripheral edema, skin warm and dry, normotensive Abdomen: no tenderness, soft, G-tube in place for feedings, LBM 9/23-Bowel sounds positive.  Neurologic: CN 2-12 appear to be grossly intact on visual inspection.  Bilateral pupils are 2 mm and barely react to light.  Does not flinch with painful stimulus applied to arms or legs-no purposeful movement detected Psychiatric: Unresponsive  Significant Events: 8/2 PEA Arrest, King airway in the field, CPR x6 minutes and epi x1, ROSC, Intubated in ED, Admitted to [facility]  8/24 PEG placed 8/4 Weaned off TTM, Coox O2 satof 47%. 8/8 Post pyloric Cortrak placed, myoclonic jerking, started Keppra, depakote 8/10 Completed antibiotic course for pneumonia. Family meeting transitioned to DNR. 8/15 Tracheostomy  8/18 Febrile overnight, started Zosyn for aspiration pneumonia 8/22 Completed Abx course for aspiration pneumonia  8/24 PEG placed 8/31 PSV wean 2 hours 9/1 Stopped lasix, farxiga, losartan, spiro with AKI. Ongoing jerking, diaphoresis. WBC increased, cultured / abx started empirically.  PCT increased. CK 2,450 9/2 start scheduled opiates for possible opiate withdrawal symptoms 9/4 add flexeril, on PSV.  Versed x1 overnight. 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 As of 9/27 patient has gone 4 days without a documented bowel movement.  Continue Colace BID and increase Senokot and MiraLAX to BID Avoid narcotics and sedatives.  Acute kidney injury: Resolved.  Hypovolemic hyponatremia: Resolved.    Acute combined systolic and diastolic heart failure with associated pulmonary hypertension: Continue Lopressor and Lasix as needed  Persistent tachycardia Patient has extremely low EF and this could be a compensatory mechanism but as precaution will increase beta-blocker dose today on 9/26   Opiate withdrawals: Resolved.  Nutrition: Status post PEG tube placement continue tube feedings and free water flushes.  Left upper lung nodule: Will need a follow-up CT in 3 months.  Hyperlipidemia: Continue Lipitor.   Goals of care: Persistent encephalopathy guarded prognosis palliative care was consulted sister was adamant about continuing full scope of treatment. The patient remains a DNR.      Data Reviewed: Basic Metabolic Panel: Recent Labs  Lab 06/18/21 0301 06/19/21 0228 06/20/21 0059 06/21/21 0238 06/23/21 0933  NA 133* 133* 135 133* 134*  K 4.4 4.6 4.1 4.4 4.6  CL 104 103 102 99 96*  CO2 18* 19* 24 25 26   GLUCOSE 112* 97 111* 122* 145*  BUN 24* 26* 21* 20 25*  CREATININE 0.64 0.59* 0.60* 0.60* 0.61  CALCIUM 9.1 9.3 9.5 9.8 10.3   Liver Function Tests: No results for input(s): AST, ALT, ALKPHOS, BILITOT, PROT, ALBUMIN in the last 168 hours. No results for input(s): LIPASE, AMYLASE in the last 168 hours. No results for input(s): AMMONIA in the last 168 hours. CBC: Recent Labs  Lab 06/20/21 0059 06/21/21 0238 06/22/21 0159  06/23/21 0138 06/23/21 0933  WBC 10.1 9.8 11.6* 12.1* 11.0*  NEUTROABS 6.1 5.6 6.8 7.4 7.1  HGB 12.0* 12.0* 13.1 13.4 13.5  HCT 36.3* 37.2* 39.5 40.5 41.2  MCV 87.5 87.5 87.0 87.3 87.5  PLT 513* 504* 592* 536* 546*    BNP (last 3 results) Recent Labs    04/29/21 2048 06/09/21 0350  BNP 626.7* 406.5*    CBG: Recent Labs  Lab 06/23/21 1640 06/23/21 2142 06/24/21 0006 06/24/21 0416 06/24/21 0757  GLUCAP 145* 155* 125* 156* 156*    No results found for this or any previous visit (from the past 240 hour(s)).    Scheduled Meds:  arformoterol  15 mcg Nebulization BID   atorvastatin  40 mg Per Tube QHS   budesonide (PULMICORT) nebulizer solution  0.5 mg Nebulization BID   chlorhexidine  15 mL Mouth Rinse BID   docusate  200 mg Per Tube BID   feeding supplement (PROSource TF)  45 mL Per Tube TID   free water  200 mL Per Tube Q6H   heparin  5,000 Units Subcutaneous Q8H   levETIRAcetam  1,000 mg Per Tube BID   mouth rinse  15 mL Mouth Rinse q12n4p   metoprolol tartrate  75 mg Per Tube BID   pantoprazole sodium  40 mg Per Tube QHS   polyethylene glycol  17 g Per Tube Daily   polyvinyl alcohol  2 drop Both Eyes Q6H   revefenacin  175 mcg Nebulization Daily   senna  1 tablet Per Tube Daily   Continuous Infusions:  sodium chloride 250 mL (06/15/21 1719)   feeding supplement (JEVITY 1.5 CAL/FIBER) 1,000 mL (06/24/21 0553)    Active Problems:   Cardiac arrest (HCC)   Acute respiratory failure with hypoxia (HCC)   Seizure-like activity (HCC)   Anoxic encephalopathy (HCC)   Tracheostomy in place [facility])   Goals of care, counseling/discussion   AKI (acute kidney injury) (HCC)   Tracheostomy dependence (HCC)   Persistent vegetative state [facility])   Consultants: Neurology General surgery Interventional radiology PCCM  Procedures: 2D echocardiogram Multiple EEGs Cortrak placement PEG tube placement  Antibiotics: Azithromycin 8/2 through 8/3 Cefepime 8/2 through  8/3 Ceftriaxone 8/3 through 8/7 Vancomycin 8/2 x 1 dose Zosyn 8/18 x 1 Unasyn 8/19 through 8/21 Cefazolin 8/24 through 8/24 Fluconazole 8/25 through 9/3 Zosyn 8/25 x 1 dose Cefepime 9/1 x 1 dose Vancomycin 9/1 x 1 dose Azithromycin 9/8 through 9/12 Zosyn 9/12 x 1 dose Meropenem 9/13 through/20   Time spent: 15 minutes    Junious Silk ANP  [facility] 7 am - 330 pm/M-F for direct patient care and secure chat Please refer to Amion for contact info 56  days

## 2021-06-24 NOTE — Procedures (Signed)
Tracheostomy Change Note  Patient Details:   Name: Vincent [name] DOB: 10-20-1970 MRN: [id number]    Airway Documentation:  Trach tube changed to a #6 Shiley cuffless with positive color change on ETCO2 detector. No apparent complications. Sats 100%. Will continue to monitor.   Evaluation  O2 sats: stable throughout Complications: No apparent complications Patient did tolerate procedure well. Bilateral Breath Sounds: Diminished    Suszanne Conners 06/24/2021, 11:22 AM

## 2021-06-25 DIAGNOSIS — G931 Anoxic brain damage, not elsewhere classified: Secondary | ICD-10-CM | POA: Diagnosis not present

## 2021-06-25 DIAGNOSIS — Z930 Tracheostomy status: Secondary | ICD-10-CM | POA: Diagnosis not present

## 2021-06-25 DIAGNOSIS — G8929 Other chronic pain: Secondary | ICD-10-CM

## 2021-06-25 DIAGNOSIS — F411 Generalized anxiety disorder: Secondary | ICD-10-CM

## 2021-06-25 DIAGNOSIS — I469 Cardiac arrest, cause unspecified: Secondary | ICD-10-CM | POA: Diagnosis not present

## 2021-06-25 DIAGNOSIS — R403 Persistent vegetative state: Secondary | ICD-10-CM | POA: Diagnosis not present

## 2021-06-25 LAB — GLUCOSE, CAPILLARY
Glucose-Capillary: 137 mg/dL — ABNORMAL HIGH (ref 70–99)
Glucose-Capillary: 138 mg/dL — ABNORMAL HIGH (ref 70–99)
Glucose-Capillary: 177 mg/dL — ABNORMAL HIGH (ref 70–99)
Glucose-Capillary: 130 mg/dL — ABNORMAL HIGH (ref 70–99)
Glucose-Capillary: 132 mg/dL — ABNORMAL HIGH (ref 70–99)
Glucose-Capillary: 153 mg/dL — ABNORMAL HIGH (ref 70–99)

## 2021-06-25 MED ORDER — IBUPROFEN 100 MG/5ML PO SUSP
400.0000 mg | Freq: Three times a day (TID) | ORAL | Status: DC
  Administered 2021-06-25 – 2021-07-02 (×21): 400 mg via ORAL
  Filled 2021-06-25 (×23): qty 20

## 2021-06-25 MED ORDER — OXYCODONE HCL 5 MG/5ML PO SOLN
2.5000 mg | Freq: Four times a day (QID) | ORAL | Status: DC
  Administered 2021-06-25 – 2021-06-26 (×5): 2.5 mg
  Filled 2021-06-25 (×5): qty 5

## 2021-06-25 MED ORDER — MAGNESIUM HYDROXIDE 400 MG/5ML PO SUSP
30.0000 mL | Freq: Once | ORAL | Status: AC
  Administered 2021-06-25: 30 mL
  Filled 2021-06-25: qty 30

## 2021-06-25 MED ORDER — CLONAZEPAM 0.5 MG PO TABS
0.5000 mg | ORAL_TABLET | Freq: Three times a day (TID) | ORAL | Status: DC
  Administered 2021-06-25 – 2021-07-01 (×19): 0.5 mg
  Filled 2021-06-25 (×19): qty 1

## 2021-06-25 NOTE — Progress Notes (Signed)
[facility] PROGRESS NOTE  Nishaan Athen Riel QVZ:[id number] DOB: 12-Sep-1971 DOA: 04/29/2021 PCP: Pcp, No  Status: Remains inpatient appropriate because:Altered mental status and Unsafe d/c plan  Dispo: The patient is from: Home              Anticipated d/c is to: SNF-trach capable              Patient currently is medically stable to d/c.  Difficult to place patient Yes               Barriers to discharge: Medicaid funding- permanent trach  Level of care: Progressive   Code Status: DNR Family Communication:  DVT prophylaxis: Subcutaneous heparin COVID vaccination status: Unknown  HPI:  50 y.o. 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  He has had repetitive blinking concerning for seizure activity but EEG has been negative.  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  Since admission he has been treated for sepsis secondary to Blood culture showed Klebsiella and MSSA, he did complete a course of antibiotics.  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 Goals of care discussion held by palliative care team.  Patient's sister was adamant about continuing full scope of treatment although patient does remain a DNR   Subjective: Staff in room working with patient and he is much more alert than on previous interactions.  He does follow me somewhat around the room and open his mouth when I was talking to him as if he were trying to answer a question.  Discussed with staff noting that he has been having tachycardia sweating and intermittent hypertension and this is likely a reflection of both pain and anxiety.  Objective: Vitals:    06/25/21 0340 06/25/21 0744  BP: (!) 144/86 (!) 160/95  Pulse: (!) 121 (!) 120  Resp: (!) 27 (!) 21  Temp: 97.9 F (36.6 C) 98.5 F (36.9 C)  SpO2: 98% 97%    Intake/Output Summary (Last 24 hours) at 06/25/2021 3295 Last data filed at 06/24/2021 1558 Gross per 24 hour  Intake --  Output 400 ml  Net -400 ml   Filed Weights   06/03/21 0500 06/04/21 0500 06/08/21 0353  Weight: 68.3 kg 68.4 kg 63.2 kg    Exam:  Constitutional: No acute distress, more awake than usual with notable increased stimulation in regards to current nursing care being provided Respiratory: #6.0 cuffed Shiley trach to trach collar, sounds Cardiovascular: Heart sounds S1-S2, regular w/ sinus tachycardia no peripheral edema, skin warm and dry, intermittent hypertension Abdomen: no tenderness, soft, G-tube in place for feedings, LBM 9/23-Bowel sounds positive.  Neurologic: CN 2-12 appear to be grossly intact on visual inspection.  Eyes are open and he appears to be tracking.  When I was asking him a question he appeared to open his mouth as if he were trying to respond.  He was also spontaneously moving his right arm Psychiatric: Awake, nonverbal so unable to accurately assess orientation  Significant Events: 8/2 PEA Arrest, King airway in the field, CPR x6 minutes and epi x1, ROSC, Intubated in ED, Admitted to [facility]  8/24 PEG placed 8/4 Weaned off TTM, Coox O2 satof 47%. 8/8 Post  pyloric Cortrak placed, myoclonic jerking, started Keppra, depakote 8/10 Completed antibiotic course for pneumonia. Family meeting transitioned to DNR. 8/15 Tracheostomy  8/18 Febrile overnight, started Zosyn for aspiration pneumonia 8/22 Completed Abx course for aspiration pneumonia  8/24 PEG placed 8/31 PSV wean 2 hours 9/1 Stopped lasix, farxiga, losartan, spiro with AKI. Ongoing jerking, diaphoresis. WBC increased, cultured / abx started empirically.  PCT increased. CK 2,450 9/2 start scheduled opiates for possible opiate  withdrawal symptoms 9/4 add flexeril, on PSV.  Versed x1 overnight. 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  ?  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 No apparent bowel movement as of 9/20 8 AM  Continue Colace BID, Senokot and MiraLAX to BID Monitor frequency of bowel movements with introduction of low-dose Oxy IR Will give one-time dose of milk of magnesia today  Acute kidney injury: Resolved.  Hypovolemic hyponatremia: Resolved.    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  Nutrition: Status post PEG tube placement continue tube feedings and free water flushes.  Left upper lung nodule: Will need a follow-up CT in 3 months.  Hyperlipidemia: Continue Lipitor.   Goals of care: Persistent encephalopathy guarded prognosis palliative care was consulted sister was  adamant about continuing full scope of treatment. The patient remains a DNR.      Data Reviewed: Basic Metabolic Panel: Recent Labs  Lab 06/19/21 0228 06/20/21 0059 06/21/21 0238 06/23/21 0933  NA 133* 135 133* 134*  K 4.6 4.1 4.4 4.6  CL 103 102 99 96*  CO2 19* 24 25 26   GLUCOSE 97 111* 122* 145*  BUN 26* 21* 20 25*  CREATININE 0.59* 0.60* 0.60* 0.61  CALCIUM 9.3 9.5 9.8 10.3   Liver Function Tests: No results for input(s): AST, ALT, ALKPHOS, BILITOT, PROT, ALBUMIN in the last 168 hours. No results for input(s): LIPASE, AMYLASE in the last 168 hours. No results for input(s): AMMONIA in the last 168 hours. CBC: Recent Labs  Lab 06/20/21 0059 06/21/21 0238 06/22/21 0159 06/23/21 0138 06/23/21 0933  WBC 10.1 9.8 11.6* 12.1* 11.0*  NEUTROABS 6.1 5.6 6.8 7.4 7.1  HGB 12.0* 12.0* 13.1 13.4 13.5  HCT 36.3* 37.2* 39.5 40.5 41.2  MCV 87.5 87.5 87.0 87.3 87.5  PLT 513* 504* 592* 536* 546*    BNP (last 3 results) Recent Labs    04/29/21 2048 06/09/21 0350  BNP 626.7* 406.5*    CBG: Recent Labs  Lab 06/24/21 0416 06/24/21 0757 06/24/21 1143 06/24/21 1553 06/24/21 1940  GLUCAP 156* 156* 138* 148* 104*  No results found for this or any previous visit (from the past 240 hour(s)).    Scheduled Meds:  arformoterol  15 mcg Nebulization BID   atorvastatin  40 mg Per Tube QHS   budesonide (PULMICORT) nebulizer solution  0.5 mg Nebulization BID   chlorhexidine  15 mL Mouth Rinse BID   clonazePAM  0.5 mg Per Tube Q8H   docusate  200 mg Per Tube BID   feeding supplement (PROSource TF)  45 mL Per Tube TID   free water  200 mL Per Tube Q6H   heparin  5,000 Units Subcutaneous Q8H   levETIRAcetam  1,000 mg Per Tube BID   mouth rinse  15 mL Mouth Rinse q12n4p   metoprolol tartrate  75 mg Per Tube BID   pantoprazole sodium  40 mg Per Tube QHS   polyethylene glycol  17 g Per Tube BID   polyvinyl alcohol  2 drop Both Eyes Q6H   revefenacin  175 mcg Nebulization  Daily   senna  1 tablet Per Tube BID   Continuous Infusions:  sodium chloride 250 mL (06/15/21 1719)   feeding supplement (JEVITY 1.5 CAL/FIBER) 1,000 mL (06/25/21 0325)    Active Problems:   Cardiac arrest (HCC)   Acute respiratory failure with hypoxia (HCC)   Seizure-like activity (HCC)   Anoxic encephalopathy (HCC)   Tracheostomy in place [facility])   Goals of care, counseling/discussion   AKI (acute kidney injury) (HCC)   Tracheostomy dependence (HCC)   Persistent vegetative state [facility])   Consultants: Neurology General surgery Interventional radiology PCCM  Procedures: 2D echocardiogram Multiple EEGs Cortrak placement PEG tube placement  Antibiotics: Azithromycin 8/2 through 8/3 Cefepime 8/2 through 8/3 Ceftriaxone 8/3 through 8/7 Vancomycin 8/2 x 1 dose Zosyn 8/18 x 1 Unasyn 8/19 through 8/21 Cefazolin 8/24 through 8/24 Fluconazole 8/25 through 9/3 Zosyn 8/25 x 1 dose Cefepime 9/1 x 1 dose Vancomycin 9/1 x 1 dose Azithromycin 9/8 through 9/12 Zosyn 9/12 x 1 dose Meropenem 9/13 through/20   Time spent: 15 minutes    Junious Silk ANP  [facility] 7 am - 330 pm/M-F for direct patient care and secure chat Please refer to Amion for contact info 57  days

## 2021-06-25 NOTE — Progress Notes (Signed)
Nutrition Follow-up  DOCUMENTATION CODES:   Not applicable  INTERVENTION:   Continue TF via PEG: Jevity 1.5 at 60 ml/h (1440 ml/d). Prosource TF 45 ml TID. Free water flushes 200 ml every 6 hours.  Provides 2280 kcal, 125 gm protein, 1094 ml free water daily (1894 ml total with flushes)  Recommend check weights at least weekly.    NUTRITION DIAGNOSIS:   Inadequate oral intake related to inability to eat as evidenced by NPO status.  Ongoing   GOAL:   Patient will meet greater than or equal to 90% of their needs  Met with TF  MONITOR:   Labs, Weight trends, TF tolerance, Skin, I & O's  REASON FOR ASSESSMENT:   Ventilator, Consult Enteral/tube feeding initiation and management  ASSESSMENT:   50 yo male admitted S/P PEA cardiac arrest at home. PMH includes tobacco abuse, emphysema, CVA, diabetes.  Current TF order: Jevity 1.5 at 60 ml/h with Prosource TF 45 ml TID. Patient is tolerating TF well at goal rate to meet 100% of estimated needs. He remains in a persistent vegetative state.    Labs reviewed.  CBG: [id number]  Medications reviewed and include Colace, Protonix, Senokot, Keppra.  Last weight 63.2 kg on 9/11 Admission weight 70 kg on 8/2  Diet Order:   Diet Order     None       EDUCATION NEEDS:   Not appropriate for education at this time  Skin:  Skin Assessment: Reviewed RN Assessment  Last BM:  9/27 type 7  Height:   Ht Readings from Last 1 Encounters:  05/26/21 5' 8"  (1.727 m)    Weight:   Wt Readings from Last 1 Encounters:  06/08/21 63.2 kg    Ideal Body Weight:  70 kg  BMI:  Body mass index is 21.2 kg/m.  Estimated Nutritional Needs:   Kcal:  2000-2200  Protein:  110-125 gm  Fluid:  >/= 2 L    Lucas Mallow, RD, LDN, CNSC Please refer to Amion for contact information.

## 2021-06-26 ENCOUNTER — Inpatient Hospital Stay (HOSPITAL_COMMUNITY): Payer: Medicaid Other

## 2021-06-26 DIAGNOSIS — Z930 Tracheostomy status: Secondary | ICD-10-CM | POA: Diagnosis not present

## 2021-06-26 DIAGNOSIS — G931 Anoxic brain damage, not elsewhere classified: Secondary | ICD-10-CM | POA: Diagnosis not present

## 2021-06-26 DIAGNOSIS — G8929 Other chronic pain: Secondary | ICD-10-CM | POA: Diagnosis not present

## 2021-06-26 DIAGNOSIS — I469 Cardiac arrest, cause unspecified: Secondary | ICD-10-CM | POA: Diagnosis not present

## 2021-06-26 DIAGNOSIS — K5981 Ogilvie syndrome: Secondary | ICD-10-CM

## 2021-06-26 LAB — CBC WITH DIFFERENTIAL/PLATELET
Abs Immature Granulocytes: 0.09 10*3/uL — ABNORMAL HIGH (ref 0.00–0.07)
Basophils Absolute: 0.1 10*3/uL (ref 0.0–0.1)
Basophils Relative: 1 %
Eosinophils Absolute: 0.2 10*3/uL (ref 0.0–0.5)
Eosinophils Relative: 2 %
HCT: 40.7 % (ref 39.0–52.0)
Hemoglobin: 13.4 g/dL (ref 13.0–17.0)
Immature Granulocytes: 1 %
Lymphocytes Relative: 34 %
Lymphs Abs: 3.7 10*3/uL (ref 0.7–4.0)
MCH: 28.5 pg (ref 26.0–34.0)
MCHC: 32.9 g/dL (ref 30.0–36.0)
MCV: 86.6 fL (ref 80.0–100.0)
Monocytes Absolute: 1 10*3/uL (ref 0.1–1.0)
Monocytes Relative: 9 %
Neutro Abs: 6 10*3/uL (ref 1.7–7.7)
Neutrophils Relative %: 53 %
Platelets: 514 10*3/uL — ABNORMAL HIGH (ref 150–400)
RBC: 4.7 MIL/uL (ref 4.22–5.81)
RDW: 15.1 % (ref 11.5–15.5)
WBC: 11 10*3/uL — ABNORMAL HIGH (ref 4.0–10.5)
nRBC: 0 % (ref 0.0–0.2)

## 2021-06-26 LAB — URINALYSIS, ROUTINE W REFLEX MICROSCOPIC
Bilirubin Urine: NEGATIVE
Glucose, UA: NEGATIVE mg/dL
Hgb urine dipstick: NEGATIVE
Ketones, ur: NEGATIVE mg/dL
Nitrite: NEGATIVE
Protein, ur: NEGATIVE mg/dL
Specific Gravity, Urine: 1.025 (ref 1.005–1.030)
WBC, UA: >50 WBC/hpf — ABNORMAL HIGH (ref 0–5)
pH: 5 (ref 5.0–8.0)

## 2021-06-26 LAB — HEPATIC FUNCTION PANEL
ALT: 108 U/L — ABNORMAL HIGH (ref 0–44)
AST: 32 U/L (ref 15–41)
Albumin: 3.7 g/dL (ref 3.5–5.0)
Alkaline Phosphatase: 71 U/L (ref 38–126)
Bilirubin, Direct: <0.1 mg/dL (ref 0.0–0.2)
Total Bilirubin: 0.6 mg/dL (ref 0.3–1.2)
Total Protein: 7.6 g/dL (ref 6.5–8.1)

## 2021-06-26 LAB — BASIC METABOLIC PANEL
Anion gap: 12 (ref 5–15)
BUN: 29 mg/dL — ABNORMAL HIGH (ref 6–20)
CO2: 25 mmol/L (ref 22–32)
Calcium: 10.1 mg/dL (ref 8.9–10.3)
Chloride: 97 mmol/L — ABNORMAL LOW (ref 98–111)
Creatinine, Ser: 0.82 mg/dL (ref 0.61–1.24)
GFR, Estimated: >60 mL/min (ref >60)
Glucose, Bld: 134 mg/dL — ABNORMAL HIGH (ref 70–99)
Potassium: 4.5 mmol/L (ref 3.5–5.1)
Sodium: 134 mmol/L — ABNORMAL LOW (ref 135–145)

## 2021-06-26 LAB — GLUCOSE, CAPILLARY
Glucose-Capillary: 111 mg/dL — ABNORMAL HIGH (ref 70–99)
Glucose-Capillary: 111 mg/dL — ABNORMAL HIGH (ref 70–99)
Glucose-Capillary: 138 mg/dL — ABNORMAL HIGH (ref 70–99)
Glucose-Capillary: 146 mg/dL — ABNORMAL HIGH (ref 70–99)
Glucose-Capillary: 164 mg/dL — ABNORMAL HIGH (ref 70–99)
Glucose-Capillary: 170 mg/dL — ABNORMAL HIGH (ref 70–99)

## 2021-06-26 LAB — SEDIMENTATION RATE: Sed Rate: 18 mm/h — ABNORMAL HIGH (ref 0–16)

## 2021-06-26 LAB — C-REACTIVE PROTEIN: CRP: 0.9 mg/dL

## 2021-06-26 LAB — LACTIC ACID, PLASMA
Lactic Acid, Venous: 1.4 mmol/L (ref 0.5–1.9)
Lactic Acid, Venous: 1.5 mmol/L (ref 0.5–1.9)

## 2021-06-26 MED ORDER — OXYCODONE HCL 5 MG/5ML PO SOLN
2.5000 mg | Freq: Four times a day (QID) | ORAL | Status: DC | PRN
  Administered 2021-07-02 – 2021-07-12 (×5): 2.5 mg
  Filled 2021-06-26 (×5): qty 5

## 2021-06-26 MED ORDER — NUTRISOURCE FIBER PO PACK
1.0000 | PACK | Freq: Two times a day (BID) | ORAL | Status: DC
  Administered 2021-06-26 – 2021-07-22 (×52): 1
  Filled 2021-06-26 (×56): qty 1

## 2021-06-26 NOTE — Progress Notes (Signed)
Pt noted to be diaphoretic and in discomfort.  Temp 96.6 axially. Dr. Jomarie Longs was at the bedside.  She will order labs. Received order to recheck his temp in one hour.  Hinton Dyer, RN

## 2021-06-26 NOTE — Progress Notes (Signed)
[facility] PROGRESS NOTE  Vincent [name] PPJ:[id number] DOB: Feb 20, 1971 DOA: 04/29/2021 PCP: Pcp, No  Status: Remains inpatient appropriate because:Altered mental status and Unsafe d/c plan  Dispo: The patient is from: Home              Anticipated d/c is to: SNF-trach capable              Patient currently is medically stable to d/c.  Difficult to place patient Yes               Barriers to discharge: Medicaid funding- permanent trach  Level of care: Progressive   Code Status: DNR Family Communication:  DVT prophylaxis: Subcutaneous heparin COVID vaccination status: Unknown  HPI:  50 y.o. 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  He has had repetitive blinking concerning for seizure activity but EEG has been negative.  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  Since admission he has been treated for sepsis secondary to Blood culture showed Klebsiella and MSSA, he did complete a course of antibiotics.  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 Goals of care discussion held by palliative care team.  Patient's sister was adamant about continuing full scope of treatment although patient does remain a DNR   Subjective: Not opening eyes or responding today even with brisk stimulation-before I examined the patient nursing staff noted that he was more tachycardic tachypneic and diaphoretic than baseline and less alert.  Axillary temp was 96.6.  Attending physician notified and orders were placed  Objective: Vitals:   06/26/21 0430 06/26/21 0754  BP: 118/83 (!) 125/94  Pulse: (!) 105 (!) 116  Resp: (!) 24 20  Temp:  98.8 F (37.1 C) 98.6 F (37 C)  SpO2: 98% 99%    Intake/Output Summary (Last 24 hours) at 06/26/2021 5809 Last data filed at 06/26/2021 0756 Gross per 24 hour  Intake --  Output 700 ml  Net -700 ml   Filed Weights   06/03/21 0500 06/04/21 0500 06/08/21 0353  Weight: 68.3 kg 68.4 kg 63.2 kg    Exam:  Constitutional: No acute distress, more awake than usual with notable increased stimulation in regards to current nursing care being provided Respiratory: #6.0 cuffed Shiley trach to trach collar, sounds are clear anteriorly, normal pulse oximetry reading Cardiovascular: Heart sounds S1-S2, regular w/ sinus tachycardia no peripheral edema, skin warm and dry, currently normotensive Abdomen: no tenderness, soft and distended in context of known Ogilvie syndrome, G-tube in place for feedings, LBM 9/28-Bowel sounds positive.  Neurologic: CN 2-12 appear to be grossly intact on visual inspection.  Unable to get patient to respond or open eyes even with brisk or painful stimulation Psychiatric: Currently unresponsive and more lethargic than yesterday  Significant Events: 8/2 PEA Arrest, King airway in the field, CPR x6 minutes and epi x1, ROSC, Intubated in ED, Admitted to [facility]  8/24 PEG placed 8/4 Weaned off TTM, Coox O2 satof 47%. 8/8 Post pyloric Cortrak placed, myoclonic jerking, started Keppra, depakote 8/10 Completed antibiotic course for pneumonia. Family meeting transitioned to DNR. 8/15 Tracheostomy  8/18 Febrile overnight, started Zosyn for aspiration pneumonia 8/22 Completed Abx course for aspiration pneumonia  8/24  PEG placed 8/31 PSV wean 2 hours 9/1 Stopped lasix, farxiga, losartan, spiro with AKI. Ongoing jerking, diaphoresis. WBC increased, cultured / abx started empirically.  PCT increased. CK 2,450 9/2 start scheduled opiates for possible opiate withdrawal symptoms 9/4 add flexeril, on PSV.  Versed x1 overnight. 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  Hypothermia/altered mental status Attending physician concerned that this may be part of sepsis physiology Work-up negative thus far: WBC slightly elevated at 11,000, renal function normal, lactic acid and CRP normal, and rate only slightly elevated at 18.  Blood cultures are pending.  UA and urine culture via cath specimen need to be collected Temp of 96.6 was an axillary reading.  I asked the nurse to check a rectal temperature and this was normal at 99.7 Chest x-ray and abdominal x-ray within normal limits for patient Bladder scan ordered but has not yet been completed Potentially altered mental status secondary to low-dose scheduled Oxy IR so was changed back to as needed  ?  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 No apparent bowel movement as of 9/28 AM  Continue Colace BID, Senokot and MiraLAX to BID Monitor  frequency of bowel movements with introduction of low-dose Oxy IR Given MOM on 9/28 without any result Patient has fiber in Jevity but given underlying Ogilvie syndrome we will also add Neutra source fiber twice daily to help bulk up stools further  Acute kidney injury with rhabdomyolysis: Resolved.  Hypovolemic hyponatremia: Resolved.    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  Nutrition: Status post PEG tube placement continue tube feedings and free water flushes.  Left upper lung nodule: Will need a follow-up CT in 3 months.  Hyperlipidemia: Continue Lipitor.   Goals of care: Persistent encephalopathy guarded prognosis palliative care was consulted sister was adamant about continuing full scope of treatment. The patient remains a DNR.      Data Reviewed: Basic Metabolic Panel: Recent Labs  Lab 06/20/21 0059 06/21/21 0238 06/23/21 0933  NA 135 133* 134*  K 4.1 4.4 4.6  CL 102 99 96*  CO2 24 25 26   GLUCOSE 111* 122* 145*  BUN 21* 20 25*  CREATININE 0.60* 0.60* 0.61  CALCIUM 9.5 9.8 10.3   Liver Function Tests: No results for input(s): AST, ALT, ALKPHOS, BILITOT, PROT, ALBUMIN in the last 168 hours. No results for input(s): LIPASE, AMYLASE in the last 168 hours. No results for input(s): AMMONIA in the last 168 hours.  CBC: Recent Labs  Lab 06/20/21 0059 06/21/21 0238 06/22/21 0159 06/23/21 0138 06/23/21 0933  WBC 10.1 9.8 11.6* 12.1* 11.0*  NEUTROABS 6.1 5.6 6.8 7.4 7.1  HGB 12.0* 12.0* 13.1 13.4 13.5  HCT 36.3* 37.2* 39.5 40.5 41.2  MCV 87.5 87.5 87.0 87.3 87.5  PLT 513* 504* 592* 536* 546*    BNP (last 3 results) Recent Labs    04/29/21 2048 06/09/21 0350  BNP 626.7* 406.5*    CBG: Recent Labs  Lab 06/25/21 1633 06/25/21 2109 06/26/21 0027 06/26/21 0427 06/26/21 0752  GLUCAP 132* 153* 138* 170* 146*    No  results found for this or any previous visit (from the past 240 hour(s)).    Scheduled Meds:  arformoterol  15 mcg Nebulization BID   atorvastatin  40 mg Per Tube QHS   budesonide (PULMICORT) nebulizer solution  0.5 mg Nebulization BID   chlorhexidine  15 mL Mouth Rinse BID   clonazePAM  0.5 mg Per Tube Q8H   docusate  200 mg Per Tube BID   feeding supplement (PROSource TF)  45 mL Per Tube TID   free water  200 mL Per Tube Q6H   heparin  5,000 Units Subcutaneous Q8H   ibuprofen  400 mg Oral Q8H   levETIRAcetam  1,000 mg Per Tube BID   mouth rinse  15 mL Mouth Rinse q12n4p   metoprolol tartrate  75 mg Per Tube BID   oxyCODONE  2.5 mg Per Tube Q6H   pantoprazole sodium  40 mg Per Tube QHS   polyethylene glycol  17 g Per Tube BID   polyvinyl alcohol  2 drop Both Eyes Q6H   revefenacin  175 mcg Nebulization Daily   senna  1 tablet Per Tube BID   Continuous Infusions:  sodium chloride 250 mL (06/15/21 1719)   feeding supplement (JEVITY 1.5 CAL/FIBER) 1,000 mL (06/26/21 0029)    Active Problems:   Cardiac arrest (HCC)   Acute respiratory failure with hypoxia (HCC)   Seizure-like activity (HCC)   Anoxic encephalopathy (HCC)   Tracheostomy in place [facility])   Goals of care, counseling/discussion   AKI (acute kidney injury) (HCC)   Tracheostomy dependence (HCC)   Persistent vegetative state (HCC)   Generalized anxiety disorder   Other chronic pain   Consultants: Neurology General surgery Interventional radiology PCCM  Procedures: 2D echocardiogram Multiple EEGs Cortrak placement PEG tube placement  Antibiotics: Azithromycin 8/2 through 8/3 Cefepime 8/2 through 8/3 Ceftriaxone 8/3 through 8/7 Vancomycin 8/2 x 1 dose Zosyn 8/18 x 1 Unasyn 8/19 through 8/21 Cefazolin 8/24 through 8/24 Fluconazole 8/25 through 9/3 Zosyn 8/25 x 1 dose Cefepime 9/1 x 1 dose Vancomycin 9/1 x 1 dose Azithromycin 9/8 through 9/12 Zosyn 9/12 x 1 dose Meropenem 9/13 through/20   Time  spent: 25 minutes    Junious Silk ANP  [facility] 7 am - 330 pm/M-F for direct patient care and secure chat Please refer to Amion for contact info 58  days

## 2021-06-27 DIAGNOSIS — G8929 Other chronic pain: Secondary | ICD-10-CM | POA: Diagnosis not present

## 2021-06-27 DIAGNOSIS — K5981 Ogilvie syndrome: Secondary | ICD-10-CM | POA: Diagnosis not present

## 2021-06-27 DIAGNOSIS — A499 Bacterial infection, unspecified: Secondary | ICD-10-CM

## 2021-06-27 DIAGNOSIS — I469 Cardiac arrest, cause unspecified: Secondary | ICD-10-CM | POA: Diagnosis not present

## 2021-06-27 DIAGNOSIS — N390 Urinary tract infection, site not specified: Secondary | ICD-10-CM

## 2021-06-27 DIAGNOSIS — G931 Anoxic brain damage, not elsewhere classified: Secondary | ICD-10-CM | POA: Diagnosis not present

## 2021-06-27 LAB — GLUCOSE, CAPILLARY
Glucose-Capillary: 116 mg/dL — ABNORMAL HIGH (ref 70–99)
Glucose-Capillary: 118 mg/dL — ABNORMAL HIGH (ref 70–99)
Glucose-Capillary: 120 mg/dL — ABNORMAL HIGH (ref 70–99)
Glucose-Capillary: 122 mg/dL — ABNORMAL HIGH (ref 70–99)
Glucose-Capillary: 123 mg/dL — ABNORMAL HIGH (ref 70–99)

## 2021-06-27 LAB — BASIC METABOLIC PANEL
Anion gap: 10 (ref 5–15)
BUN: 25 mg/dL — ABNORMAL HIGH (ref 6–20)
CO2: 27 mmol/L (ref 22–32)
Calcium: 9.7 mg/dL (ref 8.9–10.3)
Chloride: 97 mmol/L — ABNORMAL LOW (ref 98–111)
Creatinine, Ser: 0.7 mg/dL (ref 0.61–1.24)
GFR, Estimated: >60 mL/min (ref >60)
Glucose, Bld: 111 mg/dL — ABNORMAL HIGH (ref 70–99)
Potassium: 4.4 mmol/L (ref 3.5–5.1)
Sodium: 134 mmol/L — ABNORMAL LOW (ref 135–145)

## 2021-06-27 LAB — CBC
HCT: 38.2 % — ABNORMAL LOW (ref 39.0–52.0)
Hemoglobin: 12.5 g/dL — ABNORMAL LOW (ref 13.0–17.0)
MCH: 28.7 pg (ref 26.0–34.0)
MCHC: 32.7 g/dL (ref 30.0–36.0)
MCV: 87.8 fL (ref 80.0–100.0)
Platelets: 447 10*3/uL — ABNORMAL HIGH (ref 150–400)
RBC: 4.35 MIL/uL (ref 4.22–5.81)
RDW: 15.2 % (ref 11.5–15.5)
WBC: 9.9 10*3/uL (ref 4.0–10.5)
nRBC: 0 % (ref 0.0–0.2)

## 2021-06-27 LAB — MAGNESIUM: Magnesium: 2.1 mg/dL (ref 1.7–2.4)

## 2021-06-27 MED ORDER — SODIUM CHLORIDE 0.9 % IV SOLN
1.0000 g | Freq: Three times a day (TID) | INTRAVENOUS | Status: AC
  Administered 2021-06-27 – 2021-07-01 (×14): 1 g via INTRAVENOUS
  Filled 2021-06-27 (×16): qty 1

## 2021-06-27 MED ORDER — SODIUM CHLORIDE 0.9 % IV SOLN
1.0000 g | INTRAVENOUS | Status: DC
  Administered 2021-06-27: 1 g via INTRAVENOUS
  Filled 2021-06-27: qty 10

## 2021-06-27 NOTE — Progress Notes (Signed)
Notified by CCMD that patient had an 8 beat run of Vtach. Zannie Cove MD notified. Pt appears to be asymptomatic.

## 2021-06-27 NOTE — Progress Notes (Signed)
Pharmacy Antibiotic Note  Vincent [name] is a 50 y.o. male admitted on 04/29/2021 with PEA arrest. Pt has had complicated hospital course. Currently on trach collar, concern for recurrent PNA. Pharmacy was been consulted for meropenem dosing after Klebsiella pneumoniae now grew from the patient's urine culture. Patient with history of recurrent ESBL kleb pneumonia and MSSA pneumonia. Will broaden therapy until sensitives are back.  WBC are normal at 9. Tmax 99.7.   Plan: -Meropenem 1gm IV q8h -Will follow renal function, cultures and clinical progress  Height: 5\' 8"  (172.7 cm) Weight: 63.2 kg (139 lb 6.4 oz) IBW/kg (Calculated) : 68.4  Temp (24hrs), Avg:98.2 F (36.8 C), Min:97.2 F (36.2 C), Max:98.7 F (37.1 C)  Recent Labs  Lab 06/21/21 0238 06/22/21 0159 06/23/21 0138 06/23/21 0933 06/26/21 0736 06/26/21 1027 06/26/21 1314 06/27/21 0803  WBC 9.8 11.6* 12.1* 11.0* 11.0*  --   --  9.9  CREATININE 0.60*  --   --  0.61 0.82  --   --   --   LATICACIDVEN  --   --   --   --   --  1.4 1.5  --      Estimated Creatinine Clearance: 95.3 mL/min (by C-G formula based on SCr of 0.82 mg/dL).    No Known Allergies  CTX 8/3>>8/7 Augmentin 8/8 >> 8/9 Zosyn 8/18>> 8/19; 8/25>>8/26; 9/12 >> 9/13 Unasyn 8/19 >>8/22 Fluconazole 8/25>>9/3 Vanc 9/1 x1 Cefepime 9/1 x1 Azithromycin 9/8>>9/12 Meropenem 9/13>>20, 9/30>   8/2 MRSA PCR negative 8/2 COVID/Flu negative 8/18 TA >> few staph epi (ox resistant) - likely contaminant 9/1 resp cx: few ESBL Klebsiella pneumoniae, few staph aureus (s aureus pan-S, Klebsiella S to zosyn and imipenem) - Dr 11/1 aware, not wanting to treat with infectious status improving clinically 9/1 bcx: ngF 9/1 &12: trach- Kleb Pn (ESBL) and MSSA 9/13 Bcx: NGeg 9/29 urine: kleb pnemo  10/29 PharmD., BCPS Clinical Pharmacist 06/27/2021 2:14 PM

## 2021-06-27 NOTE — Progress Notes (Addendum)
[facility] PROGRESS NOTE  Deakin Roxas Clymer ZJI:[id number] DOB: [birth year] DOA: 04/29/2021 PCP: Pcp, No  Status: Remains inpatient appropriate because:Altered mental status and Unsafe d/c plan  Dispo: The patient is from: Home              Anticipated d/c is to: SNF-trach capable              Patient currently is medically stable to d/c.  Difficult to place patient Yes               Barriers to discharge: Medicaid funding- permanent trach  Level of care: Progressive   Code Status: DNR Family Communication:  DVT prophylaxis: Subcutaneous heparin COVID vaccination status: Unknown  HPI:  50 y.o. 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  He has had repetitive blinking concerning for seizure activity but EEG has been negative.  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  Since admission he has been treated for sepsis secondary to Blood culture showed Klebsiella and MSSA, he did complete a course of antibiotics.  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 Goals of care discussion held by palliative care team.  Patient's sister was adamant about continuing full scope of treatment although patient does remain a DNR   Subjective: Awake and eyes open.  Nonverbal at baseline.  When I asked him to squeeze my hand and demonstrated on his right hand he made a crooked smile and attempted to raise his right hand up towards his mouth  Objective: Vitals:   06/27/21 0732 06/27/21 0812  BP: (!) 138/102 115/78  Pulse: (!) 112 (!) 115  Resp: (!) 26 (!) 29  Temp:  98.2 F (36.8 C)  SpO2: 97%     Intake/Output Summary (Last 24 hours)  at 06/27/2021 0815 Last data filed at 06/27/2021 0100 Gross per 24 hour  Intake 200 ml  Output 150 ml  Net 50 ml   Filed Weights   06/03/21 0500 06/04/21 0500 06/08/21 0353  Weight: 68.3 kg 68.4 kg 63.2 kg    Exam:  Constitutional: No acute distress, awake and appears to be intermittently interact Respiratory: #6.0 cuffed Shiley trach to trach collar, anterior lung sounds remain clear to auscultation, no increased work of breathing, no tracheal secretions, pulse oximetry 90% Cardiovascular: S1-S2, regular and tachycardic rate, no peripheral edema, skin warm and dry, currently normotensive Abdomen: no tenderness, soft and distended in context of known Ogilvie syndrome, G-tube in place for feedings, LBM 9/28-Bowel sounds positive.  Neurologic: CN 2-12 appear to be grossly intact on visual inspection.  Eyes are open.  Patient moving right upper extremity spontaneously and questionably with purpose.  Had changes in mouth consistent with either smiling or attempting to phonate  Psychiatric: Awake.  Nonverbal and unable to assess orientation  Significant Events: 8/2 PEA Arrest, King airway in the field, CPR x6 minutes and epi x1, ROSC, Intubated in ED, Admitted to [facility]  8/24 PEG placed 8/4 Weaned off TTM, Coox O2 satof 47%. 8/8 Post pyloric Cortrak placed, myoclonic jerking, started Keppra, depakote 8/10 Completed antibiotic course for pneumonia. Family meeting transitioned to DNR. 8/15 Tracheostomy  8/18 Febrile overnight, started Zosyn for aspiration pneumonia 8/22  Completed Abx course for aspiration pneumonia  8/24 PEG placed 8/31 PSV wean 2 hours 9/1 Stopped lasix, farxiga, losartan, spiro with AKI. Ongoing jerking, diaphoresis. WBC increased, cultured / abx started empirically.  PCT increased. CK 2,450 9/2 start scheduled opiates for possible opiate withdrawal symptoms 9/4 add flexeril, on PSV.  Versed x1 overnight. 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  Hypothermia/altered mental status 2/2 Klebsiella UTI Sepsis work-up negative, blood cultures pending Discussed with pharmacist and given history of ESBL positive Klebsiella in sputum decision made to stop Rocephin in favor of meropenem until final cultures and sensitivities returned WBC back to normal at 9.9  ?  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 No apparent bowel movement as of 9/28 AM  Continue Colace BID, Senokot and MiraLAX to BID Monitor frequency of bowel movements with introduction of low-dose Oxy IR Given MOM on 9/28 without any result Continue Nutrisource fiber twice daily to help bulk up stools further  Acute kidney injury with rhabdomyolysis: Resolved.  Hypovolemic hyponatremia: Resolved.    Acute combined systolic and diastolic heart failure with associated pulmonary hypertension: Continue Lopressor and Lasix as needed   Opiate  withdrawals: Resolved.  Nutrition: Status post PEG tube placement continue tube feedings and free water flushes.  Left upper lung nodule: Will need a follow-up CT in 3 months.  Hyperlipidemia: Continue Lipitor.   Goals of care: Persistent encephalopathy guarded prognosis palliative care was consulted sister was adamant about continuing full scope of treatment. The patient remains a DNR.        Data Reviewed: Basic Metabolic Panel: Recent Labs  Lab 06/21/21 0238 06/23/21 0933 06/26/21 0736  NA 133* 134* 134*  K 4.4 4.6 4.5  CL 99 96* 97*  CO2 25 26 25   GLUCOSE 122* 145* 134*  BUN 20 25* 29*  CREATININE 0.60* 0.61 0.82  CALCIUM 9.8 10.3 10.1   Liver Function Tests: Recent Labs  Lab 06/26/21 0730  AST 32  ALT 108*  ALKPHOS 71  BILITOT 0.6  PROT 7.6  ALBUMIN 3.7   No results for input(s): LIPASE, AMYLASE in the last 168 hours. No results for input(s): AMMONIA in the last 168 hours. CBC: Recent Labs  Lab 06/21/21 0238 06/22/21 0159 06/23/21 0138 06/23/21 0933 06/26/21 0736  WBC 9.8 11.6* 12.1* 11.0* 11.0*  NEUTROABS 5.6 6.8 7.4 7.1 6.0  HGB 12.0* 13.1 13.4 13.5 13.4  HCT 37.2* 39.5 40.5 41.2 40.7  MCV 87.5 87.0 87.3 87.5 86.6  PLT 504* 592* 536* 546* 514*    BNP (last 3 results) Recent Labs    04/29/21 2048 06/09/21 0350  BNP 626.7* 406.5*    CBG: Recent Labs  Lab 06/26/21 1616 06/26/21 2019 06/27/21 0014 06/27/21 0357 06/27/21 0746  GLUCAP 164* 111* 116* 118* 120*    Recent Results (from the past 240 hour(s))  Culture, blood (routine x 2)     Status: None (Preliminary result)   Collection Time: 06/26/21 10:27 AM   Specimen: BLOOD  Result Value Ref Range Status   Specimen Description BLOOD RIGHT ANTECUBITAL  Final   Special Requests   Final    BOTTLES DRAWN AEROBIC AND ANAEROBIC Blood Culture adequate volume   Culture   Final    NO GROWTH < 12 HOURS Performed at [facility], [address] [place], [place] [address]     Report Status PENDING  Incomplete  Culture, blood (routine x 2)     Status: None (Preliminary result)   Collection Time: 06/26/21 10:27 AM   Specimen: BLOOD LEFT HAND  Result Value Ref Range Status   Specimen Description BLOOD LEFT HAND  Final   Special Requests   Final    BOTTLES DRAWN AEROBIC AND ANAEROBIC Blood Culture adequate volume   Culture   Final    NO GROWTH < 12 HOURS Performed at [facility], [address], [place], [place] [address]    Report Status PENDING  Incomplete      Scheduled Meds:  arformoterol  15 mcg Nebulization BID   atorvastatin  40 mg Per Tube QHS   budesonide (PULMICORT) nebulizer solution  0.5 mg Nebulization BID   chlorhexidine  15 mL Mouth Rinse BID   clonazePAM  0.5 mg Per Tube Q8H   docusate  200 mg Per Tube BID   feeding supplement (PROSource TF)  45 mL Per Tube TID   fiber  1 packet Per Tube BID   free water  200 mL Per Tube Q6H   heparin  5,000 Units Subcutaneous Q8H   ibuprofen  400 mg Oral Q8H   levETIRAcetam  1,000 mg Per Tube BID   mouth rinse  15 mL Mouth Rinse q12n4p   metoprolol tartrate  75 mg Per Tube BID   pantoprazole sodium  40 mg Per Tube QHS   polyethylene glycol  17 g Per Tube BID   polyvinyl alcohol  2 drop Both Eyes Q6H   revefenacin  175 mcg Nebulization Daily   senna  1 tablet Per Tube BID   Continuous Infusions:  sodium chloride 250 mL (06/15/21 1719)   cefTRIAXone (ROCEPHIN)  IV 1 g (06/27/21 0749)   feeding supplement (JEVITY 1.5 CAL/FIBER) 1,000 mL (06/26/21 0029)    Active Problems:   Cardiac arrest (HCC)   Acute respiratory failure with hypoxia (HCC)   Seizure-like activity (HCC)   Anoxic encephalopathy (HCC)   Tracheostomy in place [facility])   Goals of care, counseling/discussion   AKI (acute kidney injury) (HCC)   Tracheostomy dependence (HCC)   Persistent vegetative state (HCC)   Generalized anxiety disorder   Other chronic pain   Ogilvie syndrome   Consultants: Neurology General  surgery Interventional radiology PCCM  Procedures: 2D echocardiogram Multiple EEGs Cortrak placement PEG tube placement  Antibiotics: Azithromycin 8/2 through 8/3 Cefepime 8/2 through 8/3 Ceftriaxone 8/3 through 8/7 Vancomycin 8/2 x 1 dose Zosyn 8/18 x 1 Unasyn 8/19 through 8/21 Cefazolin 8/24 through 8/24 Fluconazole 8/25 through 9/3 Zosyn 8/25 x 1 dose Cefepime 9/1 x 1 dose Vancomycin 9/1 x 1 dose Azithromycin 9/8 through 9/12 Zosyn 9/12 x 1 dose Meropenem 9/13 through/20  Time spent: 25 minutes    Junious Silk ANP  [facility] 7 am - 330 pm/M-F for direct patient care and secure chat Please refer to Amion for contact info 59  days

## 2021-06-27 NOTE — Plan of Care (Signed)

## 2021-06-28 LAB — GLUCOSE, CAPILLARY
Glucose-Capillary: 113 mg/dL — ABNORMAL HIGH (ref 70–99)
Glucose-Capillary: 117 mg/dL — ABNORMAL HIGH (ref 70–99)
Glucose-Capillary: 119 mg/dL — ABNORMAL HIGH (ref 70–99)
Glucose-Capillary: 121 mg/dL — ABNORMAL HIGH (ref 70–99)
Glucose-Capillary: 133 mg/dL — ABNORMAL HIGH (ref 70–99)
Glucose-Capillary: 150 mg/dL — ABNORMAL HIGH (ref 70–99)
Glucose-Capillary: 99 mg/dL (ref 70–99)

## 2021-06-28 LAB — URINE CULTURE: Culture: >=100000 — AB

## 2021-06-28 NOTE — Progress Notes (Signed)
Patient seen and examined -Briefly 50/M, with tobacco use, was admitted to the ICU following an out of hospital PEA arrest with ROSC following 6 minutes of resuscitation. 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 -Stabilized in the ICU, now with trach and PEG, anoxic encephalopathy, mostly nonverbal -Followed by palliative care, family wants to continue current medical treatment, he is DNR -Hospital course complicated by sepsis pneumonia, UTI,, myoclonic jerks started Keppra/Depakote etc, has been awaiting SNF that can manage tracheostomy -On 9/29 he was lethargic, hypothermic and diaphoretic, UA noted to be abnormal, urine cultures with Klebsiella, sensitivities pending, antibiotics switched to meropenem yesterday given known history of ESBL -Follow-up sensitivities -Continue current supportive care  Zannie Cove, MD

## 2021-06-28 NOTE — Plan of Care (Signed)

## 2021-06-29 LAB — CBC WITH DIFFERENTIAL/PLATELET
Abs Immature Granulocytes: 0.09 10*3/uL — ABNORMAL HIGH (ref 0.00–0.07)
Basophils Absolute: 0.1 10*3/uL (ref 0.0–0.1)
Basophils Relative: 1 %
Eosinophils Absolute: 0.2 10*3/uL (ref 0.0–0.5)
Eosinophils Relative: 2 %
HCT: 39.1 % (ref 39.0–52.0)
Hemoglobin: 12.7 g/dL — ABNORMAL LOW (ref 13.0–17.0)
Immature Granulocytes: 1 %
Lymphocytes Relative: 29 %
Lymphs Abs: 2.6 10*3/uL (ref 0.7–4.0)
MCH: 28.6 pg (ref 26.0–34.0)
MCHC: 32.5 g/dL (ref 30.0–36.0)
MCV: 88.1 fL (ref 80.0–100.0)
Monocytes Absolute: 0.8 10*3/uL (ref 0.1–1.0)
Monocytes Relative: 9 %
Neutro Abs: 5.2 10*3/uL (ref 1.7–7.7)
Neutrophils Relative %: 58 %
Platelets: 444 10*3/uL — ABNORMAL HIGH (ref 150–400)
RBC: 4.44 MIL/uL (ref 4.22–5.81)
RDW: 15.3 % (ref 11.5–15.5)
WBC: 8.9 10*3/uL (ref 4.0–10.5)
nRBC: 0 % (ref 0.0–0.2)

## 2021-06-29 LAB — BASIC METABOLIC PANEL
Anion gap: 10 (ref 5–15)
BUN: 14 mg/dL (ref 6–20)
CO2: 26 mmol/L (ref 22–32)
Calcium: 9.5 mg/dL (ref 8.9–10.3)
Chloride: 102 mmol/L (ref 98–111)
Creatinine, Ser: 0.58 mg/dL — ABNORMAL LOW (ref 0.61–1.24)
GFR, Estimated: >60 mL/min (ref >60)
Glucose, Bld: 130 mg/dL — ABNORMAL HIGH (ref 70–99)
Potassium: 4.2 mmol/L (ref 3.5–5.1)
Sodium: 138 mmol/L (ref 135–145)

## 2021-06-29 LAB — GLUCOSE, CAPILLARY
Glucose-Capillary: 120 mg/dL — ABNORMAL HIGH (ref 70–99)
Glucose-Capillary: 131 mg/dL — ABNORMAL HIGH (ref 70–99)
Glucose-Capillary: 132 mg/dL — ABNORMAL HIGH (ref 70–99)
Glucose-Capillary: 133 mg/dL — ABNORMAL HIGH (ref 70–99)
Glucose-Capillary: 134 mg/dL — ABNORMAL HIGH (ref 70–99)
Glucose-Capillary: 134 mg/dL — ABNORMAL HIGH (ref 70–99)
Glucose-Capillary: 143 mg/dL — ABNORMAL HIGH (ref 70–99)

## 2021-06-29 NOTE — Progress Notes (Signed)
Patient seen and examined -Briefly 50/M, with tobacco use, was admitted to the ICU following an out of hospital PEA arrest with ROSC following 6 minutes of resuscitation. 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 -Stabilized in the ICU, now with trach and PEG, anoxic encephalopathy, mostly nonverbal -Followed by palliative care, family wants to continue current medical treatment, he is DNR -Hospital course complicated by sepsis pneumonia, UTI,, myoclonic jerks started Keppra/Depakote etc, has been awaiting SNF that can manage tracheostomy -On 9/29 was noted to be more lethargic, hypothermic and profusely diaphoretic, UA noted to be abnormal, other infectious work-up was negative, urine culture growing ESBL E. Coli -Day 3 of meropenem today, continue this to complete 5-day therapy -Otherwise continue current supportive care, monitor for urinary retention   Zannie Cove, MD

## 2021-06-29 NOTE — Plan of Care (Signed)

## 2021-06-30 DIAGNOSIS — G931 Anoxic brain damage, not elsewhere classified: Secondary | ICD-10-CM | POA: Diagnosis not present

## 2021-06-30 DIAGNOSIS — B9689 Other specified bacterial agents as the cause of diseases classified elsewhere: Secondary | ICD-10-CM

## 2021-06-30 DIAGNOSIS — K5981 Ogilvie syndrome: Secondary | ICD-10-CM | POA: Diagnosis not present

## 2021-06-30 DIAGNOSIS — R403 Persistent vegetative state: Secondary | ICD-10-CM | POA: Diagnosis not present

## 2021-06-30 DIAGNOSIS — N390 Urinary tract infection, site not specified: Secondary | ICD-10-CM

## 2021-06-30 DIAGNOSIS — I469 Cardiac arrest, cause unspecified: Secondary | ICD-10-CM | POA: Diagnosis not present

## 2021-06-30 LAB — CBC
HCT: 40.2 % (ref 39.0–52.0)
Hemoglobin: 12.8 g/dL — ABNORMAL LOW (ref 13.0–17.0)
MCH: 28.4 pg (ref 26.0–34.0)
MCHC: 31.8 g/dL (ref 30.0–36.0)
MCV: 89.1 fL (ref 80.0–100.0)
Platelets: 437 10*3/uL — ABNORMAL HIGH (ref 150–400)
RBC: 4.51 MIL/uL (ref 4.22–5.81)
RDW: 15.6 % — ABNORMAL HIGH (ref 11.5–15.5)
WBC: 11.5 10*3/uL — ABNORMAL HIGH (ref 4.0–10.5)
nRBC: 0 % (ref 0.0–0.2)

## 2021-06-30 LAB — GLUCOSE, CAPILLARY
Glucose-Capillary: 130 mg/dL — ABNORMAL HIGH (ref 70–99)
Glucose-Capillary: 146 mg/dL — ABNORMAL HIGH (ref 70–99)
Glucose-Capillary: 97 mg/dL (ref 70–99)
Glucose-Capillary: 122 mg/dL — ABNORMAL HIGH (ref 70–99)
Glucose-Capillary: 117 mg/dL — ABNORMAL HIGH (ref 70–99)

## 2021-06-30 NOTE — Progress Notes (Signed)
Patient received in room 2W11. Patient has trach and PEG in place. PEG running Jevity 1.5 at 2ml/hr. Patient able to follow with his eyes but no verbal response. Cardiac monitor applied and CCMD called with second verifier. Condom cath in place, dry and intact. Respiratory therapy in room connecting oxygen to the trach. Patient on 5L oxygen sating 100%. VS checked. Provolone boats in place. No skin issues noted. Bed kept in low position and locked. Call bell in reach. Bed alarm on. Will continue to monitor.

## 2021-06-30 NOTE — Progress Notes (Signed)
[facility] PROGRESS NOTE  Camp Easton Fetty QIW:[id number] DOB: 01/29/1971 DOA: 04/29/2021 PCP: Pcp, No  Status: Remains inpatient appropriate because:Altered mental status and Unsafe d/c plan  Dispo: The patient is from: Home              Anticipated d/c is to: SNF-trach capable              Patient currently is medically stable to d/c.  Difficult to place patient Yes               Barriers to discharge: Medicaid funding- permanent trach  Level of care: Progressive   Code Status: DNR Family Communication:  DVT prophylaxis: Subcutaneous heparin COVID vaccination status: Unknown  HPI:  50 y.o. 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  He has had repetitive blinking concerning for seizure activity but EEG has been negative.  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  Since admission he has been treated for sepsis secondary to Blood culture showed Klebsiella and MSSA, he did complete a course of antibiotics.  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 Goals of care discussion held by palliative care team.  Patient's sister was adamant about continuing full scope of treatment although patient does remain a DNR   Subjective: Patient lethargic and did not open his eyes to voice, tactile stimulation or to pain.-Of note patient does have episodes like this intermittently  Objective: Vitals:   06/30/21 0748 06/30/21 0752  BP:  134/72  Pulse: (!) 111 (!) 114  Resp: (!) 21 (!) 22  Temp:  97.6 F (36.4 C)  SpO2: 99% 99%    Intake/Output Summary (Last 24 hours) at 06/30/2021 0814 Last data filed at 06/30/2021 4174 Gross per  24 hour  Intake 275.88 ml  Output 1100 ml  Net -824.12 ml   Filed Weights   06/03/21 0500 06/04/21 0500 06/08/21 0353  Weight: 68.3 kg 68.4 kg 63.2 kg    Exam:  Constitutional: No acute distress, more lethargic today Respiratory: #6.0 cuffed Shiley trach to trach collar, anterior lung sounds remain clear to auscultation, no increased work of breathing, no tracheal secretions, pulse oximetry 98% Cardiovascular: S1-S2, regular and tachycardic rate, no peripheral edema, appropriate capillary refill, skin warm and dry Abdomen: no tenderness, soft and distended in context of known Ogilvie syndrome, G-tube in place for feedings, LBM 10/2-Bowel sounds positive.  Neurologic: CN 2-12 appear to be grossly intact on visual inspection.  At times he is able to open his eyes and track people in the room as well as make purposeful movements and mouth movements that may reflect attempts to vocalize.  Patient able to right upper extremity spontaneously and questionably with purpose at baseline but today he is more lethargic and I am unable to get him to awaken.   Psychiatric: Lethargic today  Significant Events: 8/2 PEA Arrest, King airway in the field, CPR x6 minutes and epi x1, ROSC, Intubated in ED, Admitted to [facility]  8/24 PEG placed 8/4 Weaned off TTM, Coox O2 satof 47%. 8/8 Post pyloric Cortrak placed, myoclonic jerking, started Keppra, depakote 8/10 Completed antibiotic course for pneumonia. Family meeting transitioned to DNR. 8/15 Tracheostomy  8/18 Febrile  overnight, started Zosyn for aspiration pneumonia 8/22 Completed Abx course for aspiration pneumonia  8/24 PEG placed 8/31 PSV wean 2 hours 9/1 Stopped lasix, farxiga, losartan, spiro with AKI. Ongoing jerking, diaphoresis. WBC increased, cultured / abx started empirically.  PCT increased. CK 2,450 9/2 start scheduled opiates for possible opiate withdrawal symptoms 9/4 add flexeril, on PSV.  Versed x1 overnight. 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  Hypothermia/altered mental status 2/2 ESBL Klebsiella UTI Sepsis work-up negative Continue meropenem day# 4/5  ?  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  Hypovolemic hyponatremia: Resolved.    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  Nutrition: Status post PEG tube placement continue tube feedings and free water flushes.  Left upper lung nodule: Will need a follow-up CT in 3 months.  Hyperlipidemia: Continue Lipitor.   Goals of care: Persistent encephalopathy guarded prognosis palliative care was consulted sister was adamant about continuing full scope of treatment. The patient remains a DNR.        Data Reviewed: Basic Metabolic Panel: Recent Labs  Lab 06/23/21 0933 06/26/21 0736 06/27/21 1756 06/29/21 0810  NA 134* 134* 134* 138  K 4.6 4.5 4.4 4.2  CL 96* 97* 97* 102  CO2  26 25 27 26   GLUCOSE 145* 134* 111* 130*  BUN 25* 29* 25* 14  CREATININE 0.61 0.82 0.70 0.58*  CALCIUM 10.3 10.1 9.7 9.5  MG  --   --  2.1  --    Liver Function Tests: Recent Labs  Lab 06/26/21 0730  AST 32  ALT 108*  ALKPHOS 71  BILITOT 0.6  PROT 7.6  ALBUMIN 3.7   No results for input(s): LIPASE, AMYLASE in the last 168 hours. No results for input(s): AMMONIA in the last 168 hours. CBC: Recent Labs  Lab 06/23/21 0933 06/26/21 0736 06/27/21 0803 06/29/21 0810 06/30/21 0210  WBC 11.0* 11.0* 9.9 8.9 11.5*  NEUTROABS 7.1 6.0  --  5.2  --   HGB 13.5 13.4 12.5* 12.7* 12.8*  HCT 41.2 40.7 38.2* 39.1 40.2  MCV 87.5 86.6 87.8 88.1 89.1  PLT 546* 514* 447* 444* 437*    BNP (last 3 results) Recent Labs    04/29/21 2048 06/09/21 0350  BNP 626.7* 406.5*    CBG: Recent Labs  Lab 06/29/21 1147 06/29/21 1518 06/29/21 2136 06/29/21 2339 06/30/21 0717  GLUCAP 143* 131* 133* 134* 130*    Recent Results (from the past 240 hour(s))  Culture, blood (routine x 2)     Status: None (Preliminary result)   Collection Time: 06/26/21 10:27 AM   Specimen: BLOOD  Result Value Ref Range Status   Specimen Description BLOOD RIGHT ANTECUBITAL  Final   Special Requests   Final    BOTTLES DRAWN AEROBIC AND ANAEROBIC Blood Culture adequate volume   Culture   Final    NO GROWTH 4 DAYS Performed at [facility], [address], [place], [place] [address]    Report Status PENDING  Incomplete  Culture, blood (routine x 2)     Status: None (Preliminary result)   Collection Time: 06/26/21 10:27 AM   Specimen: BLOOD LEFT HAND  Result Value Ref Range Status   Specimen Description BLOOD LEFT HAND  Final   Special Requests   Final    BOTTLES DRAWN AEROBIC AND ANAEROBIC Blood Culture adequate volume   Culture   Final    NO GROWTH 4 DAYS Performed at [facility], [address], [place], [place] [address]    Report Status PENDING  Incomplete  Urine Culture     Status:  Abnormal   Collection Time: 06/26/21  1:42 PM   Specimen: Urine, Catheterized  Result Value Ref Range Status   Specimen Description URINE, CATHETERIZED  Final   Special Requests   Final    NONE Performed at [facility], [address], [place], [place] [address]    Culture (A)  Final    >=100,000 COLONIES/mL KLEBSIELLA PNEUMONIAE Confirmed Extended Spectrum Beta-Lactamase Producer (ESBL).  In bloodstream infections from ESBL organisms, carbapenems are preferred over piperacillin/tazobactam. They are shown to have a lower risk of mortality.    Report Status 06/28/2021 FINAL  Final   Organism ID, Bacteria KLEBSIELLA PNEUMONIAE (A)  Final      Susceptibility   Klebsiella pneumoniae - MIC*    AMPICILLIN >=32 RESISTANT Resistant     CEFAZOLIN >=64 RESISTANT Resistant     CEFEPIME >=32 RESISTANT Resistant     CEFTRIAXONE >=64 RESISTANT Resistant     CIPROFLOXACIN >=4 RESISTANT Resistant     GENTAMICIN >=16 RESISTANT Resistant     IMIPENEM <=0.25 SENSITIVE Sensitive     NITROFURANTOIN 128 RESISTANT Resistant     TRIMETH/SULFA >=320 RESISTANT Resistant     AMPICILLIN/SULBACTAM >=32 RESISTANT Resistant     PIP/TAZO 16 SENSITIVE Sensitive     * >=100,000 COLONIES/mL KLEBSIELLA PNEUMONIAE      Scheduled Meds:  arformoterol  15 mcg Nebulization BID   atorvastatin  40 mg Per Tube QHS   budesonide (PULMICORT) nebulizer solution  0.5 mg Nebulization BID   chlorhexidine  15 mL Mouth Rinse BID   clonazePAM  0.5 mg Per Tube Q8H   docusate  200 mg Per Tube BID   feeding supplement (PROSource TF)  45 mL Per Tube TID   fiber  1 packet Per Tube BID   free water  200 mL Per Tube Q6H   heparin  5,000 Units Subcutaneous Q8H   ibuprofen  400 mg Oral Q8H   levETIRAcetam  1,000 mg Per Tube BID   mouth rinse  15 mL Mouth Rinse q12n4p   metoprolol tartrate  75 mg Per Tube BID   pantoprazole sodium  40 mg Per Tube QHS   polyethylene glycol  17 g Per Tube BID   polyvinyl alcohol  2 drop Both  Eyes Q6H   revefenacin  175 mcg Nebulization Daily   senna  1 tablet Per Tube BID   Continuous Infusions:  sodium chloride 250 mL (06/15/21 1719)   feeding supplement (JEVITY 1.5 CAL/FIBER) 1,000 mL (06/29/21 1125)  meropenem (MERREM) IV 1 g (06/30/21 0538)    Active Problems:   Cardiac arrest (HCC)   Acute respiratory failure with hypoxia (HCC)   Seizure-like activity (HCC)   Anoxic encephalopathy (HCC)   Tracheostomy in place [facility])   Goals of care, counseling/discussion   AKI (acute kidney injury) (HCC)   Tracheostomy dependence (HCC)   Persistent vegetative state (HCC)   Generalized anxiety disorder   Other chronic pain   Ogilvie syndrome   Gram-negative infection   Acute UTI   Consultants: Neurology General surgery Interventional radiology PCCM  Procedures: 2D echocardiogram Multiple EEGs Cortrak placement PEG tube placement  Antibiotics: Azithromycin 8/2 through 8/3 Cefepime 8/2 through 8/3 Ceftriaxone 8/3 through 8/7 Vancomycin 8/2 x 1 dose Zosyn 8/18 x 1 Unasyn 8/19 through 8/21 Cefazolin 8/24 through 8/24 Fluconazole 8/25 through 9/3 Zosyn 8/25 x 1 dose Cefepime 9/1 x 1 dose Vancomycin 9/1 x 1 dose Azithromycin 9/8 through 9/12 Zosyn 9/12 x 1 dose Meropenem 9/13 through/20   Time spent: 25 minutes    Junious Silk ANP  [facility] 7 am - 330 pm/M-F for direct patient care and secure chat Please refer to Amion for contact info 62  days

## 2021-06-30 NOTE — Progress Notes (Signed)
Pharmacy Antibiotic Note  Vincent [name] is a 50 y.o. male admitted on 04/29/2021 with PEA arrest. Pt has had complicated hospital course. Currently on trach collar, concern for recurrent PNA. Pharmacy was been consulted for meropenem dosing after Klebsiella pneumoniae now grew from the patient's urine culture with lots of resistance. Planning 5 days per MD.  Plan: -Meropenem 1gm IV q8h -Will follow renal function, cultures and clinical progress  Height: 5\' 8"  (172.7 cm) Weight: 63.2 kg (139 lb 6.4 oz) IBW/kg (Calculated) : 68.4  Temp (24hrs), Avg:98.8 F (37.1 C), Min:98.2 F (36.8 C), Max:100.3 F (37.9 C)  Recent Labs  Lab 06/23/21 0933 06/26/21 0736 06/26/21 1027 06/26/21 1314 06/27/21 0803 06/27/21 1756 06/29/21 0810 06/30/21 0210  WBC 11.0* 11.0*  --   --  9.9  --  8.9 11.5*  CREATININE 0.61 0.82  --   --   --  0.70 0.58*  --   LATICACIDVEN  --   --  1.4 1.5  --   --   --   --      Estimated Creatinine Clearance: 97.7 mL/min (A) (by C-G formula based on SCr of 0.58 mg/dL (L)).    No Known Allergies  CTX 8/3>>8/7 Augmentin 8/8 >> 8/9 Zosyn 8/18>> 8/19; 8/25>>8/26; 9/12 >> 9/13 Unasyn 8/19 >>8/22 Fluconazole 8/25>>9/3 Vanc 9/1 x1 Cefepime 9/1 x1 Azithromycin 9/8>>9/12 Meropenem 9/13>>20, 9/30>   8/2 MRSA PCR negative 8/2 COVID/Flu negative 8/18 TA >> few staph epi (ox resistant) - likely contaminant 9/1 resp cx: few ESBL Klebsiella pneumoniae, few staph aureus (s aureus pan-S, Klebsiella S to zosyn and imipenem) - Dr 11/1 aware, not wanting to treat with infectious status improving clinically 9/1 bcx: ngF 9/1 &12: trach- Kleb Pn (ESBL) and MSSA 9/13 Bcx: NGeg 9/29 urine: kleb pnemo  10/29, PharmD, BCPS, [facility] Clinical Pharmacist [phone] Please check AMION for all [facility] Pharmacy numbers 06/30/2021

## 2021-06-30 NOTE — Plan of Care (Signed)
  Problem: Clinical Measurements: Goal: Diagnostic test results will improve Outcome: Progressing Goal: Respiratory complications will improve Outcome: Progressing   Problem: Nutrition: Goal: Adequate nutrition will be maintained Outcome: Progressing   

## 2021-07-01 DIAGNOSIS — I469 Cardiac arrest, cause unspecified: Secondary | ICD-10-CM | POA: Diagnosis not present

## 2021-07-01 DIAGNOSIS — G931 Anoxic brain damage, not elsewhere classified: Secondary | ICD-10-CM | POA: Diagnosis not present

## 2021-07-01 DIAGNOSIS — K5981 Ogilvie syndrome: Secondary | ICD-10-CM | POA: Diagnosis not present

## 2021-07-01 DIAGNOSIS — F411 Generalized anxiety disorder: Secondary | ICD-10-CM | POA: Diagnosis not present

## 2021-07-01 LAB — GLUCOSE, CAPILLARY
Glucose-Capillary: 107 mg/dL — ABNORMAL HIGH (ref 70–99)
Glucose-Capillary: 111 mg/dL — ABNORMAL HIGH (ref 70–99)
Glucose-Capillary: 119 mg/dL — ABNORMAL HIGH (ref 70–99)
Glucose-Capillary: 135 mg/dL — ABNORMAL HIGH (ref 70–99)
Glucose-Capillary: 142 mg/dL — ABNORMAL HIGH (ref 70–99)
Glucose-Capillary: 81 mg/dL (ref 70–99)

## 2021-07-01 LAB — CULTURE, BLOOD (ROUTINE X 2)
Culture: NO GROWTH
Culture: NO GROWTH
Special Requests: ADEQUATE
Special Requests: ADEQUATE

## 2021-07-01 MED ORDER — CLONAZEPAM 0.5 MG PO TABS
0.5000 mg | ORAL_TABLET | Freq: Two times a day (BID) | ORAL | Status: DC
  Administered 2021-07-01 – 2021-07-13 (×25): 0.5 mg
  Filled 2021-07-01 (×25): qty 1

## 2021-07-01 NOTE — Progress Notes (Signed)
[facility] PROGRESS NOTE  Vincent [name]:[id number] DOB: 08-23-1971 DOA: 04/29/2021 PCP: Pcp, No  Status: Remains inpatient appropriate because:Altered mental status and Unsafe d/c plan  Dispo: The patient is from: Home              Anticipated d/c is to: SNF-trach capable              Patient currently is medically stable to d/c.  Difficult to place patient Yes               Barriers to discharge: Medicaid funding- permanent trach  Level of care: Progressive   Code Status: DNR Family Communication:  DVT prophylaxis: Subcutaneous heparin COVID vaccination status: Unknown  HPI:  50 y.o. 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  He has had repetitive blinking concerning for seizure activity but EEG has been negative.  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  Since admission he has been treated for sepsis secondary to Blood culture showed Klebsiella and MSSA, he did complete a course of antibiotics.  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 Goals of care discussion held by palliative care team.  Patient's sister was adamant about continuing full scope of treatment although patient does remain a DNR   Subjective: More alert today.  Tracking persons in the room.  When spoken to appear to acknowledge what was said to him by raising up both of his eyebrows.  Objective: Vitals:   06/30/21 2118 06/30/21 2310  BP: 137/84   Pulse: (!) 116   Resp: (!) 24   Temp: 98 F (36.7 C)   SpO2: 100% 99%    Intake/Output Summary (Last 24 hours) at 07/01/2021 0749 Last data filed at 07/01/2021 4174 Gross per 24 hour   Intake 1580 ml  Output 1100 ml  Net 480 ml   Filed Weights   06/03/21 0500 06/04/21 0500 06/08/21 0353  Weight: 68.3 kg 68.4 kg 63.2 kg    Exam:  Constitutional: No acute distress, more lethargic today Respiratory: #6.0 cuffed Shiley trach to trach collar, lung sounds clear to auscultation, no increased work of breathing, pulse ox 9100% Cardiovascular: Normal S1-S2, sinus tachycardia no peripheral edema, appropriate capillary refill, skin warm and dry Abdomen: no tenderness, soft and distended (Ogilvie syndrome), G-tube in place for feedings, LBM 10/2-Bowel sounds positive.  Neurologic: CN 2-12 appear to be grossly intact on visual inspection.  Tracking persons in room and appeared to acknowledge spoking 2 by raising up both eyebrows simultaneously.  Occasionally has purposeful movement of RUE-unable to follow commands Psychiatric: Much more alert today and tracking persons around the room  Significant Events: 8/2 PEA Arrest, King airway in the field, CPR x6 minutes and epi x1, ROSC, Intubated in ED, Admitted to [facility]  8/24 PEG placed 8/4 Weaned off TTM, Coox O2 satof 47%. 8/8 Post pyloric Cortrak placed, myoclonic jerking, started Keppra, depakote 8/10 Completed antibiotic course for pneumonia. Family meeting transitioned to DNR. 8/15 Tracheostomy  8/18 Febrile overnight, started Zosyn for aspiration pneumonia 8/22 Completed Abx course for aspiration pneumonia  8/24 PEG placed 8/31 PSV wean 2 hours 9/1 Stopped lasix, farxiga, losartan, spiro with AKI. Ongoing jerking, diaphoresis. WBC  increased, cultured / abx started empirically.  PCT increased. CK 2,450 9/2 start scheduled opiates for possible opiate withdrawal symptoms 9/4 add flexeril, on PSV.  Versed x1 overnight. 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  Hypothermia/altered mental status 2/2 ESBL Klebsiella UTI Sepsis work-up negative Continue meropenem day# 5/5  ?  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  Hypovolemic hyponatremia: Resolved.    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  Nutrition: Status post PEG tube placement continue tube feedings and free water flushes.  Left upper lung nodule: Will need a follow-up CT in 3 months.  Hyperlipidemia: Continue Lipitor.   Goals of care: Persistent encephalopathy guarded prognosis palliative care was consulted sister was adamant about continuing full scope of treatment. The patient remains a DNR.        Data Reviewed: Basic Metabolic Panel: Recent Labs  Lab 06/26/21 0736 06/27/21 1756 06/29/21 0810  NA 134* 134* 138  K 4.5 4.4 4.2  CL 97* 97* 102  CO2 25 27 26   GLUCOSE 134* 111* 130*  BUN 29* 25* 14  CREATININE 0.82 0.70 0.58*  CALCIUM 10.1 9.7 9.5  MG  --  2.1  --    Liver Function Tests: Recent Labs  Lab  06/26/21 0730  AST 32  ALT 108*  ALKPHOS 71  BILITOT 0.6  PROT 7.6  ALBUMIN 3.7   No results for input(s): LIPASE, AMYLASE in the last 168 hours. No results for input(s): AMMONIA in the last 168 hours. CBC: Recent Labs  Lab 06/26/21 0736 06/27/21 0803 06/29/21 0810 06/30/21 0210  WBC 11.0* 9.9 8.9 11.5*  NEUTROABS 6.0  --  5.2  --   HGB 13.4 12.5* 12.7* 12.8*  HCT 40.7 38.2* 39.1 40.2  MCV 86.6 87.8 88.1 89.1  PLT 514* 447* 444* 437*    BNP (last 3 results) Recent Labs    04/29/21 2048 06/09/21 0350  BNP 626.7* 406.5*    CBG: Recent Labs  Lab 06/30/21 1130 06/30/21 1749 06/30/21 2112 06/30/21 2311 07/01/21 0415  GLUCAP 146* 97 122* 117* 119*    Recent Results (from the past 240 hour(s))  Culture, blood (routine x 2)     Status: None   Collection Time: 06/26/21 10:27 AM   Specimen: BLOOD  Result Value Ref Range Status   Specimen Description BLOOD RIGHT ANTECUBITAL  Final   Special Requests   Final    BOTTLES DRAWN AEROBIC AND ANAEROBIC Blood Culture adequate volume   Culture   Final    NO GROWTH 5 DAYS Performed at [facility]  Lab, [address] [place], [place] [address]    Report Status 07/01/2021 FINAL  Final  Culture, blood (routine x 2)     Status: None   Collection Time: 06/26/21 10:27 AM   Specimen: BLOOD LEFT HAND  Result Value Ref Range Status   Specimen Description BLOOD LEFT HAND  Final   Special Requests   Final    BOTTLES DRAWN AEROBIC AND ANAEROBIC Blood Culture adequate volume   Culture   Final    NO GROWTH 5 DAYS Performed at [facility] Lab, [address], [place], [place] [address]    Report Status 07/01/2021 FINAL  Final  Urine Culture     Status: Abnormal   Collection Time: 06/26/21  1:42 PM   Specimen: Urine, Catheterized  Result Value Ref Range Status   Specimen Description URINE, CATHETERIZED  Final   Special Requests   Final    NONE Performed at [facility] Lab, [address], [place], [place] [address]     Culture (A)  Final    >=100,000 COLONIES/mL KLEBSIELLA PNEUMONIAE Confirmed Extended Spectrum Beta-Lactamase Producer (ESBL).  In bloodstream infections from ESBL organisms, carbapenems are preferred over piperacillin/tazobactam. They are shown to have a lower risk of mortality.    Report Status 06/28/2021 FINAL  Final   Organism ID, Bacteria KLEBSIELLA PNEUMONIAE (A)  Final      Susceptibility   Klebsiella pneumoniae - MIC*    AMPICILLIN >=32 RESISTANT Resistant     CEFAZOLIN >=64 RESISTANT Resistant     CEFEPIME >=32 RESISTANT Resistant     CEFTRIAXONE >=64 RESISTANT Resistant     CIPROFLOXACIN >=4 RESISTANT Resistant     GENTAMICIN >=16 RESISTANT Resistant     IMIPENEM <=0.25 SENSITIVE Sensitive     NITROFURANTOIN 128 RESISTANT Resistant     TRIMETH/SULFA >=320 RESISTANT Resistant     AMPICILLIN/SULBACTAM >=32 RESISTANT Resistant     PIP/TAZO 16 SENSITIVE Sensitive     * >=100,000 COLONIES/mL KLEBSIELLA PNEUMONIAE      Scheduled Meds:  arformoterol  15 mcg Nebulization BID   atorvastatin  40 mg Per Tube QHS   budesonide (PULMICORT) nebulizer solution  0.5 mg Nebulization BID   chlorhexidine  15 mL Mouth Rinse BID   clonazePAM  0.5 mg Per Tube Q8H   docusate  200 mg Per Tube BID   feeding supplement (PROSource TF)  45 mL Per Tube TID   fiber  1 packet Per Tube BID   free water  200 mL Per Tube Q6H   heparin  5,000 Units Subcutaneous Q8H   ibuprofen  400 mg Oral Q8H   levETIRAcetam  1,000 mg Per Tube BID   mouth rinse  15 mL Mouth Rinse q12n4p   metoprolol tartrate  75 mg Per Tube BID   pantoprazole sodium  40 mg Per Tube QHS   polyethylene glycol  17 g Per Tube BID   polyvinyl alcohol  2 drop Both Eyes Q6H   revefenacin  175 mcg Nebulization Daily   senna  1 tablet Per Tube BID   Continuous Infusions:  sodium chloride 250 mL (06/15/21 1719)   feeding supplement (JEVITY 1.5 CAL/FIBER) 1,000 mL (07/01/21 0145)   meropenem (MERREM) IV 1 g (07/01/21 0507)    Active  Problems:   Cardiac arrest (HCC)   Acute respiratory failure with hypoxia (HCC)   Seizure-like activity (HCC)   Anoxic encephalopathy (HCC)   Tracheostomy in place [facility])   Goals of care, counseling/discussion   AKI (  acute kidney injury) (HCC)   Tracheostomy dependence (HCC)   Persistent vegetative state (HCC)   Generalized anxiety disorder   Other chronic pain   Ogilvie syndrome   Gram-negative infection   Acute UTI   Urinary tract infection due to ESBL Klebsiella   Consultants: Neurology General surgery Interventional radiology PCCM  Procedures: 2D echocardiogram Multiple EEGs Cortrak placement PEG tube placement  Antibiotics: Azithromycin 8/2 through 8/3 Cefepime 8/2 through 8/3 Ceftriaxone 8/3 through 8/7 Vancomycin 8/2 x 1 dose Zosyn 8/18 x 1 Unasyn 8/19 through 8/21 Cefazolin 8/24 through 8/24 Fluconazole 8/25 through 9/3 Zosyn 8/25 x 1 dose Cefepime 9/1 x 1 dose Vancomycin 9/1 x 1 dose Azithromycin 9/8 through 9/12 Zosyn 9/12 x 1 dose Meropenem 9/13 through/20   Time spent: 25 minutes    Junious Silk ANP  [facility] 7 am - 330 pm/M-F for direct patient care and secure chat Please refer to Amion for contact info 63  days

## 2021-07-01 NOTE — Plan of Care (Signed)
  Problem: Role Relationship: Goal: Method of communication will improve Outcome: Progressing   Problem: Clinical Measurements: Goal: Diagnostic test results will improve Outcome: Progressing

## 2021-07-02 DIAGNOSIS — G931 Anoxic brain damage, not elsewhere classified: Secondary | ICD-10-CM | POA: Diagnosis not present

## 2021-07-02 DIAGNOSIS — I469 Cardiac arrest, cause unspecified: Secondary | ICD-10-CM | POA: Diagnosis not present

## 2021-07-02 DIAGNOSIS — R403 Persistent vegetative state: Secondary | ICD-10-CM | POA: Diagnosis not present

## 2021-07-02 DIAGNOSIS — K5981 Ogilvie syndrome: Secondary | ICD-10-CM | POA: Diagnosis not present

## 2021-07-02 LAB — CBC WITH DIFFERENTIAL/PLATELET
Abs Immature Granulocytes: 0.12 10*3/uL — ABNORMAL HIGH (ref 0.00–0.07)
Basophils Absolute: 0.1 10*3/uL (ref 0.0–0.1)
Basophils Relative: 1 %
Eosinophils Absolute: 0.2 10*3/uL (ref 0.0–0.5)
Eosinophils Relative: 2 %
HCT: 40.2 % (ref 39.0–52.0)
Hemoglobin: 12.8 g/dL — ABNORMAL LOW (ref 13.0–17.0)
Immature Granulocytes: 1 %
Lymphocytes Relative: 37 %
Lymphs Abs: 3.3 10*3/uL (ref 0.7–4.0)
MCH: 28.2 pg (ref 26.0–34.0)
MCHC: 31.8 g/dL (ref 30.0–36.0)
MCV: 88.5 fL (ref 80.0–100.0)
Monocytes Absolute: 0.9 10*3/uL (ref 0.1–1.0)
Monocytes Relative: 10 %
Neutro Abs: 4.3 10*3/uL (ref 1.7–7.7)
Neutrophils Relative %: 49 %
Platelets: 383 10*3/uL (ref 150–400)
RBC: 4.54 MIL/uL (ref 4.22–5.81)
RDW: 15.9 % — ABNORMAL HIGH (ref 11.5–15.5)
WBC: 8.9 10*3/uL (ref 4.0–10.5)
nRBC: 0 % (ref 0.0–0.2)

## 2021-07-02 LAB — BASIC METABOLIC PANEL
Anion gap: 10 (ref 5–15)
BUN: 16 mg/dL (ref 6–20)
CO2: 24 mmol/L (ref 22–32)
Calcium: 9.8 mg/dL (ref 8.9–10.3)
Chloride: 100 mmol/L (ref 98–111)
Creatinine, Ser: 0.6 mg/dL — ABNORMAL LOW (ref 0.61–1.24)
GFR, Estimated: >60 mL/min (ref >60)
Glucose, Bld: 137 mg/dL — ABNORMAL HIGH (ref 70–99)
Potassium: 4.2 mmol/L (ref 3.5–5.1)
Sodium: 134 mmol/L — ABNORMAL LOW (ref 135–145)

## 2021-07-02 LAB — GLUCOSE, CAPILLARY
Glucose-Capillary: 106 mg/dL — ABNORMAL HIGH (ref 70–99)
Glucose-Capillary: 107 mg/dL — ABNORMAL HIGH (ref 70–99)
Glucose-Capillary: 131 mg/dL — ABNORMAL HIGH (ref 70–99)
Glucose-Capillary: 136 mg/dL — ABNORMAL HIGH (ref 70–99)
Glucose-Capillary: 145 mg/dL — ABNORMAL HIGH (ref 70–99)

## 2021-07-02 LAB — TROPONIN I (HIGH SENSITIVITY): Troponin I (High Sensitivity): 32 ng/L — ABNORMAL HIGH (ref <18)

## 2021-07-02 MED ORDER — KETOTIFEN FUMARATE 0.025 % OP SOLN
1.0000 [drp] | Freq: Two times a day (BID) | OPHTHALMIC | Status: DC
  Administered 2021-07-02 – 2021-07-22 (×41): 1 [drp] via OPHTHALMIC
  Filled 2021-07-02: qty 5

## 2021-07-02 MED ORDER — METOPROLOL TARTRATE 5 MG/5ML IV SOLN
2.5000 mg | Freq: Three times a day (TID) | INTRAVENOUS | Status: DC | PRN
  Administered 2021-07-05 – 2021-07-19 (×3): 2.5 mg via INTRAVENOUS
  Filled 2021-07-02 (×3): qty 5

## 2021-07-02 NOTE — Progress Notes (Signed)
[facility] PROGRESS NOTE  Maks Delaney Schnick ZOX:[id number] DOB: [birth year] DOA: 04/29/2021 PCP: Pcp, No  Status: Remains inpatient appropriate because:Altered mental status and Unsafe d/c plan  Dispo: The patient is from: Home              Anticipated d/c is to: SNF-trach capable              Patient currently is medically stable to d/c.  Difficult to place patient Yes               Barriers to discharge: Medicaid funding- permanent trach  Level of care: Progressive   Code Status: DNR Family Communication:  DVT prophylaxis: Subcutaneous heparin COVID vaccination status: Unknown  HPI:  50 y.o. 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  He has had repetitive blinking concerning for seizure activity but EEG has been negative.  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  Since admission he has been treated for sepsis secondary to Blood culture showed Klebsiella and MSSA, he did complete a course of antibiotics.  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 Goals of care discussion held by palliative care team.  Patient's sister was adamant about continuing full scope of treatment although patient does remain a DNR   Subjective: Awake and is at baseline nonverbal state.  Does track movement around room.  Does focus on speaker when he is spoken to.  Objective: Vitals:   07/02/21 0346 07/02/21 0728  BP: 107/73   Pulse: (!) 106 (!) 106  Resp: (!) 25 20  Temp: 98.1 F (36.7 C)   SpO2: 99% 100%    Intake/Output Summary (Last 24 hours) at 07/02/2021 0734 Last data filed at 07/01/2021 2348 Gross per 24 hour  Intake 1720 ml   Output 1850 ml  Net -130 ml   Filed Weights   06/03/21 0500 06/04/21 0500 06/08/21 0353  Weight: 68.3 kg 68.4 kg 63.2 kg    Exam:  Constitutional: No acute distress, more lethargic today ENT: Medial sclera injected, no drainage Respiratory: #6.0 cuffed Shiley trach to trach collar, room air, anterior lung sounds are clear, no increased work of breathing, no tracheal secretions noted, pulse oximetry 98 to 99% Cardiovascular: Normal S1-S2, no peripheral edema, persistent but stable tachycardia, enceph otherwise Abdomen: no tenderness, soft and distended (Ogilvie syndrome), G-tube site unremarkable, LBM 10/05, normoactive bowel sounds Neurologic: CN 2-12 grossly intact Psychiatric:   Significant Events: 8/2 PEA Arrest, King airway in the field, CPR x6 minutes and epi x1, ROSC, Intubated in ED, Admitted to [facility]  8/24 PEG placed 8/4 Weaned off TTM, Coox O2 satof 47%. 8/8 Post pyloric Cortrak placed, myoclonic jerking, started Keppra, depakote 8/10 Completed antibiotic course for pneumonia. Family meeting transitioned to DNR. 8/15 Tracheostomy  8/18 Febrile overnight, started Zosyn for aspiration pneumonia 8/22 Completed Abx course for aspiration pneumonia  8/24 PEG placed 8/31 PSV wean 2 hours 9/1 Stopped lasix, farxiga, losartan, spiro with AKI. Ongoing jerking, diaphoresis. WBC increased, cultured / abx started empirically.  PCT increased. CK 2,450 9/2 start scheduled opiates for possible opiate withdrawal symptoms 9/4 add flexeril, on PSV.  Versed x1 overnight. 9/9 has tolerated trach collar for 48+hours 9/23  repetitive blinking with concerns for seizure activity-EEG ordered -was negative  Assessment/Plan: Acute problems: Anoxic brain injury status post PEA arrest in a vegetative state/myoclonus seizures: Continue Keppra  Acute respiratory failure with hypoxia in the setting of cardiac arrest and aspiration pneumonia: Stable on trach collar Trach team/PCCM following but  given his persistent vegetative state he is not a candidate for decannulation Continue inhalers.  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  Hypovolemic hyponatremia: Resolved.    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  Nutrition: Status post PEG tube placement continue tube feedings and free water flushes.  Left upper lung nodule: Will need a follow-up CT in 3 months.  Hyperlipidemia: Continue Lipitor.   Goals of care: PVS-guarded prognosis -palliative care was consulted sister adamant about continuing full scope of treatment.         Data Reviewed: Basic Metabolic Panel: Recent Labs  Lab 06/26/21 0736 06/27/21 1756 06/29/21 0810  NA 134* 134* 138  K 4.5 4.4 4.2  CL 97* 97* 102  CO2 25 27 26   GLUCOSE 134* 111* 130*  BUN 29* 25* 14  CREATININE 0.82 0.70 0.58*  CALCIUM 10.1 9.7 9.5  MG  --  2.1  --    Liver Function Tests: Recent Labs  Lab 06/26/21 0730  AST 32  ALT 108*  ALKPHOS 71  BILITOT 0.6  PROT 7.6  ALBUMIN 3.7   No results for input(s): LIPASE, AMYLASE in the last 168 hours. No results for input(s): AMMONIA in the  last 168 hours. CBC: Recent Labs  Lab 06/26/21 0736 06/27/21 0803 06/29/21 0810 06/30/21 0210  WBC 11.0* 9.9 8.9 11.5*  NEUTROABS 6.0  --  5.2  --   HGB 13.4 12.5* 12.7* 12.8*  HCT 40.7 38.2* 39.1 40.2  MCV 86.6 87.8 88.1 89.1  PLT 514* 447* 444* 437*    BNP (last 3 results) Recent Labs    04/29/21 2048 06/09/21 0350  BNP 626.7* 406.5*    CBG: Recent Labs  Lab 07/01/21 1125 07/01/21 1601 07/01/21 1904 07/01/21 2342 07/02/21 0344  GLUCAP 142* 81 111* 135* 107*    Recent Results (from the past 240 hour(s))  Culture, blood (routine x 2)     Status: None   Collection Time: 06/26/21 10:27 AM   Specimen: BLOOD  Result Value Ref Range Status   Specimen Description BLOOD RIGHT ANTECUBITAL  Final   Special Requests   Final    BOTTLES DRAWN AEROBIC AND ANAEROBIC Blood Culture adequate volume   Culture   Final    NO GROWTH 5 DAYS Performed at [facility] Lab, [address], [place], [place]    Report Status 07/01/2021 FINAL  Final  Culture, blood (routine x 2)     Status: None   Collection Time: 06/26/21 10:27 AM  Specimen: BLOOD LEFT HAND  Result Value Ref Range Status   Specimen Description BLOOD LEFT HAND  Final   Special Requests   Final    BOTTLES DRAWN AEROBIC AND ANAEROBIC Blood Culture adequate volume   Culture   Final    NO GROWTH 5 DAYS Performed at [facility], [address], [place], [place] [address]    Report Status 07/01/2021 FINAL  Final  Urine Culture     Status: Abnormal   Collection Time: 06/26/21  1:42 PM   Specimen: Urine, Catheterized  Result Value Ref Range Status   Specimen Description URINE, CATHETERIZED  Final   Special Requests   Final    NONE Performed at [facility] Lab, [address] [place], [place] [address]    Culture (A)  Final    >=100,000 COLONIES/mL KLEBSIELLA PNEUMONIAE Confirmed Extended Spectrum Beta-Lactamase Producer (ESBL).  In bloodstream infections from ESBL organisms, carbapenems are  preferred over piperacillin/tazobactam. They are shown to have a lower risk of mortality.    Report Status 06/28/2021 FINAL  Final   Organism ID, Bacteria KLEBSIELLA PNEUMONIAE (A)  Final      Susceptibility   Klebsiella pneumoniae - MIC*    AMPICILLIN >=32 RESISTANT Resistant     CEFAZOLIN >=64 RESISTANT Resistant     CEFEPIME >=32 RESISTANT Resistant     CEFTRIAXONE >=64 RESISTANT Resistant     CIPROFLOXACIN >=4 RESISTANT Resistant     GENTAMICIN >=16 RESISTANT Resistant     IMIPENEM <=0.25 SENSITIVE Sensitive     NITROFURANTOIN 128 RESISTANT Resistant     TRIMETH/SULFA >=320 RESISTANT Resistant     AMPICILLIN/SULBACTAM >=32 RESISTANT Resistant     PIP/TAZO 16 SENSITIVE Sensitive     * >=100,000 COLONIES/mL KLEBSIELLA PNEUMONIAE      Scheduled Meds:  arformoterol  15 mcg Nebulization BID   atorvastatin  40 mg Per Tube QHS   budesonide (PULMICORT) nebulizer solution  0.5 mg Nebulization BID   chlorhexidine  15 mL Mouth Rinse BID   clonazePAM  0.5 mg Per Tube BID   docusate  200 mg Per Tube BID   feeding supplement (PROSource TF)  45 mL Per Tube TID   fiber  1 packet Per Tube BID   free water  200 mL Per Tube Q6H   heparin  5,000 Units Subcutaneous Q8H   ibuprofen  400 mg Oral Q8H   levETIRAcetam  1,000 mg Per Tube BID   mouth rinse  15 mL Mouth Rinse q12n4p   metoprolol tartrate  75 mg Per Tube BID   pantoprazole sodium  40 mg Per Tube QHS   polyethylene glycol  17 g Per Tube BID   polyvinyl alcohol  2 drop Both Eyes Q6H   revefenacin  175 mcg Nebulization Daily   senna  1 tablet Per Tube BID   Continuous Infusions:  sodium chloride 250 mL (06/15/21 1719)   feeding supplement (JEVITY 1.5 CAL/FIBER) 1,000 mL (07/01/21 2223)    Active Problems:   Cardiac arrest (HCC)   Acute respiratory failure with hypoxia (HCC)   Seizure-like activity (HCC)   Anoxic encephalopathy (HCC)   Tracheostomy in place [facility])   Goals of care, counseling/discussion   AKI (acute kidney  injury) (HCC)   Tracheostomy dependence (HCC)   Persistent vegetative state (HCC)   Generalized anxiety disorder   Other chronic pain   Ogilvie syndrome   Gram-negative infection   Acute UTI   Urinary tract infection due to ESBL Klebsiella  Consultants: Neurology General surgery Interventional radiology PCCM  Procedures: 2D echocardiogram Multiple EEGs Cortrak placement PEG tube placement  Antibiotics: Azithromycin 8/2 through 8/3 Cefepime 8/2 through 8/3 Ceftriaxone 8/3 through 8/7 Vancomycin 8/2 x 1 dose Zosyn 8/18 x 1 Unasyn 8/19 through 8/21 Cefazolin 8/24 through 8/24 Fluconazole 8/25 through 9/3 Zosyn 8/25 x 1 dose Cefepime 9/1 x 1 dose Vancomycin 9/1 x 1 dose Azithromycin 9/8 through 9/12 Zosyn 9/12 x 1 dose Meropenem 9/13 through/20   Time spent: 25 minutes    Junious Silk ANP  [facility] 7 am - 330 pm/M-F for direct patient care and secure chat Please refer to Amion for contact info 64  days

## 2021-07-02 NOTE — Progress Notes (Signed)
Nutrition Follow-up  DOCUMENTATION CODES:  Not applicable  INTERVENTION:  Continue TF via PEG: -Jevity 1.5 at 60 ml/h (1440 ml/d) -20m Prosource TF TID -Free water flushes 200 ml every 6 hours  Provides 2280 kcal, 125 gm protein, 1094 ml free water daily (1894 ml total with flushes)  Please obtain new measured weight and check weights weekly   NUTRITION DIAGNOSIS:  Inadequate oral intake related to inability to eat as evidenced by NPO status. -- ongoing   GOAL:  Patient will meet greater than or equal to 90% of their needs -- met with TF  MONITOR:  Labs, Weight trends, TF tolerance, Skin, I & O's  REASON FOR ASSESSMENT:  Ventilator, Consult Enteral/tube feeding initiation and management  ASSESSMENT:  50yo male admitted S/P PEA cardiac arrest at home. PMH includes tobacco abuse, emphysema, CVA, diabetes.  Pt awake/alert and able to track RD during visit. Pt remains nonverbal/in persistent vegetative state. PMT following; pt's sister would like pt to continue receiving full scope of treatment. Pt continues to receive TF via PEG. Tolerating well per RN. Current TF orders: Jevity 1.5 at 60 ml/h with Prosource TF 45 ml TID and 2015mfree water Q6H.  Medications: Nutrisource fiber BID per tube, Colace, Protonix, Miralax, Senokot, Keppra Labs: Na 134 (L), Cr 0.6 (L) CBGs: 81-135 x 24 hours  Last weight 63.2 kg on 9/11 Admission weight 70 kg on 8/2 Pt needs new weight measured and weights updated on at least a weekly basis  UOP: [id number] hours  Diet Order:   Diet Order     None      EDUCATION NEEDS:  Not appropriate for education at this time  Skin:  Skin Assessment: Reviewed RN Assessment  Last BM:  10/5  Height:  Ht Readings from Last 1 Encounters:  05/26/21 _0  (1.727 m)   Weight:  Wt Readings from Last 1 Encounters:  06/08/21 63.2 kg   Ideal Body Weight:  70 kg  BMI:  Body mass index is 21.2 kg/m.  Estimated Nutritional Needs:  Kcal:   2000-2200 Protein:  110-125 gm Fluid:  >/= 2 L    AmaLarkin InaS, RD, LDN (she/her/hers) RD pager number and weekend/on-call pager number located in [place]

## 2021-07-03 DIAGNOSIS — I469 Cardiac arrest, cause unspecified: Secondary | ICD-10-CM | POA: Diagnosis not present

## 2021-07-03 DIAGNOSIS — Z930 Tracheostomy status: Secondary | ICD-10-CM | POA: Diagnosis not present

## 2021-07-03 DIAGNOSIS — R403 Persistent vegetative state: Secondary | ICD-10-CM | POA: Diagnosis not present

## 2021-07-03 DIAGNOSIS — K5981 Ogilvie syndrome: Secondary | ICD-10-CM | POA: Diagnosis not present

## 2021-07-03 LAB — GLUCOSE, CAPILLARY
Glucose-Capillary: 147 mg/dL — ABNORMAL HIGH (ref 70–99)
Glucose-Capillary: 152 mg/dL — ABNORMAL HIGH (ref 70–99)
Glucose-Capillary: 107 mg/dL — ABNORMAL HIGH (ref 70–99)
Glucose-Capillary: 144 mg/dL — ABNORMAL HIGH (ref 70–99)
Glucose-Capillary: 118 mg/dL — ABNORMAL HIGH (ref 70–99)

## 2021-07-03 NOTE — Plan of Care (Signed)
  Problem: Clinical Measurements: Goal: Diagnostic test results will improve Outcome: Progressing Goal: Respiratory complications will improve Outcome: Progressing Goal: Cardiovascular complication will be avoided Outcome: Progressing   

## 2021-07-03 NOTE — Progress Notes (Addendum)
[facility] PROGRESS NOTE  Vincent [name] GLO:[id number] DOB: 1970/12/10 DOA: 04/29/2021 PCP: Pcp, No  Status: Remains inpatient appropriate because:Altered mental status and Unsafe d/c plan  Dispo: The patient is from: Home              Anticipated d/c is to: SNF-trach capable              Patient currently is medically stable to d/c.  Difficult to place patient Yes               Barriers to discharge: Medicaid funding- permanent trach  Level of care: Progressive   Code Status: DNR Family Communication: Sister Vincent [name] on 10/5.  She is hopeful that he will get a bed at [facility] since that is only 45 minutes from her home DVT prophylaxis: Subcutaneous heparin COVID vaccination status: Unknown  HPI:  50 y.o. 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  He has had repetitive blinking concerning for seizure activity but EEG has been negative.  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  Since admission he has been treated for sepsis secondary to Blood culture showed Klebsiella and MSSA, he did complete a course of antibiotics.  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 Goals of care discussion held by palliative care team.  Patient's sister was adamant about continuing full scope of treatment although patient does remain a DNR   Subjective: Awake.  Unable to follow commands.  Does track persons in room.  Noted to be spontaneously moving left arm today without purpose.  Objective: Vitals:   07/03/21 0731 07/03/21 0737  BP: (!) 141/87 (!) 153/88  Pulse: (!) 115 (!) 117  Resp: 16 (!) 22  Temp:  98.4 F (36.9 C)   SpO2: 100% 100%    Intake/Output Summary (Last 24 hours) at 07/03/2021 0814 Last data filed at 07/03/2021 0407 Gross per 24 hour  Intake 950 ml  Output 1925 ml  Net -975 ml   Filed Weights   06/08/21 0353 07/03/21 0324 07/03/21 0500  Weight: 63.2 kg 64.3 kg 66.2 kg    Exam:  Constitutional: No acute distress, alert ENT: Medial sclera injected, no drainage-improved Respiratory: #6.0 cuffed Shiley trach with RA via TC, lungs are clear to auscultation, no excessive secretions.  No increased work of breathing Cardiovascular: S1-S2, no peripheral edema, persistent but stable tachycardia, extremities warm and dry Abdomen: no tenderness, soft and distended (Ogilvie syndrome), G-tube site unremarkable, LBM 10/05, normoactive bowel sounds Neurologic: CN 2-12 grossly intact Psychiatric: Alert.  Nonverbal.  Significant Events: 8/2 PEA Arrest, King airway in the field, CPR x6 minutes and epi x1, ROSC, Intubated in ED, Admitted to [facility]  8/24 PEG placed 8/4 Weaned off TTM, [place] O2 satof 47%. 8/8 Post pyloric Cortrak placed, myoclonic jerking, started Keppra, depakote 8/10 Completed antibiotic course for pneumonia. Family meeting transitioned to DNR. 8/15 Tracheostomy  8/18 Febrile overnight, started Zosyn for aspiration pneumonia 8/22 Completed Abx course for aspiration pneumonia  8/24 PEG placed 8/31 PSV wean 2 hours 9/1 Stopped lasix, farxiga, losartan, spiro with AKI. Ongoing jerking, diaphoresis. WBC increased, cultured / abx started empirically.  PCT increased. CK 2,450 9/2 start scheduled opiates  for possible opiate withdrawal symptoms 9/4 add flexeril, on PSV.  Versed x1 overnight. 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: Continue Keppra-no obvious recurrent seizures and  recent EEG negative  Acute respiratory failure with hypoxia in the setting of cardiac arrest and aspiration pneumonia: Trach team/PCCM following but given his persistent vegetative state he is not a candidate for decannulation  Hypothermia/altered mental status 2/2 ESBL Klebsiella UTI Sepsis work-up negative-completed 5 days of meropenem  Suspected anxiety/pain Possible underlying pain and anxiety that patient is unable to adequately relate given underlying PVS Continue scheduled ibuprofen, Oxy IR 2.5 mg prn and scheduled Klonopin  Injected sclera bilaterally Continue Zaditor eyedrops  Sepsis due to ESBL Klebsiella tracheobronchitis: Resolved   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  Hypovolemic hyponatremia: Resolved.    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  Nutrition: Status post PEG tube placement continue tube feedings and free water flushes.  Left upper lung nodule: Will need a follow-up CT in 3 months.  Hyperlipidemia: Continue Lipitor.   Goals of care: PVS-guarded prognosis -palliative care was consulted sister adamant about continuing full scope of treatment.         Data Reviewed: Basic Metabolic Panel: Recent Labs  Lab 06/27/21 1756 06/29/21 0810 07/02/21 0814  NA 134* 138 134*  K 4.4 4.2 4.2  CL 97* 102 100  CO2 27 26 24   GLUCOSE 111* 130* 137*  BUN 25* 14 16  CREATININE 0.70 0.58* 0.60*  CALCIUM 9.7 9.5 9.8  MG 2.1  --   --    Liver Function Tests: No results for input(s): AST, ALT, ALKPHOS, BILITOT, PROT, ALBUMIN in the last 168 hours.  No results for input(s): LIPASE, AMYLASE in the last 168 hours. No results for input(s): AMMONIA in the last 168  hours. CBC: Recent Labs  Lab 06/27/21 0803 06/29/21 0810 06/30/21 0210 07/02/21 0814  WBC 9.9 8.9 11.5* 8.9  NEUTROABS  --  5.2  --  4.3  HGB 12.5* 12.7* 12.8* 12.8*  HCT 38.2* 39.1 40.2 40.2  MCV 87.8 88.1 89.1 88.5  PLT 447* 444* 437* 383    BNP (last 3 results) Recent Labs    04/29/21 2048 06/09/21 0350  BNP 626.7* 406.5*    CBG: Recent Labs  Lab 07/02/21 1221 07/02/21 1946 07/02/21 2349 07/03/21 0356 07/03/21 0739  GLUCAP 136* 106* 145* 147* 152*    Recent Results (from the past 240 hour(s))  Culture, blood (routine x 2)     Status: None   Collection Time: 06/26/21 10:27 AM   Specimen: BLOOD  Result Value Ref Range Status   Specimen Description BLOOD RIGHT ANTECUBITAL  Final   Special Requests   Final    BOTTLES DRAWN AEROBIC AND ANAEROBIC Blood Culture adequate volume   Culture   Final    NO GROWTH 5 DAYS Performed at [facility], [address], [place], [place]    Report Status 07/01/2021 FINAL  Final  Culture, blood (routine x 2)     Status: None   Collection Time: 06/26/21 10:27 AM   Specimen: BLOOD LEFT HAND  Result Value Ref  Range Status   Specimen Description BLOOD LEFT HAND  Final   Special Requests   Final    BOTTLES DRAWN AEROBIC AND ANAEROBIC Blood Culture adequate volume   Culture   Final    NO GROWTH 5 DAYS Performed at [facility], [address] [place], [place] [address]    Report Status 07/01/2021 FINAL  Final  Urine Culture     Status: Abnormal   Collection Time: 06/26/21  1:42 PM   Specimen: Urine, Catheterized  Result Value Ref Range Status   Specimen Description URINE, CATHETERIZED  Final   Special Requests   Final    NONE Performed at [facility], [address] [place], [place] [address]    Culture (A)  Final    >=100,000 COLONIES/mL KLEBSIELLA PNEUMONIAE Confirmed Extended Spectrum Beta-Lactamase Producer (ESBL).  In bloodstream infections from ESBL organisms, carbapenems are preferred  over piperacillin/tazobactam. They are shown to have a lower risk of mortality.    Report Status 06/28/2021 FINAL  Final   Organism ID, Bacteria KLEBSIELLA PNEUMONIAE (A)  Final      Susceptibility   Klebsiella pneumoniae - MIC*    AMPICILLIN >=32 RESISTANT Resistant     CEFAZOLIN >=64 RESISTANT Resistant     CEFEPIME >=32 RESISTANT Resistant     CEFTRIAXONE >=64 RESISTANT Resistant     CIPROFLOXACIN >=4 RESISTANT Resistant     GENTAMICIN >=16 RESISTANT Resistant     IMIPENEM <=0.25 SENSITIVE Sensitive     NITROFURANTOIN 128 RESISTANT Resistant     TRIMETH/SULFA >=320 RESISTANT Resistant     AMPICILLIN/SULBACTAM >=32 RESISTANT Resistant     PIP/TAZO 16 SENSITIVE Sensitive     * >=100,000 COLONIES/mL KLEBSIELLA PNEUMONIAE      Scheduled Meds:  arformoterol  15 mcg Nebulization BID   atorvastatin  40 mg Per Tube QHS   budesonide (PULMICORT) nebulizer solution  0.5 mg Nebulization BID   chlorhexidine  15 mL Mouth Rinse BID   clonazePAM  0.5 mg Per Tube BID   docusate  200 mg Per Tube BID   feeding supplement (PROSource TF)  45 mL Per Tube TID   fiber  1 packet Per Tube BID   free water  200 mL Per Tube Q6H   heparin  5,000 Units Subcutaneous Q8H   ketotifen  1 drop Both Eyes BID   levETIRAcetam  1,000 mg Per Tube BID   mouth rinse  15 mL Mouth Rinse q12n4p   metoprolol tartrate  75 mg Per Tube BID   pantoprazole sodium  40 mg Per Tube QHS   polyethylene glycol  17 g Per Tube BID   polyvinyl alcohol  2 drop Both Eyes Q6H   revefenacin  175 mcg Nebulization Daily   senna  1 tablet Per Tube BID   Continuous Infusions:  sodium chloride 250 mL (06/15/21 1719)   feeding supplement (JEVITY 1.5 CAL/FIBER) 1,000 mL (07/03/21 0720)    Active Problems:   Cardiac arrest (HCC)   Acute respiratory failure with hypoxia (HCC)   Seizure-like activity (HCC)   Anoxic encephalopathy (HCC)   Tracheostomy in place [facility])   Goals of care, counseling/discussion   AKI (acute kidney injury)  (HCC)   Tracheostomy dependence (HCC)   Persistent vegetative state (HCC)   Generalized anxiety disorder   Other chronic pain   Ogilvie syndrome   Gram-negative infection   Acute UTI   Urinary tract infection due to ESBL Klebsiella   Consultants: Neurology General surgery Interventional radiology  PCCM  Procedures: 2D echocardiogram Multiple EEGs Cortrak placement PEG tube placement  Antibiotics: Azithromycin 8/2 through 8/3 Cefepime 8/2 through 8/3 Ceftriaxone 8/3 through 8/7 Vancomycin 8/2 x 1 dose Zosyn 8/18 x 1 Unasyn 8/19 through 8/21 Cefazolin 8/24 through 8/24 Fluconazole 8/25 through 9/3 Zosyn 8/25 x 1 dose Cefepime 9/1 x 1 dose Vancomycin 9/1 x 1 dose Azithromycin 9/8 through 9/12 Zosyn 9/12 x 1 dose Meropenem 9/13 through/20   Time spent: 25 minutes    Junious Silk ANP  [facility] 7 am - 330 pm/M-F for direct patient care and secure chat Please refer to Amion for contact info 65  days

## 2021-07-04 DIAGNOSIS — G931 Anoxic brain damage, not elsewhere classified: Secondary | ICD-10-CM | POA: Diagnosis not present

## 2021-07-04 DIAGNOSIS — R403 Persistent vegetative state: Secondary | ICD-10-CM | POA: Diagnosis not present

## 2021-07-04 DIAGNOSIS — R569 Unspecified convulsions: Secondary | ICD-10-CM | POA: Diagnosis not present

## 2021-07-04 DIAGNOSIS — I469 Cardiac arrest, cause unspecified: Secondary | ICD-10-CM | POA: Diagnosis not present

## 2021-07-04 LAB — GLUCOSE, CAPILLARY
Glucose-Capillary: 104 mg/dL — ABNORMAL HIGH (ref 70–99)
Glucose-Capillary: 127 mg/dL — ABNORMAL HIGH (ref 70–99)
Glucose-Capillary: 140 mg/dL — ABNORMAL HIGH (ref 70–99)
Glucose-Capillary: 141 mg/dL — ABNORMAL HIGH (ref 70–99)
Glucose-Capillary: 141 mg/dL — ABNORMAL HIGH (ref 70–99)
Glucose-Capillary: 141 mg/dL — ABNORMAL HIGH (ref 70–99)
Glucose-Capillary: 180 mg/dL — ABNORMAL HIGH (ref 70–99)

## 2021-07-04 NOTE — Progress Notes (Signed)
   07/04/21 1000  Assess: MEWS Score  Temp 98.3 F (36.8 C)  BP 139/68  Pulse Rate (!) 108  ECG Heart Rate (!) 108  Resp 20  Level of Consciousness Alert  SpO2 100 %  O2 Device Tracheostomy Collar  O2 Flow Rate (L/min) 5 L/min  FiO2 (%) 21 %  Assess: MEWS Score  MEWS Temp 0  MEWS Systolic 0  MEWS Pulse 1  MEWS RR 0  MEWS LOC 0  MEWS Score 1  MEWS Score Color Green  Treat  Pain Scale CPOT  Facial Expression 0  Body Movements 0  Muscle Tension 0  Compliance with ventilator (intubated pts.) N/A  Vocalization (extubated pts.) N/A  CPOT Total 0  Document  Patient Outcome Stabilized after interventions  Progress note created (see row info) Yes   MEWS protocol discontinued after discussion with Dr. Jerral Ralph and Charge RN Nonnie Done.  Patient is stable after interventions.

## 2021-07-04 NOTE — Progress Notes (Addendum)
Patient intermittently has MEWS scores that are Yellow based on heart rate. This RN discussed with Dr. Jerral Ralph the Yellow MEWS rating, and he stated to ignore in this patient as his tachycardia remains.  Consulting civil engineer for Day Shift - Education officer, environmental, S. Excell Seltzer, RN also made aware of his MEWS and above discussion with Dr. Jerral Ralph

## 2021-07-04 NOTE — Progress Notes (Signed)
[facility] PROGRESS NOTE  Vincent [name] YQM:[id number] DOB: 1971/03/20 DOA: 04/29/2021  Patient goes by Vincent [name]   PCP: Pcp, No  Status: Remains inpatient appropriate because:Altered mental status and Unsafe d/c plan  Dispo: The patient is from: Home              Anticipated d/c is to: SNF-trach capable              Patient currently is medically stable to d/c.  Difficult to place patient Yes               Barriers to discharge: Medicaid funding- permanent trach  Level of care: Progressive   Code Status: DNR Family Communication: Sister [name] on 10/5.  She is hopeful that he will get a bed at [facility] since that is only 45 minutes from her home DVT prophylaxis: Subcutaneous heparin COVID vaccination status: Unknown  HPI:  50 y.o. 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  He has had repetitive blinking concerning for seizure activity but EEG has been negative.  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  Since admission he has been treated for sepsis secondary to Blood culture showed Klebsiella and MSSA, he did complete a course of antibiotics.  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 Goals of care discussion held by palliative care team.  Patient's sister was adamant about continuing full scope of treatment although patient does remain a DNR   Subjective: Awake.  Continues to track persons in the room.  Right arm up to chin and mouth.  During abdominal exam patient was able to move hips spontaneously.  Objective: Vitals:   07/04/21 0935 07/04/21 1000  BP: (!) 127/45 139/68  Pulse: (!) 110 (!) 108   Resp: 18 20  Temp:  98.3 F (36.8 C)  SpO2: 100% 100%    Intake/Output Summary (Last 24 hours) at 07/04/2021 1045 Last data filed at 07/03/2021 2242 Gross per 24 hour  Intake 1460 ml  Output 1200 ml  Net 260 ml   Filed Weights   07/03/21 0324 07/03/21 0500 07/04/21 0459  Weight: 64.3 kg 66.2 kg 66.4 kg    Exam:  Constitutional: No acute distress, alert Respiratory: #6.0 cuffed Shiley trach with RA via TC, anterior lung sounds are clear to auscultation, no increased work of breathing, pulse oximetry 100% Cardiovascular: S1-S2, no peripheral edema, persistent but stable tachycardia, extremities warm and dry Abdomen: no tenderness, soft and distended (Ogilvie syndrome), G-tube site unremarkable, LBM 10/06, normoactive bowel sounds Neurologic: CN 2-12 grossly intact Psychiatric: Alert.  Nonverbal.  Significant Events: 8/2 PEA Arrest, King airway in the field, CPR x6 minutes and epi x1, ROSC, Intubated in ED, Admitted to [facility]  8/24 PEG placed 8/4 Weaned off TTM, Coox O2 satof 47%. 8/8 Post pyloric Cortrak placed, myoclonic jerking, started Keppra, depakote 8/10 Completed antibiotic course for pneumonia. Family meeting transitioned to DNR. 8/15 Tracheostomy  8/18 Febrile overnight, started Zosyn for aspiration pneumonia 8/22 Completed Abx course for aspiration pneumonia  8/24 PEG placed 8/31 PSV wean 2 hours 9/1 Stopped lasix, farxiga, losartan, spiro with AKI. Ongoing jerking, diaphoresis. WBC increased, cultured / abx started empirically.  PCT increased. CK  2,450 9/2 start scheduled opiates for possible opiate withdrawal symptoms 9/4 add flexeril, on PSV.  Versed x1 overnight. 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: Continue Keppra-no obvious recurrent seizures and  recent EEG negative  Acute respiratory failure with hypoxia in the setting of cardiac arrest and aspiration pneumonia: Trach team/PCCM following but given his persistent vegetative state he is not a candidate for decannulation  Hypothermia/altered mental status 2/2 ESBL Klebsiella UTI Sepsis work-up negative-completed 5 days of meropenem  Suspected anxiety/pain Continue scheduled ibuprofen, Oxy IR 2.5 mg prn and scheduled Klonopin  Injected sclera bilaterally Continue Zaditor eyedrops  Sepsis due to ESBL Klebsiella tracheobronchitis: Resolved   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  Hypovolemic hyponatremia: Resolved.    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  Nutrition: Status post PEG tube placement continue tube feedings and free water flushes.  Left upper lung nodule: Will need a follow-up CT in 3 months.  Hyperlipidemia: Continue Lipitor.   Goals of care: PVS-guarded prognosis -palliative care was consulted sister adamant about continuing full scope of treatment.         Data Reviewed: Basic Metabolic Panel: Recent Labs  Lab 06/27/21 1756 06/29/21 0810 07/02/21 0814  NA 134* 138 134*  K 4.4 4.2 4.2  CL 97* 102 100  CO2 27 26 24   GLUCOSE 111* 130* 137*  BUN 25* 14 16  CREATININE 0.70 0.58* 0.60*  CALCIUM 9.7 9.5 9.8  MG 2.1  --   --    Liver Function Tests: No results for input(s): AST, ALT, ALKPHOS, BILITOT, PROT, ALBUMIN in the last 168 hours.  No results for input(s): LIPASE, AMYLASE in the last 168 hours. No results for input(s): AMMONIA in the last 168 hours. CBC: Recent Labs  Lab 06/29/21 0810 06/30/21 0210 07/02/21 0814  WBC 8.9 11.5* 8.9   NEUTROABS 5.2  --  4.3  HGB 12.7* 12.8* 12.8*  HCT 39.1 40.2 40.2  MCV 88.1 89.1 88.5  PLT 444* 437* 383    BNP (last 3 results) Recent Labs    04/29/21 2048 06/09/21 0350  BNP 626.7* 406.5*    CBG: Recent Labs  Lab 07/03/21 1641 07/03/21 2059 07/04/21 0026 07/04/21 0548 07/04/21 0830  GLUCAP 144* 118* 127* 141* 141*    Recent Results (from the past 240 hour(s))  Culture, blood (routine x 2)     Status: None   Collection Time: 06/26/21 10:27 AM   Specimen: BLOOD  Result Value Ref Range Status   Specimen Description BLOOD RIGHT ANTECUBITAL  Final   Special Requests   Final    BOTTLES DRAWN AEROBIC AND ANAEROBIC Blood Culture adequate volume   Culture   Final    NO GROWTH 5 DAYS Performed at [facility], [address] [place], [place]    Report Status 07/01/2021 FINAL  Final  Culture, blood (routine x 2)     Status: None   Collection Time: 06/26/21 10:27 AM   Specimen: BLOOD LEFT HAND  Result Value Ref Range Status   Specimen Description BLOOD LEFT HAND  Final   Special Requests   Final  BOTTLES DRAWN AEROBIC AND ANAEROBIC Blood Culture adequate volume   Culture   Final    NO GROWTH 5 DAYS Performed at [facility], [address], [place], [place] [address]    Report Status 07/01/2021 FINAL  Final  Urine Culture     Status: Abnormal   Collection Time: 06/26/21  1:42 PM   Specimen: Urine, Catheterized  Result Value Ref Range Status   Specimen Description URINE, CATHETERIZED  Final   Special Requests   Final    NONE Performed at [facility], [address] [place], [place] [address]    Culture (A)  Final    >=100,000 COLONIES/mL KLEBSIELLA PNEUMONIAE Confirmed Extended Spectrum Beta-Lactamase Producer (ESBL).  In bloodstream infections from ESBL organisms, carbapenems are preferred over piperacillin/tazobactam. They are shown to have a lower risk of mortality.    Report Status 06/28/2021 FINAL  Final   Organism ID,  Bacteria KLEBSIELLA PNEUMONIAE (A)  Final      Susceptibility   Klebsiella pneumoniae - MIC*    AMPICILLIN >=32 RESISTANT Resistant     CEFAZOLIN >=64 RESISTANT Resistant     CEFEPIME >=32 RESISTANT Resistant     CEFTRIAXONE >=64 RESISTANT Resistant     CIPROFLOXACIN >=4 RESISTANT Resistant     GENTAMICIN >=16 RESISTANT Resistant     IMIPENEM <=0.25 SENSITIVE Sensitive     NITROFURANTOIN 128 RESISTANT Resistant     TRIMETH/SULFA >=320 RESISTANT Resistant     AMPICILLIN/SULBACTAM >=32 RESISTANT Resistant     PIP/TAZO 16 SENSITIVE Sensitive     * >=100,000 COLONIES/mL KLEBSIELLA PNEUMONIAE      Scheduled Meds:  arformoterol  15 mcg Nebulization BID   atorvastatin  40 mg Per Tube QHS   budesonide (PULMICORT) nebulizer solution  0.5 mg Nebulization BID   chlorhexidine  15 mL Mouth Rinse BID   clonazePAM  0.5 mg Per Tube BID   docusate  200 mg Per Tube BID   feeding supplement (PROSource TF)  45 mL Per Tube TID   fiber  1 packet Per Tube BID   free water  200 mL Per Tube Q6H   heparin  5,000 Units Subcutaneous Q8H   ketotifen  1 drop Both Eyes BID   levETIRAcetam  1,000 mg Per Tube BID   mouth rinse  15 mL Mouth Rinse q12n4p   metoprolol tartrate  75 mg Per Tube BID   pantoprazole sodium  40 mg Per Tube QHS   polyethylene glycol  17 g Per Tube BID   polyvinyl alcohol  2 drop Both Eyes Q6H   revefenacin  175 mcg Nebulization Daily   senna  1 tablet Per Tube BID   Continuous Infusions:  sodium chloride 250 mL (06/15/21 1719)   feeding supplement (JEVITY 1.5 CAL/FIBER) 1,000 mL (07/03/21 0720)    Active Problems:   Cardiac arrest (HCC)   Acute respiratory failure with hypoxia (HCC)   Seizure-like activity (HCC)   Anoxic encephalopathy (HCC)   Tracheostomy in place [facility])   Goals of care, counseling/discussion   AKI (acute kidney injury) (HCC)   Tracheostomy dependence (HCC)   Persistent vegetative state (HCC)   Generalized anxiety disorder   Other chronic pain   Ogilvie  syndrome   Gram-negative infection   Acute UTI   Urinary tract infection due to ESBL Klebsiella   Consultants: Neurology General surgery Interventional radiology PCCM  Procedures: 2D echocardiogram Multiple EEGs Cortrak placement PEG tube placement  Antibiotics: Azithromycin 8/2 through 8/3 Cefepime 8/2 through  8/3 Ceftriaxone 8/3 through 8/7 Vancomycin 8/2 x 1 dose Zosyn 8/18 x 1 Unasyn 8/19 through 8/21 Cefazolin 8/24 through 8/24 Fluconazole 8/25 through 9/3 Zosyn 8/25 x 1 dose Cefepime 9/1 x 1 dose Vancomycin 9/1 x 1 dose Azithromycin 9/8 through 9/12 Zosyn 9/12 x 1 dose Meropenem 9/13 through/20   Time spent: 25 minutes    Junious Silk ANP  [facility] 7 am - 330 pm/M-F for direct patient care and secure chat Please refer to Amion for contact info 66  days

## 2021-07-04 NOTE — Progress Notes (Signed)
   07/04/21 0821  Assess: MEWS Score  Temp 98.6 F (37 C)  BP (!) 149/81  Pulse Rate (!) 117  ECG Heart Rate (!) 117  Resp 20  Level of Consciousness Alert  SpO2 100 %  O2 Device Tracheostomy Collar  O2 Flow Rate (L/min) 5 L/min  FiO2 (%) 21 %  Assess: MEWS Score  MEWS Temp 0  MEWS Systolic 0  MEWS Pulse 2  MEWS RR 0  MEWS LOC 0  MEWS Score 2  MEWS Score Color Yellow  Assess: if the MEWS score is Yellow or Red  Were vital signs taken at a resting state? Yes  Focused Assessment No change from prior assessment  Early Detection of Sepsis Score *See Row Information* Low  MEWS guidelines implemented *See Row Information* Yes  Treat  MEWS Interventions Administered prn meds/treatments  Pain Scale CPOT  Facial Expression 1  Body Movements 2  Muscle Tension 2  Compliance with ventilator (intubated pts.) N/A  Vocalization (extubated pts.) N/A  CPOT Total 5  Take Vital Signs  Increase Vital Sign Frequency  Yellow: Q 2hr X 2 then Q 4hr X 2, if remains yellow, continue Q 4hrs  Escalate  MEWS: Escalate Yellow: discuss with charge nurse/RN and consider discussing with provider and RRT  Notify: Charge Nurse/RN  Name of Charge Nurse/RN Notified Reaonna RN  Date Charge Nurse/RN Notified 07/04/21  Time Charge Nurse/RN Notified [phone]  Notify: Provider  Provider Name/Title Dr. Jerral Ralph  Date Provider Notified 07/04/21  Time Provider Notified [phone]  Notification Type Page  Notification Reason Change in status (Yellow MEWS)  Provider response No new orders  Date of Provider Response 07/04/21  Time of Provider Response [phone]

## 2021-07-05 DIAGNOSIS — I469 Cardiac arrest, cause unspecified: Secondary | ICD-10-CM | POA: Diagnosis not present

## 2021-07-05 LAB — CBC WITH DIFFERENTIAL/PLATELET
Abs Immature Granulocytes: 0.11 10*3/uL — ABNORMAL HIGH (ref 0.00–0.07)
Basophils Absolute: 0.1 10*3/uL (ref 0.0–0.1)
Basophils Relative: 1 %
Eosinophils Absolute: 0.2 10*3/uL (ref 0.0–0.5)
Eosinophils Relative: 3 %
HCT: 41 % (ref 39.0–52.0)
Hemoglobin: 13.6 g/dL (ref 13.0–17.0)
Immature Granulocytes: 1 %
Lymphocytes Relative: 32 %
Lymphs Abs: 3 10*3/uL (ref 0.7–4.0)
MCH: 28.9 pg (ref 26.0–34.0)
MCHC: 33.2 g/dL (ref 30.0–36.0)
MCV: 87 fL (ref 80.0–100.0)
Monocytes Absolute: 1.2 10*3/uL — ABNORMAL HIGH (ref 0.1–1.0)
Monocytes Relative: 13 %
Neutro Abs: 4.8 10*3/uL (ref 1.7–7.7)
Neutrophils Relative %: 50 %
Platelets: 401 10*3/uL — ABNORMAL HIGH (ref 150–400)
RBC: 4.71 MIL/uL (ref 4.22–5.81)
RDW: 15.7 % — ABNORMAL HIGH (ref 11.5–15.5)
WBC: 9.4 10*3/uL (ref 4.0–10.5)
nRBC: 0 % (ref 0.0–0.2)

## 2021-07-05 LAB — BASIC METABOLIC PANEL
Anion gap: 10 (ref 5–15)
BUN: 20 mg/dL (ref 6–20)
CO2: 24 mmol/L (ref 22–32)
Calcium: 10.2 mg/dL (ref 8.9–10.3)
Chloride: 98 mmol/L (ref 98–111)
Creatinine, Ser: 0.6 mg/dL — ABNORMAL LOW (ref 0.61–1.24)
GFR, Estimated: >60 mL/min (ref >60)
Glucose, Bld: 113 mg/dL — ABNORMAL HIGH (ref 70–99)
Potassium: 5 mmol/L (ref 3.5–5.1)
Sodium: 132 mmol/L — ABNORMAL LOW (ref 135–145)

## 2021-07-05 LAB — GLUCOSE, CAPILLARY
Glucose-Capillary: 115 mg/dL — ABNORMAL HIGH (ref 70–99)
Glucose-Capillary: 117 mg/dL — ABNORMAL HIGH (ref 70–99)
Glucose-Capillary: 123 mg/dL — ABNORMAL HIGH (ref 70–99)
Glucose-Capillary: 145 mg/dL — ABNORMAL HIGH (ref 70–99)
Glucose-Capillary: 146 mg/dL — ABNORMAL HIGH (ref 70–99)

## 2021-07-05 MED ORDER — METOPROLOL TARTRATE 100 MG PO TABS
100.0000 mg | ORAL_TABLET | Freq: Two times a day (BID) | ORAL | Status: DC
  Administered 2021-07-05 – 2021-07-22 (×35): 100 mg
  Filled 2021-07-05 (×35): qty 1

## 2021-07-05 MED ORDER — LACTATED RINGERS IV SOLN: INTRAVENOUS | Status: AC

## 2021-07-05 NOTE — Progress Notes (Signed)
[facility] PROGRESS NOTE  Boruch Johnjoseph Rolfe LNL:[id number] DOB: April 26, 1971 DOA: 04/29/2021  Patient goes by Vincent [name]   PCP: Pcp, No  Status: Remains inpatient appropriate because:Altered mental status and Unsafe d/c plan  Dispo: The patient is from: Home              Anticipated d/c is to: SNF-trach capable              Patient currently is medically stable to d/c.  Difficult to place patient Yes               Barriers to discharge: Medicaid funding- permanent trach  Level of care: Progressive   Code Status: DNR Family Communication: Sister [name] on 10/5.  She is hopeful that he will get a bed at [facility] since that is only 45 minutes from her home DVT prophylaxis: Subcutaneous heparin COVID vaccination status: Unknown  HPI:  50 y.o. 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  He has had repetitive blinking concerning for seizure activity but EEG has been negative.  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  Since admission he has been treated for sepsis secondary to Blood culture showed Klebsiella and MSSA, he did complete a course of antibiotics.  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 Goals of care discussion held by palliative care team.  Patient's sister was adamant about continuing full scope of treatment although patient does remain a DNR   Subjective: Awake, but unable to answer questions or follow commands.  Objective: Vitals:   07/05/21 0800 07/05/21 0907  BP: 108/77   Pulse: (!) 101 (!) 106  Resp: 17 (!) 23  Temp:  98.7 F (37.1 C)  SpO2: 100% 100%    Intake/Output Summary (Last 24  hours) at 07/05/2021 1218 Last data filed at 07/04/2021 1255 Gross per 24 hour  Intake 1020 ml  Output --  Net 1020 ml   Filed Weights   07/03/21 0500 07/04/21 0459 07/05/21 0247  Weight: 66.2 kg 66.4 kg 66.4 kg    Exam:  Awake but unable to respond or follow commands, #6.0 cuffed Shiley trach with RA via TC, PEG in place. Chance.AT,PERRAL Supple Neck,  Symmetrical Chest wall movement, Good air movement bilaterally, CTAB RRR,No Gallops, Rubs or new Murmurs, No Parasternal Heave +ve B.Sounds, Abd Soft, No tenderness,   No Cyanosis, Clubbing or edema,     Significant Events: 8/2 PEA Arrest, King airway in the field, CPR x6 minutes and epi x1, ROSC, Intubated in ED, Admitted to [facility]  8/24 PEG placed 8/4 Weaned off TTM, Coox O2 satof 47%. 8/8 Post pyloric Cortrak placed, myoclonic jerking, started Keppra, depakote 8/10 Completed antibiotic course for pneumonia. Family meeting transitioned to DNR. 8/15 Tracheostomy  8/18 Febrile overnight, started Zosyn for aspiration pneumonia 8/22 Completed Abx course for aspiration pneumonia  8/24 PEG placed 8/31 PSV wean 2 hours 9/1 Stopped lasix, farxiga, losartan, spiro with AKI. Ongoing jerking, diaphoresis. WBC increased, cultured / abx started empirically.  PCT increased. CK 2,450 9/2 start scheduled opiates for possible opiate withdrawal symptoms 9/4 add flexeril, on PSV.  Versed x1 overnight. 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:  Continue Keppra-no obvious recurrent seizures and recent EEG negative  Acute respiratory failure with hypoxia in the setting of cardiac arrest and aspiration pneumonia: Trach team/PCCM following but given his persistent vegetative state he is not a candidate for decannulation  Hypothermia/altered mental status 2/2  ESBL Klebsiella UTI - Sepsis work-up negative-completed 5 days of meropenem  Suspected anxiety/pain - Continue scheduled ibuprofen, Oxy IR 2.5 mg prn and scheduled Klonopin  Injected sclera bilaterally -  Continue Zaditor eyedrops  Sepsis due to ESBL Klebsiella tracheobronchitis:  Resolved   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  Hypovolemic hyponatremia: Resolved.    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   Opiate withdrawals: Resolved.  Nutrition: Status post PEG tube placement continue tube feedings and free water flushes.  Left upper lung nodule: Will need a follow-up CT in 3 months with Pulm follow up.  Hyperlipidemia: Continue Lipitor.   Goals of care:  PVS-guarded prognosis -palliative care was consulted sister adamant about continuing full scope of treatment.  Data Reviewed:  Recent Labs  Lab 06/29/21 0810 06/30/21 0210 07/02/21 0814 07/05/21 0843  WBC 8.9 11.5* 8.9 9.4  HGB 12.7* 12.8* 12.8* 13.6  HCT 39.1 40.2 40.2 41.0  PLT 444* 437* 383 401*  MCV 88.1 89.1 88.5 87.0  MCH 28.6 28.4 28.2 28.9  MCHC 32.5 31.8 31.8 33.2  RDW 15.3 15.6* 15.9* 15.7*  LYMPHSABS 2.6  --  3.3 3.0  MONOABS 0.8  --  0.9 1.2*  EOSABS 0.2  --  0.2 0.2  BASOSABS 0.1  --  0.1 0.1    Recent Labs  Lab 06/29/21 0810 07/02/21 0814 07/05/21 0843  NA 138 134* 132*  K 4.2 4.2 5.0  CL 102 100 98  CO2 26 24 24   GLUCOSE 130* 137* 113*  BUN 14 16 20   CREATININE 0.58* 0.60* 0.60*  CALCIUM 9.5 9.8 10.2     Scheduled Meds:  arformoterol  15 mcg Nebulization BID   atorvastatin  40 mg Per Tube QHS   budesonide (PULMICORT) nebulizer solution  0.5 mg Nebulization  BID   chlorhexidine  15 mL Mouth Rinse BID   clonazePAM  0.5 mg Per Tube BID   docusate  200 mg Per Tube BID   feeding supplement (PROSource TF)  45 mL Per Tube TID   fiber  1 packet Per Tube BID   free water  200 mL Per Tube Q6H   heparin  5,000 Units Subcutaneous Q8H   ketotifen  1 drop Both Eyes BID   levETIRAcetam  1,000 mg Per Tube BID   mouth rinse  15 mL Mouth Rinse q12n4p   metoprolol tartrate  100 mg Per Tube BID   pantoprazole sodium  40 mg Per Tube QHS   polyethylene glycol  17 g Per Tube BID   polyvinyl alcohol  2 drop Both Eyes Q6H   revefenacin  175 mcg Nebulization Daily   senna  1 tablet Per Tube BID   Continuous Infusions:  sodium chloride 250 mL (06/15/21 1719)   feeding supplement (JEVITY 1.5 CAL/FIBER) 1,000 mL (07/04/21 2138)   lactated  ringers 125 mL/hr at 07/05/21 6734    Active Problems:   Cardiac arrest [facility])   Acute respiratory failure with hypoxia (HCC)   Seizure-like activity (HCC)   Anoxic encephalopathy (HCC)   Tracheostomy in place [facility])   Goals of care, counseling/discussion   AKI (acute kidney injury) (HCC)   Tracheostomy dependence (HCC)   Persistent vegetative state (HCC)   Generalized anxiety disorder   Other chronic pain   Ogilvie syndrome   Gram-negative infection   Acute UTI   Urinary tract infection due to ESBL Klebsiella   Consultants: Neurology General surgery Interventional radiology PCCM  Procedures: 2D echocardiogram Multiple EEGs Cortrak placement PEG tube placement  Antibiotics: Azithromycin 8/2 through 8/3 Cefepime 8/2 through 8/3 Ceftriaxone 8/3 through 8/7 Vancomycin 8/2 x 1 dose Zosyn 8/18 x 1 Unasyn 8/19 through 8/21 Cefazolin 8/24 through 8/24 Fluconazole 8/25 through 9/3 Zosyn 8/25 x 1 dose Cefepime 9/1 x 1 dose Vancomycin 9/1 x 1 dose Azithromycin 9/8 through 9/12 Zosyn 9/12 x 1 dose Meropenem 9/13 through/20   Time spent: 25 minutes  67  days  Signature  Susa Raring M.D on 07/05/2021  at 12:18 PM   -  To page go to [email or web address]

## 2021-07-06 ENCOUNTER — Inpatient Hospital Stay (HOSPITAL_COMMUNITY): Payer: Medicaid Other

## 2021-07-06 DIAGNOSIS — I469 Cardiac arrest, cause unspecified: Secondary | ICD-10-CM | POA: Diagnosis not present

## 2021-07-06 LAB — CBC
HCT: 41.2 % (ref 39.0–52.0)
Hemoglobin: 13.6 g/dL (ref 13.0–17.0)
MCH: 28.9 pg (ref 26.0–34.0)
MCHC: 33 g/dL (ref 30.0–36.0)
MCV: 87.7 fL (ref 80.0–100.0)
Platelets: 408 10*3/uL — ABNORMAL HIGH (ref 150–400)
RBC: 4.7 MIL/uL (ref 4.22–5.81)
RDW: 15.7 % — ABNORMAL HIGH (ref 11.5–15.5)
WBC: 10.1 10*3/uL (ref 4.0–10.5)
nRBC: 0 % (ref 0.0–0.2)

## 2021-07-06 LAB — URINALYSIS, ROUTINE W REFLEX MICROSCOPIC
Bilirubin Urine: NEGATIVE
Glucose, UA: NEGATIVE mg/dL
Hgb urine dipstick: NEGATIVE
Ketones, ur: NEGATIVE mg/dL
Leukocytes,Ua: NEGATIVE
Nitrite: NEGATIVE
Protein, ur: NEGATIVE mg/dL
Specific Gravity, Urine: 1.012 (ref 1.005–1.030)
pH: 7 (ref 5.0–8.0)

## 2021-07-06 LAB — COMPREHENSIVE METABOLIC PANEL
ALT: 108 U/L — ABNORMAL HIGH (ref 0–44)
AST: 29 U/L (ref 15–41)
Albumin: 3.5 g/dL (ref 3.5–5.0)
Alkaline Phosphatase: 61 U/L (ref 38–126)
Anion gap: 11 (ref 5–15)
BUN: 21 mg/dL — ABNORMAL HIGH (ref 6–20)
CO2: 25 mmol/L (ref 22–32)
Calcium: 10.1 mg/dL (ref 8.9–10.3)
Chloride: 96 mmol/L — ABNORMAL LOW (ref 98–111)
Creatinine, Ser: 0.65 mg/dL (ref 0.61–1.24)
GFR, Estimated: >60 mL/min (ref >60)
Glucose, Bld: 148 mg/dL — ABNORMAL HIGH (ref 70–99)
Potassium: 4.3 mmol/L (ref 3.5–5.1)
Sodium: 132 mmol/L — ABNORMAL LOW (ref 135–145)
Total Bilirubin: 0.6 mg/dL (ref 0.3–1.2)
Total Protein: 7.1 g/dL (ref 6.5–8.1)

## 2021-07-06 LAB — GLUCOSE, CAPILLARY
Glucose-Capillary: 141 mg/dL — ABNORMAL HIGH (ref 70–99)
Glucose-Capillary: 116 mg/dL — ABNORMAL HIGH (ref 70–99)
Glucose-Capillary: 174 mg/dL — ABNORMAL HIGH (ref 70–99)
Glucose-Capillary: 129 mg/dL — ABNORMAL HIGH (ref 70–99)
Glucose-Capillary: 132 mg/dL — ABNORMAL HIGH (ref 70–99)

## 2021-07-06 LAB — MAGNESIUM: Magnesium: 1.9 mg/dL (ref 1.7–2.4)

## 2021-07-06 LAB — TSH: TSH: 3.149 u[IU]/mL (ref 0.350–4.500)

## 2021-07-06 LAB — PROCALCITONIN: Procalcitonin: <0.1 ng/mL

## 2021-07-06 NOTE — Plan of Care (Signed)

## 2021-07-06 NOTE — Progress Notes (Signed)
[facility] PROGRESS NOTE  Vincent [name] IRW:[id number] DOB: 24-Jan-1971 DOA: 04/29/2021  Patient goes by Vincent [name]   PCP: Pcp, No  Status: Remains inpatient appropriate because:Altered mental status and Unsafe d/c plan  Dispo: The patient is from: Home              Anticipated d/c is to: SNF-trach capable              Patient currently is medically stable to d/c.  Difficult to place patient Yes               Barriers to discharge: Medicaid funding- permanent trach  Level of care: Progressive   Code Status: DNR Family Communication: Sister Vincent [name] on 10/5.  She is hopeful that he will get a bed at [facility] since that is only 45 minutes from her home DVT prophylaxis: Subcutaneous heparin COVID vaccination status: Unknown  HPI:  50 y.o. 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  He has had repetitive blinking concerning for seizure activity but EEG has been negative.  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  Since admission he has been treated for sepsis secondary to Blood culture showed Klebsiella and MSSA, he did complete a course of antibiotics.  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 Goals of care discussion held by palliative care team.  Patient's sister was adamant about continuing full scope of treatment although patient does remain a DNR   Subjective: Awake, will track people today, unable to answer or follow commands.  Objective: Vitals:   07/06/21 0837 07/06/21 0855  BP: 120/87 120/87  Pulse:  (!) 106  Resp:  (!) 22  Temp:    SpO2: 99% 100%    Intake/Output Summary (Last 24 hours) at  07/06/2021 0956 Last data filed at 07/06/2021 0601 Gross per 24 hour  Intake 60 ml  Output 1350 ml  Net -1290 ml   Filed Weights   07/03/21 0500 07/04/21 0459 07/05/21 0247  Weight: 66.2 kg 66.4 kg 66.4 kg    Exam:  Awake but unable to respond or follow commands, #6.0 cuffed Shiley trach with RA via TC, PEG in place. Vincent [name].AT,  Supple Neck,  Symmetrical Chest wall movement, Good air movement bilaterally,   RRR,No Gallops, Rubs or new Murmurs,   +ve B.Sounds, Abd Soft,   No Cyanosis, Clubbing or edema,      Significant Events: 8/2 PEA Arrest, King airway in the field, CPR x6 minutes and epi x1, ROSC, Intubated in ED, Admitted to [facility]  8/24 PEG placed 8/4 Weaned off TTM, Coox O2 satof 47%. 8/8 Post pyloric Cortrak placed, myoclonic jerking, started Keppra, depakote 8/10 Completed antibiotic course for pneumonia. Family meeting transitioned to DNR. 8/15 Tracheostomy  8/18 Febrile overnight, started Zosyn for aspiration pneumonia 8/22 Completed Abx course for aspiration pneumonia  8/24 PEG placed 8/31 PSV wean 2 hours 9/1 Stopped lasix, farxiga, losartan, spiro with AKI. Ongoing jerking, diaphoresis. WBC increased, cultured / abx started empirically.  PCT increased. CK 2,450 9/2 start scheduled opiates for possible opiate withdrawal symptoms 9/4 add flexeril, on PSV.  Versed x1 overnight. 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:  Continue Keppra-no obvious recurrent seizures and recent EEG negative  Acute respiratory failure with hypoxia in the setting of cardiac arrest and aspiration pneumonia: Trach team/PCCM following but given his persistent vegetative state he is not a candidate for decannulation  Hypothermia/altered mental status 2/2 ESBL Klebsiella UTI - Sepsis work-up  negative-completed 5 days of meropenem  Suspected anxiety/pain - Continue scheduled ibuprofen, Oxy IR 2.5 mg prn and scheduled Klonopin  Injected sclera bilaterally -  Continue Zaditor eyedrops  Sepsis due to ESBL Klebsiella tracheobronchitis:  Resolved   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  Hypovolemic hyponatremia: Resolved.    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   Opiate withdrawals: Resolved.  Nutrition: Status post PEG tube placement continue tube feedings and free water flushes.  Left upper lung nodule: Will need a follow-up CT in 3 months with Pulm follow up.  Hyperlipidemia: Continue Lipitor.   Goals of care:  PVS-guarded prognosis - palliative care was consulted sister adamant about continuing full scope of treatment.  Data Reviewed:  Recent Labs  Lab 06/30/21 0210 07/02/21 0814 07/05/21 0843 07/06/21 0221  WBC 11.5* 8.9 9.4 10.1  HGB 12.8* 12.8* 13.6 13.6  HCT 40.2 40.2 41.0 41.2  PLT 437* 383 401* 408*  MCV 89.1 88.5 87.0 87.7  MCH 28.4 28.2 28.9 28.9  MCHC 31.8 31.8 33.2 33.0  RDW 15.6* 15.9* 15.7* 15.7*  LYMPHSABS  --  3.3 3.0  --   MONOABS  --  0.9 1.2*  --   EOSABS  --  0.2 0.2  --   BASOSABS  --  0.1 0.1  --     Recent Labs  Lab 07/02/21 0814 07/05/21 0843 07/06/21 0221  NA 134* 132* 132*  K 4.2 5.0 4.3  CL 100 98 96*  CO2 24 24 25   GLUCOSE 137* 113* 148*  BUN 16 20 21*  CREATININE 0.60* 0.60* 0.65  CALCIUM 9.8 10.2 10.1  AST  --   --  29  ALT  --   --  108*  ALKPHOS  --   --  61  BILITOT  --   --  0.6  ALBUMIN  --   --  3.5  MG  --   --  1.9  TSH  --   --  3.149     Scheduled Meds:  arformoterol  15  mcg Nebulization BID   atorvastatin  40 mg Per Tube QHS   budesonide (PULMICORT) nebulizer solution  0.5 mg Nebulization BID   chlorhexidine  15 mL Mouth Rinse BID   clonazePAM  0.5 mg Per Tube BID   docusate  200 mg Per Tube BID   feeding supplement (PROSource TF)  45 mL Per Tube TID   fiber  1 packet Per Tube BID   free water  200 mL Per Tube Q6H   heparin  5,000 Units Subcutaneous Q8H   ketotifen  1 drop Both Eyes BID   levETIRAcetam  1,000 mg Per Tube BID   mouth rinse  15 mL Mouth Rinse q12n4p   metoprolol tartrate  100 mg Per Tube BID  pantoprazole sodium  40 mg Per Tube QHS   polyethylene glycol  17 g Per Tube BID   polyvinyl alcohol  2 drop Both Eyes Q6H   revefenacin  175 mcg Nebulization Daily   senna  1 tablet Per Tube BID   Continuous Infusions:  sodium chloride 250 mL (06/15/21 1719)   feeding supplement (JEVITY 1.5 CAL/FIBER) 1,000 mL (07/04/21 2138)    Active Problems:   Cardiac arrest (HCC)   Acute respiratory failure with hypoxia (HCC)   Seizure-like activity (HCC)   Anoxic encephalopathy (HCC)   Tracheostomy in place [facility])   Goals of care, counseling/discussion   AKI (acute kidney injury) (HCC)   Tracheostomy dependence (HCC)   Persistent vegetative state (HCC)   Generalized anxiety disorder   Other chronic pain   Ogilvie syndrome   Gram-negative infection   Acute UTI   Urinary tract infection due to ESBL Klebsiella   Consultants: Neurology General surgery Interventional radiology PCCM  Procedures: 2D echocardiogram Multiple EEGs Cortrak placement PEG tube placement  Antibiotics: Azithromycin 8/2 through 8/3 Cefepime 8/2 through 8/3 Ceftriaxone 8/3 through 8/7 Vancomycin 8/2 x 1 dose Zosyn 8/18 x 1 Unasyn 8/19 through 8/21 Cefazolin 8/24 through 8/24 Fluconazole 8/25 through 9/3 Zosyn 8/25 x 1 dose Cefepime 9/1 x 1 dose Vancomycin 9/1 x 1 dose Azithromycin 9/8 through 9/12 Zosyn 9/12 x 1 dose Meropenem 9/13 through/20   Time  spent: 25 minutes  68  days  Signature  Susa Raring M.D on 07/06/2021 at 9:56 AM   -  To page go to [email or web address]

## 2021-07-06 NOTE — Plan of Care (Signed)
  Problem: Activity: Goal: Risk for activity intolerance will decrease Outcome: Progressing   Problem: Nutrition: Goal: Adequate nutrition will be maintained Outcome: Progressing   Problem: Coping: Goal: Level of anxiety will decrease Outcome: Progressing   Problem: Elimination: Goal: Will not experience complications related to bowel motility 07/06/2021 0234 by Elray Mcgregor, LPN Outcome: Progressing 07/06/2021 0149 by Elray Mcgregor, LPN Outcome: Progressing   Problem: Pain Managment: Goal: General experience of comfort will improve Outcome: Progressing   Problem: Safety: Goal: Ability to remain free from injury will improve Outcome: Progressing   Problem: Skin Integrity: Goal: Risk for impaired skin integrity will decrease Outcome: Progressing

## 2021-07-07 ENCOUNTER — Inpatient Hospital Stay (HOSPITAL_COMMUNITY): Payer: Medicaid Other

## 2021-07-07 DIAGNOSIS — I469 Cardiac arrest, cause unspecified: Secondary | ICD-10-CM | POA: Diagnosis not present

## 2021-07-07 DIAGNOSIS — K5981 Ogilvie syndrome: Secondary | ICD-10-CM | POA: Diagnosis not present

## 2021-07-07 DIAGNOSIS — R403 Persistent vegetative state: Secondary | ICD-10-CM | POA: Diagnosis not present

## 2021-07-07 DIAGNOSIS — G931 Anoxic brain damage, not elsewhere classified: Secondary | ICD-10-CM | POA: Diagnosis not present

## 2021-07-07 LAB — GLUCOSE, CAPILLARY
Glucose-Capillary: 115 mg/dL — ABNORMAL HIGH (ref 70–99)
Glucose-Capillary: 129 mg/dL — ABNORMAL HIGH (ref 70–99)
Glucose-Capillary: 136 mg/dL — ABNORMAL HIGH (ref 70–99)
Glucose-Capillary: 141 mg/dL — ABNORMAL HIGH (ref 70–99)
Glucose-Capillary: 147 mg/dL — ABNORMAL HIGH (ref 70–99)

## 2021-07-07 LAB — BASIC METABOLIC PANEL
Anion gap: 10 (ref 5–15)
BUN: 22 mg/dL — ABNORMAL HIGH (ref 6–20)
CO2: 26 mmol/L (ref 22–32)
Calcium: 10.1 mg/dL (ref 8.9–10.3)
Chloride: 98 mmol/L (ref 98–111)
Creatinine, Ser: 0.63 mg/dL (ref 0.61–1.24)
GFR, Estimated: >60 mL/min (ref >60)
Glucose, Bld: 136 mg/dL — ABNORMAL HIGH (ref 70–99)
Potassium: 4.3 mmol/L (ref 3.5–5.1)
Sodium: 134 mmol/L — ABNORMAL LOW (ref 135–145)

## 2021-07-07 LAB — CBC
HCT: 40 % (ref 39.0–52.0)
Hemoglobin: 13.1 g/dL (ref 13.0–17.0)
MCH: 28.7 pg (ref 26.0–34.0)
MCHC: 32.8 g/dL (ref 30.0–36.0)
MCV: 87.5 fL (ref 80.0–100.0)
Platelets: 405 10*3/uL — ABNORMAL HIGH (ref 150–400)
RBC: 4.57 MIL/uL (ref 4.22–5.81)
RDW: 15.7 % — ABNORMAL HIGH (ref 11.5–15.5)
WBC: 10.6 10*3/uL — ABNORMAL HIGH (ref 4.0–10.5)
nRBC: 0 % (ref 0.0–0.2)

## 2021-07-07 LAB — URINE CULTURE: Culture: <10000 — AB

## 2021-07-07 LAB — PROCALCITONIN: Procalcitonin: 0.61 ng/mL

## 2021-07-07 MED ORDER — ACETAMINOPHEN 160 MG/5ML PO SOLN
650.0000 mg | Freq: Four times a day (QID) | ORAL | Status: DC | PRN
  Administered 2021-07-07 – 2021-07-20 (×6): 650 mg
  Filled 2021-07-07 (×6): qty 20.3

## 2021-07-07 NOTE — TOC Progression Note (Signed)
Transition of Care [facility]) - Progression Note    Patient Details  Name: Vincent [name] MRN: [id number] Date of Birth: 12/10/1970  Transition of Care [facility]) CM/SW Contact  Janae Bridgeman, RN Phone Number: 07/07/2021, 10:28 AM  Clinical Narrative:    CM and MSW with DTP Team will continue to follow the patient for needed SNF placement involving medical care for trach/ trach collar / PEG.  CM reached out to SNF facilities for placement including: [facility] - under review by DON [facility] - under review by [employer] - not accepting admissions at this time [facility], [place] - not taking trach patients at this time [facility], [facility]- left message with [employer], CM [phone] [facility], [place] - left message with Lyn [place], [place] [phone]  CM and MSW with DTP Team will continue to follow the patient for SNF placement - No bed offers at this time.   Expected Discharge Plan: Skilled Nursing Facility Barriers to Discharge: Continued Medical Work up, No SNF bed  Expected Discharge Plan and Services Expected Discharge Plan: Skilled Nursing Facility In-house Referral: Clinical Social Work Discharge Planning Services: CM Consult Post Acute Care Choice: Skilled Nursing Facility Living arrangements for the past 2 months: Single Family Home                                       Social Determinants of Health (SDOH) Interventions    Readmission Risk Interventions No flowsheet data found.

## 2021-07-07 NOTE — Progress Notes (Signed)
[facility] PROGRESS NOTE  Vincent [name] VHQ:[id number] DOB: Aug 30, 1971 DOA: 04/29/2021  Patient goes by Vincent [name]   PCP: Pcp, No  Status: Remains inpatient appropriate because:Altered mental status and Unsafe d/c plan  Dispo: The patient is from: Home              Anticipated d/c is to: SNF-trach capable              Patient currently is medically stable to d/c.  Difficult to place patient Yes               Barriers to discharge: Medicaid funding- permanent trach  Level of care: Progressive   Code Status: DNR Family Communication: Sister Vincent [name] on 10/5.  She is hopeful that he will get a bed at [facility] since that is only 45 minutes from her home DVT prophylaxis: Subcutaneous heparin COVID vaccination status: Unknown  HPI:  50 y.o. 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  He has had repetitive blinking concerning for seizure activity but EEG has been negative.  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  Since admission he has been treated for sepsis secondary to Blood culture showed Klebsiella and MSSA, he did complete a course of antibiotics.  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 Goals of care discussion held by palliative care team.  Patient's sister was adamant about continuing full scope of treatment although patient does remain a DNR   Subjective: Awake.  Continues to track movements in room.  Remains nonverbal  Objective: Vitals:   07/07/21 0425 07/07/21 0700  BP: 118/60   Pulse: (!) 104 (!) 103  Resp: (!) 27 (!) 21  Temp: 98 F (36.7 C)   SpO2: 98%     Intake/Output Summary (Last 24  hours) at 07/07/2021 0726 Last data filed at 07/06/2021 1348 Gross per 24 hour  Intake 120 ml  Output 750 ml  Net -630 ml   Filed Weights   07/04/21 0459 07/05/21 0247 07/07/21 0500  Weight: 66.4 kg 66.4 kg 62.7 kg    Exam:  Constitutional: Alert and in no acute distress. Respiratory: #6.0 cuffed Shiley trach with RA via TC, lung sounds remain clear to auscultation without increased work of breathing.  Pulse oximetry 93 to 99% Cardiovascular: Heart sounds are S1-S2, no peripheral edema, remains normotensive persistent but stable tachycardia, extremities warm and dry Abdomen: no tenderness, soft and distended (Ogilvie syndrome), G-tube site unremarkable, LBM 10/09, active bowel sounds Neurologic: CN 2-12 grossly intact Psychiatric: Alert.  Nonverbal.  Significant Events: 8/2 PEA Arrest, King airway in the field, CPR x6 minutes and epi x1, ROSC, Intubated in ED, [facility]  8/24 PEG placed 8/4 Weaned off TTM, Coox O2 satof 47%. 8/8 Post pyloric Cortrak placed, myoclonic jerking, started Keppra, depakote 8/10 Completed antibiotic course for pneumonia. Family meeting transitioned to DNR. 8/15 Tracheostomy  8/18 Febrile overnight, started Zosyn for aspiration pneumonia 8/22 Completed Abx course for aspiration pneumonia  8/24 PEG placed 8/31 PSV wean 2 hours 9/1 Stopped lasix, farxiga, losartan, spiro with AKI. Ongoing jerking, diaphoresis. WBC increased, cultured / abx started empirically.  PCT increased. CK 2,450 9/2 start scheduled opiates for possible  opiate withdrawal symptoms 9/4 add flexeril, on PSV.  Versed x1 overnight. 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: Continue Keppra-recent EEG negative  Acute respiratory failure with hypoxia in the setting of cardiac  arrest and aspiration pneumonia: Trach team/PCCM following but given his persistent vegetative state he is not a candidate for decannulation  Hypothermia/altered mental status 2/2 ESBL Klebsiella UTI Sepsis work-up negative-completed 5 days of meropenem  Suspected anxiety/pain Continue scheduled ibuprofen, Oxy IR 2.5 mg prn and scheduled Klonopin  Injected sclera bilaterally Continue Zaditor eyedrops  Sepsis due to ESBL Klebsiella tracheobronchitis: Resolved   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  Hypovolemic hyponatremia: Resolved.    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  Nutrition: Status post PEG tube placement continue tube feedings and free water flushes.  Left upper lung nodule: Will need a follow-up CT in 3 months.  Hyperlipidemia: Continue Lipitor.   Goals of care: PVS-guarded prognosis -palliative care was consulted sister adamant about continuing full scope of treatment.         Data Reviewed: Basic Metabolic Panel: Recent Labs  Lab 07/02/21 0814 07/05/21 0843 07/06/21 0221 07/07/21 0206  NA 134* 132* 132* 134*  K 4.2 5.0 4.3 4.3  CL 100 98 96* 98  CO2 24 24 25 26   GLUCOSE 137* 113* 148* 136*  BUN 16 20 21* 22*  CREATININE 0.60* 0.60* 0.65 0.63  CALCIUM 9.8 10.2 10.1 10.1  MG  --   --  1.9  --    Liver Function Tests: Recent Labs  Lab 07/06/21 0221  AST 29  ALT 108*  ALKPHOS 61  BILITOT 0.6  PROT 7.1  ALBUMIN 3.5    CBC: Recent Labs  Lab 07/02/21 0814 07/05/21 0843 07/06/21 0221 07/07/21 0206  WBC 8.9 9.4 10.1 10.6*  NEUTROABS 4.3 4.8  --   --   HGB 12.8* 13.6 13.6 13.1  HCT 40.2 41.0 41.2 40.0  MCV 88.5  87.0 87.7 87.5  PLT 383 401* 408* 405*    BNP (last 3 results) Recent Labs    04/29/21 2048 06/09/21 0350  BNP 626.7* 406.5*    CBG: Recent Labs  Lab 07/06/21 1207 07/06/21 1644 07/06/21 1939 07/07/21 0013 07/07/21 0430  GLUCAP 174* 129* 132* 129* 136*       Scheduled Meds:  arformoterol  15 mcg Nebulization BID   atorvastatin  40 mg Per Tube QHS   budesonide (PULMICORT) nebulizer solution  0.5 mg Nebulization BID   chlorhexidine  15 mL Mouth Rinse BID   clonazePAM  0.5 mg Per Tube BID   docusate  200 mg Per Tube BID   feeding supplement (PROSource TF)  45 mL Per Tube TID   fiber  1 packet Per Tube BID   free water  200 mL Per Tube Q6H   heparin  5,000 Units Subcutaneous Q8H   ketotifen  1 drop Both Eyes BID   levETIRAcetam  1,000 mg Per Tube BID   mouth rinse  15 mL Mouth Rinse q12n4p   metoprolol tartrate  100 mg Per Tube BID   pantoprazole sodium  40 mg Per  Tube QHS   polyethylene glycol  17 g Per Tube BID   polyvinyl alcohol  2 drop Both Eyes Q6H   revefenacin  175 mcg Nebulization Daily   senna  1 tablet Per Tube BID   Continuous Infusions:  sodium chloride 250 mL (06/15/21 1719)   feeding supplement (JEVITY 1.5 CAL/FIBER) 1,000 mL (07/06/21 1352)    Active Problems:   Cardiac arrest (HCC)   Acute respiratory failure with hypoxia (HCC)   Seizure-like activity (HCC)   Anoxic encephalopathy (HCC)   Tracheostomy in place [facility])   Goals of care, counseling/discussion   AKI (acute kidney injury) (HCC)   Tracheostomy dependence (HCC)   Persistent vegetative state (HCC)   Generalized anxiety disorder   Other chronic pain   Ogilvie syndrome   Gram-negative infection   Acute UTI   Urinary tract infection due to ESBL Klebsiella   Consultants: Neurology General surgery Interventional radiology PCCM  Procedures: 2D echocardiogram Multiple EEGs Cortrak placement PEG tube placement  Antibiotics: Azithromycin 8/2 through 8/3 Cefepime 8/2 through  8/3 Ceftriaxone 8/3 through 8/7 Vancomycin 8/2 x 1 dose Zosyn 8/18 x 1 Unasyn 8/19 through 8/21 Cefazolin 8/24 through 8/24 Fluconazole 8/25 through 9/3 Zosyn 8/25 x 1 dose Cefepime 9/1 x 1 dose Vancomycin 9/1 x 1 dose Azithromycin 9/8 through 9/12 Zosyn 9/12 x 1 dose Meropenem 9/13 through/20   Time spent: 25 minutes    Junious Silk ANP  [facility] 7 am - 330 pm/M-F for direct patient care and secure chat Please refer to Amion for contact info 69  days

## 2021-07-08 DIAGNOSIS — I469 Cardiac arrest, cause unspecified: Secondary | ICD-10-CM | POA: Diagnosis not present

## 2021-07-08 DIAGNOSIS — R403 Persistent vegetative state: Secondary | ICD-10-CM | POA: Diagnosis not present

## 2021-07-08 DIAGNOSIS — Z930 Tracheostomy status: Secondary | ICD-10-CM | POA: Diagnosis not present

## 2021-07-08 DIAGNOSIS — K5981 Ogilvie syndrome: Secondary | ICD-10-CM | POA: Diagnosis not present

## 2021-07-08 LAB — CBC WITH DIFFERENTIAL/PLATELET
Abs Immature Granulocytes: 0.11 10*3/uL — ABNORMAL HIGH (ref 0.00–0.07)
Basophils Absolute: 0.1 10*3/uL (ref 0.0–0.1)
Basophils Relative: 1 %
Eosinophils Absolute: 0.3 10*3/uL (ref 0.0–0.5)
Eosinophils Relative: 3 %
HCT: 39.3 % (ref 39.0–52.0)
Hemoglobin: 12.9 g/dL — ABNORMAL LOW (ref 13.0–17.0)
Immature Granulocytes: 1 %
Lymphocytes Relative: 36 %
Lymphs Abs: 3.7 10*3/uL (ref 0.7–4.0)
MCH: 28.7 pg (ref 26.0–34.0)
MCHC: 32.8 g/dL (ref 30.0–36.0)
MCV: 87.3 fL (ref 80.0–100.0)
Monocytes Absolute: 1.2 10*3/uL — ABNORMAL HIGH (ref 0.1–1.0)
Monocytes Relative: 11 %
Neutro Abs: 5.1 10*3/uL (ref 1.7–7.7)
Neutrophils Relative %: 48 %
Platelets: 431 10*3/uL — ABNORMAL HIGH (ref 150–400)
RBC: 4.5 MIL/uL (ref 4.22–5.81)
RDW: 15.7 % — ABNORMAL HIGH (ref 11.5–15.5)
WBC: 10.4 10*3/uL (ref 4.0–10.5)
nRBC: 0 % (ref 0.0–0.2)

## 2021-07-08 LAB — BASIC METABOLIC PANEL
Anion gap: 10 (ref 5–15)
BUN: 20 mg/dL (ref 6–20)
CO2: 25 mmol/L (ref 22–32)
Calcium: 10 mg/dL (ref 8.9–10.3)
Chloride: 100 mmol/L (ref 98–111)
Creatinine, Ser: 0.64 mg/dL (ref 0.61–1.24)
GFR, Estimated: >60 mL/min (ref >60)
Glucose, Bld: 128 mg/dL — ABNORMAL HIGH (ref 70–99)
Potassium: 4.1 mmol/L (ref 3.5–5.1)
Sodium: 135 mmol/L (ref 135–145)

## 2021-07-08 LAB — GLUCOSE, CAPILLARY
Glucose-Capillary: 102 mg/dL — ABNORMAL HIGH (ref 70–99)
Glucose-Capillary: 118 mg/dL — ABNORMAL HIGH (ref 70–99)
Glucose-Capillary: 129 mg/dL — ABNORMAL HIGH (ref 70–99)
Glucose-Capillary: 135 mg/dL — ABNORMAL HIGH (ref 70–99)
Glucose-Capillary: 140 mg/dL — ABNORMAL HIGH (ref 70–99)
Glucose-Capillary: 141 mg/dL — ABNORMAL HIGH (ref 70–99)
Glucose-Capillary: 145 mg/dL — ABNORMAL HIGH (ref 70–99)

## 2021-07-08 LAB — PROCALCITONIN: Procalcitonin: <0.1 ng/mL

## 2021-07-08 MED ORDER — LACTATED RINGERS IV SOLN: INTRAVENOUS | Status: AC

## 2021-07-08 NOTE — Plan of Care (Signed)

## 2021-07-08 NOTE — TOC Progression Note (Signed)
Transition of Care [facility]) - Progression Note    Patient Details  Name: Vincent [name] MRN: [id number] Date of Birth: 1970/11/21  Transition of Care [facility]) CM/SW Contact  Janae Bridgeman, RN Phone Number: 07/08/2021, 11:27 AM  Clinical Narrative:    CM called and spoke with Vincenza Hews, CM with [facility] and requested assistance with placement at a skilled nursing facility with LTC bed availability for the patient.  The patient's clinicals were placed in the hub for review by [facility] facilities at this time for possible bed offer in the area.  CM and MSW with DTP Team will continue to follow the patient for [facility] placement.   Expected Discharge Plan: Skilled Nursing Facility Barriers to Discharge: Continued Medical Work up, No SNF bed  Expected Discharge Plan and Services Expected Discharge Plan: Skilled Nursing Facility In-house Referral: Clinical Social Work Discharge Planning Services: CM Consult Post Acute Care Choice: Skilled Nursing Facility Living arrangements for the past 2 months: Single Family Home                                       Social Determinants of Health (SDOH) Interventions    Readmission Risk Interventions No flowsheet data found.

## 2021-07-08 NOTE — TOC Progression Note (Signed)
Transition of Care [facility]) - Progression Note    Patient Details  Name: Vincent [name] MRN: [id number] Date of Birth: [birth year]  Transition [facility] Care [facility]) CM/SW Contact  Janae Bridgeman, RN Phone Number: 07/08/2021, 10:58 AM  Clinical Narrative:    CM spoke with [facility] and no beds are available at this time for admission.  CM and MSW with DTP Team will continue to explore options for SNF placement.   Expected Discharge Plan: Skilled Nursing Facility Barriers to Discharge: Continued Medical Work up, No SNF bed  Expected Discharge Plan and Services Expected Discharge Plan: Skilled Nursing Facility In-house Referral: Clinical Social Work Discharge Planning Services: CM Consult Post Acute Care Choice: Skilled Nursing Facility Living arrangements for the past 2 months: Single Family Home                                       Social Determinants of Health (SDOH) Interventions    Readmission Risk Interventions No flowsheet data found.

## 2021-07-08 NOTE — Progress Notes (Signed)
[facility] PROGRESS NOTE  Vincent [name] [id number]:[id number] DOB: 05-03-1971 DOA: 04/29/2021  Patient goes by Vincent [name]   PCP: Pcp, No  Status: Remains inpatient appropriate because:Altered mental status and Unsafe d/c plan  Dispo: The patient is from: Home              Anticipated d/c is to: SNF-trach capable              Patient currently is medically stable to d/c.  Difficult to place patient Yes               Barriers to discharge: Medicaid funding- permanent trach  Level of care: Progressive   Code Status: DNR Family Communication: Sister Vincent [name] on 10/5.  She is hopeful that he will get a bed at [facility] since that is only 45 minutes from her home DVT prophylaxis: Subcutaneous heparin COVID vaccination status: Unknown  HPI:  50 y.o. 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  He has had repetitive blinking concerning for seizure activity but EEG has been negative.  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  Since admission he has been treated for sepsis secondary to Blood culture showed Klebsiella and MSSA, he did complete a course of antibiotics.  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 Goals of care discussion held by palliative care team.  Patient's sister was adamant about continuing full scope of treatment although patient does remain a DNR   Subjective: Patient awake this morning.  Smiled when I was picking on him about his trach collar tubing being disconnected.  Trying to purposefully use RUE and also attempting to vocalize although only grunts are made.  Objective: Vitals:   07/08/21 0040 07/08/21  0400  BP:    Pulse: 97 97  Resp: 20 18  Temp:    SpO2:  96%    Intake/Output Summary (Last 24 hours) at 07/08/2021 0722 Last data filed at 07/07/2021 1824 Gross per 24 hour  Intake 1080 ml  Output 300 ml  Net 780 ml   Filed Weights   07/04/21 0459 07/05/21 0247 07/07/21 0500  Weight: 66.4 kg 66.4 kg 62.7 kg    Exam:  Constitutional: Alert and in no acute distress. Respiratory: #6.0 cuffed Shiley trach, stable on room air vi TC.  Anterior lung sounds remain clear pulse oximetry  99% Cardiovascular: S1-S2, no peripheral edema, normotensive, persistent/stable tachycardia, extremities warm and dry Abdomen: Nontender soft and distended (Ogilvie syndrome) with active bowel sounds, G-tube site unremarkable, LBM 10/10 Neurologic: CN 2-12 grossly intact, intermittent purposeful movement of upper extremities although quite dysmetric, hands remain clenched.  Tracks around room. Psychiatric: Alert.  Nonverbal and attempts to phonate come across as grunts.  He did smile today  Significant Events: 8/2 PEA Arrest, King airway in the field, CPR x6 minutes and epi x1, ROSC, Intubated in ED, Admitted to [facility]  8/24 PEG placed 8/4 Weaned off TTM, Coox O2 satof 47%. 8/8 Post pyloric Cortrak placed, myoclonic jerking, started Keppra, depakote 8/10 Completed antibiotic course for pneumonia. Family meeting transitioned to DNR. 8/15 Tracheostomy  8/18 Febrile overnight, started Zosyn for aspiration pneumonia 8/22 Completed Abx course for aspiration pneumonia  8/24 PEG  placed 8/31 PSV wean 2 hours 9/1 Stopped lasix, farxiga, losartan, spiro with AKI. Ongoing jerking, diaphoresis. WBC increased, cultured / abx started empirically.  PCT increased. CK 2,450 9/2 start scheduled opiates for possible opiate withdrawal symptoms 9/4 add flexeril, on PSV.  Versed x1 overnight. 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: Continue Keppra-no further seizure activity documented  Acute respiratory failure with hypoxia in the setting of cardiac arrest and aspiration pneumonia: Trach team/PCCM following periodic --given his persistent vegetative state he is not a candidate for decannulation  Hypothermia/altered mental status 2/2 ESBL Klebsiella UTI Sepsis work-up negative-completed 5 days of meropenem  Suspected anxiety/pain Continue scheduled ibuprofen, Oxy IR 2.5 mg prn and scheduled Klonopin  Injected sclera bilaterally Continue Zaditor eyedrops  Sepsis due to ESBL Klebsiella tracheobronchitis: Resolved   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  Hypovolemic hyponatremia: Resolved.    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  Nutrition: Status post PEG tube placement continue tube feedings and free water flushes.  Left upper lung nodule: Will need a follow-up CT in 3 months.  Hyperlipidemia: Continue Lipitor.   Goals of care: PVS-guarded prognosis -palliative care was consulted sister adamant about continuing full scope of treatment.         Data Reviewed: Basic Metabolic Panel: Recent Labs  Lab 07/02/21 0814 07/05/21 0843 07/06/21 0221 07/07/21 0206  NA 134* 132* 132* 134*  K 4.2 5.0 4.3 4.3  CL 100 98 96* 98  CO2 24 24 25 26   GLUCOSE 137* 113* 148* 136*  BUN 16 20 21* 22*  CREATININE 0.60* 0.60* 0.65 0.63  CALCIUM 9.8 10.2 10.1 10.1  MG  --   --  1.9  --    Liver Function Tests: Recent Labs  Lab 07/06/21 0221  AST 29  ALT 108*  ALKPHOS 61   BILITOT 0.6  PROT 7.1  ALBUMIN 3.5    CBC: Recent Labs  Lab 07/02/21 0814 07/05/21 0843 07/06/21 0221 07/07/21 0206  WBC 8.9 9.4 10.1 10.6*  NEUTROABS 4.3 4.8  --   --   HGB 12.8* 13.6 13.6 13.1  HCT 40.2 41.0 41.2 40.0  MCV 88.5 87.0 87.7 87.5  PLT 383 401* 408* 405*    BNP (last 3 results) Recent Labs    04/29/21 2048 06/09/21 0350  BNP 626.7* 406.5*    CBG: Recent Labs  Lab 07/07/21 0757 07/07/21 1146 07/07/21 2036 07/07/21 2359 07/08/21 0500  GLUCAP 147* 141* 115* 118* 140*       Scheduled Meds:  arformoterol  15 mcg Nebulization BID   atorvastatin  40 mg Per Tube QHS   budesonide (PULMICORT) nebulizer solution  0.5 mg Nebulization BID   chlorhexidine  15 mL Mouth Rinse BID   clonazePAM  0.5 mg Per Tube BID   docusate  200 mg Per Tube BID   feeding supplement (PROSource TF)  45 mL Per Tube TID   fiber  1 packet Per Tube BID   free water  200 mL Per Tube Q6H   heparin  5,000 Units Subcutaneous Q8H   ketotifen  1 drop Both Eyes BID   levETIRAcetam  1,000 mg Per Tube BID   mouth rinse  15 mL Mouth Rinse q12n4p   metoprolol tartrate  100 mg Per Tube BID   pantoprazole sodium  40 mg Per Tube QHS   polyethylene glycol  17 g Per Tube BID   polyvinyl alcohol  2 drop Both Eyes Q6H   revefenacin  175 mcg Nebulization Daily   senna  1 tablet Per Tube BID   Continuous Infusions:  sodium chloride 250 mL (06/15/21 1719)   feeding supplement (JEVITY 1.5 CAL/FIBER) 1,000 mL (07/07/21 0800)    Active Problems:   Cardiac arrest (HCC)   Acute respiratory failure with hypoxia (HCC)   Seizure-like activity (HCC)   Anoxic encephalopathy (HCC)   Tracheostomy in place [facility])   Goals of care, counseling/discussion   AKI (acute kidney injury) (HCC)   Tracheostomy dependence (HCC)   Persistent vegetative state (HCC)   Generalized anxiety disorder   Other chronic pain   Ogilvie syndrome   Gram-negative infection   Acute UTI   Urinary tract infection due to  ESBL Klebsiella   Consultants: Neurology General surgery Interventional radiology PCCM  Procedures: 2D echocardiogram Multiple EEGs Cortrak placement PEG tube placement  Antibiotics: Azithromycin 8/2 through 8/3 Cefepime 8/2 through 8/3 Ceftriaxone 8/3 through 8/7 Vancomycin 8/2 x 1 dose Zosyn 8/18 x 1 Unasyn 8/19 through 8/21 Cefazolin 8/24 through 8/24 Fluconazole 8/25 through 9/3 Zosyn 8/25 x 1 dose Cefepime 9/1 x 1 dose Vancomycin 9/1 x 1 dose Azithromycin 9/8 through 9/12 Zosyn 9/12 x 1 dose Meropenem 9/13 through 9/20   Time spent: 25 minutes    Junious Silk ANP  [facility] 7 am - 330 pm/M-F for direct patient care and secure chat Please refer to Amion for contact info 70  days

## 2021-07-09 DIAGNOSIS — I469 Cardiac arrest, cause unspecified: Secondary | ICD-10-CM | POA: Diagnosis not present

## 2021-07-09 DIAGNOSIS — R403 Persistent vegetative state: Secondary | ICD-10-CM | POA: Diagnosis not present

## 2021-07-09 DIAGNOSIS — G8929 Other chronic pain: Secondary | ICD-10-CM | POA: Diagnosis not present

## 2021-07-09 DIAGNOSIS — K5981 Ogilvie syndrome: Secondary | ICD-10-CM | POA: Diagnosis not present

## 2021-07-09 LAB — GLUCOSE, CAPILLARY
Glucose-Capillary: 93 mg/dL (ref 70–99)
Glucose-Capillary: 112 mg/dL — ABNORMAL HIGH (ref 70–99)
Glucose-Capillary: 157 mg/dL — ABNORMAL HIGH (ref 70–99)
Glucose-Capillary: 120 mg/dL — ABNORMAL HIGH (ref 70–99)
Glucose-Capillary: 117 mg/dL — ABNORMAL HIGH (ref 70–99)

## 2021-07-09 NOTE — Plan of Care (Signed)
  Problem: Clinical Measurements: Goal: Diagnostic test results will improve Outcome: Progressing Goal: Respiratory complications will improve Outcome: Progressing   Problem: Activity: Goal: Risk for activity intolerance will decrease Outcome: Progressing   

## 2021-07-09 NOTE — Progress Notes (Signed)
   07/08/21 2346  Assess: MEWS Score  Temp 100.1 F (37.8 C)  BP 103/73  Pulse Rate (!) 103  ECG Heart Rate (!) 104  Resp (!) 23  SpO2 96 %  Assess: MEWS Score  MEWS Temp 0  MEWS Systolic 0  MEWS Pulse 1  MEWS RR 1  MEWS LOC 0  MEWS Score 2  MEWS Score Color Yellow  Assess: if the MEWS score is Yellow or Red  Were vital signs taken at a resting state? Yes  Focused Assessment No change from prior assessment  Early Detection of Sepsis Score *See Row Information* Low  MEWS guidelines implemented *See Row Information* No, previously yellow, continue vital signs every 4 hours  Pt has previous yellow MEWS, MD aware

## 2021-07-09 NOTE — Progress Notes (Signed)
   07/08/21 2000  Assess: MEWS Score  Temp 98 F (36.7 C)  BP 116/77  Pulse Rate (!) 120  ECG Heart Rate (!) 122  Resp 20  Level of Consciousness Responds to Pain  SpO2 95 %  O2 Device Tracheostomy Collar  O2 Flow Rate (L/min) 5 L/min  FiO2 (%) 21 %  Assess: MEWS Score  MEWS Temp 0  MEWS Systolic 0  MEWS Pulse 2  MEWS RR 0  MEWS LOC 2  MEWS Score 4  MEWS Score Color Red  Assess: if the MEWS score is Yellow or Red  Were vital signs taken at a resting state? Yes  Focused Assessment No change from prior assessment  Early Detection of Sepsis Score *See Row Information* Low  MEWS guidelines implemented *See Row Information* No, vital signs rechecked  No acute distress, will continue to monitor pt.

## 2021-07-09 NOTE — Progress Notes (Signed)
Nutrition Follow-up  DOCUMENTATION CODES:  Not applicable  INTERVENTION:  Continue TF via PEG: -Jevity 1.5 at 60 ml/h (1440 ml/d) -19m Prosource TF TID -Free water flushes 200 ml every 6 hours  Provides 2280 kcal, 125 gm protein, 1094 ml free water daily (1894 ml total with flushes)  NUTRITION DIAGNOSIS:  Inadequate oral intake related to inability to eat as evidenced by NPO status. -- ongoing   GOAL:  Patient will meet greater than or equal to 90% of their needs -- met with TF  MONITOR:  Labs, Weight trends, TF tolerance, Skin, I & O's  REASON FOR ASSESSMENT:  Ventilator, Consult Enteral/tube feeding initiation and management  ASSESSMENT:  50yo male admitted S/P PEA cardiac arrest at home. PMH includes tobacco abuse, emphysema, CVA, diabetes.  Pt sleeping at time of RD visit and did not wake to RD voice/touch. Pt awaiting placement in trach-capable facility. Not candidate for decannulation due to PVSper trach team. Pt remains NPO and continues to receive TF via PEG. Tolerating well per RN. Current TF orders: Jevity 1.5 at 60 ml/h with Prosource TF 45 ml TID and 2068mfree water Q6H.  Medications: Nutrisource fiber BID per tube, Colace, Protonix, Miralax, Senokot, Keppra Labs reviewed. CBGs: 93-157 x 24 hours  Current weight 62.7 kg Admission weight 70 kg   UOP: 30040mocumented x24 hours  Diet Order:   Diet Order     None      EDUCATION NEEDS:  Not appropriate for education at this time  Skin:  Skin Assessment: Reviewed RN Assessment  Last BM:  10/10  Height:  Ht Readings from Last 1 Encounters:  05/26/21 5' 8"  (1.727 m)   Weight:  Wt Readings from Last 1 Encounters:  07/09/21 62.7 kg   Ideal Body Weight:  70 kg  BMI:  Body mass index is 21.02 kg/m.  Estimated Nutritional Needs:  Kcal:  2000-2200 Protein:  110-125 gm Fluid:  >/= 2 L    AmaLarkin InaS, RD, LDN (she/her/hers) RD pager number and weekend/on-call pager number located in  [place]

## 2021-07-09 NOTE — Progress Notes (Addendum)
[facility] PROGRESS NOTE  Vincent [name] AST:[id number] DOB: Feb 22, 1971 DOA: 04/29/2021  Patient goes by Vincent [name]   PCP: Pcp, No  Status: Remains inpatient appropriate because:Altered mental status and Unsafe d/c plan  Dispo: The patient is from: Home              Anticipated d/c is to: SNF-trach capable              Patient currently is medically stable to d/c.  Difficult to place patient Yes               Barriers to discharge: Medicaid funding-availability of trach capable facilities  Level of care: Progressive   Code Status: DNR Family Communication: Sister [name] on 10/5.  She is hopeful that he will get a bed at [facility] since that is only 45 minutes from her home DVT prophylaxis: Subcutaneous heparin COVID vaccination status: Unknown  HPI:  51 y.o. 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  He has had repetitive blinking concerning for seizure activity but EEG has been negative.  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  Since admission he has been treated for sepsis secondary to Blood culture showed Klebsiella and MSSA, he did complete a course of antibiotics.  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 Goals of care discussion held by palliative care team.  Patient's sister was adamant about continuing full scope of treatment although patient does remain a DNR   Subjective: Sleeping soundly so I did not awaken.  He did not awaken during my exam either.  Objective: Vitals:   07/09/21 1105 07/09/21 1246  BP: 127/76 108/78  Pulse: (!) 106 (!) 102  Resp: (!) 23 (!) 23  Temp:    SpO2: 98%      Intake/Output Summary (Last 24 hours) at 07/09/2021 1428 Last data filed at 07/09/2021 0940 Gross per 24 hour  Intake 1734.75 ml  Output 300 ml  Net 1434.75 ml   Filed Weights   07/05/21 0247 07/07/21 0500 07/09/21 0401  Weight: 66.4 kg 62.7 kg 62.7 kg    Exam:  Constitutional: Sleeping soundly, no apparent distress. Respiratory: #6.0 cuffed Shiley trach, stable on room air via TC.  Lung sounds clear, no increased work of breathing, pulse ox 98 to 100% Cardiovascular: S1-S2, no peripheral edema, normotensive, chronic but stable tachycardia, extremities warm and dry Abdomen: Nontender soft and chronically distended, active bowel sounds, G-tube site unremarkable, LBM 10/10 Neurologic: CN 2-12 grossly intact, currently asleep so exam limited but at baseline tracks persons in room, occasionally smiles, occasionally has purposeful but dysmetric movements of upper extremities Psychiatric: Sleeping soundly.  Significant Events: 8/2 PEA Arrest, King airway in the field, CPR x6 minutes and epi x1, ROSC, Intubated in ED, Admitted to [facility]  8/24 PEG placed 8/4 Weaned off TTM, Coox O2 satof 47%. 8/8 Post pyloric Cortrak placed, myoclonic jerking, started Keppra, depakote 8/10 Completed antibiotic course for pneumonia. Family meeting transitioned to DNR. 8/15 Tracheostomy  8/18 Febrile overnight, started Zosyn for aspiration pneumonia 8/22 Completed Abx course for aspiration pneumonia  8/24 PEG placed 8/31 PSV wean 2 hours 9/1 Stopped lasix, farxiga, losartan, spiro with AKI. Ongoing jerking, diaphoresis.  WBC increased, cultured / abx started empirically.  PCT increased. CK 2,450 9/2 start scheduled opiates for possible opiate withdrawal symptoms 9/4 add flexeril, on PSV.  Versed x1 overnight. 9/9 has tolerated trach collar for 48+hours 9/23 repetitive blinking with concerns for seizure activity-EEG ordered -was negative  Assessment/Plan: Acute problems: Anoxic brain injury status  post PEA arrest w/ PVS/myoclonus seizures: Continue Keppra-no further seizure activity documented  Acute respiratory failure with hypoxia in the setting of cardiac arrest and aspiration pneumonia: Trach team following prn --due to PVS not a candidate for decannulation  Hypothermia/altered mental status 2/2 ESBL Klebsiella UTI Sepsis work-up negative-s/p 5 days of meropenem  Suspected anxiety/pain Continue Tylenol prn, Oxy IR 2.5 mg prn and scheduled Klonopin  Injected sclera bilaterally Continue Zaditor eyedrops  Sepsis due to ESBL Klebsiella tracheobronchitis: Resolved   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  Hypovolemic hyponatremia: Resolved.    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  Nutrition: Status post PEG tube placement continue tube feedings and free water flushes.  Left upper lung nodule: Will need a follow-up CT in 3 months.  Hyperlipidemia: Continue Lipitor.   Goals of care: PVS-guarded prognosis -palliative care was consulted sister adamant about continuing full scope of treatment.         Data Reviewed: Basic Metabolic Panel: Recent Labs  Lab 07/05/21 0843 07/06/21 0221 07/07/21 0206 07/08/21 0758  NA 132* 132* 134* 135  K 5.0 4.3 4.3 4.1  CL 98 96* 98 100  CO2 24 25 26 25   GLUCOSE 113* 148* 136* 128*  BUN 20 21* 22* 20  CREATININE 0.60* 0.65 0.63 0.64  CALCIUM 10.2 10.1 10.1 10.0  MG  --  1.9  --   --    Liver Function Tests: Recent Labs  Lab 07/06/21 0221  AST 29  ALT 108*  ALKPHOS 61  BILITOT 0.6  PROT 7.1  ALBUMIN 3.5    CBC: Recent Labs  Lab 07/05/21 0843 07/06/21 0221 07/07/21 0206 07/08/21 0758  WBC 9.4 10.1 10.6*  10.4  NEUTROABS 4.8  --   --  5.1  HGB 13.6 13.6 13.1 12.9*  HCT 41.0 41.2 40.0 39.3  MCV 87.0 87.7 87.5 87.3  PLT 401* 408* 405* 431*    BNP (last 3 results) Recent Labs    04/29/21 2048 06/09/21 0350  BNP 626.7* 406.5*    CBG: Recent Labs  Lab 07/08/21 1949 07/08/21 2344 07/09/21 0352 07/09/21 0821 07/09/21 1244  GLUCAP 102* 129* 93 112* 157*       Scheduled Meds:  arformoterol  15 mcg Nebulization BID   atorvastatin  40 mg Per Tube QHS   budesonide (PULMICORT) nebulizer solution  0.5 mg Nebulization BID   chlorhexidine  15 mL Mouth Rinse BID   clonazePAM  0.5 mg Per Tube BID   docusate  200 mg Per Tube BID   feeding supplement (PROSource TF)  45 mL Per Tube TID   fiber  1 packet Per Tube BID   free water  200 mL Per Tube Q6H   heparin  5,000 Units Subcutaneous Q8H   ketotifen  1 drop Both Eyes BID   levETIRAcetam  1,000 mg Per Tube BID   mouth rinse  15 mL Mouth Rinse q12n4p   metoprolol tartrate  100 mg  Per Tube BID   pantoprazole sodium  40 mg Per Tube QHS   polyethylene glycol  17 g Per Tube BID   polyvinyl alcohol  2 drop Both Eyes Q6H   revefenacin  175 mcg Nebulization Daily   senna  1 tablet Per Tube BID   Continuous Infusions:  sodium chloride 250 mL (06/15/21 1719)   feeding supplement (JEVITY 1.5 CAL/FIBER) 1,000 mL (07/09/21 0351)    Active Problems:   Cardiac arrest (HCC)   Acute respiratory failure with hypoxia (HCC)   Seizure-like activity (HCC)   Anoxic encephalopathy (HCC)   Tracheostomy in place [facility])   Goals of care, counseling/discussion   AKI (acute kidney injury) (HCC)   Tracheostomy dependence (HCC)   Persistent vegetative state (HCC)   Generalized anxiety disorder   Other chronic pain   Ogilvie syndrome   Gram-negative infection   Acute UTI   Urinary tract infection due to ESBL Klebsiella   Consultants: Neurology General surgery Interventional radiology PCCM  Procedures: 2D echocardiogram Multiple EEGs Cortrak  placement PEG tube placement  Antibiotics: Azithromycin 8/2 through 8/3 Cefepime 8/2 through 8/3 Ceftriaxone 8/3 through 8/7 Vancomycin 8/2 x 1 dose Zosyn 8/18 x 1 Unasyn 8/19 through 8/21 Cefazolin 8/24 through 8/24 Fluconazole 8/25 through 9/3 Zosyn 8/25 x 1 dose Cefepime 9/1 x 1 dose Vancomycin 9/1 x 1 dose Azithromycin 9/8 through 9/12 Zosyn 9/12 x 1 dose Meropenem 9/13 through 9/20   Time spent: 15 minutes    Junious Silk ANP  [facility] 7 am - 330 pm/M-F for direct patient care and secure chat Please refer to Amion for contact info 71  days

## 2021-07-10 DIAGNOSIS — Z930 Tracheostomy status: Secondary | ICD-10-CM | POA: Diagnosis not present

## 2021-07-10 DIAGNOSIS — K5981 Ogilvie syndrome: Secondary | ICD-10-CM | POA: Diagnosis not present

## 2021-07-10 DIAGNOSIS — I469 Cardiac arrest, cause unspecified: Secondary | ICD-10-CM | POA: Diagnosis not present

## 2021-07-10 DIAGNOSIS — R403 Persistent vegetative state: Secondary | ICD-10-CM | POA: Diagnosis not present

## 2021-07-10 LAB — CBC WITH DIFFERENTIAL/PLATELET
Abs Immature Granulocytes: 0.1 10*3/uL — ABNORMAL HIGH (ref 0.00–0.07)
Basophils Absolute: 0.1 10*3/uL (ref 0.0–0.1)
Basophils Relative: 1 %
Eosinophils Absolute: 0.2 10*3/uL (ref 0.0–0.5)
Eosinophils Relative: 2 %
HCT: 39.5 % (ref 39.0–52.0)
Hemoglobin: 12.8 g/dL — ABNORMAL LOW (ref 13.0–17.0)
Immature Granulocytes: 1 %
Lymphocytes Relative: 30 %
Lymphs Abs: 3.3 10*3/uL (ref 0.7–4.0)
MCH: 28.5 pg (ref 26.0–34.0)
MCHC: 32.4 g/dL (ref 30.0–36.0)
MCV: 88 fL (ref 80.0–100.0)
Monocytes Absolute: 1.2 10*3/uL — ABNORMAL HIGH (ref 0.1–1.0)
Monocytes Relative: 11 %
Neutro Abs: 6 10*3/uL (ref 1.7–7.7)
Neutrophils Relative %: 55 %
Platelets: 368 10*3/uL (ref 150–400)
RBC: 4.49 MIL/uL (ref 4.22–5.81)
RDW: 15.8 % — ABNORMAL HIGH (ref 11.5–15.5)
WBC: 10.8 10*3/uL — ABNORMAL HIGH (ref 4.0–10.5)
nRBC: 0 % (ref 0.0–0.2)

## 2021-07-10 LAB — COMPREHENSIVE METABOLIC PANEL
ALT: 100 U/L — ABNORMAL HIGH (ref 0–44)
AST: 35 U/L (ref 15–41)
Albumin: 3.6 g/dL (ref 3.5–5.0)
Alkaline Phosphatase: 60 U/L (ref 38–126)
Anion gap: 9 (ref 5–15)
BUN: 17 mg/dL (ref 6–20)
CO2: 26 mmol/L (ref 22–32)
Calcium: 9.9 mg/dL (ref 8.9–10.3)
Chloride: 101 mmol/L (ref 98–111)
Creatinine, Ser: 0.66 mg/dL (ref 0.61–1.24)
GFR, Estimated: >60 mL/min (ref >60)
Glucose, Bld: 116 mg/dL — ABNORMAL HIGH (ref 70–99)
Potassium: 4.5 mmol/L (ref 3.5–5.1)
Sodium: 136 mmol/L (ref 135–145)
Total Bilirubin: 0.4 mg/dL (ref 0.3–1.2)
Total Protein: 7.3 g/dL (ref 6.5–8.1)

## 2021-07-10 LAB — GLUCOSE, CAPILLARY
Glucose-Capillary: 120 mg/dL — ABNORMAL HIGH (ref 70–99)
Glucose-Capillary: 121 mg/dL — ABNORMAL HIGH (ref 70–99)
Glucose-Capillary: 124 mg/dL — ABNORMAL HIGH (ref 70–99)
Glucose-Capillary: 128 mg/dL — ABNORMAL HIGH (ref 70–99)
Glucose-Capillary: 151 mg/dL — ABNORMAL HIGH (ref 70–99)
Glucose-Capillary: 164 mg/dL — ABNORMAL HIGH (ref 70–99)

## 2021-07-10 MED ORDER — MAGNESIUM SULFATE IN D5W 1-5 GM/100ML-% IV SOLN
1.0000 g | Freq: Once | INTRAVENOUS | Status: AC
  Administered 2021-07-10: 1 g via INTRAVENOUS
  Filled 2021-07-10: qty 100

## 2021-07-10 NOTE — Plan of Care (Signed)
?  Problem: Clinical Measurements: ?Goal: Will remain free from infection ?Outcome: Progressing ?Goal: Diagnostic test results will improve ?Outcome: Progressing ?Goal: Respiratory complications will improve ?Outcome: Progressing ?  ?

## 2021-07-10 NOTE — Progress Notes (Signed)
[facility] PROGRESS NOTE  Vincent [name] ZHG:[id number] DOB: [birth year] DOA: 04/29/2021  Patient goes by Vincent [name]   PCP: Pcp, No  Status: Remains inpatient appropriate because:Altered mental status and Unsafe d/c plan  Dispo: The patient is from: Home              Anticipated d/c is to: SNF-trach capable              Patient currently is medically stable to d/c.  Difficult to place patient Yes               Barriers to discharge: Medicaid funding-availability of trach capable facilities  Level of care: Progressive   Code Status: DNR Family Communication: Sister [name] on 10/5.  She is hopeful that he will get a bed at [facility] since that is only 45 minutes from her home DVT prophylaxis: Subcutaneous heparin COVID vaccination status: Unknown  HPI:  50 y.o. 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  He has had repetitive blinking concerning for seizure activity but EEG has been negative.  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  Since admission he has been treated for sepsis secondary to Blood culture showed Klebsiella and MSSA, he did complete a course of antibiotics.  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 Goals of care discussion held by palliative care team.  Patient's sister was adamant about continuing full scope of treatment although patient does remain a DNR   Subjective: Sleeping soundly and only briefly roused without opening his eyes.  No acute distress noting is normotensive and has normal pulse oximetry  Objective: Vitals:   07/10/21 0400 07/10/21 0530  BP: 116/83   Pulse: (!) 101 99   Resp: (!) 25 (!) 25  Temp: 98.6 F (37 C)   SpO2: 98% 96%    Intake/Output Summary (Last 24 hours) at 07/10/2021 0739 Last data filed at 07/10/2021 0540 Gross per 24 hour  Intake 1605 ml  Output 1350 ml  Net 255 ml   Filed Weights   07/07/21 0500 07/09/21 0401 07/10/21 0500  Weight: 62.7 kg 62.7 kg 63.6 kg    Exam:  Constitutional: Sleeping soundly, no apparent distress. Respiratory: #6.0 cuffed Shiley trach, stable on room air via TC.  Lung sounds to auscultation bilaterally, no increased work of breathing, pulse ox 100% Cardiovascular: S1-S2, no peripheral edema, normotensive, stable tachycardia, extremities warm and dry Abdomen: Nontender soft and chronically distended, active bowel sounds, G-tube site unremarkable, LBM 10/10 Neurologic: CN 2-12 grossly intact, currently asleep; at baseline tracks persons in room, occasionally smiles, occasionally has purposeful but dysmetric movements of upper extremities Psychiatric: Sleeping soundly.  Significant Events: 8/2 PEA Arrest, King airway in the field, CPR x6 minutes and epi x1, ROSC, Intubated in ED, Admitted to [facility]  8/24 PEG placed 8/4 Weaned off TTM, Coox O2 satof 47%. 8/8 Post pyloric Cortrak placed, myoclonic jerking, started Keppra, depakote 8/10 Completed antibiotic course for pneumonia. Family meeting transitioned to DNR. 8/15 Tracheostomy  8/18 Febrile overnight, started Zosyn for aspiration pneumonia 8/22 Completed Abx course for aspiration pneumonia  8/24 PEG placed 8/31 PSV wean 2 hours 9/1 Stopped lasix, farxiga, losartan, spiro with AKI. Ongoing  jerking, diaphoresis. WBC increased, cultured / abx started empirically.  PCT increased. CK 2,450 9/2 start scheduled opiates for possible opiate withdrawal symptoms 9/4 add flexeril, on PSV.  Versed x1 overnight. 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: Continue Keppra-no further seizure activity documented Has intermittent episodes of lethargy alternating with alertness and interaction  Acute respiratory failure with hypoxia in the setting of cardiac arrest and aspiration pneumonia: Trach team following prn --due to PVS not a candidate for decannulation  Hypothermia/altered mental status 2/2 ESBL Klebsiella UTI Sepsis work-up negative-s/p 5 days of meropenem  Suspected anxiety/pain Continue Tylenol prn, Oxy IR 2.5 mg prn and scheduled Klonopin  Injected sclera bilaterally Continue Zaditor eyedrops  Sepsis due to ESBL Klebsiella tracheobronchitis: Resolved   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  Hypovolemic hyponatremia: Resolved.    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  Nutrition: Status post PEG tube placement continue tube feedings and free water flushes.  Left upper lung nodule: Will need a follow-up CT in 3 months.  Hyperlipidemia: Continue Lipitor.   Goals of care: PVS-guarded prognosis -palliative care was consulted sister adamant about continuing full scope of treatment.         Data Reviewed: Basic Metabolic Panel: Recent Labs  Lab 07/05/21 0843 07/06/21 0221 07/07/21 0206 07/08/21 0758  NA 132* 132* 134* 135  K 5.0 4.3 4.3 4.1  CL 98 96* 98 100  CO2 24 25 26 25   GLUCOSE 113* 148* 136* 128*  BUN 20 21* 22* 20  CREATININE 0.60* 0.65 0.63 0.64  CALCIUM 10.2 10.1 10.1 10.0  MG  --  1.9  --   --    Liver Function Tests: Recent Labs  Lab 07/06/21 0221  AST 29  ALT 108*  ALKPHOS 61  BILITOT 0.6  PROT 7.1   ALBUMIN 3.5    CBC: Recent Labs  Lab 07/05/21 0843 07/06/21 0221 07/07/21 0206 07/08/21 0758  WBC 9.4 10.1 10.6* 10.4  NEUTROABS 4.8  --   --  5.1  HGB 13.6 13.6 13.1 12.9*  HCT 41.0 41.2 40.0 39.3  MCV 87.0 87.7 87.5 87.3  PLT 401* 408* 405* 431*    BNP (last 3 results) Recent Labs    04/29/21 2048 06/09/21 0350  BNP 626.7* 406.5*    CBG: Recent Labs  Lab 07/09/21 1634 07/09/21 2024 07/10/21 0042 07/10/21 0359 07/10/21 0731  GLUCAP 120* 117* 120* 151* 124*       Scheduled Meds:  arformoterol  15 mcg Nebulization BID   atorvastatin  40 mg Per Tube QHS   budesonide (PULMICORT) nebulizer solution  0.5 mg Nebulization BID   chlorhexidine  15 mL Mouth Rinse BID   clonazePAM  0.5 mg Per Tube BID   docusate  200 mg Per Tube BID   feeding supplement (PROSource TF)  45 mL Per Tube TID   fiber  1 packet Per Tube BID   free water  200 mL Per Tube Q6H   heparin  5,000 Units Subcutaneous Q8H   ketotifen  1 drop Both Eyes BID   levETIRAcetam  1,000 mg Per Tube BID   mouth rinse  15 mL Mouth Rinse q12n4p   metoprolol tartrate  100 mg Per Tube BID   pantoprazole sodium  40 mg Per Tube QHS   polyethylene glycol  17 g Per Tube BID   polyvinyl alcohol  2 drop Both Eyes Q6H   revefenacin  175 mcg Nebulization Daily   senna  1 tablet Per Tube BID   Continuous Infusions:  sodium chloride 250 mL (06/15/21 1719)   feeding supplement (JEVITY 1.5 CAL/FIBER) 1,000 mL (07/10/21 0035)    Active Problems:   Cardiac arrest (HCC)   Acute respiratory failure with hypoxia (HCC)   Seizure-like activity (HCC)   Anoxic encephalopathy (HCC)   Tracheostomy in place [facility])   Goals of care, counseling/discussion   AKI (acute kidney injury) (HCC)   Tracheostomy dependence (HCC)   Persistent vegetative state (HCC)   Generalized anxiety disorder   Other chronic pain   Ogilvie syndrome   Gram-negative infection   Acute UTI   Urinary tract infection due to ESBL  Klebsiella   Consultants: Neurology General surgery Interventional radiology PCCM  Procedures: 2D echocardiogram Multiple EEGs Cortrak placement PEG tube placement  Antibiotics: Azithromycin 8/2 through 8/3 Cefepime 8/2 through 8/3 Ceftriaxone 8/3 through 8/7 Vancomycin 8/2 x 1 dose Zosyn 8/18 x 1 Unasyn 8/19 through 8/21 Cefazolin 8/24 through 8/24 Fluconazole 8/25 through 9/3 Zosyn 8/25 x 1 dose Cefepime 9/1 x 1 dose Vancomycin 9/1 x 1 dose Azithromycin 9/8 through 9/12 Zosyn 9/12 x 1 dose Meropenem 9/13 through 9/20   Time spent: 15 minutes    Junious Silk ANP  [facility] 7 am - 330 pm/M-F for direct patient care and secure chat Please refer to Amion for contact info 72  days

## 2021-07-10 NOTE — TOC Progression Note (Signed)
Transition of Care [facility]) - Progression Note    Patient Details  Name: Vincent [name] MRN: [id number] Date of Birth: 10-14-1970  Transition of Care [facility]) CM/SW Contact  Janae Bridgeman, RN Phone Number: 07/10/2021, 7:35 AM  Clinical Narrative:    Case management spoke with Vincenza Hews, CM with [facility] facilities and they were unable to offer an available bed to the patient due to complex medical needs involving care of the tracheostomy at the facilities.  CM and MSW with DTP Team will continue to explore SNF options in the state for care.   Expected Discharge Plan: Skilled Nursing Facility Barriers to Discharge: Continued Medical Work up, No SNF bed  Expected Discharge Plan and Services Expected Discharge Plan: Skilled Nursing Facility In-house Referral: Clinical Social Work Discharge Planning Services: CM Consult Post Acute Care Choice: Skilled Nursing Facility Living arrangements for the past 2 months: Single Family Home                                       Social Determinants of Health (SDOH) Interventions    Readmission Risk Interventions No flowsheet data found.

## 2021-07-11 ENCOUNTER — Inpatient Hospital Stay (HOSPITAL_COMMUNITY): Payer: Medicaid Other

## 2021-07-11 DIAGNOSIS — Z930 Tracheostomy status: Secondary | ICD-10-CM | POA: Diagnosis not present

## 2021-07-11 DIAGNOSIS — R403 Persistent vegetative state: Secondary | ICD-10-CM | POA: Diagnosis not present

## 2021-07-11 DIAGNOSIS — K5981 Ogilvie syndrome: Secondary | ICD-10-CM | POA: Diagnosis not present

## 2021-07-11 DIAGNOSIS — I469 Cardiac arrest, cause unspecified: Secondary | ICD-10-CM | POA: Diagnosis not present

## 2021-07-11 LAB — BASIC METABOLIC PANEL
Anion gap: 9 (ref 5–15)
BUN: 17 mg/dL (ref 6–20)
CO2: 27 mmol/L (ref 22–32)
Calcium: 9.7 mg/dL (ref 8.9–10.3)
Chloride: 97 mmol/L — ABNORMAL LOW (ref 98–111)
Creatinine, Ser: 0.7 mg/dL (ref 0.61–1.24)
GFR, Estimated: >60 mL/min (ref >60)
Glucose, Bld: 155 mg/dL — ABNORMAL HIGH (ref 70–99)
Potassium: 4.5 mmol/L (ref 3.5–5.1)
Sodium: 133 mmol/L — ABNORMAL LOW (ref 135–145)

## 2021-07-11 LAB — CBC
HCT: 40 % (ref 39.0–52.0)
Hemoglobin: 13.2 g/dL (ref 13.0–17.0)
MCH: 29.1 pg (ref 26.0–34.0)
MCHC: 33 g/dL (ref 30.0–36.0)
MCV: 88.3 fL (ref 80.0–100.0)
Platelets: 387 10*3/uL (ref 150–400)
RBC: 4.53 MIL/uL (ref 4.22–5.81)
RDW: 15.7 % — ABNORMAL HIGH (ref 11.5–15.5)
WBC: 11.2 10*3/uL — ABNORMAL HIGH (ref 4.0–10.5)
nRBC: 0 % (ref 0.0–0.2)

## 2021-07-11 LAB — URINALYSIS, ROUTINE W REFLEX MICROSCOPIC
Bilirubin Urine: NEGATIVE
Glucose, UA: NEGATIVE mg/dL
Hgb urine dipstick: NEGATIVE
Ketones, ur: NEGATIVE mg/dL
Leukocytes,Ua: NEGATIVE
Nitrite: NEGATIVE
Protein, ur: NEGATIVE mg/dL
Specific Gravity, Urine: 1.017 (ref 1.005–1.030)
pH: 8 (ref 5.0–8.0)

## 2021-07-11 LAB — PROCALCITONIN: Procalcitonin: <0.1 ng/mL

## 2021-07-11 LAB — GLUCOSE, CAPILLARY
Glucose-Capillary: 105 mg/dL — ABNORMAL HIGH (ref 70–99)
Glucose-Capillary: 115 mg/dL — ABNORMAL HIGH (ref 70–99)
Glucose-Capillary: 124 mg/dL — ABNORMAL HIGH (ref 70–99)
Glucose-Capillary: 132 mg/dL — ABNORMAL HIGH (ref 70–99)
Glucose-Capillary: 137 mg/dL — ABNORMAL HIGH (ref 70–99)
Glucose-Capillary: 148 mg/dL — ABNORMAL HIGH (ref 70–99)
Glucose-Capillary: 152 mg/dL — ABNORMAL HIGH (ref 70–99)

## 2021-07-11 LAB — LACTIC ACID, PLASMA: Lactic Acid, Venous: 1.8 mmol/L (ref 0.5–1.9)

## 2021-07-11 LAB — C-REACTIVE PROTEIN: CRP: 1.2 mg/dL — ABNORMAL HIGH (ref <1.0)

## 2021-07-11 MED ORDER — SODIUM CHLORIDE 0.9 % IV SOLN
3.0000 g | Freq: Four times a day (QID) | INTRAVENOUS | Status: DC
  Administered 2021-07-11 – 2021-07-12 (×4): 3 g via INTRAVENOUS
  Filled 2021-07-11 (×5): qty 8

## 2021-07-11 NOTE — Progress Notes (Addendum)
[facility] PROGRESS NOTE  Janelle Velmer Woelfel XNA:[id number] DOB: [birth year] DOA: 04/29/2021  Patient goes by Vincent [name]   PCP: Pcp, No  Status: Remains inpatient appropriate because:Altered mental status and Unsafe d/c plan  Dispo: The patient is from: Home              Anticipated d/c is to: SNF-trach capable              Patient currently is medically stable to d/c.  Difficult to place patient Yes               Barriers to discharge: Medicaid funding-availability of trach capable facilities  Level of care: Progressive   Code Status: DNR Family Communication: Sister [name] on 10/5.  She is hopeful that he will get a bed at [facility] since that is only 45 minutes from her home DVT prophylaxis: Subcutaneous heparin COVID vaccination status: Unknown  HPI:  50 y.o. 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  He has had repetitive blinking concerning for seizure activity but EEG has been negative.  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  Since admission he has been treated for sepsis secondary to Blood culture showed Klebsiella and MSSA, he did complete a course of antibiotics.  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 Goals of care discussion held by palliative care team.  Patient's sister was adamant about continuing full scope of treatment although patient does remain a DNR   Subjective: Patient continues to remain more lethargic than baseline.  When I returned later to reevaluate him his eyes and he briefly tracked my speech and movement in the room.  Objective: Vitals:   07/11/21 0327 07/11/21 0400  BP:  102/64   Pulse: (!) 105   Resp: (!) 21   Temp: 99 F (37.2 C)   SpO2: 97% 97%    Intake/Output Summary (Last 24 hours) at 07/11/2021 0737 Last data filed at 07/11/2021 0617 Gross per 24 hour  Intake 1950 ml  Output 900 ml  Net 1050 ml   Filed Weights   07/09/21 0401 07/10/21 0500 07/11/21 0500  Weight: 62.7 kg 63.6 kg 65.2 kg    Exam:  Constitutional: In no acute distress, remains more lethargic than baseline Respiratory: #6.0 cuffed Shiley trach, stable on room air via TC.  Lung sounds remain coarse to auscultation bilaterally, no increased work of breathing, pulse ox 98% Cardiovascular: S1-S2, no peripheral edema, normotensive, stable tachycardia, extremities warm and dry Abdomen: Nontender, soft and chronically distended, active bowel sounds, G-tube site unremarkable, LBM 10/13 Neurologic: CN 2-12 grossly intact, currently asleep; at baseline tracks persons in room, occasionally smiles, occasionally has purposeful but dysmetric movements of upper extremities Psychiatric: Sleeping soundly.  Significant Events: 8/2 PEA Arrest, King airway in the field, CPR x6 minutes and epi x1, ROSC, Intubated in ED, Admitted to [facility]  8/24 PEG placed 8/4 Weaned off TTM, Coox O2 satof 47%. 8/8 Post pyloric Cortrak placed, myoclonic jerking, started Keppra, depakote 8/10 Completed antibiotic course for pneumonia. Family meeting transitioned to DNR. 8/15 Tracheostomy  8/18 Febrile overnight, started Zosyn for aspiration pneumonia 8/22 Completed Abx course for aspiration pneumonia  8/24 PEG placed 8/31 PSV  wean 2 hours 9/1 Stopped lasix, farxiga, losartan, spiro with AKI. Ongoing jerking, diaphoresis. WBC increased, cultured / abx started empirically.  PCT increased. CK 2,450 9/2 start scheduled opiates for possible opiate withdrawal symptoms 9/4 add flexeril, on PSV.  Versed x1 overnight. 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: Continue Keppra-no further seizure activity documented Has intermittent episodes of lethargy alternating with alertness and interaction  Acute respiratory failure with hypoxia in the setting of cardiac arrest and aspiration pneumonia: Trach team following prn --due to PVS not a candidate for decannulation  Fever with metabolic encephalopathy Infectious work-up initiated; urinalysis not consistent with infection, PIV site unremarkable, cultures pending and urine culture pending Chest x-ray with left lower lobe pneumonia-given history of MDR ESBL Klebsiella pneumonia previously will discuss antibiotic choice with physician-obtain tracheal aspirate for culture and begin empiric Unasyn  Hypothermia/altered mental status 2/2 ESBL Klebsiella UTI Sepsis work-up negative-s/p 5 days of meropenem  Suspected anxiety/pain Continue Tylenol prn, Oxy IR 2.5 mg prn and scheduled Klonopin  Injected sclera bilaterally Continue Zaditor eyedrops  Sepsis due to ESBL Klebsiella tracheobronchitis: Resolved   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  Hypovolemic hyponatremia: Resolved.    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  Nutrition: Status post PEG tube placement continue tube feedings and free water flushes.  Left upper lung nodule: Will need a follow-up CT in 3 months.  Hyperlipidemia: Continue Lipitor.   Goals of care: PVS-guarded prognosis -palliative care was consulted sister adamant about continuing full scope of treatment.      Data  Reviewed: Basic Metabolic Panel: Recent Labs  Lab 07/05/21 0843 07/06/21 0221 07/07/21 0206 07/08/21 0758 07/10/21 0902  NA 132* 132* 134* 135 136  K 5.0 4.3 4.3 4.1 4.5  CL 98 96* 98 100 101  CO2 24 25 26 25 26   GLUCOSE 113* 148* 136* 128* 116*  BUN 20 21* 22* 20 17  CREATININE 0.60* 0.65 0.63 0.64 0.66  CALCIUM 10.2 10.1 10.1 10.0 9.9  MG  --  1.9  --   --   --    Liver Function Tests: Recent Labs  Lab 07/06/21 0221 07/10/21 0902  AST 29 35  ALT 108* 100*  ALKPHOS 61 60  BILITOT 0.6 0.4  PROT 7.1 7.3  ALBUMIN 3.5 3.6    CBC: Recent Labs  Lab 07/05/21 0843 07/06/21 0221 07/07/21 0206 07/08/21 0758 07/10/21 0902  WBC 9.4 10.1 10.6* 10.4 10.8*  NEUTROABS 4.8  --   --  5.1 6.0  HGB 13.6 13.6 13.1 12.9* 12.8*  HCT 41.0 41.2 40.0 39.3 39.5  MCV 87.0 87.7 87.5 87.3 88.0  PLT 401* 408* 405* 431* 368    BNP (last 3 results) Recent Labs    04/29/21 2048 06/09/21 0350  BNP 626.7* 406.5*    CBG: Recent Labs  Lab 07/10/21 1133 07/10/21 1631 07/10/21 2109 07/11/21 0025 07/11/21 0327  GLUCAP 121* 164* 128* 115* 148*       Scheduled Meds:  arformoterol  15 mcg Nebulization BID   atorvastatin  40 mg Per Tube QHS   budesonide (PULMICORT) nebulizer solution  0.5 mg Nebulization BID  chlorhexidine  15 mL Mouth Rinse BID   clonazePAM  0.5 mg Per Tube BID   docusate  200 mg Per Tube BID   feeding supplement (PROSource TF)  45 mL Per Tube TID   fiber  1 packet Per Tube BID   free water  200 mL Per Tube Q6H   heparin  5,000 Units Subcutaneous Q8H   ketotifen  1 drop Both Eyes BID   levETIRAcetam  1,000 mg Per Tube BID   mouth rinse  15 mL Mouth Rinse q12n4p   metoprolol tartrate  100 mg Per Tube BID   pantoprazole sodium  40 mg Per Tube QHS   polyethylene glycol  17 g Per Tube BID   polyvinyl alcohol  2 drop Both Eyes Q6H   revefenacin  175 mcg Nebulization Daily   senna  1 tablet Per Tube BID   Continuous Infusions:  sodium chloride 250 mL  (06/15/21 1719)   feeding supplement (JEVITY 1.5 CAL/FIBER) 1,000 mL (07/10/21 1610)    Active Problems:   Cardiac arrest (HCC)   Acute respiratory failure with hypoxia (HCC)   Seizure-like activity (HCC)   Anoxic encephalopathy (HCC)   Tracheostomy in place [facility])   Goals of care, counseling/discussion   AKI (acute kidney injury) (HCC)   Tracheostomy dependence (HCC)   Persistent vegetative state (HCC)   Generalized anxiety disorder   Other chronic pain   Ogilvie syndrome   Gram-negative infection   Acute UTI   Urinary tract infection due to ESBL Klebsiella   Consultants: Neurology General surgery Interventional radiology PCCM  Procedures: 2D echocardiogram Multiple EEGs Cortrak placement PEG tube placement  Antibiotics: Azithromycin 8/2 through 8/3 Cefepime 8/2 through 8/3 Ceftriaxone 8/3 through 8/7 Vancomycin 8/2 x 1 dose Zosyn 8/18 x 1 Unasyn 8/19 through 8/21 Cefazolin 8/24 through 8/24 Fluconazole 8/25 through 9/3 Zosyn 8/25 x 1 dose Cefepime 9/1 x 1 dose Vancomycin 9/1 x 1 dose Azithromycin 9/8 through 9/12 Zosyn 9/12 x 1 dose Meropenem 9/13 through 9/20   Time spent: 15 minutes    Junious Silk ANP  [facility] 7 am - 330 pm/M-F for direct patient care and secure chat Please refer to Amion for contact info 73  days

## 2021-07-11 NOTE — Plan of Care (Signed)
  Problem: Role Relationship: Goal: Method of communication will improve Outcome: Progressing   Problem: Clinical Measurements: Goal: Diagnostic test results will improve Outcome: Progressing Goal: Respiratory complications will improve Outcome: Progressing

## 2021-07-11 NOTE — Progress Notes (Signed)
Tracheal aspirate collected and sent to lab. 

## 2021-07-11 NOTE — Progress Notes (Signed)
CSW attempted to reach admissions at [facility] in [place] [employer] without success - CSW left voicemail for [employer], Production designer, theatre[employer]television[employer]film requesting a return call.  Edwin Dada, MSW, LCSW Transitions of Care  Clinical Social Worker II [phone]

## 2021-07-12 LAB — CBC WITH DIFFERENTIAL/PLATELET
Abs Immature Granulocytes: 0.19 10*3/uL — ABNORMAL HIGH (ref 0.00–0.07)
Basophils Absolute: 0.1 10*3/uL (ref 0.0–0.1)
Basophils Relative: 1 %
Eosinophils Absolute: 0.2 10*3/uL (ref 0.0–0.5)
Eosinophils Relative: 2 %
HCT: 41.2 % (ref 39.0–52.0)
Hemoglobin: 13.7 g/dL (ref 13.0–17.0)
Immature Granulocytes: 2 %
Lymphocytes Relative: 32 %
Lymphs Abs: 3.9 10*3/uL (ref 0.7–4.0)
MCH: 29.1 pg (ref 26.0–34.0)
MCHC: 33.3 g/dL (ref 30.0–36.0)
MCV: 87.5 fL (ref 80.0–100.0)
Monocytes Absolute: 1.1 10*3/uL — ABNORMAL HIGH (ref 0.1–1.0)
Monocytes Relative: 9 %
Neutro Abs: 6.9 10*3/uL (ref 1.7–7.7)
Neutrophils Relative %: 54 %
Platelets: 431 10*3/uL — ABNORMAL HIGH (ref 150–400)
RBC: 4.71 MIL/uL (ref 4.22–5.81)
RDW: 15.6 % — ABNORMAL HIGH (ref 11.5–15.5)
WBC: 12.3 10*3/uL — ABNORMAL HIGH (ref 4.0–10.5)
nRBC: 0 % (ref 0.0–0.2)

## 2021-07-12 LAB — BLOOD CULTURE ID PANEL (REFLEXED) - BCID2

## 2021-07-12 LAB — BASIC METABOLIC PANEL
Anion gap: 11 (ref 5–15)
BUN: 16 mg/dL (ref 6–20)
CO2: 23 mmol/L (ref 22–32)
Calcium: 9.8 mg/dL (ref 8.9–10.3)
Chloride: 98 mmol/L (ref 98–111)
Creatinine, Ser: 0.64 mg/dL (ref 0.61–1.24)
GFR, Estimated: >60 mL/min (ref >60)
Glucose, Bld: 129 mg/dL — ABNORMAL HIGH (ref 70–99)
Potassium: 4.3 mmol/L (ref 3.5–5.1)
Sodium: 132 mmol/L — ABNORMAL LOW (ref 135–145)

## 2021-07-12 LAB — GLUCOSE, CAPILLARY
Glucose-Capillary: 116 mg/dL — ABNORMAL HIGH (ref 70–99)
Glucose-Capillary: 127 mg/dL — ABNORMAL HIGH (ref 70–99)
Glucose-Capillary: 128 mg/dL — ABNORMAL HIGH (ref 70–99)
Glucose-Capillary: 135 mg/dL — ABNORMAL HIGH (ref 70–99)
Glucose-Capillary: 146 mg/dL — ABNORMAL HIGH (ref 70–99)
Glucose-Capillary: 147 mg/dL — ABNORMAL HIGH (ref 70–99)

## 2021-07-12 LAB — URINE CULTURE

## 2021-07-12 LAB — PROCALCITONIN: Procalcitonin: <0.1 ng/mL

## 2021-07-12 LAB — C-REACTIVE PROTEIN: CRP: 1.4 mg/dL — ABNORMAL HIGH (ref <1.0)

## 2021-07-12 MED ORDER — LACTATED RINGERS IV SOLN: INTRAVENOUS | Status: AC

## 2021-07-12 MED ORDER — VANCOMYCIN HCL 1500 MG/300ML IV SOLN
1500.0000 mg | Freq: Once | INTRAVENOUS | Status: AC
  Administered 2021-07-12: 1500 mg via INTRAVENOUS
  Filled 2021-07-12: qty 300

## 2021-07-12 MED ORDER — LISINOPRIL 5 MG PO TABS
2.5000 mg | ORAL_TABLET | Freq: Every day | ORAL | Status: DC
  Administered 2021-07-12 – 2021-07-22 (×11): 2.5 mg
  Filled 2021-07-12 (×11): qty 1

## 2021-07-12 MED ORDER — DOXYCYCLINE HYCLATE 100 MG PO TABS
100.0000 mg | ORAL_TABLET | Freq: Two times a day (BID) | ORAL | Status: DC
Start: 2021-07-12 — End: 2021-07-12

## 2021-07-12 MED ORDER — PIPERACILLIN-TAZOBACTAM 3.375 G IVPB
3.3750 g | Freq: Three times a day (TID) | INTRAVENOUS | Status: DC
  Administered 2021-07-12 – 2021-07-13 (×3): 3.375 g via INTRAVENOUS
  Filled 2021-07-12 (×3): qty 50

## 2021-07-12 MED ORDER — VANCOMYCIN HCL IN DEXTROSE 1-5 GM/200ML-% IV SOLN
1000.0000 mg | Freq: Two times a day (BID) | INTRAVENOUS | Status: DC
  Administered 2021-07-13 – 2021-07-15 (×6): 1000 mg via INTRAVENOUS
  Filled 2021-07-12 (×8): qty 200

## 2021-07-12 MED ORDER — DOXYCYCLINE HYCLATE 100 MG IV SOLR
100.0000 mg | Freq: Two times a day (BID) | INTRAVENOUS | Status: DC
Start: 2021-07-12 — End: 2021-07-12
  Filled 2021-07-12: qty 100

## 2021-07-12 NOTE — Progress Notes (Signed)
Pharmacy Antibiotic Note  Vincent [name] is a 50 y.o. male admitted on 04/29/2021 with S. epidermidis bacteremia.  Pharmacy has been consulted for vancomycin dosing.  AUC goal: 400-550  Plan: Vancomycin load 1500mg  x1  Than Vancomycin 1000mg  q 12 h (eAUC 530) Will obtain levels as needed F/u cultures, renal function and LOT  Height: 5\' 8"  (172.7 cm) Weight: 62.2 kg (137 lb 2 oz) IBW/kg (Calculated) : 68.4  Temp (24hrs), Avg:98.9 F (37.2 C), Min:98.1 F (36.7 C), Max:99.4 F (37.4 C)  Recent Labs  Lab 07/07/21 0206 07/08/21 0758 07/10/21 0902 07/11/21 0728 07/11/21 0738 07/12/21 0256  WBC 10.6* 10.4 10.8* 11.2*  --  12.3*  CREATININE 0.63 0.64 0.66 0.70  --  0.64  LATICACIDVEN  --   --   --   --  1.8  --     Estimated Creatinine Clearance: 96.1 mL/min (by C-G formula based on SCr of 0.64 mg/dL).    No Known Allergies  Antimicrobials this admission: CTX 8/3>>8/7 Augmentin 8/8 >> 8/9 Zosyn 8/18>> 8/19; 8/25>>8/26; 9/12 >> 9/13, 10/15 > Unasyn 8/19 >>8/22, 10/14 >10/15 Fluconazole 8/25>>9/3 Vanc 9/1 x1, 10/15 > Cefepime 9/1 x1 Azithromycin 9/8>>9/12 Meropenem 9/13>>20, 9/30> 10/4  Microbiology results: 8/2 MRSA PCR negative 8/2 COVID/Flu negative 8/18 TA >> few staph epi (ox resistant) - likely contaminant 9/1 resp cx: few ESBL Klebsiella pneumoniae, few staph aureus (s aureus pan-S, Klebsiella S to zosyn and imipenem) - Dr 10/2 aware, not wanting to treat with infectious status improving clinically 9/1 bcx: ngF 9/1 &12: trach- Kleb Pn (ESBL) and MSSA 9/13 Bcx: NGeg 9/29 urine: ESBL kleb pneumo; sensitive to imipenem, pip/tazo. Resistant to Bactrim/FQs  10/14 BCID: staph epi with mecA 10/14 GPC, pending  Thank you for allowing pharmacy to be a part of this patient's care.  10/29 07/12/2021 10:53 AM

## 2021-07-12 NOTE — Progress Notes (Addendum)
[facility] PROGRESS NOTE  Vincent [name] KXF:[id number] DOB: 1971-09-01 DOA: 04/29/2021  Patient goes by Vincent [name]   PCP: Pcp, No  Status: Remains inpatient appropriate because:Altered mental status and Unsafe d/c plan  Dispo: The patient is from: Home              Anticipated d/c is to: SNF-trach capable              Patient currently is medically stable to d/c.  Difficult to place patient Yes               Barriers to discharge: Medicaid funding-availability of trach capable facilities  Level of care: Progressive   Code Status: DNR Family Communication: Sister Vincent [name] on 10/5.  She is hopeful that he will get a bed at [facility] since that is only 45 minutes from her home DVT prophylaxis: Subcutaneous heparin COVID vaccination status: Unknown  HPI:  50 y.o. 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  He has had repetitive blinking concerning for seizure activity but EEG has been negative.  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  Since admission he has been treated for sepsis secondary to Blood culture showed Klebsiella and MSSA, he did complete a course of antibiotics.  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 Goals of care discussion held by palliative care team.  Patient's sister was adamant about continuing full scope of treatment although patient does remain a DNR   Subjective:  In bed more awake alert today, appears to be in no distress, unable to answer questions or follow commands.  Objective: Vitals:   07/12/21 0600 07/12/21 0740  BP:  131/87  Pulse: (!) 106 (!) 116  Resp: 17 17  Temp:  99.1 F  (37.3 C)  SpO2: 100% 100%    Intake/Output Summary (Last 24 hours) at 07/12/2021 0906 Last data filed at 07/12/2021 0630 Gross per 24 hour  Intake 3370.07 ml  Output 1201 ml  Net 2169.07 ml   Filed Weights   07/10/21 0500 07/11/21 0500 07/12/21 0500  Weight: 63.6 kg 65.2 kg 62.2 kg    Exam:  Awake but unable to answer questions or follow commands, does track people in the room, tracheostomy tube and PEG tube in place.   [place].AT,  Supple Neck, No JVD,   Symmetrical Chest wall movement, Good air movement bilaterally, CTAB RRR,No Gallops, Rubs or new Murmurs,  +ve B.Sounds, Abd Soft, No tenderness,   No Cyanosis, Clubbing or edema,     Significant Events: 8/2 PEA Arrest, King airway in the field, CPR x6 minutes and epi x1, ROSC, Intubated in ED, Admitted to [facility]  8/24 PEG placed 8/4 Weaned off TTM, Coox O2 satof 47%. 8/8 Post pyloric Cortrak placed, myoclonic jerking, started Keppra, depakote 8/10 Completed antibiotic course for pneumonia. Family meeting transitioned to DNR. 8/15 Tracheostomy  8/18 Febrile overnight, started Zosyn for aspiration pneumonia 8/22 Completed Abx course for aspiration pneumonia  8/24 PEG placed 8/31 PSV wean 2 hours 9/1 Stopped lasix, farxiga, losartan, spiro with AKI. Ongoing jerking, diaphoresis. WBC increased, cultured / abx started empirically.  PCT increased. CK 2,450 9/2 start scheduled opiates for possible opiate withdrawal symptoms  9/4 add flexeril, on PSV.  Versed x1 overnight. 9/9 has tolerated trach collar for 48+hours 9/23 repetitive blinking with concerns for seizure activity-EEG ordered -was negative  Assessment/Plan: Acute problems:  Sepsis due to ESBL Klebsiella tracheobronchitis and history of Klebsiella UTI with sepsis in the past :  had Resolved, however febrile with decreased mental status on 07/11/2021, developing tracheobronchitis again clinically, will be placed on 5 days of Zosyn and Doxycycline starting on 07/12/2021,  note avoiding meropenem due to underlying history of seizures.  He was given a dose of Unasyn on 07/11/2021 to which he has partially responded suggesting that Unasyn should suffice.  Note his previous tracheal cultures also grew some staph aureus.  New Cultures are pending.  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  Hypovolemic hyponatremia: Resolved.    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   Opiate withdrawals:  Resolved.  Nutrition:  Status post PEG tube placement continue tube feedings and free water flushes.  Left upper lung nodule:  Will need a follow-up CT in 3 months post discharge with PCCM follow-up.  Hyperlipidemia:  Continue Lipitor.   Goals of care:  PVS-guarded prognosis -palliative care was consulted sister adamant about continuing full scope of treatment.      Data Reviewed: Basic Metabolic Panel: Recent Labs  Lab 07/06/21 0221 07/07/21 0206 07/08/21 0758 07/10/21 0902 07/11/21 0728 07/12/21 0256  NA 132* 134* 135 136 133* 132*  K 4.3 4.3 4.1 4.5 4.5 4.3  CL 96* 98 100 101 97* 98  CO2 25 26 25 26 27 23   GLUCOSE 148* 136* 128* 116*  155* 129*  BUN 21* 22* 20 17 17 16   CREATININE 0.65 0.63 0.64 0.66 0.70 0.64  CALCIUM 10.1 10.1 10.0 9.9 9.7 9.8  MG 1.9  --   --   --   --   --    Liver Function Tests: Recent Labs  Lab 07/06/21 0221 07/10/21 0902  AST 29 35  ALT 108* 100*  ALKPHOS 61 60  BILITOT 0.6 0.4  PROT 7.1 7.3  ALBUMIN 3.5 3.6    CBC: Recent Labs  Lab 07/07/21 0206 07/08/21 0758 07/10/21 0902 07/11/21 0728 07/12/21 0256  WBC 10.6* 10.4 10.8* 11.2* 12.3*  NEUTROABS  --  5.1 6.0  --  6.9  HGB 13.1 12.9* 12.8* 13.2 13.7  HCT 40.0 39.3 39.5 40.0 41.2  MCV 87.5 87.3 88.0 88.3 87.5  PLT 405* 431* 368 387 431*    BNP (last 3 results) Recent Labs    04/29/21 2048 06/09/21 0350  BNP 626.7* 406.5*    CBG: Recent Labs  Lab 07/11/21 2018 07/11/21 2336 07/12/21 0011 07/12/21 0409 07/12/21 0803  GLUCAP 124* 132* 128* 127* 116*       Scheduled Meds:  arformoterol  15 mcg Nebulization BID  atorvastatin  40 mg Per Tube QHS   budesonide (PULMICORT) nebulizer solution  0.5 mg Nebulization BID   chlorhexidine  15 mL Mouth Rinse BID   clonazePAM  0.5 mg Per Tube BID   docusate  200 mg Per Tube BID   doxycycline  100 mg Per Tube Q12H   feeding supplement (PROSource TF)  45 mL Per Tube TID   fiber  1 packet Per Tube BID   free water  200 mL Per Tube Q6H   heparin  5,000 Units Subcutaneous Q8H   ketotifen  1 drop Both Eyes BID   levETIRAcetam  1,000 mg Per Tube BID   mouth rinse  15 mL Mouth Rinse q12n4p   metoprolol tartrate  100 mg Per Tube BID   pantoprazole sodium  40 mg Per Tube QHS   polyethylene glycol  17 g Per Tube BID   polyvinyl alcohol  2 drop Both Eyes Q6H   revefenacin  175 mcg Nebulization Daily   senna  1 tablet Per Tube BID   Continuous Infusions:  sodium chloride 250 mL (06/15/21 1719)   feeding supplement (JEVITY 1.5 CAL/FIBER) 1,000 mL (07/12/21 0550)   lactated ringers 75 mL/hr at 07/12/21 0841    Active Problems:   Cardiac arrest [facility])   Acute respiratory  failure with hypoxia (HCC)   Seizure-like activity (HCC)   Anoxic encephalopathy (HCC)   Tracheostomy in place [facility])   Goals of care, counseling/discussion   AKI (acute kidney injury) (HCC)   Tracheostomy dependence (HCC)   Persistent vegetative state (HCC)   Generalized anxiety disorder   Other chronic pain   Ogilvie syndrome   Gram-negative infection   Acute UTI   Urinary tract infection due to ESBL Klebsiella   Consultants: Neurology General surgery Interventional radiology PCCM  Procedures: 2D echocardiogram Multiple EEGs Cortrak placement PEG tube placement  Antibiotics: Azithromycin 8/2 through 8/3 Cefepime 8/2 through 8/3 Ceftriaxone 8/3 through 8/7 Vancomycin 8/2 x 1 dose Zosyn 8/18 x 1 Unasyn 8/19 through 8/21 Cefazolin 8/24 through 8/24 Fluconazole 8/25 through 9/3 Zosyn 8/25 x 1 dose Cefepime 9/1 x 1 dose Vancomycin 9/1 x 1 dose Azithromycin 9/8 through 9/12 Zosyn 9/12 x 1 dose Meropenem 9/13 through 9/20 Zosyn & Doxycycline on  07/12/2021 for 5 days     Time spent: 25 minutes 74  days  Signature  Susa Raring M.D on 07/12/2021 at 9:06 AM   -  To page go to [email or web address]

## 2021-07-12 NOTE — Progress Notes (Signed)
PHARMACY - PHYSICIAN COMMUNICATION CRITICAL VALUE ALERT - BLOOD CULTURE IDENTIFICATION (BCID)  Vincent [name] is an 50 y.o. male who presented to [facility] on 04/29/2021 with a chief complaint of PEA arrest with extended hospitalization. Overnight febrile with decreased mental status.   Assessment:  1 out of 4 bottle positive for GPCs, identified as Staph epi with the presence of MEC A resistance.  Name of physician (or Provider) Contacted: Dr. Thedore Mins  Current antibiotics: Doxycycline 100mg  BID IV and Zosyn 3.375 q8h  Changes to prescribed antibiotics recommended:  Recommend stopping doxycycline and starting Vancomycin  Thank you for allowing pharmacy to participate in this patient's care.  , PharmD PGY1 Acute Care Resident  07/12/2021,10:19 AM

## 2021-07-13 LAB — CBC WITH DIFFERENTIAL/PLATELET
Abs Immature Granulocytes: 0.16 10*3/uL — ABNORMAL HIGH (ref 0.00–0.07)
Basophils Absolute: 0.1 10*3/uL (ref 0.0–0.1)
Basophils Relative: 1 %
Eosinophils Absolute: 0.2 10*3/uL (ref 0.0–0.5)
Eosinophils Relative: 2 %
HCT: 39.5 % (ref 39.0–52.0)
Hemoglobin: 12.7 g/dL — ABNORMAL LOW (ref 13.0–17.0)
Immature Granulocytes: 2 %
Lymphocytes Relative: 30 %
Lymphs Abs: 3.2 10*3/uL (ref 0.7–4.0)
MCH: 28.5 pg (ref 26.0–34.0)
MCHC: 32.2 g/dL (ref 30.0–36.0)
MCV: 88.8 fL (ref 80.0–100.0)
Monocytes Absolute: 0.9 10*3/uL (ref 0.1–1.0)
Monocytes Relative: 8 %
Neutro Abs: 6.1 10*3/uL (ref 1.7–7.7)
Neutrophils Relative %: 57 %
Platelets: 400 10*3/uL (ref 150–400)
RBC: 4.45 MIL/uL (ref 4.22–5.81)
RDW: 15.6 % — ABNORMAL HIGH (ref 11.5–15.5)
WBC: 10.6 10*3/uL — ABNORMAL HIGH (ref 4.0–10.5)
nRBC: 0 % (ref 0.0–0.2)

## 2021-07-13 LAB — BASIC METABOLIC PANEL
Anion gap: 10 (ref 5–15)
BUN: 17 mg/dL (ref 6–20)
CO2: 25 mmol/L (ref 22–32)
Calcium: 9.9 mg/dL (ref 8.9–10.3)
Chloride: 101 mmol/L (ref 98–111)
Creatinine, Ser: 0.72 mg/dL (ref 0.61–1.24)
GFR, Estimated: >60 mL/min (ref >60)
Glucose, Bld: 156 mg/dL — ABNORMAL HIGH (ref 70–99)
Potassium: 4 mmol/L (ref 3.5–5.1)
Sodium: 136 mmol/L (ref 135–145)

## 2021-07-13 LAB — GLUCOSE, CAPILLARY
Glucose-Capillary: 162 mg/dL — ABNORMAL HIGH (ref 70–99)
Glucose-Capillary: 111 mg/dL — ABNORMAL HIGH (ref 70–99)
Glucose-Capillary: 145 mg/dL — ABNORMAL HIGH (ref 70–99)
Glucose-Capillary: 116 mg/dL — ABNORMAL HIGH (ref 70–99)
Glucose-Capillary: 106 mg/dL — ABNORMAL HIGH (ref 70–99)
Glucose-Capillary: 119 mg/dL — ABNORMAL HIGH (ref 70–99)
Glucose-Capillary: 164 mg/dL — ABNORMAL HIGH (ref 70–99)

## 2021-07-13 LAB — PROCALCITONIN: Procalcitonin: <0.1 ng/mL

## 2021-07-13 MED ORDER — SODIUM CHLORIDE 0.9 % IV SOLN
1.0000 g | Freq: Three times a day (TID) | INTRAVENOUS | Status: AC
  Administered 2021-07-13 – 2021-07-18 (×15): 1 g via INTRAVENOUS
  Filled 2021-07-13 (×15): qty 1

## 2021-07-13 NOTE — Plan of Care (Signed)
°  Problem: Coping: °Goal: Level of anxiety will decrease °Outcome: Progressing °  °Problem: Elimination: °Goal: Will not experience complications related to bowel motility °Outcome: Progressing °  °Problem: Safety: °Goal: Ability to remain free from injury will improve °Outcome: Progressing °  °Problem: Skin Integrity: °Goal: Risk for impaired skin integrity will decrease °Outcome: Progressing °  °

## 2021-07-13 NOTE — Progress Notes (Addendum)
[facility] PROGRESS NOTE  Vincent [name] [id number] DOB: 1970/12/19 DOA: 04/29/2021  Patient goes by Vincent [name]   PCP: Pcp, No  Status: Remains inpatient appropriate because:Altered mental status and Unsafe d/c plan  Dispo: The patient is from: Home              Anticipated d/c is to: SNF-trach capable              Patient currently is medically stable to d/c.  Difficult to place patient Yes               Barriers to discharge: Medicaid funding-availability of trach capable facilities  Level of care: Progressive   Code Status: DNR Family Communication: Sister [name] on 10/5.  She is hopeful that he will get a bed at [facility] since that is only 45 minutes from her home DVT prophylaxis: Subcutaneous heparin COVID vaccination status: Unknown  HPI:  50 y.o. 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  He has had repetitive blinking concerning for seizure activity but EEG has been negative.  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  Since admission he has been treated for sepsis secondary to Blood culture showed Klebsiella and MSSA, he did complete a course of antibiotics.  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 Goals of care discussion held by palliative care team.  Patient's sister was adamant about continuing full scope of treatment although patient does remain a DNR   Subjective:  In bed more awake alert today, appears to be in no distress, unable to answer questions or follow commands.  Objective: Vitals:   07/13/21 0800 07/13/21 0921  BP: (!) 127/97   Pulse: (!) 108   Resp: 20   Temp:    SpO2: 98%  99%    Intake/Output Summary (Last 24 hours) at 07/13/2021 1048 Last data filed at 07/13/2021 0340 Gross per 24 hour  Intake 300.31 ml  Output 2000 ml  Net -1699.69 ml   Filed Weights   07/11/21 0500 07/12/21 0500 07/13/21 0254  Weight: 65.2 kg 62.2 kg 66.3 kg    Exam:  Awake but unable to answer questions or follow commands, does track people in the room, tracheostomy tube and PEG tube in place.   [place].AT,PERRAL Supple Neck, No JVD,   Symmetrical Chest wall movement, Good air movement bilaterally, CTAB RRR,No Gallops, Rubs or new Murmurs,  +ve B.Sounds, Abd Soft, No tenderness,   No Cyanosis, Clubbing or edema,      Significant Events: 8/2 PEA Arrest, King airway in the field, CPR x6 minutes and epi x1, ROSC, Intubated in ED, Admitted to [facility]  8/24 PEG placed 8/4 Weaned off TTM, Coox O2 satof 47%. 8/8 Post pyloric Cortrak placed, myoclonic jerking, started Keppra, depakote 8/10 Completed antibiotic course for pneumonia. Family meeting transitioned to DNR. 8/15 Tracheostomy  8/18 Febrile overnight, started Zosyn for aspiration pneumonia 8/22 Completed Abx course for aspiration pneumonia  8/24 PEG placed 8/31 PSV wean 2 hours 9/1 Stopped lasix, farxiga, losartan, spiro with AKI. Ongoing jerking, diaphoresis. WBC increased, cultured / abx started empirically.  PCT increased. CK 2,450 9/2 start scheduled opiates for possible opiate withdrawal symptoms 9/4 add flexeril,  on PSV.  Versed x1 overnight. 9/9 has tolerated trach collar for 48+hours 9/23 repetitive blinking with concerns for seizure activity-EEG ordered -was negative  Assessment/Plan:  Sepsis due to ESBL Klebsiella tracheobronchitis and history of Klebsiella UTI with sepsis in the past :  had Resolved, however febrile with decreased mental status on 07/11/2021, developing tracheobronchitis again clinically, will be placed on 5 days of Meropenem and Vancomycin starting on 07/13/2021,  Cultures noted Trach Staph + ESBL  Klebsiella, BC Staph Epi likely contaminant  -monitor.  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  Hypovolemic hyponatremia: Resolved.    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   Opiate withdrawals:  Resolved.  Nutrition:  Status post PEG tube placement continue tube feedings and free water flushes.  Left upper lung nodule:  Will need a follow-up CT in 3 months post discharge with PCCM follow-up.  Hyperlipidemia:  Continue Lipitor.   Goals of care:  PVS-guarded prognosis -palliative care was consulted sister adamant about continuing full scope of treatment.      Data Reviewed: Basic Metabolic Panel: Recent Labs  Lab 07/08/21 0758 07/10/21 0902 07/11/21 0728 07/12/21 0256 07/13/21 0256  NA 135 136 133* 132* 136  K 4.1 4.5 4.5 4.3 4.0  CL 100 101 97* 98 101  CO2 25 26 27 23 25   GLUCOSE 128* 116* 155* 129* 156*  BUN 20 17 17 16 17   CREATININE 0.64 0.66 0.70 0.64 0.72  CALCIUM 10.0 9.9 9.7 9.8 9.9   Liver Function Tests: Recent Labs  Lab 07/10/21 0902  AST 35  ALT 100*  ALKPHOS 60  BILITOT 0.4  PROT 7.3  ALBUMIN 3.6    CBC: Recent Labs   Lab 07/08/21 0758 07/10/21 0902 07/11/21 0728 07/12/21 0256 07/13/21 0256  WBC 10.4 10.8* 11.2* 12.3* 10.6*  NEUTROABS 5.1 6.0  --  6.9 6.1  HGB 12.9* 12.8* 13.2 13.7 12.7*  HCT 39.3 39.5 40.0 41.2 39.5  MCV 87.3 88.0 88.3 87.5 88.8  PLT 431* 368 387 431* 400    BNP (last 3 results) Recent Labs    04/29/21 2048 06/09/21 0350  BNP 626.7* 406.5*    CBG: Recent Labs  Lab 07/12/21 1541 07/12/21 2158 07/12/21 2355 07/13/21 0245 07/13/21 0734  GLUCAP 146* 135* 111* 162* 145*    Scheduled Meds:  arformoterol  15 mcg Nebulization BID   atorvastatin  40 mg Per Tube QHS   budesonide (PULMICORT) nebulizer solution  0.5 mg Nebulization BID   chlorhexidine  15 mL Mouth Rinse BID   clonazePAM  0.5 mg Per Tube BID   docusate  200 mg Per Tube BID   feeding supplement (PROSource TF)  45 mL Per Tube TID   fiber  1 packet Per Tube BID   free water  200 mL Per Tube Q6H  heparin  5,000 Units Subcutaneous Q8H   ketotifen  1 drop Both Eyes BID   levETIRAcetam  1,000 mg Per Tube BID   lisinopril  2.5 mg Per Tube Daily   mouth rinse  15 mL Mouth Rinse q12n4p   metoprolol tartrate  100 mg Per Tube BID   pantoprazole sodium  40 mg Per Tube QHS   polyethylene glycol  17 g Per Tube BID   polyvinyl alcohol  2 drop Both Eyes Q6H   revefenacin  175 mcg Nebulization Daily   senna  1 tablet Per Tube BID   Continuous Infusions:  sodium chloride 250 mL (06/15/21 1719)   feeding supplement (JEVITY 1.5 CAL/FIBER) 1,000 mL (07/13/21 0954)   piperacillin-tazobactam (ZOSYN)  IV 3.375 g (07/13/21 0517)   vancomycin 1,000 mg (07/13/21 0018)    Active Problems:   Cardiac arrest (HCC)   Acute respiratory failure with hypoxia (HCC)   Seizure-like activity (HCC)   Anoxic encephalopathy (HCC)   Tracheostomy in place [facility])   Goals of care, counseling/discussion   AKI (acute kidney injury) (HCC)   Tracheostomy dependence (HCC)   Persistent vegetative state (HCC)   Generalized anxiety  disorder   Other chronic pain   Ogilvie syndrome   Gram-negative infection   Acute UTI   Urinary tract infection due to ESBL Klebsiella   Consultants: Neurology General surgery Interventional radiology PCCM  Procedures: 2D echocardiogram Multiple EEGs Cortrak placement PEG tube placement  Antibiotics: Azithromycin 8/2 through 8/3 Cefepime 8/2 through 8/3 Ceftriaxone 8/3 through 8/7 Vancomycin 8/2 x 1 dose Zosyn 8/18 x 1 Unasyn 8/19 through 8/21 Cefazolin 8/24 through 8/24 Fluconazole 8/25 through 9/3 Zosyn 8/25 x 1 dose Cefepime 9/1 x 1 dose Vancomycin 9/1 x 1 dose Azithromycin 9/8 through 9/12 Zosyn 9/12 x 1 dose Meropenem 9/13 through 9/20  5 days of Meropenem and Vancomycin starting on 07/13/2021   Time spent: 25 minutes 75  days  Signature  Susa Raring M.D on 07/13/2021 at 10:48 AM   -  To page go to [email or web address]

## 2021-07-14 ENCOUNTER — Inpatient Hospital Stay (HOSPITAL_COMMUNITY): Payer: Medicaid Other

## 2021-07-14 DIAGNOSIS — I469 Cardiac arrest, cause unspecified: Secondary | ICD-10-CM | POA: Diagnosis not present

## 2021-07-14 DIAGNOSIS — K5981 Ogilvie syndrome: Secondary | ICD-10-CM | POA: Diagnosis not present

## 2021-07-14 DIAGNOSIS — Z930 Tracheostomy status: Secondary | ICD-10-CM | POA: Diagnosis not present

## 2021-07-14 DIAGNOSIS — R403 Persistent vegetative state: Secondary | ICD-10-CM | POA: Diagnosis not present

## 2021-07-14 LAB — CULTURE, BLOOD (ROUTINE X 2)

## 2021-07-14 LAB — GLUCOSE, CAPILLARY
Glucose-Capillary: 130 mg/dL — ABNORMAL HIGH (ref 70–99)
Glucose-Capillary: 129 mg/dL — ABNORMAL HIGH (ref 70–99)
Glucose-Capillary: 118 mg/dL — ABNORMAL HIGH (ref 70–99)
Glucose-Capillary: 115 mg/dL — ABNORMAL HIGH (ref 70–99)
Glucose-Capillary: 109 mg/dL — ABNORMAL HIGH (ref 70–99)

## 2021-07-14 LAB — BASIC METABOLIC PANEL
Anion gap: 9 (ref 5–15)
BUN: 17 mg/dL (ref 6–20)
CO2: 24 mmol/L (ref 22–32)
Calcium: 9.7 mg/dL (ref 8.9–10.3)
Chloride: 98 mmol/L (ref 98–111)
Creatinine, Ser: 0.58 mg/dL — ABNORMAL LOW (ref 0.61–1.24)
GFR, Estimated: >60 mL/min (ref >60)
Glucose, Bld: 94 mg/dL (ref 70–99)
Potassium: 4 mmol/L (ref 3.5–5.1)
Sodium: 131 mmol/L — ABNORMAL LOW (ref 135–145)

## 2021-07-14 LAB — CBC WITH DIFFERENTIAL/PLATELET
Abs Immature Granulocytes: 0.15 10*3/uL — ABNORMAL HIGH (ref 0.00–0.07)
Basophils Absolute: 0.1 10*3/uL (ref 0.0–0.1)
Basophils Relative: 1 %
Eosinophils Absolute: 0.3 10*3/uL (ref 0.0–0.5)
Eosinophils Relative: 3 %
HCT: 39.7 % (ref 39.0–52.0)
Hemoglobin: 12.8 g/dL — ABNORMAL LOW (ref 13.0–17.0)
Immature Granulocytes: 1 %
Lymphocytes Relative: 33 %
Lymphs Abs: 3.7 10*3/uL (ref 0.7–4.0)
MCH: 28.8 pg (ref 26.0–34.0)
MCHC: 32.2 g/dL (ref 30.0–36.0)
MCV: 89.4 fL (ref 80.0–100.0)
Monocytes Absolute: 1.4 10*3/uL — ABNORMAL HIGH (ref 0.1–1.0)
Monocytes Relative: 13 %
Neutro Abs: 5.5 10*3/uL (ref 1.7–7.7)
Neutrophils Relative %: 49 %
Platelets: 425 10*3/uL — ABNORMAL HIGH (ref 150–400)
RBC: 4.44 MIL/uL (ref 4.22–5.81)
RDW: 15.6 % — ABNORMAL HIGH (ref 11.5–15.5)
WBC: 11.2 10*3/uL — ABNORMAL HIGH (ref 4.0–10.5)
nRBC: 0 % (ref 0.0–0.2)

## 2021-07-14 MED ORDER — LACTATED RINGERS IV SOLN: INTRAVENOUS | Status: AC

## 2021-07-14 MED ORDER — CLONAZEPAM 0.5 MG PO TABS
0.5000 mg | ORAL_TABLET | Freq: Every day | ORAL | Status: DC
  Administered 2021-07-14 – 2021-07-21 (×8): 0.5 mg
  Filled 2021-07-14 (×8): qty 1

## 2021-07-14 NOTE — TOC Progression Note (Signed)
Transition of Care [facility]) - Progression Note    Patient Details  Name: Vincent [name] MRN: [id number] Date of Birth: 25-Nov-1970  Transition of Care [facility]) CM/SW Contact  Janae Bridgeman, RN Phone Number: 07/14/2021, 1:58 PM  Clinical Narrative:    CM and MSW with DTP Team continue to explore SNF options for the patient.  No bed offers are available at this time.  Rennis Harding, NP states that the attending physician has been communicating with the patient's sister regarding medical updates.   Expected Discharge Plan: Skilled Nursing Facility Barriers to Discharge: Continued Medical Work up, No SNF bed  Expected Discharge Plan and Services Expected Discharge Plan: Skilled Nursing Facility In-house Referral: Clinical Social Work Discharge Planning Services: CM Consult Post Acute Care Choice: Skilled Nursing Facility Living arrangements for the past 2 months: Single Family Home                                       Social Determinants of Health (SDOH) Interventions    Readmission Risk Interventions No flowsheet data found.

## 2021-07-14 NOTE — Progress Notes (Signed)
Nutrition Follow-up  DOCUMENTATION CODES:  Not applicable  INTERVENTION:  Continue TF via PEG: -Jevity 1.5 at 60 ml/h (1440 ml/d) -66m Prosource TF TID -Free water flushes 200 ml every 6 hours  Provides 2280 kcal, 125 gm protein, 1094 ml free water daily (1894 ml total with flushes)  NUTRITION DIAGNOSIS:  Inadequate oral intake related to inability to eat as evidenced by NPO status. -- ongoing   GOAL:  Patient will meet greater than or equal to 90% of their needs -- met with TF  MONITOR:  Labs, Weight trends, TF tolerance, Skin, I & O's  REASON FOR ASSESSMENT:  Ventilator, Consult Enteral/tube feeding initiation and management  ASSESSMENT:  50yo male admitted S/P PEA cardiac arrest at home. PMH includes tobacco abuse, emphysema, CVA, diabetes.  Per NP, pt with fever 2/2 ESBL Klebsiella tracheobronchitis (recurrent)/abnormal blood cultures with MRSE. Repeat blood cultures to be drawn today.   Pt sleeping at time of RD visit and did not wake to RD voice/touch. Pt remains NPO and continues to receive TF via PEG. Tolerating well per RN. Current TF orders: Jevity 1.5 at 60 ml/h with Prosource TF 45 ml TID and 2059mfree water Q6H.  Medications: Nutrisource fiber BID per tube, Colace, Protonix, Miralax, Senokot, Keppra, IV abx Labs: Na 131 (L) CBGs: 106-164 x 24 hours  Current weight 65.6 kg Admission weight 70 kg   UOP: 60047mocumented x24 hours  Diet Order:   Diet Order     None      EDUCATION NEEDS:  Not appropriate for education at this time  Skin:  Skin Assessment: Reviewed RN Assessment  Last BM:  10/16  Height:  Ht Readings from Last 1 Encounters:  05/26/21 5' 8"  (1.727 m)   Weight:  Wt Readings from Last 1 Encounters:  07/14/21 65.6 kg   Ideal Body Weight:  70 kg  BMI:  Body mass index is 21.99 kg/m.  Estimated Nutritional Needs:  Kcal:  2000-2200 Protein:  110-125 gm Fluid:  >/= 2 L    AmaLarkin InaS, RD, LDN (she/her/hers) RD  pager number and weekend/on-call pager number located in [place]

## 2021-07-14 NOTE — Progress Notes (Addendum)
[facility] PROGRESS NOTE  Vincent [name] TOI:[id number] DOB: 1971-06-15 DOA: 04/29/2021  Patient goes by Vincent [name]   PCP: Pcp, No  Status: Remains inpatient appropriate because:Altered mental status and Unsafe d/c plan  Dispo: The patient is from: Home              Anticipated d/c is to: SNF-trach capable              Patient currently is medically stable to d/c.  Difficult to place patient Yes               Barriers to discharge: Medicaid funding-availability of trach capable facilities  Level of care: Progressive   Code Status:  DNR Family Communication:  Vincent [name] [phone] on 07/14/21 - explained ongoing aspirations  - very poor prognosis - understands DVT prophylaxis:  Subcutaneous heparin COVID vaccination status:  Unknown  HPI: 50 y.o. 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  He has had repetitive blinking concerning for seizure activity but EEG has been negative.  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  Since admission he has been treated for sepsis secondary to Blood culture showed Klebsiella and MSSA, he did complete a course of antibiotics.  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 Goals of care discussion held by palliative care team.  Patient's sister was adamant about continuing full scope of treatment although patient does remain a DNR   Subjective:  In bed more awake alert today, appears to be in no distress, unable to answer questions or follow commands.  Objective: Vitals:   07/14/21 0812 07/14/21 0816  BP:  125/82  Pulse:  (!) 110  Resp:  19  Temp:  98.4 F (36.9 C)  SpO2: 100% 100%     Intake/Output Summary (Last 24 hours) at 07/14/2021 0852 Last data filed at 07/14/2021 0730 Gross per 24 hour  Intake 1754.9 ml  Output 1300 ml  Net 454.9 ml   Filed Weights   07/12/21 0500 07/13/21 0254 07/14/21 0407  Weight: 62.2 kg 66.3 kg 65.6 kg    Exam:  Awake but unable to answer questions or follow commands, per RN slept early am today - did not sleep last night, tracheostomy tube and PEG tube in place.   [email or web address],PERRAL Supple Neck,   Symmetrical Chest wall movement, Good air movement bilaterally, CTAB RRR,No Gallops, Rubs or new Murmurs,  +ve B.Sounds, Abd Soft, No tenderness,   No Cyanosis, Clubbing or edema,    Significant Events: 8/2 PEA Arrest, King airway in the field, CPR x6 minutes and epi x1, ROSC, Intubated in ED, Admitted to [facility]  8/24 PEG placed 8/4 Weaned off TTM, Coox O2 satof 47%. 8/8 Post pyloric Cortrak placed, myoclonic jerking, started Keppra, depakote 8/10 Completed antibiotic course for pneumonia. Family meeting transitioned to DNR. 8/15 Tracheostomy  8/18 Febrile overnight, started Zosyn for aspiration pneumonia 8/22 Completed Abx course for aspiration pneumonia  8/24 PEG placed 8/31 PSV wean 2 hours 9/1 Stopped lasix, farxiga, losartan, spiro with AKI. Ongoing jerking, diaphoresis. WBC increased, cultured / abx started empirically.  PCT increased. CK 2,450 9/2 start scheduled opiates for possible opiate withdrawal symptoms 9/4 add flexeril, on PSV.  Versed x1 overnight. 9/9 has tolerated trach collar for 48+hours 9/23 repetitive blinking with concerns for seizure activity-EEG ordered -was negative  Assessment/Plan:  Sepsis due to ESBL Klebsiella tracheobronchitis and history of Klebsiella UTI with sepsis in the past :  had Resolved, however febrile with decreased mental status on 07/11/2021, developing tracheobronchitis again clinically, will be placed on 5 days of Meropenem and Vancomycin starting on 07/13/2021,  Cultures noted Trach Staph  + ESBL Klebsiella, BC Staph Epi likely contaminant  - monitor, at risk for chronic micro aspiration.  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 Longterm prognosis remains extremely poor with very poor quality of life and little no meaningful neurological activity or recovery. Seen by Pall. Care.  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  Hypovolemic hyponatremia: Resolved.    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   Opiate withdrawals:  Resolved.  Nutrition:  Status post PEG tube placement continue tube feedings and free water flushes.  Left upper lung nodule:  Will need a follow-up CT in 3 months post discharge with PCCM follow-up.  Hyperlipidemia:  Continue Lipitor.   Goals of care:  PVS-guarded prognosis -palliative care was consulted sister adamant about continuing full scope of treatment.      Data Reviewed: Basic Metabolic Panel: Recent Labs  Lab 07/10/21 0902 07/11/21 0728 07/12/21 0256 07/13/21 0256 07/14/21 0408  NA 136 133* 132* 136 131*  K 4.5 4.5 4.3 4.0 4.0  CL 101 97* 98 101 98  CO2 26 27 23 25 24   GLUCOSE 116* 155* 129*  156* 94  BUN 17 17 16 17 17   CREATININE 0.66 0.70 0.64 0.72 0.58*  CALCIUM 9.9 9.7 9.8 9.9 9.7   Liver Function Tests: Recent Labs  Lab 07/10/21 0902  AST 35  ALT 100*  ALKPHOS 60  BILITOT 0.4  PROT 7.3  ALBUMIN 3.6    CBC: Recent Labs  Lab 07/08/21 0758 07/10/21 0902 07/11/21 0728 07/12/21 0256 07/13/21 0256 07/14/21 0408  WBC 10.4 10.8* 11.2* 12.3* 10.6* 11.2*  NEUTROABS 5.1 6.0  --  6.9 6.1 5.5  HGB 12.9* 12.8* 13.2 13.7 12.7* 12.8*  HCT 39.3 39.5 40.0 41.2 39.5 39.7  MCV 87.3 88.0 88.3 87.5 88.8 89.4  PLT 431* 368 387 431* 400 425*    BNP (last 3 results) Recent Labs    04/29/21 2048 06/09/21 0350  BNP 626.7* 406.5*    CBG: Recent Labs  Lab 07/13/21 1543 07/13/21 2023 07/13/21 2322 07/14/21 0332 07/14/21 0754  GLUCAP 106* 119* 164* 130* 129*    Scheduled Meds:  arformoterol  15 mcg Nebulization BID   atorvastatin  40 mg Per Tube QHS   budesonide (PULMICORT) nebulizer solution  0.5 mg Nebulization BID   chlorhexidine  15 mL Mouth Rinse BID   clonazePAM  0.5 mg Per  Tube BID   docusate  200 mg Per Tube BID   feeding supplement (PROSource TF)  45 mL Per Tube TID   fiber  1 packet Per Tube BID   free water  200 mL Per Tube Q6H   heparin  5,000 Units Subcutaneous Q8H   ketotifen  1 drop Both Eyes BID   levETIRAcetam  1,000 mg Per Tube BID   lisinopril  2.5 mg Per Tube Daily   mouth rinse  15 mL Mouth Rinse q12n4p   metoprolol tartrate  100 mg Per Tube BID   pantoprazole sodium  40 mg Per Tube QHS   polyethylene glycol  17 g Per Tube BID   polyvinyl alcohol  2 drop Both Eyes Q6H   revefenacin  175 mcg Nebulization Daily   senna  1 tablet Per Tube BID   Continuous Infusions:  sodium chloride 250 mL (06/15/21 1719)   feeding supplement (JEVITY 1.5 CAL/FIBER) 1,000 mL (07/13/21 0954)   meropenem (MERREM) IV 1 g (07/14/21 0519)   vancomycin 1,000 mg (07/13/21 2226)    Active Problems:   Cardiac arrest (HCC)   Acute respiratory failure with  hypoxia (HCC)   Seizure-like activity (HCC)   Anoxic encephalopathy (HCC)   Tracheostomy in place [facility])   Goals of care, counseling/discussion   AKI (acute kidney injury) (HCC)   Tracheostomy dependence (HCC)   Persistent vegetative state (HCC)   Generalized anxiety disorder   Other chronic pain   Ogilvie syndrome   Gram-negative infection   Acute UTI   Urinary tract infection due to ESBL Klebsiella   Consultants: Neurology General surgery Interventional radiology PCCM Pall.Care  Procedures: 2D echocardiogram Multiple EEGs Cortrak placement PEG tube placement  Antibiotics: Azithromycin 8/2 through 8/3 Cefepime 8/2 through 8/3 Ceftriaxone 8/3 through 8/7 Vancomycin 8/2 x 1 dose Zosyn 8/18 x 1 Unasyn 8/19 through 8/21 Cefazolin 8/24 through 8/24 Fluconazole 8/25 through 9/3 Zosyn 8/25 x 1 dose Cefepime 9/1 x 1 dose Vancomycin 9/1 x 1 dose Azithromycin 9/8 through 9/12 Zosyn 9/12 x 1 dose Meropenem 9/13 through 9/20  5 days of Meropenem and Vancomycin starting on 07/13/2021   Time spent: 25 minutes 76  days  Signature  -  Susa Raring M.D on 07/14/2021 at 8:52 AM   -  To page go to [email or web address]

## 2021-07-14 NOTE — Progress Notes (Signed)
[facility] PROGRESS NOTE  Vincent [name] GGE:[id number] DOB: 01-23-1971 DOA: 04/29/2021  Patient goes by Vincent [name]   PCP: Pcp, No  Status: Remains inpatient appropriate because:Altered mental status and Unsafe d/c plan  Dispo: The patient is from: Home              Anticipated d/c is to: SNF-trach capable              Patient currently is medically stable to d/c.  Difficult to place patient Yes               Barriers to discharge: Medicaid funding-availability of trach capable facilities  Level of care: Progressive   Code Status: DNR Family Communication: Sister [name] on 10/5.  She is hopeful that he will get a bed [facility] since that is only 45 minutes from her home DVT prophylaxis: Subcutaneous heparin COVID vaccination status: Unknown  HPI:  50 y.o. 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  He has had repetitive blinking concerning for seizure activity but EEG has been negative.  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  Since admission he has been treated for sepsis secondary to Blood culture showed Klebsiella and MSSA, he did complete a course of antibiotics.  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 Goals of care discussion held by palliative care team.  Patient's sister was adamant about continuing full scope of treatment although patient does remain a DNR   Subjective: Awake, mildly toxic in appearance.  Objective: Vitals:   07/14/21 0401 07/14/21 0415  BP: 99/64   Pulse: (!) 102 (!) 105  Resp: (!) 24 (!) 25  Temp: 98.8 F (37.1 C)   SpO2: 98% 100%    Intake/Output Summary (Last 24  hours) at 07/14/2021 0735 Last data filed at 07/14/2021 0730 Gross per 24 hour  Intake 1754.9 ml  Output 1300 ml  Net 454.9 ml   Filed Weights   07/12/21 0500 07/13/21 0254 07/14/21 0407  Weight: 62.2 kg 66.3 kg 65.6 kg    Exam:  Constitutional: Awake, eyes open, mildly toxic in appearance Respiratory: #6.0 cuffed Shiley trach, stable on room air via TC.  Lung sounds to auscultation bilaterally, no increased work of breathing although he does have mild tachypnea, pulse ox 94-98 % Cardiovascular: S1-S2, no peripheral edema, normotensive, stable tachycardia, extremities warm and dry Abdomen: Nontender soft and chronically distended, normoactive bowel sounds, G-tube site unremarkable, LBM 10/10 Neurologic: CN 2-12 grossly intact, currently asleep; at baseline tracks persons in room, occasionally smiles, occasionally has purposeful but dysmetric movements of upper extremities Psychiatric: Awake, nonverbal, unable to accurately assess  Significant Events: 8/2 PEA Arrest, King airway in the field, CPR x6 minutes and epi x1, ROSC, Intubated in ED, Admitted to [facility]  8/24 PEG placed 8/4 Weaned off TTM, Coox O2 satof 47%. 8/8 Post pyloric Cortrak placed, myoclonic jerking, started Keppra, depakote 8/10 Completed antibiotic course for pneumonia. Family meeting transitioned to DNR. 8/15 Tracheostomy  8/18 Febrile overnight, started Zosyn for aspiration pneumonia 8/22 Completed Abx course for aspiration pneumonia  8/24 PEG placed 8/31 PSV wean 2 hours 9/1 Stopped lasix, farxiga, losartan, spiro with AKI. Ongoing jerking, diaphoresis. WBC  increased, cultured / abx started empirically.  PCT increased. CK 2,450 9/2 start scheduled opiates for possible opiate withdrawal symptoms 9/4 add flexeril, on PSV.  Versed x1 overnight. 9/9 has tolerated trach collar for 48+hours 9/23 repetitive blinking with concerns for seizure activity-EEG ordered -was negative  Assessment/Plan: Acute problems: Anoxic  brain injury status post PEA arrest w/ PVS/myoclonus seizures: Continue Keppra  Fever secondary to ESBL Klebsiella tracheobronchitis (recurrent)/abnormal blood cultures with MRSE Continue meropenem for 7 to 10 days Blood culture likely contaminant - has received 2 doses of IV vancomycin Repeat blood cultures to be drawn today 10/17  Acute respiratory failure with hypoxia in the setting of cardiac arrest and aspiration pneumonia: Trach team following prn --due to PVS not a candidate for decannulation  Suspected anxiety/pain Continue Tylenol prn, Oxy IR 2.5 mg prn and scheduled Klonopin  Injected sclera bilaterally Continue Zaditor eyedrops  Sepsis due to ESBL Klebsiella tracheobronchitis and ESBL Klebsiella UTI: Treated with meropenem; has had recurrence of tracheobronchitis-see above-sepsis physiology previously resolved   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  Hypovolemic hyponatremia: Resolved.    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  Nutrition: Status post PEG tube placement continue tube feedings and free water flushes.  Left upper lung nodule: Will need a follow-up CT in 3 months.  Hyperlipidemia: Continue Lipitor.   Goals of care: PVS-guarded prognosis -palliative care was consulted sister adamant about continuing full scope of treatment.         Data Reviewed: Basic Metabolic Panel: Recent Labs  Lab 07/10/21 0902 07/11/21 0728 07/12/21 0256 07/13/21 0256 07/14/21 0408  NA 136 133* 132* 136 131*  K 4.5 4.5 4.3 4.0 4.0  CL 101 97* 98 101 98  CO2 26 27 23 25 24   GLUCOSE 116* 155* 129* 156* 94  BUN 17 17 16 17 17   CREATININE 0.66 0.70 0.64 0.72 0.58*  CALCIUM 9.9 9.7 9.8 9.9 9.7   Liver Function Tests: Recent Labs  Lab  07/10/21 0902  AST 35  ALT 100*  ALKPHOS 60  BILITOT 0.4  PROT 7.3  ALBUMIN 3.6    CBC: Recent Labs  Lab 07/08/21 0758 07/10/21 0902 07/11/21 0728 07/12/21 0256 07/13/21 0256 07/14/21 0408  WBC 10.4 10.8* 11.2* 12.3* 10.6* 11.2*  NEUTROABS 5.1 6.0  --  6.9 6.1 5.5  HGB 12.9* 12.8* 13.2 13.7 12.7* 12.8*  HCT 39.3 39.5 40.0 41.2 39.5 39.7  MCV 87.3 88.0 88.3 87.5 88.8 89.4  PLT 431* 368 387 431* 400 425*    BNP (last 3 results) Recent Labs    04/29/21 2048 06/09/21 0350  BNP 626.7* 406.5*    CBG: Recent Labs  Lab 07/13/21 1157 07/13/21 1543 07/13/21 2023 07/13/21 2322 07/14/21 0332  GLUCAP 116* 106* 119* 164* 130*       Scheduled Meds:  arformoterol  15 mcg Nebulization BID   atorvastatin  40 mg Per Tube QHS   budesonide (PULMICORT) nebulizer solution  0.5 mg Nebulization BID   chlorhexidine  15 mL Mouth Rinse BID   clonazePAM  0.5 mg Per Tube BID   docusate  200 mg Per Tube BID   feeding supplement (PROSource TF)  45 mL Per Tube TID   fiber  1 packet Per Tube BID   free water  200 mL Per Tube Q6H   heparin  5,000 Units Subcutaneous Q8H  ketotifen  1 drop Both Eyes BID   levETIRAcetam  1,000 mg Per Tube BID   lisinopril  2.5 mg Per Tube Daily   mouth rinse  15 mL Mouth Rinse q12n4p   metoprolol tartrate  100 mg Per Tube BID   pantoprazole sodium  40 mg Per Tube QHS   polyethylene glycol  17 g Per Tube BID   polyvinyl alcohol  2 drop Both Eyes Q6H   revefenacin  175 mcg Nebulization Daily   senna  1 tablet Per Tube BID   Continuous Infusions:  sodium chloride 250 mL (06/15/21 1719)   feeding supplement (JEVITY 1.5 CAL/FIBER) 1,000 mL (07/13/21 0954)   meropenem (MERREM) IV 1 g (07/14/21 0519)   vancomycin 1,000 mg (07/13/21 2226)    Active Problems:   Cardiac arrest (HCC)   Acute respiratory failure with hypoxia (HCC)   Seizure-like activity (HCC)   Anoxic encephalopathy (HCC)   Tracheostomy in place [facility])   Goals of care,  counseling/discussion   AKI (acute kidney injury) (HCC)   Tracheostomy dependence (HCC)   Persistent vegetative state (HCC)   Generalized anxiety disorder   Other chronic pain   Ogilvie syndrome   Gram-negative infection   Acute UTI   Urinary tract infection due to ESBL Klebsiella   Consultants: Neurology General surgery Interventional radiology PCCM  Procedures: 2D echocardiogram Multiple EEGs Cortrak placement PEG tube placement  Antibiotics: Azithromycin 8/2 through 8/3 Cefepime 8/2 through 8/3 Ceftriaxone 8/3 through 8/7 Vancomycin 8/2 x 1 dose Zosyn 8/18 x 1 Unasyn 8/19 through 8/21 Cefazolin 8/24 through 8/24 Fluconazole 8/25 through 9/3 Zosyn 8/25 x 1 dose Cefepime 9/1 x 1 dose Vancomycin 9/1 x 1 dose Azithromycin 9/8 through 9/12 Zosyn 9/12 x 1 dose Meropenem 9/13 through 9/20 Unasyn 10/14 x 1 dose Zosyn 10/15 x 1 dose Vancomycin 10/15 through 10/16 Meropenem 10/16 >>   Time spent: 15 minutes    Junious Silk ANP  [facility] 7 am - 330 pm/M-F for direct patient care and secure chat Please refer to Amion for contact info 76  days

## 2021-07-15 ENCOUNTER — Inpatient Hospital Stay (HOSPITAL_COMMUNITY): Payer: Medicaid Other

## 2021-07-15 DIAGNOSIS — R569 Unspecified convulsions: Secondary | ICD-10-CM | POA: Diagnosis not present

## 2021-07-15 DIAGNOSIS — R403 Persistent vegetative state: Secondary | ICD-10-CM | POA: Diagnosis not present

## 2021-07-15 DIAGNOSIS — I469 Cardiac arrest, cause unspecified: Secondary | ICD-10-CM | POA: Diagnosis not present

## 2021-07-15 DIAGNOSIS — K5981 Ogilvie syndrome: Secondary | ICD-10-CM | POA: Diagnosis not present

## 2021-07-15 DIAGNOSIS — Z930 Tracheostomy status: Secondary | ICD-10-CM | POA: Diagnosis not present

## 2021-07-15 LAB — CBC WITH DIFFERENTIAL/PLATELET
Abs Immature Granulocytes: 0.12 10*3/uL — ABNORMAL HIGH (ref 0.00–0.07)
Basophils Absolute: 0.1 10*3/uL (ref 0.0–0.1)
Basophils Relative: 1 %
Eosinophils Absolute: 0.3 10*3/uL (ref 0.0–0.5)
Eosinophils Relative: 3 %
HCT: 41.1 % (ref 39.0–52.0)
Hemoglobin: 13.3 g/dL (ref 13.0–17.0)
Immature Granulocytes: 1 %
Lymphocytes Relative: 32 %
Lymphs Abs: 3.4 10*3/uL (ref 0.7–4.0)
MCH: 28.7 pg (ref 26.0–34.0)
MCHC: 32.4 g/dL (ref 30.0–36.0)
MCV: 88.8 fL (ref 80.0–100.0)
Monocytes Absolute: 1.2 10*3/uL — ABNORMAL HIGH (ref 0.1–1.0)
Monocytes Relative: 11 %
Neutro Abs: 5.7 10*3/uL (ref 1.7–7.7)
Neutrophils Relative %: 52 %
Platelets: 393 10*3/uL (ref 150–400)
RBC: 4.63 MIL/uL (ref 4.22–5.81)
RDW: 15.6 % — ABNORMAL HIGH (ref 11.5–15.5)
WBC: 10.8 10*3/uL — ABNORMAL HIGH (ref 4.0–10.5)
nRBC: 0 % (ref 0.0–0.2)

## 2021-07-15 LAB — COMPREHENSIVE METABOLIC PANEL
ALT: 86 U/L — ABNORMAL HIGH (ref 0–44)
AST: 31 U/L (ref 15–41)
Albumin: 3.5 g/dL (ref 3.5–5.0)
Alkaline Phosphatase: 56 U/L (ref 38–126)
Anion gap: 8 (ref 5–15)
BUN: 15 mg/dL (ref 6–20)
CO2: 24 mmol/L (ref 22–32)
Calcium: 9.8 mg/dL (ref 8.9–10.3)
Chloride: 102 mmol/L (ref 98–111)
Creatinine, Ser: 0.62 mg/dL (ref 0.61–1.24)
GFR, Estimated: >60 mL/min (ref >60)
Glucose, Bld: 127 mg/dL — ABNORMAL HIGH (ref 70–99)
Potassium: 4.2 mmol/L (ref 3.5–5.1)
Sodium: 134 mmol/L — ABNORMAL LOW (ref 135–145)
Total Bilirubin: 0.7 mg/dL (ref 0.3–1.2)
Total Protein: 7.1 g/dL (ref 6.5–8.1)

## 2021-07-15 LAB — GLUCOSE, CAPILLARY
Glucose-Capillary: 113 mg/dL — ABNORMAL HIGH (ref 70–99)
Glucose-Capillary: 119 mg/dL — ABNORMAL HIGH (ref 70–99)
Glucose-Capillary: 125 mg/dL — ABNORMAL HIGH (ref 70–99)
Glucose-Capillary: 135 mg/dL — ABNORMAL HIGH (ref 70–99)
Glucose-Capillary: 140 mg/dL — ABNORMAL HIGH (ref 70–99)
Glucose-Capillary: 91 mg/dL (ref 70–99)

## 2021-07-15 LAB — CULTURE, RESPIRATORY W GRAM STAIN

## 2021-07-15 LAB — BRAIN NATRIURETIC PEPTIDE: B Natriuretic Peptide: 346.8 pg/mL — ABNORMAL HIGH (ref 0.0–100.0)

## 2021-07-15 LAB — TSH: TSH: 2.95 u[IU]/mL (ref 0.350–4.500)

## 2021-07-15 LAB — PROCALCITONIN: Procalcitonin: <0.1 ng/mL

## 2021-07-15 LAB — MAGNESIUM: Magnesium: 2.1 mg/dL (ref 1.7–2.4)

## 2021-07-15 MED ORDER — LIP MEDEX EX OINT
TOPICAL_OINTMENT | CUTANEOUS | Status: DC | PRN
  Filled 2021-07-15: qty 7

## 2021-07-15 MED ORDER — CYCLOBENZAPRINE HCL 10 MG PO TABS: 5.0000 mg | ORAL_TABLET | Freq: Once | ORAL | Status: DC | PRN

## 2021-07-15 MED ORDER — METAXALONE 800 MG PO TABS
400.0000 mg | ORAL_TABLET | Freq: Three times a day (TID) | ORAL | Status: DC
  Administered 2021-07-15 – 2021-07-21 (×19): 400 mg via ORAL
  Filled 2021-07-15 (×8): qty 0.5
  Filled 2021-07-15: qty 1
  Filled 2021-07-15 (×11): qty 0.5

## 2021-07-15 NOTE — Procedures (Addendum)
Patient Name: Littleton Haub  MRN: [id number]  Epilepsy Attending: Charlsie Quest  Referring Physician/Provider: Junious Silk, NP Date: 07/15/2021 Duration: 23.46mins   Patient history: 50 year old male status post cardiac arrest, now with new spasticity. EEG to evaluate for seizure   Level of alertness:  awake   AEDs during EEG study: LEV, Clonazepam   Technical aspects: This EEG study was done with scalp electrodes positioned according to the 10-20 International system of electrode placement. Electrical activity was acquired at a sampling rate of 500Hz  and reviewed with a high frequency filter of 70Hz  and a low frequency filter of 1Hz . EEG data were recorded continuously and digitally stored.   Description: No clear posterior dominant rhythm was seen.  EEG showed continuous generalized 3 to 5 Hz theta-delta slowing.  Throughout the EEG, intermittently patient was noted to moan with generalized subtle stiffening.  Concomitant EEG before, during and after the event did not show any EEG to suggest seizure.  Hyperventilation and photic stimulation were not performed.      ABNORMALITY -Continuous slow, generalized   IMPRESSION: This study is suggestive of moderate diffuse encephalopathy, nonspecific to etiology.  No seizures or epileptiform discharges were seen during this study.  Throughout the EEG, intermittently patient was noted to morning with generalized subtle stiffening without concomitant EEG change.  These episodes were NON-EPILEPTIC.    Reda Gettis 

## 2021-07-15 NOTE — Progress Notes (Signed)
[facility] PROGRESS NOTE  Vincent [name] VOZ:[id number] DOB: Jun 24, 1971 DOA: 04/29/2021  Patient goes by Vincent [name]   PCP: Pcp, No  Status: Remains inpatient appropriate because:Altered mental status and Unsafe d/c plan  Dispo: The patient is from: Home              Anticipated d/c is to: SNF-trach capable              Patient currently is medically stable to d/c.  Difficult to place patient Yes               Barriers to discharge: Medicaid funding-availability of trach capable facilities  Level of care: Progressive   Code Status: DNR Family Communication: Sister [name] on 10/5.  She is hopeful that he will get a bed at [facility] since that is only 45 minutes from her home DVT prophylaxis: Subcutaneous heparin COVID vaccination status: Unknown  HPI:  50 y.o. 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  He has had repetitive blinking concerning for seizure activity but EEG has been negative.  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  Since admission he has been treated for sepsis secondary to Blood culture showed Klebsiella and MSSA, he did complete a course of antibiotics.  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 Goals of care discussion held by palliative care team.  Patient's sister was adamant about continuing full scope of treatment although patient does remain a DNR   Subjective: Much more awake today and actually smiled today when I spoke to him  Objective: Vitals:   07/15/21 0501 07/15/21 0604  BP: 110/84   Pulse: (!) 102 92  Resp: (!) 28 16  Temp:    SpO2: 99% 96%    Intake/Output Summary  (Last 24 hours) at 07/15/2021 0730 Last data filed at 07/15/2021 0602 Gross per 24 hour  Intake 2642.27 ml  Output 1850 ml  Net 792.27 ml   Filed Weights   07/13/21 0254 07/14/21 0407 07/15/21 0420  Weight: 66.3 kg 65.6 kg 66.1 kg    Exam:  Constitutional: Awake, eyes open tracking and smiling, back to baseline mentation Respiratory: #6.0 cuffed Shiley trach, stable on room air via TC.  Lung sounds to auscultation bilaterally, no increased work of breathing, pulse ox 94-98 % Cardiovascular: S1-S2, no peripheral edema, normotensive, stable tachycardia, appropriate capillary refill Abdomen: Nontender soft and chronically distended, normoactive bowel sounds, G-tube site unremarkable, LBM 10/17 Neurologic: CN 2-12 grossly intact-awake, smiled when spoken to today, wiggling in bed as if to reposition himself.  Tracking appropriately Psychiatric: Awake, nonverbal, unable to accurately assess  Significant Events: 8/2 PEA Arrest, King airway in the field, CPR x6 minutes and epi x1, ROSC, Intubated in ED, Admitted to [facility]  8/24 PEG placed 8/4 Weaned off TTM, Coox O2 satof 47%. 8/8 Post pyloric Cortrak placed, myoclonic jerking, started Keppra, depakote 8/10 Completed antibiotic course for pneumonia. Family meeting transitioned to DNR. 8/15 Tracheostomy  8/18 Febrile overnight, started Zosyn for aspiration pneumonia 8/22 Completed Abx course for aspiration pneumonia  8/24 PEG placed 8/31 PSV wean 2 hours 9/1 Stopped lasix, farxiga, losartan, spiro with AKI. Ongoing jerking, diaphoresis. WBC increased, cultured / abx  started empirically.  PCT increased. CK 2,450 9/2 start scheduled opiates for possible opiate withdrawal symptoms 9/4 add flexeril, on PSV.  Versed x1 overnight. 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 Patient was not on SMRs so I will start Skelaxin Check EEG to ensure spasticity not related to seizure activity  Fever secondary to ESBL Klebsiella tracheobronchitis (recurrent)/abnormal blood cultures with MRSE Continue meropenem for 5 days.  10/20 will be last dose Blood culture likely contaminant - has received 2 doses of IV vancomycin Repeat blood cultures  10/17 ending with no growth to date  Acute respiratory failure with hypoxia in the setting of cardiac arrest and aspiration pneumonia: Trach team following prn --due to PVS not a candidate for decannulation  Suspected anxiety/pain Continue Tylenol prn, Oxy IR 2.5 mg prn and scheduled Klonopin  Injected sclera bilaterally Continue Zaditor eyedrops  Sepsis due to ESBL Klebsiella tracheobronchitis and ESBL Klebsiella UTI: Treated with meropenem; has had recurrence of tracheobronchitis-see above-sepsis physiology previously resolved   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  Hypovolemic hyponatremia: Resolved.    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  Nutrition: Status post PEG tube placement continue tube feedings and free water flushes.  Left upper lung nodule: Will need a follow-up CT in 3 months.  Hyperlipidemia: Continue Lipitor.   Goals of care: PVS-guarded prognosis -palliative care was consulted sister adamant about continuing full scope of treatment.         Data Reviewed: Basic Metabolic Panel: Recent Labs  Lab 07/11/21 0728 07/12/21 0256 07/13/21 0256 07/14/21 0408 07/15/21 0314  NA 133* 132* 136 131* 134*  K 4.5 4.3 4.0 4.0 4.2  CL 97* 98 101 98 102   CO2 27 23 25 24 24   GLUCOSE 155* 129* 156* 94 127*  BUN 17 16 17 17 15   CREATININE 0.70 0.64 0.72 0.58* 0.62  CALCIUM 9.7 9.8 9.9 9.7 9.8  MG  --   --   --   --  2.1   Liver Function Tests: Recent Labs  Lab 07/10/21 0902 07/15/21 0314  AST 35 31  ALT 100* 86*  ALKPHOS 60 56  BILITOT 0.4 0.7  PROT 7.3 7.1  ALBUMIN 3.6 3.5    CBC: Recent Labs  Lab 07/10/21 0902 07/11/21 0728 07/12/21 0256 07/13/21 0256 07/14/21 0408 07/15/21 0314  WBC 10.8* 11.2* 12.3* 10.6* 11.2* 10.8*  NEUTROABS 6.0  --  6.9 6.1 5.5 5.7  HGB 12.8* 13.2 13.7 12.7* 12.8* 13.3  HCT 39.5 40.0 41.2 39.5 39.7 41.1  MCV 88.0 88.3 87.5 88.8 89.4 88.8  PLT 368 387 431* 400 425* 393    BNP (last 3 results) Recent Labs    04/29/21 2048 06/09/21 0350 07/15/21 0314  BNP 626.7* 406.5* 346.8*    CBG: Recent Labs  Lab 07/14/21 1108 07/14/21 1701 07/14/21 1942 07/14/21 2353 07/15/21 0417  GLUCAP 118* 115* 109* 119* 140*       Scheduled Meds:  arformoterol  15 mcg Nebulization BID   atorvastatin  40 mg Per Tube QHS   budesonide (PULMICORT) nebulizer solution  0.5 mg Nebulization BID   chlorhexidine  15 mL  Mouth Rinse BID   clonazePAM  0.5 mg Per Tube QHS   docusate  200 mg Per Tube BID   feeding supplement (PROSource TF)  45 mL Per Tube TID   fiber  1 packet Per Tube BID   free water  200 mL Per Tube Q6H   heparin  5,000 Units Subcutaneous Q8H   ketotifen  1 drop Both Eyes BID   levETIRAcetam  1,000 mg Per Tube BID   lisinopril  2.5 mg Per Tube Daily   mouth rinse  15 mL Mouth Rinse q12n4p   metoprolol tartrate  100 mg Per Tube BID   pantoprazole sodium  40 mg Per Tube QHS   polyethylene glycol  17 g Per Tube BID   polyvinyl alcohol  2 drop Both Eyes Q6H   revefenacin  175 mcg Nebulization Daily   senna  1 tablet Per Tube BID   Continuous Infusions:  sodium chloride 250 mL (06/15/21 1719)   feeding supplement (JEVITY 1.5 CAL/FIBER) 1,000 mL (07/13/21 0954)   meropenem (MERREM) IV 1  g (07/15/21 0536)   vancomycin 1,000 mg (07/14/21 2248)    Active Problems:   Cardiac arrest (HCC)   Acute respiratory failure with hypoxia (HCC)   Seizure-like activity (HCC)   Anoxic encephalopathy (HCC)   Tracheostomy in place [facility])   Goals of care, counseling/discussion   AKI (acute kidney injury) (HCC)   Tracheostomy dependence (HCC)   Persistent vegetative state (HCC)   Generalized anxiety disorder   Other chronic pain   Ogilvie syndrome   Gram-negative infection   Acute UTI   Urinary tract infection due to ESBL Klebsiella   Consultants: Neurology General surgery Interventional radiology PCCM  Procedures: 2D echocardiogram Multiple EEGs Cortrak placement PEG tube placement  Antibiotics: Azithromycin 8/2 through 8/3 Cefepime 8/2 through 8/3 Ceftriaxone 8/3 through 8/7 Vancomycin 8/2 x 1 dose Zosyn 8/18 x 1 Unasyn 8/19 through 8/21 Cefazolin 8/24 through 8/24 Fluconazole 8/25 through 9/3 Zosyn 8/25 x 1 dose Cefepime 9/1 x 1 dose Vancomycin 9/1 x 1 dose Azithromycin 9/8 through 9/12 Zosyn 9/12 x 1 dose Meropenem 9/13 through 9/20 Unasyn 10/14 x 1 dose Zosyn 10/15 x 1 dose Vancomycin 10/15 through 10/16 Meropenem 10/16 >>   Time spent: 15 minutes    Junious Silk ANP  [facility] 7 am - 330 pm/M-F for direct patient care and secure chat Please refer to Amion for contact info 77  days

## 2021-07-15 NOTE — Progress Notes (Signed)
EEG completed, results pending. 

## 2021-07-15 NOTE — Procedures (Signed)
Tracheostomy Change Note  Patient Details:   Name: Sumeet Eligah Anello DOB: [birth year] MRN: [id number]    Airway Documentation:     Evaluation  O2 sats: stable throughout Complications: No apparent complications Patient did tolerate procedure well. Bilateral Breath Sounds: Diminished, Rhonchi  Patient's trach was changed to a #6 cuffless shiley without any complications. Positive color change noted on CO2 detector. Janina Mayo was secured with trach ties.     Carlynn Spry 07/15/2021, 4:56 PM

## 2021-07-15 NOTE — Plan of Care (Signed)

## 2021-07-15 NOTE — TOC Progression Note (Signed)
Transition of Care [facility]) - Progression Note    Patient Details  Name: Vincent [name] MRN: [id number] Date of Birth: Oct 26, 1970  Transition of Care [facility]) CM/SW Contact  Janae Bridgeman, RN Phone Number: 07/15/2021, 8:18 AM  Clinical Narrative:    Case management noted that the patient has no bed offers at this time for placement.  Clinicals were faxed out to various SNF facilities in the hub for review for potential bed offers.  CM and MSW with DTP Team continues to explore options for LTC placement.   Expected Discharge Plan: Skilled Nursing Facility Barriers to Discharge: Continued Medical Work up, No SNF bed  Expected Discharge Plan and Services Expected Discharge Plan: Skilled Nursing Facility In-house Referral: Clinical Social Work Discharge Planning Services: CM Consult Post Acute Care Choice: Skilled Nursing Facility Living arrangements for the past 2 months: Single Family Home                                       Social Determinants of Health (SDOH) Interventions    Readmission Risk Interventions No flowsheet data found.

## 2021-07-16 DIAGNOSIS — Z930 Tracheostomy status: Secondary | ICD-10-CM | POA: Diagnosis not present

## 2021-07-16 DIAGNOSIS — K5981 Ogilvie syndrome: Secondary | ICD-10-CM | POA: Diagnosis not present

## 2021-07-16 DIAGNOSIS — R403 Persistent vegetative state: Secondary | ICD-10-CM | POA: Diagnosis not present

## 2021-07-16 DIAGNOSIS — I469 Cardiac arrest, cause unspecified: Secondary | ICD-10-CM | POA: Diagnosis not present

## 2021-07-16 LAB — CBC WITH DIFFERENTIAL/PLATELET
Abs Immature Granulocytes: 0.16 10*3/uL — ABNORMAL HIGH (ref 0.00–0.07)
Basophils Absolute: 0.1 10*3/uL (ref 0.0–0.1)
Basophils Relative: 1 %
Eosinophils Absolute: 0.3 10*3/uL (ref 0.0–0.5)
Eosinophils Relative: 3 %
HCT: 43.8 % (ref 39.0–52.0)
Hemoglobin: 13.6 g/dL (ref 13.0–17.0)
Immature Granulocytes: 1 %
Lymphocytes Relative: 27 %
Lymphs Abs: 3 10*3/uL (ref 0.7–4.0)
MCH: 29.1 pg (ref 26.0–34.0)
MCHC: 31.1 g/dL (ref 30.0–36.0)
MCV: 93.6 fL (ref 80.0–100.0)
Monocytes Absolute: 1.1 10*3/uL — ABNORMAL HIGH (ref 0.1–1.0)
Monocytes Relative: 10 %
Neutro Abs: 6.6 10*3/uL (ref 1.7–7.7)
Neutrophils Relative %: 58 %
Platelets: 403 10*3/uL — ABNORMAL HIGH (ref 150–400)
RBC: 4.68 MIL/uL (ref 4.22–5.81)
RDW: 15.7 % — ABNORMAL HIGH (ref 11.5–15.5)
WBC: 11.3 10*3/uL — ABNORMAL HIGH (ref 4.0–10.5)
nRBC: 0 % (ref 0.0–0.2)

## 2021-07-16 LAB — COMPREHENSIVE METABOLIC PANEL
ALT: 115 U/L — ABNORMAL HIGH (ref 0–44)
AST: 41 U/L (ref 15–41)
Albumin: 3.7 g/dL (ref 3.5–5.0)
Alkaline Phosphatase: 59 U/L (ref 38–126)
Anion gap: 12 (ref 5–15)
BUN: 17 mg/dL (ref 6–20)
CO2: 21 mmol/L — ABNORMAL LOW (ref 22–32)
Calcium: 9.7 mg/dL (ref 8.9–10.3)
Chloride: 102 mmol/L (ref 98–111)
Creatinine, Ser: 0.63 mg/dL (ref 0.61–1.24)
GFR, Estimated: >60 mL/min (ref >60)
Glucose, Bld: 121 mg/dL — ABNORMAL HIGH (ref 70–99)
Potassium: 4.6 mmol/L (ref 3.5–5.1)
Sodium: 135 mmol/L (ref 135–145)
Total Bilirubin: 0.4 mg/dL (ref 0.3–1.2)
Total Protein: 7.1 g/dL (ref 6.5–8.1)

## 2021-07-16 LAB — CULTURE, BLOOD (ROUTINE X 2)
Culture: NO GROWTH
Special Requests: ADEQUATE

## 2021-07-16 LAB — MAGNESIUM: Magnesium: 1.9 mg/dL (ref 1.7–2.4)

## 2021-07-16 LAB — GLUCOSE, CAPILLARY
Glucose-Capillary: 106 mg/dL — ABNORMAL HIGH (ref 70–99)
Glucose-Capillary: 108 mg/dL — ABNORMAL HIGH (ref 70–99)
Glucose-Capillary: 125 mg/dL — ABNORMAL HIGH (ref 70–99)
Glucose-Capillary: 129 mg/dL — ABNORMAL HIGH (ref 70–99)
Glucose-Capillary: 130 mg/dL — ABNORMAL HIGH (ref 70–99)
Glucose-Capillary: 141 mg/dL — ABNORMAL HIGH (ref 70–99)
Glucose-Capillary: 141 mg/dL — ABNORMAL HIGH (ref 70–99)

## 2021-07-16 LAB — PROCALCITONIN: Procalcitonin: 0.28 ng/mL

## 2021-07-16 LAB — BRAIN NATRIURETIC PEPTIDE: B Natriuretic Peptide: 437.5 pg/mL — ABNORMAL HIGH (ref 0.0–100.0)

## 2021-07-16 MED ORDER — VITAL 1.5 CAL PO LIQD
1000.0000 mL | ORAL | Status: DC
  Administered 2021-07-16: 1000 mL
  Filled 2021-07-16 (×2): qty 1000

## 2021-07-16 NOTE — Progress Notes (Signed)
[facility] PROGRESS NOTE  Matheau Tait Balistreri ELF:[id number] DOB: [birth year] DOA: 04/29/2021  Patient goes by Vincent [name]   PCP: Pcp, No  Status: Remains inpatient appropriate because:Altered mental status and Unsafe d/c plan  Dispo: The patient is from: Home              Anticipated d/c is to: SNF-trach capable              Patient currently is medically stable to d/c.  Difficult to place patient Yes               Barriers to discharge: Medicaid funding-availability of trach capable facilities  Level of care: Progressive   Code Status: DNR Family Communication: Sister [name] on 10/5.  She is hopeful that he will get a bed at [facility] since that is only 45 minutes from her home DVT prophylaxis: Subcutaneous heparin COVID vaccination status: Unknown  HPI:  50 y.o. 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  He has had repetitive blinking concerning for seizure activity but EEG has been negative.  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  Since admission he has been treated for sepsis secondary to Blood culture showed Klebsiella and MSSA, he did complete a course of antibiotics.  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 Goals of care discussion held by palliative care team.  Patient's sister was adamant about continuing full scope of treatment although patient does remain a DNR   Subjective: Awake.  Positive eye contact and tracking.  Objective: Vitals:   07/15/21 2339 07/16/21 0359  BP: 104/75 109/77  Pulse: (!) 108 (!) 110  Resp: (!) 23 12  Temp:    SpO2: 99% 98%    Intake/Output Summary (Last 24 hours)  at 07/16/2021 0730 Last data filed at 07/15/2021 1653 Gross per 24 hour  Intake --  Output 800 ml  Net -800 ml   Filed Weights   07/13/21 0254 07/14/21 0407 07/15/21 0420  Weight: 66.3 kg 65.6 kg 66.1 kg    Exam:  Constitutional: Awake, eyes open, tracking.  Appears to be sleepy. Respiratory: #6.0 cuffed Shiley trach, stable on room air via TC.  Lung sounds to auscultation bilaterally, no increased work of breathing, pulse ox 94-98 %; trach changed by RT on 10/18 Cardiovascular: S1-S2, normotensive, stable tachycardia, appropriate capillary refill Abdomen: Nontender soft, normoactive bowel sounds, G-tube site unremarkable, LBM 10/17 Neurologic: CN 2-12 grossly intact-awake,   Tracking appropriately Psychiatric: Awake, nonverbal, unable to accurately assess  Significant Events: 8/2 PEA Arrest, King airway in the field, CPR x6 minutes and epi x1, ROSC, Intubated in ED, Admitted to [facility]  8/24 PEG placed 8/4 Weaned off TTM, Coox O2 satof 47%. 8/8 Post pyloric Cortrak placed, myoclonic jerking, started Keppra, depakote 8/10 Completed antibiotic course for pneumonia. Family meeting transitioned to DNR. 8/15 Tracheostomy  8/18 Febrile overnight, started Zosyn for aspiration pneumonia 8/22 Completed Abx course for aspiration pneumonia  8/24 PEG placed 8/31 PSV wean 2 hours 9/1 Stopped lasix, farxiga, losartan, spiro with AKI. Ongoing jerking, diaphoresis. WBC increased, cultured / abx started empirically.  PCT increased. CK 2,450 9/2 start scheduled opiates for possible opiate withdrawal symptoms 9/4 add flexeril,  on PSV.  Versed x1 overnight. 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: Continue Keppra Patient was not on SMRs so Skelaxin initiated on 10/18 2/2 increased spasticity EEG without  evidence of seizure activity  Fever secondary to ESBL Klebsiella tracheobronchitis (recurrent)/abnormal blood cultures with MRSE Last dose of meropenem will be 10/20  Blood culture likely contaminant --follow-up blood cultures negative  Acute respiratory failure with hypoxia in the setting of cardiac arrest and aspiration pneumonia: Trach team following prn --due to PVS not a candidate for decannulation  Suspected anxiety/pain Continue Tylenol prn, Oxy IR 2.5 mg prn and scheduled Klonopin  Injected sclera bilaterally Continue Zaditor eyedrops  Sepsis due to ESBL Klebsiella tracheobronchitis and ESBL Klebsiella UTI: Treated with meropenem; has had recurrence of tracheobronchitis-see above-sepsis physiology previously resolved   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  Hypovolemic hyponatremia: Resolved.    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  Nutrition: Status post PEG tube placement continue tube feedings and free water flushes.  Left upper lung nodule: Will need a follow-up CT in 3 months.  Hyperlipidemia: Continue Lipitor.   Goals of care: PVS-guarded prognosis -palliative care was consulted sister adamant about continuing full scope of treatment.         Data Reviewed: Basic Metabolic Panel: Recent Labs  Lab 07/12/21 0256 07/13/21 0256 07/14/21 0408 07/15/21 0314 07/16/21 0323  NA 132* 136 131* 134* 135  K 4.3 4.0 4.0 4.2 4.6  CL 98 101 98 102 102  CO2 23 25 24 24  21*  GLUCOSE 129* 156* 94 127* 121*  BUN 16 17 17 15 17   CREATININE 0.64 0.72 0.58* 0.62 0.63  CALCIUM 9.8 9.9 9.7 9.8 9.7  MG  --   --   --  2.1 1.9   Liver Function Tests: Recent Labs  Lab 07/10/21 0902 07/15/21 0314 07/16/21 0323  AST 35 31 41  ALT 100*  86* 115*  ALKPHOS 60 56 59  BILITOT 0.4 0.7 0.4  PROT 7.3 7.1 7.1  ALBUMIN 3.6 3.5 3.7    CBC: Recent Labs  Lab 07/12/21 0256 07/13/21 0256 07/14/21 0408 07/15/21 0314 07/16/21 0323  WBC 12.3* 10.6* 11.2* 10.8* 11.3*  NEUTROABS 6.9 6.1 5.5 5.7 6.6  HGB 13.7 12.7* 12.8* 13.3 13.6  HCT 41.2 39.5 39.7 41.1 43.8  MCV 87.5 88.8 89.4 88.8 93.6  PLT 431* 400 425* 393 403*    BNP (last 3 results) Recent Labs    06/09/21 0350 07/15/21 0314 07/16/21 0323  BNP 406.5* 346.8* 437.5*    CBG: Recent Labs  Lab 07/15/21 1136 07/15/21 1622 07/15/21 2011 07/16/21 0010 07/16/21 0401  GLUCAP 125* 91 113* 108* 125*       Scheduled Meds:  arformoterol  15 mcg Nebulization BID   atorvastatin  40 mg Per Tube QHS   budesonide (PULMICORT) nebulizer solution  0.5 mg Nebulization BID   chlorhexidine  15 mL Mouth Rinse BID   clonazePAM  0.5 mg Per Tube QHS   docusate  200 mg Per Tube BID   feeding supplement (PROSource TF)  45 mL Per Tube TID   fiber  1 packet Per Tube BID   free water  200 mL Per Tube Q6H  heparin  5,000 Units Subcutaneous Q8H   ketotifen  1 drop Both Eyes BID   levETIRAcetam  1,000 mg Per Tube BID   lisinopril  2.5 mg Per Tube Daily   mouth rinse  15 mL Mouth Rinse q12n4p   metaxalone  400 mg Oral TID   metoprolol tartrate  100 mg Per Tube BID   pantoprazole sodium  40 mg Per Tube QHS   polyethylene glycol  17 g Per Tube BID   polyvinyl alcohol  2 drop Both Eyes Q6H   revefenacin  175 mcg Nebulization Daily   senna  1 tablet Per Tube BID   Continuous Infusions:  sodium chloride 250 mL (06/15/21 1719)   feeding supplement (JEVITY 1.5 CAL/FIBER) 1,000 mL (07/15/21 2225)   meropenem (MERREM) IV 1 g (07/16/21 0657)    Active Problems:   Cardiac arrest (HCC)   Acute respiratory failure with hypoxia (HCC)   Seizure-like activity (HCC)   Anoxic encephalopathy (HCC)   Tracheostomy in place [facility])   Goals of care, counseling/discussion   AKI (acute kidney  injury) (HCC)   Tracheostomy dependence (HCC)   Persistent vegetative state (HCC)   Generalized anxiety disorder   Other chronic pain   Ogilvie syndrome   Gram-negative infection   Acute UTI   Urinary tract infection due to ESBL Klebsiella   Consultants: Neurology General surgery Interventional radiology PCCM  Procedures: 2D echocardiogram Multiple EEGs Cortrak placement PEG tube placement  Antibiotics: Azithromycin 8/2 through 8/3 Cefepime 8/2 through 8/3 Ceftriaxone 8/3 through 8/7 Vancomycin 8/2 x 1 dose Zosyn 8/18 x 1 Unasyn 8/19 through 8/21 Cefazolin 8/24 through 8/24 Fluconazole 8/25 through 9/3 Zosyn 8/25 x 1 dose Cefepime 9/1 x 1 dose Vancomycin 9/1 x 1 dose Azithromycin 9/8 through 9/12 Zosyn 9/12 x 1 dose Meropenem 9/13 through 9/20 Unasyn 10/14 x 1 dose Zosyn 10/15 x 1 dose Vancomycin 10/15 through 10/16 Meropenem 10/16 >>   Time spent: 15 minutes    Junious Silk ANP  [facility] 7 am - 330 pm/M-F for direct patient care and secure chat Please refer to Amion for contact info 78  days

## 2021-07-17 DIAGNOSIS — R403 Persistent vegetative state: Secondary | ICD-10-CM | POA: Diagnosis not present

## 2021-07-17 DIAGNOSIS — K5981 Ogilvie syndrome: Secondary | ICD-10-CM | POA: Diagnosis not present

## 2021-07-17 DIAGNOSIS — Z930 Tracheostomy status: Secondary | ICD-10-CM | POA: Diagnosis not present

## 2021-07-17 DIAGNOSIS — I469 Cardiac arrest, cause unspecified: Secondary | ICD-10-CM | POA: Diagnosis not present

## 2021-07-17 LAB — COMPREHENSIVE METABOLIC PANEL
ALT: 160 U/L — ABNORMAL HIGH (ref 0–44)
AST: 53 U/L — ABNORMAL HIGH (ref 15–41)
Albumin: 3.7 g/dL (ref 3.5–5.0)
Alkaline Phosphatase: 63 U/L (ref 38–126)
Anion gap: 10 (ref 5–15)
BUN: 17 mg/dL (ref 6–20)
CO2: 22 mmol/L (ref 22–32)
Calcium: 9.9 mg/dL (ref 8.9–10.3)
Chloride: 100 mmol/L (ref 98–111)
Creatinine, Ser: 0.58 mg/dL — ABNORMAL LOW (ref 0.61–1.24)
GFR, Estimated: >60 mL/min (ref >60)
Glucose, Bld: 116 mg/dL — ABNORMAL HIGH (ref 70–99)
Potassium: 4.3 mmol/L (ref 3.5–5.1)
Sodium: 132 mmol/L — ABNORMAL LOW (ref 135–145)
Total Bilirubin: 0.5 mg/dL (ref 0.3–1.2)
Total Protein: 7.5 g/dL (ref 6.5–8.1)

## 2021-07-17 LAB — GLUCOSE, CAPILLARY
Glucose-Capillary: 122 mg/dL — ABNORMAL HIGH (ref 70–99)
Glucose-Capillary: 128 mg/dL — ABNORMAL HIGH (ref 70–99)
Glucose-Capillary: 137 mg/dL — ABNORMAL HIGH (ref 70–99)
Glucose-Capillary: 137 mg/dL — ABNORMAL HIGH (ref 70–99)

## 2021-07-17 LAB — CBC WITH DIFFERENTIAL/PLATELET
Abs Immature Granulocytes: 0.13 10*3/uL — ABNORMAL HIGH (ref 0.00–0.07)
Basophils Absolute: 0.1 10*3/uL (ref 0.0–0.1)
Basophils Relative: 1 %
Eosinophils Absolute: 0.3 10*3/uL (ref 0.0–0.5)
Eosinophils Relative: 3 %
HCT: 41.1 % (ref 39.0–52.0)
Hemoglobin: 13.5 g/dL (ref 13.0–17.0)
Immature Granulocytes: 1 %
Lymphocytes Relative: 31 %
Lymphs Abs: 3.5 10*3/uL (ref 0.7–4.0)
MCH: 28.9 pg (ref 26.0–34.0)
MCHC: 32.8 g/dL (ref 30.0–36.0)
MCV: 88 fL (ref 80.0–100.0)
Monocytes Absolute: 1.2 10*3/uL — ABNORMAL HIGH (ref 0.1–1.0)
Monocytes Relative: 10 %
Neutro Abs: 6.1 10*3/uL (ref 1.7–7.7)
Neutrophils Relative %: 54 %
Platelets: 461 10*3/uL — ABNORMAL HIGH (ref 150–400)
RBC: 4.67 MIL/uL (ref 4.22–5.81)
RDW: 15.8 % — ABNORMAL HIGH (ref 11.5–15.5)
WBC: 11.3 10*3/uL — ABNORMAL HIGH (ref 4.0–10.5)
nRBC: 0 % (ref 0.0–0.2)

## 2021-07-17 LAB — MAGNESIUM: Magnesium: 2 mg/dL (ref 1.7–2.4)

## 2021-07-17 MED ORDER — PROSOURCE TF PO LIQD
45.0000 mL | Freq: Every day | ORAL | Status: DC
  Administered 2021-07-18 – 2021-07-22 (×5): 45 mL
  Filled 2021-07-17 (×5): qty 45

## 2021-07-17 MED ORDER — FREE WATER
125.0000 mL | Status: DC
  Administered 2021-07-17 – 2021-07-22 (×29): 125 mL

## 2021-07-17 MED ORDER — JEVITY 1.2 CAL PO LIQD
1000.0000 mL | ORAL | Status: DC
  Administered 2021-07-17 – 2021-07-22 (×8): 1000 mL
  Filled 2021-07-17 (×11): qty 1000

## 2021-07-17 NOTE — Progress Notes (Signed)
Nutrition Follow-up  DOCUMENTATION CODES:  Not applicable  INTERVENTION:  Continue TF via PEG: -Transition to Jevity 1.2 at 75 ml/h (1800 ml/d) -45ml Prosource TF daily -Free water flushes 125 ml every 4 hours  Provides 2200 kcals, 110 grams protein, 1452ml free water (2202ml total with flushes)  NUTRITION DIAGNOSIS:  Inadequate oral intake related to inability to eat as evidenced by NPO status. -- ongoing   GOAL:  Patient will meet greater than or equal to 90% of their needs -- met with TF  MONITOR:  Labs, Weight trends, TF tolerance, Skin, I & O's  REASON FOR ASSESSMENT:  Ventilator, Consult Enteral/tube feeding initiation and management  ASSESSMENT:  50 yo male admitted S/P PEA cardiac arrest at home. PMH includes tobacco abuse, emphysema, CVA, diabetes.  Follow-up cultures negative so blood culture likely contaminant per NP.   RD received page/secure chat from Pharmacy regarding current TF orders -- Jevity 1.5 is out of stock and TF regimen will need to be substituted. Pt had been tolerating the following via PEG: Jevity 1.5 at 60 ml/h with Prosource TF 45 ml TID and 200ml free water Q6H. Will adjust regimen given supply limitations.   Medications: Nutrisource fiber BID per tube, Colace, Protonix, Miralax, Senokot, Keppra, IV abx Labs: Na 132 (L) CBGs: 106-141 x 24 hours  Current weight 64.6 kg Admission weight 70 kg   UOP: 1750ml documented x24 hours  Diet Order:   Diet Order     None      EDUCATION NEEDS:  Not appropriate for education at this time  Skin:  Skin Assessment: Reviewed RN Assessment  Last BM:  10/20  Height:  Ht Readings from Last 1 Encounters:  05/26/21 5' 8" (1.727 m)   Weight:  Wt Readings from Last 1 Encounters:  07/17/21 64.6 kg   Ideal Body Weight:  70 kg  BMI:  Body mass index is 21.65 kg/m.  Estimated Nutritional Needs:  Kcal:  2000-2200 Protein:  110-125 gm Fluid:  >/= 2 L    , MS, RD, LDN  (she/her/hers) RD pager number and weekend/on-call pager number located in Amion.                                                     

## 2021-07-17 NOTE — Progress Notes (Signed)
[facility] PROGRESS NOTE  Vincent [name] IRW:[id number] DOB: 1970/11/29 DOA: 04/29/2021  Patient goes by Vincent [name]   PCP: Pcp, No  Status: Remains inpatient appropriate because:Altered mental status and Unsafe d/c plan  Dispo: The patient is from: Home              Anticipated d/c is to: SNF-trach capable              Patient currently is medically stable to d/c.  Difficult to place patient Yes               Barriers to discharge: Medicaid funding-availability of trach capable facilities  Level of care: Progressive   Code Status: DNR Family Communication: Sister Vincent [name] on 10/5.  She is hopeful that he will get a bed at [facility] since that is only 45 minutes from her home DVT prophylaxis: Subcutaneous heparin COVID vaccination status: Unknown  HPI:  50 y.o. 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  He has had repetitive blinking concerning for seizure activity but EEG has been negative.  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  Since admission he has been treated for sepsis secondary to Blood culture showed Klebsiella and MSSA, he did complete a course of antibiotics.  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 Goals of care discussion held by palliative care team.  Patient's sister was adamant about continuing full scope of treatment although patient does remain a DNR   Subjective: Awakens.  Makes eye contact.  Objective: Vitals:   07/17/21 0757 07/17/21 0758  BP:    Pulse:  (!) 107  Resp:  (!) 25  Temp:    SpO2: 100% 100%    Intake/Output Summary (Last 24 hours) at 07/17/2021 0801 Last data  filed at 07/17/2021 0540 Gross per 24 hour  Intake 2439.61 ml  Output 1750 ml  Net 689.61 ml   Filed Weights   07/14/21 0407 07/15/21 0420 07/17/21 0500  Weight: 65.6 kg 66.1 kg 64.6 kg    Exam:  Constitutional: Awake, eyes open, tracking.  Baseline bland affect Respiratory: #6.0 cuffed Shiley trach, stable on room air via TC.  Lung sounds to auscultation bilaterally, normal respiratory effort.  Pulse oximetry 8 to 100%. Cardiovascular: S1-S2, normotensive, stable tachycardia, appropriate capillary refill Abdomen: Nontender soft, normoactive bowel sounds, G-tube site unremarkable, LBM 10/20 Neurologic: CN 2-12 grossly intact-awake,   Tracking appropriately Psychiatric: Awake, nonverbal, unable to accurately assess  Significant Events: 8/2 PEA Arrest, King airway in the field, CPR x6 minutes and epi x1, ROSC, Intubated in ED, Admitted to [facility]  8/24 PEG placed 8/4 Weaned off TTM, [place] O2 satof 47%. 8/8 Post pyloric Cortrak placed, myoclonic jerking, started Keppra, depakote 8/10 Completed antibiotic course for pneumonia. Family meeting transitioned to DNR. 8/15 Tracheostomy  8/18 Febrile overnight, started Zosyn for aspiration pneumonia 8/22 Completed Abx course for aspiration pneumonia  8/24 PEG placed 8/31 PSV wean 2 hours 9/1 Stopped lasix, farxiga, losartan, spiro with AKI. Ongoing jerking, diaphoresis. WBC increased, cultured / abx started empirically.  PCT increased. CK 2,450 9/2 start scheduled opiates for possible opiate withdrawal symptoms 9/4 add flexeril, on PSV.  Versed x1 overnight. 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: Continue Keppra Patient was not on SMRs so Skelaxin initiated on 10/18 2/2 increased spasticity EEG without evidence of seizure activity  Fever  secondary to ESBL Klebsiella tracheobronchitis (recurrent)/abnormal blood cultures with MRSE Last dose of meropenem today 10/20  Blood culture likely contaminant --follow-up blood cultures negative  Acute respiratory failure with hypoxia in the setting of cardiac arrest and aspiration pneumonia: Trach team following prn --due to PVS not a candidate for decannulation  Suspected anxiety/pain Continue Tylenol prn, Oxy IR 2.5 mg prn and scheduled Klonopin  Injected sclera bilaterally Continue Zaditor eyedrops  Sepsis due to ESBL Klebsiella tracheobronchitis and ESBL Klebsiella UTI: Treated with meropenem; has had recurrence of tracheobronchitis-see above-sepsis physiology previously resolved   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  Hypovolemic hyponatremia: Resolved.    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  Nutrition: Status post PEG tube placement continue tube feedings and free water flushes.  Left upper lung nodule: Will need a follow-up CT in 3 months.  Hyperlipidemia: Continue Lipitor.   Goals of care: PVS-guarded prognosis -palliative care was consulted sister adamant about continuing full scope of treatment.         Data Reviewed: Basic Metabolic Panel: Recent Labs  Lab 07/13/21 0256 07/14/21 0408 07/15/21 0314 07/16/21 0323 07/17/21 0308  NA 136 131* 134* 135 132*  K 4.0 4.0 4.2 4.6 4.3  CL 101 98 102 102 100  CO2 25 24 24  21* 22  GLUCOSE 156* 94 127* 121* 116*  BUN 17 17 15 17 17   CREATININE 0.72 0.58* 0.62 0.63 0.58*  CALCIUM 9.9 9.7 9.8 9.7 9.9  MG  --   --  2.1 1.9 2.0   Liver Function Tests: Recent Labs  Lab 07/10/21 0902 07/15/21 0314 07/16/21 0323 07/17/21 0308  AST 35 31 41 53*  ALT 100* 86* 115* 160*   ALKPHOS 60 56 59 63  BILITOT 0.4 0.7 0.4 0.5  PROT 7.3 7.1 7.1 7.5  ALBUMIN 3.6 3.5 3.7 3.7    CBC: Recent Labs  Lab 07/13/21 0256 07/14/21 0408 07/15/21 0314 07/16/21 0323 07/17/21 0308  WBC 10.6* 11.2* 10.8* 11.3* 11.3*  NEUTROABS 6.1 5.5 5.7 6.6 6.1  HGB 12.7* 12.8* 13.3 13.6 13.5  HCT 39.5 39.7 41.1 43.8 41.1  MCV 88.8 89.4 88.8 93.6 88.0  PLT 400 425* 393 403* 461*    BNP (last 3 results) Recent Labs    06/09/21 0350 07/15/21 0314 07/16/21 0323  BNP 406.5* 346.8* 437.5*    CBG: Recent Labs  Lab 07/16/21 1157 07/16/21 2021 07/16/21 2328 07/17/21 0516 07/17/21 0755  GLUCAP 141* 141* 106* 122* 128*       Scheduled Meds:  arformoterol  15 mcg Nebulization BID   atorvastatin  40 mg Per Tube QHS   budesonide (PULMICORT) nebulizer solution  0.5 mg Nebulization BID   chlorhexidine  15 mL Mouth Rinse BID   clonazePAM  0.5 mg Per Tube QHS   docusate  200 mg Per Tube BID   feeding supplement (PROSource TF)  45 mL Per Tube TID   fiber  1 packet Per Tube BID   free water  200 mL Per Tube Q6H   heparin  5,000  Units Subcutaneous Q8H   ketotifen  1 drop Both Eyes BID   levETIRAcetam  1,000 mg Per Tube BID   lisinopril  2.5 mg Per Tube Daily   mouth rinse  15 mL Mouth Rinse q12n4p   metaxalone  400 mg Oral TID   metoprolol tartrate  100 mg Per Tube BID   pantoprazole sodium  40 mg Per Tube QHS   polyethylene glycol  17 g Per Tube BID   polyvinyl alcohol  2 drop Both Eyes Q6H   revefenacin  175 mcg Nebulization Daily   senna  1 tablet Per Tube BID   Continuous Infusions:  sodium chloride 250 mL (06/15/21 1719)   feeding supplement (VITAL 1.5 CAL) 1,000 mL (07/16/21 2256)   meropenem (MERREM) IV 1 g (07/17/21 0540)    Active Problems:   Cardiac arrest (HCC)   Acute respiratory failure with hypoxia (HCC)   Seizure-like activity (HCC)   Anoxic encephalopathy (HCC)   Tracheostomy in place [facility])   Goals of care, counseling/discussion   AKI (acute kidney  injury) (HCC)   Tracheostomy dependence (HCC)   Persistent vegetative state (HCC)   Generalized anxiety disorder   Other chronic pain   Ogilvie syndrome   Gram-negative infection   Acute UTI   Urinary tract infection due to ESBL Klebsiella   Consultants: Neurology General surgery Interventional radiology PCCM  Procedures: 2D echocardiogram Multiple EEGs Cortrak placement PEG tube placement  Antibiotics: Azithromycin 8/2 through 8/3 Cefepime 8/2 through 8/3 Ceftriaxone 8/3 through 8/7 Vancomycin 8/2 x 1 dose Zosyn 8/18 x 1 Unasyn 8/19 through 8/21 Cefazolin 8/24 through 8/24 Fluconazole 8/25 through 9/3 Zosyn 8/25 x 1 dose Cefepime 9/1 x 1 dose Vancomycin 9/1 x 1 dose Azithromycin 9/8 through 9/12 Zosyn 9/12 x 1 dose Meropenem 9/13 through 9/20 Unasyn 10/14 x 1 dose Zosyn 10/15 x 1 dose Vancomycin 10/15 through 10/16 Meropenem 10/16 >>   Time spent: 15 minutes    Junious Silk ANP  [facility] 7 am - 330 pm/M-F for direct patient care and secure chat Please refer to Amion for contact info 79  days

## 2021-07-17 NOTE — Plan of Care (Signed)
  Problem: Role Relationship: Goal: Method of communication will improve Outcome: Progressing   Problem: Education: Goal: Knowledge of General Education information will improve Description: Including pain rating scale, medication(s)/side effects and non-pharmacologic comfort measures Outcome: Progressing   Problem: Health Behavior/Discharge Planning: Goal: Ability to manage health-related needs will improve Outcome: Progressing   

## 2021-07-18 ENCOUNTER — Inpatient Hospital Stay (HOSPITAL_COMMUNITY): Payer: Medicaid Other

## 2021-07-18 DIAGNOSIS — I469 Cardiac arrest, cause unspecified: Secondary | ICD-10-CM | POA: Diagnosis not present

## 2021-07-18 DIAGNOSIS — G931 Anoxic brain damage, not elsewhere classified: Secondary | ICD-10-CM | POA: Diagnosis not present

## 2021-07-18 DIAGNOSIS — Z930 Tracheostomy status: Secondary | ICD-10-CM | POA: Diagnosis not present

## 2021-07-18 DIAGNOSIS — R403 Persistent vegetative state: Secondary | ICD-10-CM | POA: Diagnosis not present

## 2021-07-18 LAB — GLUCOSE, CAPILLARY
Glucose-Capillary: 110 mg/dL — ABNORMAL HIGH (ref 70–99)
Glucose-Capillary: 116 mg/dL — ABNORMAL HIGH (ref 70–99)
Glucose-Capillary: 119 mg/dL — ABNORMAL HIGH (ref 70–99)
Glucose-Capillary: 124 mg/dL — ABNORMAL HIGH (ref 70–99)
Glucose-Capillary: 127 mg/dL — ABNORMAL HIGH (ref 70–99)
Glucose-Capillary: 130 mg/dL — ABNORMAL HIGH (ref 70–99)
Glucose-Capillary: 135 mg/dL — ABNORMAL HIGH (ref 70–99)

## 2021-07-18 LAB — COMPREHENSIVE METABOLIC PANEL
ALT: 208 U/L — ABNORMAL HIGH (ref 0–44)
AST: 62 U/L — ABNORMAL HIGH (ref 15–41)
Albumin: 3.8 g/dL (ref 3.5–5.0)
Alkaline Phosphatase: 62 U/L (ref 38–126)
Anion gap: 9 (ref 5–15)
BUN: 16 mg/dL (ref 6–20)
CO2: 24 mmol/L (ref 22–32)
Calcium: 10.1 mg/dL (ref 8.9–10.3)
Chloride: 101 mmol/L (ref 98–111)
Creatinine, Ser: 0.57 mg/dL — ABNORMAL LOW (ref 0.61–1.24)
GFR, Estimated: >60 mL/min (ref >60)
Glucose, Bld: 110 mg/dL — ABNORMAL HIGH (ref 70–99)
Potassium: 4.4 mmol/L (ref 3.5–5.1)
Sodium: 134 mmol/L — ABNORMAL LOW (ref 135–145)
Total Bilirubin: 0.5 mg/dL (ref 0.3–1.2)
Total Protein: 7.4 g/dL (ref 6.5–8.1)

## 2021-07-18 LAB — CBC WITH DIFFERENTIAL/PLATELET
Abs Immature Granulocytes: 0.16 10*3/uL — ABNORMAL HIGH (ref 0.00–0.07)
Basophils Absolute: 0.1 10*3/uL (ref 0.0–0.1)
Basophils Relative: 1 %
Eosinophils Absolute: 0.2 10*3/uL (ref 0.0–0.5)
Eosinophils Relative: 2 %
HCT: 43 % (ref 39.0–52.0)
Hemoglobin: 14.1 g/dL (ref 13.0–17.0)
Immature Granulocytes: 1 %
Lymphocytes Relative: 31 %
Lymphs Abs: 3.5 10*3/uL (ref 0.7–4.0)
MCH: 28.8 pg (ref 26.0–34.0)
MCHC: 32.8 g/dL (ref 30.0–36.0)
MCV: 87.9 fL (ref 80.0–100.0)
Monocytes Absolute: 1.4 10*3/uL — ABNORMAL HIGH (ref 0.1–1.0)
Monocytes Relative: 12 %
Neutro Abs: 6 10*3/uL (ref 1.7–7.7)
Neutrophils Relative %: 53 %
Platelets: 445 10*3/uL — ABNORMAL HIGH (ref 150–400)
RBC: 4.89 MIL/uL (ref 4.22–5.81)
RDW: 15.4 % (ref 11.5–15.5)
WBC: 11.5 10*3/uL — ABNORMAL HIGH (ref 4.0–10.5)
nRBC: 0 % (ref 0.0–0.2)

## 2021-07-18 LAB — MAGNESIUM: Magnesium: 1.9 mg/dL (ref 1.7–2.4)

## 2021-07-18 MED ORDER — POLYETHYLENE GLYCOL 3350 17 G PO PACK: 17.0000 g | PACK | Freq: Every day | ORAL | Status: DC | PRN

## 2021-07-18 MED ORDER — DOCUSATE SODIUM 50 MG/5ML PO LIQD: 200.0000 mg | Freq: Two times a day (BID) | ORAL | Status: DC | PRN

## 2021-07-18 NOTE — Progress Notes (Addendum)
[facility] PROGRESS NOTE  Vincent [name] ZSW:[id number] DOB: 1971-05-10 DOA: 04/29/2021  Patient goes by Vincent [name]   PCP: Pcp, No  Status: Remains inpatient appropriate because:Altered mental status and Unsafe d/c plan  Dispo: The patient is from: Home              Anticipated d/c is to: SNF-trach capable-[place] Monday 10/24              Patient currently is medically stable to d/c.  Difficult to place patient Yes               Barriers to discharge: Medicaid funding-availability of trach capable facilities  Level of care: Progressive   Code Status: DNR Family Communication: SW updated Sister [name] about plan discharge early next week DVT prophylaxis: Subcutaneous heparin COVID vaccination status: Unknown  HPI:  50 y.o. 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  He has had repetitive blinking concerning for seizure activity but EEG has been negative.  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  Since admission he has been treated for sepsis secondary to Blood culture showed Klebsiella and MSSA, he did complete a course of antibiotics.  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 Goals of care discussion held by palliative care team.  Patient's sister was adamant about continuing full scope of treatment although patient does remain a DNR   Subjective: Seems somewhat mildly distressed this morning.  Continues to make eye contact.  Forehead and scalp somewhat diaphoretic and patient with more increased work of breathing/abdominal breathing than baseline.  Reposition patient and this did improve some of the  symptoms.  Objective: Vitals:   07/18/21 0400 07/18/21 0415  BP: 112/89 118/89  Pulse: (!) 114 (!) 101  Resp: (!) 26 20  Temp: 98.4 F (36.9 C) 98.3 F (36.8 C)  SpO2: 93%     Intake/Output Summary (Last 24 hours) at 07/18/2021 0741 Last data filed at 07/17/2021 1837 Gross per 24 hour  Intake 830.25 ml  Output 1400 ml  Net -569.75 ml   Filed Weights   07/15/21 0420 07/17/21 0500 07/18/21 0500  Weight: 66.1 kg 64.6 kg 64.4 kg    Exam:  Constitutional: Awake, eyes open, tracking.  Baseline bland affect-scalp and forehead diaphoretic Respiratory: #6.0 cuffed Shiley trach, stable on room air via TC.  Lung sounds are coarse to auscultation bilaterally, mildly abnormal respiratory effort with increased abdominal breathe.  Pulse oximetry 99 to 100%. Cardiovascular: S1-S2, normotensive, stable tachycardia, appropriate capillary refill Abdomen: Nontender soft, normoactive bowel sounds, G-tube site unremarkable, LBM 10/21-reports patient has been having frequent loose stools in context of scheduled laxative Neurologic: CN 2-12 grossly intact-awake,   Tracking appropriately Psychiatric: Awake, nonverbal, unable to accurately assess  Significant Events: 8/2 PEA Arrest, King airway in the field, CPR x6 minutes and epi x1, ROSC, Intubated in ED, Admitted to [facility]  8/24 PEG placed 8/4 Weaned off TTM, Co ox O2 sat of 47%. 8/8 Post pyloric Cortrak placed, myoclonic jerking, started Keppra, depakote 8/10 Completed antibiotic course for pneumonia. Family meeting transitioned to DNR. 8/15 Tracheostomy  8/18 Febrile overnight, started Zosyn for aspiration pneumonia 8/22 Completed Abx course for aspiration pneumonia  8/24 PEG placed 8/31 PSV wean 2 hours 9/1 Stopped lasix, farxiga, losartan, spiro with AKI. Ongoing jerking, diaphoresis. WBC increased, cultured / abx started empirically.  PCT increased. CK 2,450 9/2 start scheduled opiates for possible opiate withdrawal symptoms 9/4 add  flexeril, on PSV.  Versed x1 overnight. 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: Continue Keppra Patient was not on SMRs so Skelaxin initiated on 10/18 due to increased spasticity EEG without evidence of seizure activity  Fever secondary to ESBL Klebsiella tracheobronchitis (recurrent)/abnormal blood cultures with MRSE Last dose of meropenem today 10/20  Blood culture likely contaminant --follow-up blood cultures negative  Acute respiratory failure with hypoxia in the setting of cardiac arrest and aspiration pneumonia: Trach team following prn --due to PVS not a candidate for decannulation 10/21 mildly diaphoretic today with increased abdominal breathing that was likely secondary to discomfort and restlessness from current bed positioning-symptoms improved with nurse repositioning of patient WBC 11.5 without left shift, X over the past 24 hours 99.2 axillary  Suspected anxiety/pain Continue Tylenol prn, Oxy IR 2.5 mg prn and scheduled Klonopin Given diaphoresis we will also check bladder scan to ensure no urinary retention contributing  Loose stools Discontinue any scheduled laxatives or stool softeners  Injected sclera bilaterally Continue Zaditor eyedrops  Sepsis due to ESBL Klebsiella tracheobronchitis and ESBL Klebsiella UTI: Treated with meropenem; has had recurrence of tracheobronchitis-see above-sepsis physiology previously resolved   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  Hypovolemic hyponatremia: Resolved.    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  Nutrition: Status post PEG tube placement continue tube feedings and free water flushes.  Left upper lung nodule: Will need a follow-up CT in 3 months.  Hyperlipidemia: Continue Lipitor.   Goals of care: PVS-guarded prognosis -palliative care was consulted sister adamant about continuing full scope of treatment. 10/21 Physician [facility] advisor requesting FU palliative consult         Data Reviewed: Basic Metabolic Panel: Recent Labs  Lab 07/14/21 0408 07/15/21 0314 07/16/21 0323 07/17/21 0308 07/18/21 0231  NA 131* 134* 135 132* 134*  K 4.0 4.2 4.6 4.3 4.4  CL 98 102 102 100 101  CO2 24 24 21* 22 24  GLUCOSE 94 127* 121* 116* 110*  BUN 17 15 17 17 16   CREATININE 0.58* 0.62 0.63 0.58* 0.57*  CALCIUM 9.7 9.8 9.7 9.9 10.1  MG  --  2.1 1.9 2.0 1.9   Liver Function Tests: Recent Labs  Lab 07/15/21 0314 07/16/21 0323 07/17/21 0308 07/18/21 0231  AST 31 41 53* 62*  ALT 86* 115* 160* 208*  ALKPHOS 56 59 63 62  BILITOT 0.7 0.4 0.5 0.5  PROT 7.1 7.1 7.5 7.4  ALBUMIN 3.5 3.7 3.7 3.8    CBC: Recent Labs  Lab 07/14/21 0408 07/15/21 0314 07/16/21 0323 07/17/21 0308 07/18/21 0231  WBC 11.2* 10.8* 11.3* 11.3* 11.5*  NEUTROABS 5.5 5.7 6.6 6.1 6.0  HGB 12.8* 13.3 13.6 13.5 14.1  HCT 39.7 41.1 43.8 41.1 43.0  MCV 89.4 88.8 93.6 88.0 87.9  PLT 425* 393 403* 461* 445*    BNP (last 3 results) Recent Labs    06/09/21 0350 07/15/21 0314 07/16/21 0323  BNP 406.5* 346.8* 437.5*  CBG: Recent Labs  Lab 07/17/21 0755 07/17/21 1150 07/17/21 1651 07/18/21 0009 07/18/21 0418  GLUCAP 128* 137* 137* 127* 119*       Scheduled Meds:  arformoterol  15 mcg Nebulization BID   atorvastatin  40 mg Per Tube QHS   budesonide (PULMICORT) nebulizer solution  0.5 mg Nebulization BID   chlorhexidine  15 mL Mouth Rinse BID   clonazePAM  0.5 mg Per Tube QHS   docusate  200 mg Per Tube BID   feeding supplement (PROSource TF)  45 mL Per Tube Daily    fiber  1 packet Per Tube BID   free water  125 mL Per Tube Q4H   heparin  5,000 Units Subcutaneous Q8H   ketotifen  1 drop Both Eyes BID   levETIRAcetam  1,000 mg Per Tube BID   lisinopril  2.5 mg Per Tube Daily   mouth rinse  15 mL Mouth Rinse q12n4p   metaxalone  400 mg Oral TID   metoprolol tartrate  100 mg Per Tube BID   pantoprazole sodium  40 mg Per Tube QHS   polyethylene glycol  17 g Per Tube BID   polyvinyl alcohol  2 drop Both Eyes Q6H   revefenacin  175 mcg Nebulization Daily   senna  1 tablet Per Tube BID   Continuous Infusions:  sodium chloride 250 mL (06/15/21 1719)   feeding supplement (JEVITY 1.2 CAL) 1,000 mL (07/17/21 2147)    Active Problems:   Cardiac arrest (HCC)   Acute respiratory failure with hypoxia (HCC)   Seizure-like activity (HCC)   Anoxic encephalopathy (HCC)   Tracheostomy in place [facility])   Goals of care, counseling/discussion   AKI (acute kidney injury) (HCC)   Tracheostomy dependence (HCC)   Persistent vegetative state (HCC)   Generalized anxiety disorder   Other chronic pain   Ogilvie syndrome   Gram-negative infection   Acute UTI   Urinary tract infection due to ESBL Klebsiella   Consultants: Neurology General surgery Interventional radiology PCCM  Procedures: 2D echocardiogram Multiple EEGs Cortrak placement PEG tube placement  Antibiotics: Azithromycin 8/2 through 8/3 Cefepime 8/2 through 8/3 Ceftriaxone 8/3 through 8/7 Vancomycin 8/2 x 1 dose Zosyn 8/18 x 1 Unasyn 8/19 through 8/21 Cefazolin 8/24 through 8/24 Fluconazole 8/25 through 9/3 Zosyn 8/25 x 1 dose Cefepime 9/1 x 1 dose Vancomycin 9/1 x 1 dose Azithromycin 9/8 through 9/12 Zosyn 9/12 x 1 dose Meropenem 9/13 through 9/20 Unasyn 10/14 x 1 dose Zosyn 10/15 x 1 dose Vancomycin 10/15 through 10/16 Meropenem 10/16 >> 10/20   Time spent: 15 minutes    Junious Silk ANP  [facility] 7 am - 330 pm/M-F for direct patient care and secure  chat Please refer to Amion for contact info 80  days

## 2021-07-18 NOTE — Progress Notes (Addendum)
12:15pm: CSW spoke with Danetta at [employer] [employer] to initiate insurance authorization. CSW faxed clinicals to [phone].  11:15am: CSW spoke with [place] at [facility] in [place] who states the facility can offer the patient a bed. Drema Pry states the facility can accept him on Monday or Tuesday.  NP placed order for COVID test to be completed on Sunday 10/23.  CSW spoke with [name] who is agreeable to placement at [facility].   CSW will initiate insurance authorization.  Edwin Dada, MSW, LCSW Transitions of Care  Clinical Social Worker II [phone]

## 2021-07-19 DIAGNOSIS — J9601 Acute respiratory failure with hypoxia: Secondary | ICD-10-CM | POA: Diagnosis not present

## 2021-07-19 LAB — CULTURE, BLOOD (ROUTINE X 2)
Culture: NO GROWTH
Culture: NO GROWTH
Special Requests: ADEQUATE
Special Requests: ADEQUATE

## 2021-07-19 LAB — GLUCOSE, CAPILLARY
Glucose-Capillary: 149 mg/dL — ABNORMAL HIGH (ref 70–99)
Glucose-Capillary: 133 mg/dL — ABNORMAL HIGH (ref 70–99)
Glucose-Capillary: 106 mg/dL — ABNORMAL HIGH (ref 70–99)
Glucose-Capillary: 134 mg/dL — ABNORMAL HIGH (ref 70–99)
Glucose-Capillary: 119 mg/dL — ABNORMAL HIGH (ref 70–99)

## 2021-07-19 NOTE — Plan of Care (Signed)
  Problem: Role Relationship: Goal: Method of communication will improve Outcome: Progressing   Problem: Education: Goal: Knowledge of General Education information will improve Description: Including pain rating scale, medication(s)/side effects and non-pharmacologic comfort measures Outcome: Progressing   Problem: Health Behavior/Discharge Planning: Goal: Ability to manage health-related needs will improve Outcome: Progressing   Problem: Clinical Measurements: Goal: Ability to maintain clinical measurements within normal limits will improve Outcome: Progressing Goal: Will remain free from infection Outcome: Progressing Goal: Diagnostic test results will improve Outcome: Progressing Goal: Respiratory complications will improve Outcome: Progressing Goal: Cardiovascular complication will be avoided Outcome: Progressing   Problem: Activity: Goal: Risk for activity intolerance will decrease Outcome: Progressing   Problem: Nutrition: Goal: Adequate nutrition will be maintained Outcome: Progressing   Problem: Coping: Goal: Level of anxiety will decrease Outcome: Progressing   Problem: Elimination: Goal: Will not experience complications related to bowel motility Outcome: Progressing Goal: Will not experience complications related to urinary retention Outcome: Progressing   Problem: Pain Managment: Goal: General experience of comfort will improve Outcome: Progressing   Problem: Safety: Goal: Ability to remain free from injury will improve Outcome: Progressing   Problem: Skin Integrity: Goal: Risk for impaired skin integrity will decrease Outcome: Progressing

## 2021-07-19 NOTE — Progress Notes (Signed)
[facility] PROGRESS NOTE  Javontae Cameron Schwinn OZH:[id number] DOB: December 14, 1970 DOA: 04/29/2021  Patient goes by Vincent [name]   PCP: Pcp, No  Status: Remains inpatient appropriate because:Altered mental status and Unsafe d/c plan  Dispo: The patient is from: Home              Anticipated d/c is to: SNF-trach capable-[place] Monday 10/24              Patient currently is medically stable to d/c.  Difficult to place patient Yes               Barriers to discharge: Medicaid funding-availability of trach capable facilities  Level of care: Progressive   Code Status: DNR  HPI:  50 y.o. 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  Patient's sister was adamant about continuing full scope of treatment although patient does remain a DNR   Subjective: Makes good eye contact-- does not speak, no area of localized pain  Objective: Vitals:   07/19/21 0800 07/19/21 0904  BP: (!) 154/105 110/79  Pulse: (!) 112 (!) 111  Resp: 20 (!) 23  Temp:    SpO2: 100% 97%    Intake/Output Summary (Last 24 hours) at 07/19/2021 0917 Last data filed at 07/19/2021 0357 Gross per 24 hour  Intake --  Output 2600 ml  Net -2600 ml   Filed Weights   07/17/21 0500 07/18/21 0500 07/19/21 0500  Weight: 64.6 kg 64.4 kg 64.4 kg    Exam:  General: Appearance:    Chronically ill appearing male in no acute distress     Lungs:     trach  Heart:    Tachycardic. (Chart reviewed, patient always has mild tachycardia)     Neurologic:   Awake, alert, non-verbal    Significant Events: 8/2 PEA Arrest, King airway in the field, CPR x6 minutes and epi x1, ROSC, Intubated in ED, Admitted to [facility]  8/24 PEG placed 8/4 Weaned off TTM, Co ox O2 sat of 47%. 8/8 Post pyloric Cortrak placed, myoclonic  jerking, started Keppra, depakote 8/10 Completed antibiotic course for pneumonia. Family meeting transitioned to DNR. 8/15 Tracheostomy  8/18 Febrile overnight, started Zosyn for aspiration pneumonia 8/22 Completed Abx course for aspiration pneumonia  8/24 PEG placed 8/31 PSV wean 2 hours 9/1 Stopped lasix, farxiga, losartan, spiro with AKI. Ongoing jerking, diaphoresis. WBC increased, cultured / abx started empirically.  PCT increased. CK 2,450 9/2 start scheduled opiates for possible opiate withdrawal symptoms 9/4 add flexeril, on PSV.  Versed x1 overnight. 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: Continue Keppra Patient was not on SMRs so Skelaxin initiated on 10/18 due to increased spasticity EEG without evidence of seizure activity  Fever secondary to ESBL Klebsiella tracheobronchitis (recurrent)/abnormal blood cultures with MRSE S/p  Last dose of meropenem 10/20  Blood culture likely contaminant  as follow-up blood cultures negative  Acute respiratory failure with hypoxia in the setting of cardiac arrest and aspiration pneumonia: Trach team following prn --due to PVS not a candidate for decannulation  Suspected anxiety/pain Continue Tylenol prn, Oxy IR 2.5 mg prn and scheduled Klonopin  Loose stools Discontinue any scheduled laxatives  or stool softeners  Injected sclera bilaterally Continue Zaditor eyedrops  Sepsis due to ESBL Klebsiella tracheobronchitis and ESBL Klebsiella UTI: Treated with meropenem; has had recurrence of tracheobronchitis-see above-sepsis physiology previously resolved   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  Hypovolemic hyponatremia: Resolved.     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  Nutrition: Status post PEG tube placement continue tube feedings and free water flushes.  Left upper lung nodule: Will need a follow-up CT in 3 months.  Hyperlipidemia: Continue Lipitor.   Goals of care: PVS-guarded prognosis -palliative care was consulted sister adamant about continuing full scope of treatment.        Data Reviewed: Basic Metabolic Panel: Recent Labs  Lab 07/14/21 0408 07/15/21 0314 07/16/21 0323 07/17/21 0308 07/18/21 0231  NA 131* 134* 135 132* 134*  K 4.0 4.2 4.6 4.3 4.4  CL 98 102 102 100 101  CO2 24 24 21* 22 24  GLUCOSE 94 127* 121* 116* 110*  BUN 17 15 17 17 16   CREATININE 0.58* 0.62 0.63 0.58* 0.57*  CALCIUM 9.7 9.8 9.7 9.9 10.1  MG  --  2.1 1.9 2.0 1.9   Liver Function Tests: Recent Labs  Lab 07/15/21 0314 07/16/21 0323 07/17/21 0308 07/18/21 0231  AST 31 41 53* 62*  ALT 86* 115* 160* 208*  ALKPHOS 56 59 63 62  BILITOT 0.7 0.4 0.5 0.5  PROT 7.1 7.1 7.5 7.4  ALBUMIN 3.5 3.7 3.7 3.8    CBC: Recent Labs  Lab 07/14/21 0408 07/15/21 0314 07/16/21 0323 07/17/21 0308 07/18/21 0231  WBC 11.2* 10.8* 11.3* 11.3* 11.5*  NEUTROABS 5.5 5.7 6.6 6.1 6.0  HGB 12.8* 13.3 13.6 13.5 14.1  HCT 39.7 41.1 43.8 41.1 43.0  MCV 89.4 88.8 93.6 88.0 87.9  PLT 425* 393 403* 461* 445*    BNP (last 3 results) Recent Labs    06/09/21 0350 07/15/21 0314 07/16/21 0323  BNP 406.5* 346.8* 437.5*    CBG: Recent Labs  Lab 07/18/21 1209 07/18/21 1634 07/18/21 1910 07/18/21 2311 07/19/21 0729  GLUCAP 130* 116* 124* 110* 149*       Scheduled Meds:  arformoterol  15 mcg Nebulization BID   atorvastatin  40 mg Per Tube QHS   budesonide (PULMICORT) nebulizer solution  0.5 mg Nebulization BID   chlorhexidine  15 mL Mouth Rinse BID   clonazePAM  0.5 mg Per Tube QHS   feeding  supplement (PROSource TF)  45 mL Per Tube Daily   fiber  1 packet Per Tube BID   free water  125 mL Per Tube Q4H   heparin  5,000 Units Subcutaneous Q8H   ketotifen  1 drop Both Eyes BID   levETIRAcetam  1,000 mg Per Tube BID   lisinopril  2.5 mg Per Tube Daily   mouth rinse  15 mL Mouth Rinse q12n4p   metaxalone  400 mg Oral TID   metoprolol tartrate  100 mg Per Tube BID   pantoprazole sodium  40 mg Per Tube QHS   polyvinyl alcohol  2 drop Both Eyes Q6H   revefenacin  175 mcg Nebulization Daily   senna  1 tablet Per Tube BID   Continuous Infusions:  sodium chloride 250 mL (06/15/21 1719)   feeding supplement (JEVITY 1.2 CAL) 1,000 mL (07/18/21 1310)    Active Problems:   Cardiac arrest (HCC)  Acute respiratory failure with hypoxia (HCC)   Seizure-like activity (HCC)   Anoxic encephalopathy (HCC)   Tracheostomy in place [facility])   Goals of care, counseling/discussion   AKI (acute kidney injury) (HCC)   Tracheostomy dependence (HCC)   Persistent vegetative state (HCC)   Generalized anxiety disorder   Other chronic pain   Ogilvie syndrome   Gram-negative infection   Acute UTI   Urinary tract infection due to ESBL Klebsiella   Consultants: Neurology General surgery Interventional radiology PCCM  Procedures: 2D echocardiogram Multiple EEGs Cortrak placement PEG tube placement  Antibiotics: Azithromycin 8/2 through 8/3 Cefepime 8/2 through 8/3 Ceftriaxone 8/3 through 8/7 Vancomycin 8/2 x 1 dose Zosyn 8/18 x 1 Unasyn 8/19 through 8/21 Cefazolin 8/24 through 8/24 Fluconazole 8/25 through 9/3 Zosyn 8/25 x 1 dose Cefepime 9/1 x 1 dose Vancomycin 9/1 x 1 dose Azithromycin 9/8 through 9/12 Zosyn 9/12 x 1 dose Meropenem 9/13 through 9/20 Unasyn 10/14 x 1 dose Zosyn 10/15 x 1 dose Vancomycin 10/15 through 10/16 Meropenem 10/16 >> 10/20   Time spent: 15 minutes    Joseph Art DO [facility] Please refer to Amion for contact info 81   days

## 2021-07-20 DIAGNOSIS — J9601 Acute respiratory failure with hypoxia: Secondary | ICD-10-CM | POA: Diagnosis not present

## 2021-07-20 LAB — RESP PANEL BY RT-PCR (FLU A&B, COVID) ARPGX2
Influenza A by PCR: NEGATIVE
Influenza B by PCR: NEGATIVE
SARS Coronavirus 2 by RT PCR: NEGATIVE

## 2021-07-20 LAB — GLUCOSE, CAPILLARY
Glucose-Capillary: 141 mg/dL — ABNORMAL HIGH (ref 70–99)
Glucose-Capillary: 140 mg/dL — ABNORMAL HIGH (ref 70–99)
Glucose-Capillary: 135 mg/dL — ABNORMAL HIGH (ref 70–99)
Glucose-Capillary: 123 mg/dL — ABNORMAL HIGH (ref 70–99)
Glucose-Capillary: 103 mg/dL — ABNORMAL HIGH (ref 70–99)

## 2021-07-20 NOTE — Plan of Care (Signed)
  Problem: Role Relationship: Goal: Method of communication will improve Outcome: Progressing   Problem: Education: Goal: Knowledge of General Education information will improve Description: Including pain rating scale, medication(s)/side effects and non-pharmacologic comfort measures Outcome: Progressing   Problem: Health Behavior/Discharge Planning: Goal: Ability to manage health-related needs will improve Outcome: Progressing   Problem: Clinical Measurements: Goal: Ability to maintain clinical measurements within normal limits will improve Outcome: Progressing

## 2021-07-20 NOTE — Progress Notes (Signed)
[facility] PROGRESS NOTE  Vincent [name] VFI:[id number] DOB: 11-11-1970 DOA: 04/29/2021  Patient goes by Vincent [name]   PCP: Pcp, No  Status: Remains inpatient appropriate because:Altered mental status and Unsafe d/c plan  Dispo: The patient is from: Home              Anticipated d/c is to: SNF-trach capable-[place] Monday 10/24              Patient currently is medically stable to d/c.  Difficult to place patient Yes               Barriers to discharge: Medicaid funding-availability of trach capable facilities  Level of care: Progressive   Code Status: DNR  HPI:  50 y.o. 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  Patient's sister was adamant about continuing full scope of treatment although patient does remain a DNR   Subjective: Sweaty this AM, improved after heat in room turned down (was on 85)  Objective: Vitals:   07/20/21 0742 07/20/21 1021  BP: 120/88   Pulse: (!) 110   Resp:    Temp: 99.7 F (37.6 C) 99.3 F (37.4 C)  SpO2: 95%     Intake/Output Summary (Last 24 hours) at 07/20/2021 1120 Last data filed at 07/20/2021 0600 Gross per 24 hour  Intake 3025 ml  Output --  Net 3025 ml   Filed Weights   07/17/21 0500 07/18/21 0500 07/19/21 0500  Weight: 64.6 kg 64.4 kg 64.4 kg    Exam:  General: Appearance:    Chronically ill appearing male with small amounts of dischargfe from b/l eyes                      Significant Events: 8/2 PEA Arrest, King airway in the field, CPR x6 minutes and epi x1, ROSC, Intubated in ED, Admitted to [facility]  8/24 PEG placed 8/4 Weaned off TTM, Co ox O2 sat of 47%. 8/8 Post pyloric Cortrak placed, myoclonic jerking, started Keppra, depakote 8/10 Completed antibiotic course for pneumonia. Family  meeting transitioned to DNR. 8/15 Tracheostomy  8/18 Febrile overnight, started Zosyn for aspiration pneumonia 8/22 Completed Abx course for aspiration pneumonia  8/24 PEG placed 8/31 PSV wean 2 hours 9/1 Stopped lasix, farxiga, losartan, spiro with AKI. Ongoing jerking, diaphoresis. WBC increased, cultured / abx started empirically.  PCT increased. CK 2,450 9/2 start scheduled opiates for possible opiate withdrawal symptoms 9/4 add flexeril, on PSV.  Versed x1 overnight. 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: Continue Keppra Patient was not on SMRs so Skelaxin initiated on 10/18 due to increased spasticity EEG without evidence of seizure activity  Fever secondary to ESBL Klebsiella tracheobronchitis (recurrent)/abnormal blood cultures with MRSE S/p  Last dose of meropenem 10/20  Blood culture likely contaminant  as follow-up blood cultures negative  Acute respiratory failure with hypoxia in the setting of cardiac arrest and aspiration pneumonia: Trach team following prn --due to PVS not a candidate for decannulation  Suspected anxiety/pain Continue Tylenol prn, Oxy IR 2.5 mg prn and scheduled Klonopin  Loose stools Discontinue any scheduled laxatives or stool softeners  Injected sclera bilaterally  Continue Zaditor eyedrops  Sepsis due to ESBL Klebsiella tracheobronchitis and ESBL Klebsiella UTI: Treated with meropenem; has had recurrence of tracheobronchitis-see above-sepsis physiology previously resolved   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  Hypovolemic hyponatremia: Resolved.    Acute combined systolic and diastolic heart failure with associated pulmonary  hypertension: Continue Lopressor and prn Lasix    Opiate withdrawals: Resolved.  Nutrition: Status post PEG tube placement continue tube feedings and free water flushes.  Left upper lung nodule: Will need a follow-up CT in 3 months.  Hyperlipidemia: Continue Lipitor.   Goals of care: PVS-guarded prognosis -palliative care was consulted sister adamant about continuing full scope of treatment.  Pressure Injury 07/19/21 Heel Left Unstageable - Full thickness tissue loss in which the base of the injury is covered by slough (yellow, tan, gray, green or brown) and/or eschar (tan, brown or black) in the wound bed. (Active)  Date First Assessed/Time First Assessed: 07/19/21 0700   Location: Heel  Location Orientation: Left  Staging: Unstageable - Full thickness tissue loss in which the base of the injury is covered by slough (yellow, tan, gray, green or brown) and/or esch...    No assessment data to display     No Linked orders to display       Data Reviewed: Basic Metabolic Panel: Recent Labs  Lab 07/14/21 0408 07/15/21 0314 07/16/21 0323 07/17/21 0308 07/18/21 0231  NA 131* 134* 135 132* 134*  K 4.0 4.2 4.6 4.3 4.4  CL 98 102 102 100 101  CO2 24 24 21* 22 24  GLUCOSE 94 127* 121* 116* 110*  BUN 17 15 17 17 16   CREATININE 0.58* 0.62 0.63 0.58* 0.57*  CALCIUM 9.7 9.8 9.7 9.9 10.1  MG  --  2.1 1.9 2.0 1.9   Liver Function Tests: Recent Labs  Lab 07/15/21 0314 07/16/21 0323 07/17/21 0308 07/18/21 0231  AST 31 41 53* 62*  ALT 86* 115* 160* 208*  ALKPHOS 56 59 63 62  BILITOT 0.7 0.4 0.5 0.5  PROT 7.1 7.1 7.5 7.4  ALBUMIN 3.5 3.7 3.7 3.8    CBC: Recent Labs  Lab 07/14/21 0408 07/15/21 0314 07/16/21 0323 07/17/21 0308 07/18/21 0231  WBC 11.2* 10.8* 11.3* 11.3* 11.5*  NEUTROABS 5.5 5.7 6.6 6.1 6.0  HGB 12.8* 13.3 13.6 13.5 14.1  HCT 39.7 41.1 43.8 41.1 43.0  MCV 89.4 88.8 93.6 88.0 87.9  PLT 425* 393 403* 461* 445*    BNP (last 3 results) Recent Labs     06/09/21 0350 07/15/21 0314 07/16/21 0323  BNP 406.5* 346.8* 437.5*    CBG: Recent Labs  Lab 07/19/21 1107 07/19/21 1529 07/19/21 1934 07/19/21 2308 07/20/21 0744  GLUCAP 133* 106* 134* 119* 141*       Scheduled Meds:  arformoterol  15 mcg Nebulization BID   atorvastatin  40 mg Per Tube QHS   budesonide (PULMICORT) nebulizer solution  0.5 mg Nebulization BID   chlorhexidine  15 mL Mouth Rinse BID   clonazePAM  0.5 mg Per Tube QHS   feeding supplement (PROSource TF)  45 mL Per Tube Daily   fiber  1 packet Per Tube BID   free water  125 mL Per Tube Q4H   heparin  5,000 Units Subcutaneous Q8H   ketotifen  1 drop Both Eyes BID   levETIRAcetam  1,000 mg Per Tube BID   lisinopril  2.5 mg Per Tube Daily   mouth rinse  15 mL Mouth Rinse q12n4p   metaxalone  400 mg Oral TID   metoprolol tartrate  100 mg Per Tube BID   pantoprazole sodium  40 mg Per Tube QHS   polyvinyl alcohol  2 drop Both Eyes Q6H   revefenacin  175 mcg Nebulization Daily   senna  1 tablet Per Tube BID   Continuous Infusions:  sodium chloride 250 mL (06/15/21 1719)   feeding supplement (JEVITY 1.2 CAL) 1,000 mL (07/19/21 2200)    Active Problems:   Cardiac arrest (HCC)   Acute respiratory failure with hypoxia (HCC)   Seizure-like activity (HCC)   Anoxic encephalopathy (HCC)   Tracheostomy in place [facility])   Goals of care, counseling/discussion   AKI (acute kidney injury) (HCC)   Tracheostomy dependence (HCC)   Persistent vegetative state (HCC)   Generalized anxiety disorder   Other chronic pain   Ogilvie syndrome   Gram-negative infection   Acute UTI   Urinary tract infection due to ESBL Klebsiella   Consultants: Neurology General surgery Interventional radiology PCCM  Procedures: 2D echocardiogram Multiple EEGs Cortrak placement PEG tube placement  Antibiotics: Azithromycin 8/2 through 8/3 Cefepime 8/2 through 8/3 Ceftriaxone 8/3 through 8/7 Vancomycin 8/2 x 1 dose Zosyn 8/18 x  1 Unasyn 8/19 through 8/21 Cefazolin 8/24 through 8/24 Fluconazole 8/25 through 9/3 Zosyn 8/25 x 1 dose Cefepime 9/1 x 1 dose Vancomycin 9/1 x 1 dose Azithromycin 9/8 through 9/12 Zosyn 9/12 x 1 dose Meropenem 9/13 through 9/20 Unasyn 10/14 x 1 dose Zosyn 10/15 x 1 dose Vancomycin 10/15 through 10/16 Meropenem 10/16 >> 10/20   Time spent: 15 minutes    Joseph Art DO [facility] Please refer to Amion for contact info 82  days

## 2021-07-21 LAB — GLUCOSE, CAPILLARY
Glucose-Capillary: 132 mg/dL — ABNORMAL HIGH (ref 70–99)
Glucose-Capillary: 152 mg/dL — ABNORMAL HIGH (ref 70–99)
Glucose-Capillary: 106 mg/dL — ABNORMAL HIGH (ref 70–99)
Glucose-Capillary: 123 mg/dL — ABNORMAL HIGH (ref 70–99)

## 2021-07-21 LAB — LEVETIRACETAM LEVEL: Levetiracetam Lvl: 40.9 ug/mL — ABNORMAL HIGH (ref 10.0–40.0)

## 2021-07-21 MED ORDER — NUTRISOURCE FIBER PO PACK: 1.0000 | PACK | Freq: Two times a day (BID) | ORAL | Status: AC

## 2021-07-21 MED ORDER — BUDESONIDE 0.5 MG/2ML IN SUSP: 0.5000 mg | Freq: Two times a day (BID) | RESPIRATORY_TRACT | 12 refills | Status: AC

## 2021-07-21 MED ORDER — METAXALONE 400 MG PO TABS: 400.0000 mg | ORAL_TABLET | Freq: Three times a day (TID) | ORAL | 0 refills | Status: AC

## 2021-07-21 MED ORDER — METOPROLOL TARTRATE 100 MG PO TABS: 100.0000 mg | ORAL_TABLET | Freq: Two times a day (BID) | ORAL | Status: AC

## 2021-07-21 MED ORDER — PANTOPRAZOLE SODIUM 40 MG PO PACK: 40.0000 mg | PACK | Freq: Every day | ORAL | Status: AC

## 2021-07-21 MED ORDER — ATORVASTATIN CALCIUM 40 MG PO TABS: 40.0000 mg | ORAL_TABLET | Freq: Every day | ORAL | Status: AC

## 2021-07-21 MED ORDER — SENNA 8.6 MG PO TABS: 1.0000 | ORAL_TABLET | Freq: Two times a day (BID) | ORAL | 0 refills | Status: AC

## 2021-07-21 MED ORDER — REVEFENACIN 175 MCG/3ML IN SOLN: 175.0000 ug | Freq: Every day | RESPIRATORY_TRACT | Status: AC

## 2021-07-21 MED ORDER — LISINOPRIL 2.5 MG PO TABS: 2.5000 mg | ORAL_TABLET | Freq: Every day | ORAL | Status: AC

## 2021-07-21 MED ORDER — ARFORMOTEROL TARTRATE 15 MCG/2ML IN NEBU: 15.0000 ug | INHALATION_SOLUTION | Freq: Two times a day (BID) | RESPIRATORY_TRACT | Status: AC

## 2021-07-21 MED ORDER — POLYVINYL ALCOHOL 1.4 % OP SOLN: 2.0000 [drp] | Freq: Four times a day (QID) | OPHTHALMIC | 0 refills | Status: AC

## 2021-07-21 MED ORDER — LEVETIRACETAM 100 MG/ML PO SOLN: 1000.0000 mg | Freq: Two times a day (BID) | ORAL | 12 refills | Status: AC

## 2021-07-21 MED ORDER — POLYETHYLENE GLYCOL 3350 17 G PO PACK: 17.0000 g | PACK | Freq: Every day | ORAL | 0 refills | Status: AC | PRN

## 2021-07-21 MED ORDER — CLONAZEPAM 0.5 MG PO TABS: 0.5000 mg | ORAL_TABLET | Freq: Every day | ORAL | 0 refills | Status: AC

## 2021-07-21 MED ORDER — DOCUSATE SODIUM 50 MG/5ML PO LIQD: 200.0000 mg | Freq: Two times a day (BID) | ORAL | 0 refills | Status: AC | PRN

## 2021-07-21 MED ORDER — ACETAMINOPHEN 160 MG/5ML PO SOLN: 650.0000 mg | Freq: Four times a day (QID) | ORAL | 0 refills | Status: AC | PRN

## 2021-07-21 MED ORDER — CYCLOBENZAPRINE HCL 5 MG PO TABS: 5.0000 mg | ORAL_TABLET | Freq: Once | ORAL | 0 refills | Status: AC | PRN

## 2021-07-21 MED ORDER — PROSOURCE TF PO LIQD: 45.0000 mL | Freq: Every day | ORAL | Status: AC

## 2021-07-21 MED ORDER — ALBUTEROL SULFATE (2.5 MG/3ML) 0.083% IN NEBU: 2.5000 mg | INHALATION_SOLUTION | RESPIRATORY_TRACT | 12 refills | Status: AC | PRN

## 2021-07-21 MED ORDER — JEVITY 1.2 CAL PO LIQD: 1000.0000 mL | ORAL | 0 refills | Status: AC

## 2021-07-21 MED ORDER — FREE WATER: 125.0000 mL | Status: AC

## 2021-07-21 MED ORDER — METAXALONE 800 MG PO TABS
400.0000 mg | ORAL_TABLET | Freq: Three times a day (TID) | ORAL | Status: DC
  Administered 2021-07-21 – 2021-07-22 (×3): 400 mg
  Filled 2021-07-21 (×5): qty 0.5

## 2021-07-21 NOTE — Progress Notes (Addendum)
1:45pm: CSW spoke with Tammy, RN to request she obtain additional trach supplies for patient to prepare for his discharge tomorrow - RN agreeable.  10:50am: CSW received confirmation that patient's insurance authorization was approved #[id number].  Patient will transfer to [facility] in [place] tomorrow morning. CSW has scheduled pickup for 12:30pm tomorrow - patient will transport via [facility].   CSW updated patient's sister on discharge plan and provided her with contact information for facility.  8:40am: CSW spoke with Jon Gills at [employer] [employer] who states the insurance authorization is still pending at this time - no additional information needed.  Edwin Dada, MSW, LCSW Transitions of Care  Clinical Social Worker II [phone]

## 2021-07-21 NOTE — Discharge Summary (Signed)
Physician Discharge Summary  Vincent [name] WVP:[id number] DOB: [birth year] DOA: 04/29/2021  PCP: Pcp, No  Admit date: 04/29/2021 Discharge date: 07/22/2021     Time spent: 35 minutes  Recommendations for Outpatient Follow-up:  Patient will discharge to [facility] in [place] Incidental finding of left upper lobe nodule with radiologist recommending follow-up imaging December 2022    Discharge Diagnoses:  Active Problems:   Cardiac arrest (HCC)   Acute respiratory failure with hypoxia (HCC)   Seizure-like activity (HCC)   Anoxic encephalopathy (HCC)   Tracheostomy in place [facility])   Goals of care, counseling/discussion   AKI (acute kidney injury) (HCC)   Tracheostomy dependence (HCC)   Persistent vegetative state (HCC)   Generalized anxiety disorder   Other chronic pain   Ogilvie syndrome   Gram-negative infection   Acute UTI   Urinary tract infection due to ESBL Klebsiella    Discharge Condition: Stable  Diet recommendation: Jevity 1.2 at 75 cc/h with free water 125 cc per tube every 4 hours  Filed Weights   07/18/21 0500 07/19/21 0500 07/21/21 0500  Weight: 64.4 kg 64.4 kg 63.7 kg    History of present illness:  50 y.o. 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   He has had repetitive blinking concerning for seizure activity but EEG has been negative.  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  Since admission he has been treated for sepsis secondary to Blood culture showed Klebsiella and MSSA, he did complete a course of antibiotics.  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 Goals of care  discussion held by palliative care team.  Patient's sister was adamant about continuing full scope of treatment although patient does remain a DNR  Hospital Course:  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: Continue Keppra Flexeril and Skelaxin 2/2 spasticity EEG without evidence of seizure activity   Fever secondary to ESBL Klebsiella tracheobronchitis (recurrent)/abnormal blood cultures with MRSE Has completed course of meropenem.  Blood cultures consistent with contaminant  Acute respiratory failure with hypoxia in the setting of cardiac arrest and aspiration pneumonia: Due to PVS not a candidate for decannulation Clinically stable on FiO2 21% (RA)   Suspected anxiety/pain Continue Tylenol prn and scheduled Klonopin   Loose stools Discontinued scheduled laxatives/stool softeners  Sepsis due to ESBL Klebsiella tracheobronchitis and ESBL Klebsiella UTI: Sepsis physiology resolved.  Completed meropenem   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  Hypovolemic hyponatremia: Resolved.    Acute combined systolic and diastolic heart failure with associated pulmonary hypertension: Continue Lopressor and Zestril  Nutrition: Status post PEG tube placement -continue tube feedings and free water flushes.  Left upper lung nodule: Will need a follow-up CT in 3 months.  Hyperlipidemia: Continue Lipitor.   Goals of care: PVS-guarded prognosis -palliative care was consulted sister adamant about continuing full scope of treatment.  Unstageable left heel wound Pressure Injury 07/19/21 Heel Left Unstageable - Full thickness tissue loss in which the base of the injury is covered by slough (yellow, tan, gray, green or brown) and/or eschar (tan, brown or black) in the wound bed. (Active)  Date First Assessed/Time First Assessed: 07/19/21 0700   Location: Heel   Location Orientation: Left  Staging: Unstageable - Full thickness tissue loss in which the base of the injury is covered by slough (yellow, tan, gray, green or brown) and/or esch...    Assessments 07/20/2021  8:12 AM 07/22/2021  2:36 AM  Dressing Type Other (Comment) Foam - Lift dressing to assess site every shift  Dressing -- Changed  Site / Wound Assessment Clean;Dry Clean;Dry  Peri-wound Assessment Black --  Drainage Amount None None  Treatment Cleansed Cleansed     No Linked orders to display     Procedures: 2D echocardiogram Multiple EEGs Cortrak placement PEG tube placement  Consultations: Neurology General surgery Interventional radiology PCCM  Antibiotics: Azithromycin 8/2 through 8/3 Cefepime 8/2 through 8/3 Ceftriaxone 8/3 through 8/7 Vancomycin 8/2 x 1 dose Zosyn 8/18 x 1 Unasyn 8/19 through 8/21 Cefazolin 8/24 through 8/24 Fluconazole 8/25 through 9/3 Zosyn 8/25 x 1 dose Cefepime 9/1 x 1 dose Vancomycin 9/1 x 1 dose Azithromycin 9/8 through 9/12 Zosyn 9/12 x 1 dose Meropenem 9/13 through 9/20 Unasyn 10/14 x 1 dose Zosyn 10/15 x 1 dose Vancomycin 10/15 through 10/16 Meropenem 10/16 >> 10/20  Discharge Exam: Vitals:   07/22/21 0800 07/22/21 0806  BP: (!) 133/92 (!) 133/92  Pulse: (!) 101 (!) 110  Resp: 20 (!) 24  Temp: 98 F (36.7 C)   SpO2: 97% 100%   Constitutional: Awake, eyes open, looked at me when spoken to Respiratory: #6.0 cuffed Shiley trach, room air via TC.  Lung sounds are CTA bilaterally, respiratory effort normal, pulse oximetry 98 to 100%. Cardiovascular: S1-S2, normotensive, stable intermittent tachycardia, appropriate capillary refill Abdomen: Nontender soft, normoactive bowel sounds, G-tube site unremarkable, LBM 10/22 Neurologic: CN 2-12 grossly intact-awake,   Tracking appropriately Psychiatric: Awake, nonverbal, unable to accurately assess  Discharge Instructions   Discharge Instructions     Diet general   Complete by:  As directed    Tube feeding   Discharge wound care:   Complete by: As directed    Apply foam dressing to left heel and change at least weekly.  Lift foam dressing periodically to assess wound.   Increase activity slowly   Complete by: As directed    Recommend PROM at least once per shift.  Recommend out of bed to chair with [facility] lift.      Allergies as of 07/22/2021   No Known Allergies      Medication List     STOP taking these medications    amLODipine 5 MG tablet Commonly known as: NORVASC       TAKE these medications    acetaminophen 160 MG/5ML solution Commonly known as: TYLENOL Place 20.3 mLs (650 mg total) into feeding tube every 6 (six) hours as needed for mild pain or fever.   albuterol (2.5 MG/3ML) 0.083% nebulizer solution Commonly known as: PROVENTIL Take 3 mLs (2.5 mg total) by nebulization every 4 (four) hours as needed for shortness of breath or wheezing.   arformoterol 15 MCG/2ML Nebu Commonly known as: BROVANA Take 2 mLs (15 mcg total) by nebulization 2 (two) times daily.   atorvastatin 40 MG tablet Commonly known as: LIPITOR Place 1 tablet (40 mg total) into feeding tube at bedtime. What changed: how to take this   budesonide 0.5 MG/2ML nebulizer solution Commonly known as: PULMICORT Take 2 mLs (0.5 mg total) by nebulization 2 (two) times daily.   clonazePAM 0.5 MG tablet Commonly known as: KLONOPIN Place 1  tablet (0.5 mg total) into feeding tube at bedtime.   cyclobenzaprine 5 MG tablet Commonly known as: FLEXERIL Place 1 tablet (5 mg total) into feeding tube once as needed for muscle spasms.   docusate 50 MG/5ML liquid Commonly known as: COLACE Place 20 mLs (200 mg total) into feeding tube 2 (two) times daily as needed for mild constipation.   feeding supplement (PROSource TF) liquid Place 45 mLs into feeding tube daily.   feeding supplement (JEVITY 1.2 CAL) Liqd Place 1,000 mLs into feeding tube continuous.   fiber Pack  packet Place 1 packet into feeding tube 2 (two) times daily.   free water Soln Place 125 mLs into feeding tube every 4 (four) hours.   levETIRAcetam 100 MG/ML solution Commonly known as: KEPPRA Place 10 mLs (1,000 mg total) into feeding tube 2 (two) times daily.   lisinopril 2.5 MG tablet Commonly known as: ZESTRIL Place 1 tablet (2.5 mg total) into feeding tube daily.   metaxalone 400 MG tablet Commonly known as: SKELAXIN Take 1 tablet (400 mg total) by mouth 3 (three) times daily.   metoprolol tartrate 100 MG tablet Commonly known as: LOPRESSOR Place 1 tablet (100 mg total) into feeding tube 2 (two) times daily.   pantoprazole sodium 40 mg Commonly known as: PROTONIX Place 40 mg into feeding tube at bedtime.   polyethylene glycol 17 g packet Commonly known as: MIRALAX / GLYCOLAX Place 17 g into feeding tube daily as needed for moderate constipation.   polyvinyl alcohol 1.4 % ophthalmic solution Commonly known as: LIQUIFILM TEARS Place 2 drops into both eyes every 6 (six) hours.   revefenacin 175 MCG/3ML nebulizer solution Commonly known as: YUPELRI Take 3 mLs (175 mcg total) by nebulization daily.   senna 8.6 MG Tabs tablet Commonly known as: SENOKOT Place 1 tablet (8.6 mg total) into feeding tube 2 (two) times daily.               Discharge Care Instructions  (From admission, onward)           Start     Ordered   07/22/21 0000  Discharge wound care:       Comments: Apply foam dressing to left heel and change at least weekly.  Lift foam dressing periodically to assess wound.   07/22/21 0946           No Known Allergies    The results of significant diagnostics from this hospitalization (including imaging, microbiology, ancillary and laboratory) are listed below for reference.    Significant Diagnostic Studies: DG CHEST PORT 1 VIEW  Result Date: 07/18/2021 CLINICAL DATA:  Tachypnea.  Tracheostomy. EXAM: PORTABLE CHEST 1 VIEW COMPARISON:   Radiographs 07/14/2021 and 07/11/2021. FINDINGS: 1219 hours. Tracheostomy appears well positioned. The heart size and mediastinal contours are stable. Stable mild diffuse interstitial prominence without focal airspace disease, edema, pleural effusion or pneumothorax. The bones appear unchanged. Telemetry leads overlie the chest. IMPRESSION: Stable position of the tracheostomy and mild diffuse interstitial prominence. No acute findings. Electronically Signed   By: Carey Bullocks M.D.   On: 07/18/2021 14:40   DG Chest Port 1 View  Result Date: 07/14/2021 CLINICAL DATA:  Shortness of breath EXAM: PORTABLE CHEST 1 VIEW COMPARISON:  Earlier today FINDINGS: Normal heart size and mediastinal contours. Tracheostomy tube in place. Mild retrocardiac streaky opacity. No edema, effusion, or pneumothorax. IMPRESSION: Retrocardiac atelectasis. Electronically Signed   By: Tiburcio Pea M.D.   On: 07/14/2021 09:49   DG Chest [facility]  Result Date: 07/14/2021 CLINICAL DATA:  SOB. Hx of CPR. No other hx of heart or lung problems. Pt is a current smoker. EXAM: PORTABLE CHEST - 1 VIEW COMPARISON:  07/11/2021 FINDINGS: Tracheostomy stable in position. Mildly prominent interstitial markings left greater than right, stable since films dating back to 07/06/2021. No confluent airspace disease. Heart size stable. No effusion. Visualized bones unremarkable. Gastrostomy catheter partially visualized. IMPRESSION: Stable appearance since prior exam Electronically Signed   By: Corlis Leak M.D.   On: 07/14/2021 07:41   DG Chest Port 1 View  Result Date: 07/11/2021 CLINICAL DATA:  Respiratory failure. EXAM: PORTABLE CHEST 1 VIEW COMPARISON:  July 06, 2021. FINDINGS: Stable cardiomediastinal silhouette. Tracheostomy is in grossly good position. No pneumothorax or pleural effusion is noted. Lungs are clear. Bony thorax is unremarkable. IMPRESSION: No active disease. Electronically Signed   By: Lupita Raider M.D.   On:  07/11/2021 10:53   DG Chest Port 1 View  Result Date: 07/06/2021 CLINICAL DATA:  Shortness of breath. EXAM: PORTABLE CHEST 1 VIEW COMPARISON:  June 26, 2021 FINDINGS: The heart size and mediastinal contours are stable. Tracheostomy tube is unchanged. Both lungs are clear. The visualized skeletal structures are unremarkable. IMPRESSION: No active cardiopulmonary disease. Electronically Signed   By: Sherian Rein M.D.   On: 07/06/2021 09:02   DG CHEST PORT 1 VIEW  Result Date: 06/26/2021 CLINICAL DATA:  Shortness of breath, abdominal distention EXAM: PORTABLE CHEST 1 VIEW COMPARISON:  06/11/2021 FINDINGS: Tracheostomy remains in place, unchanged. Heart is normal size. No confluent airspace opacities or effusions. No acute bony abnormality. IMPRESSION: No acute cardiopulmonary disease. Electronically Signed   By: Charlett Nose M.D.   On: 06/26/2021 10:18   DG Abd Portable 1V  Result Date: 07/07/2021 CLINICAL DATA:  Nausea. EXAM: PORTABLE ABDOMEN - 1 VIEW COMPARISON:  06/26/2021 FINDINGS: A gastrostomy tube projects over the left upper quadrant. Gas and stool are present in nondilated colon. No dilated loops of bowel are seen to suggest obstruction. No acute osseous abnormality is seen. IMPRESSION: Negative. Electronically Signed   By: Sebastian Ache M.D.   On: 07/07/2021 09:34   DG Abd Portable 1V  Result Date: 06/26/2021 CLINICAL DATA:  Abdominal distension. EXAM: PORTABLE ABDOMEN - 1 VIEW COMPARISON:  Abdominal x-ray dated June 16, 2021. FINDINGS: Unchanged gastrostomy tube. The bowel gas pattern is normal. No radio-opaque calculi or other significant radiographic abnormality are seen. IMPRESSION: 1. Negative. Electronically Signed   By: Obie Dredge M.D.   On: 06/26/2021 09:53   EEG adult  Result Date: 07/15/2021 Charlsie Quest, MD     07/15/2021 11:08 AM Patient Name: Hashir Deleeuw MRN: [id number] Epilepsy Attending: Charlsie Quest Referring Physician/Provider: Junious Silk, NP Date: 07/15/2021 Duration: 23.58mins  Patient history: 50 year old male status post cardiac arrest, now with new spasticity. EEG to evaluate for seizure  Level of alertness:  awake  AEDs during EEG study: LEV, Clonazepam  Technical aspects: This EEG study was done with scalp electrodes positioned according to the 10-20 International system of electrode placement. Electrical activity was acquired at a sampling rate of 500Hz  and reviewed with a high frequency filter of 70Hz  and a low frequency filter of 1Hz . EEG data were recorded continuously and digitally stored.  Description: No clear posterior dominant rhythm was seen.  EEG showed continuous generalized 3 to 5 Hz theta-delta slowing.  Throughout the EEG, intermittently patient was noted to moan with generalized subtle stiffening.  Concomitant EEG before, during and after  the event did not show any EEG to suggest seizure.  Hyperventilation and photic stimulation were not performed.    ABNORMALITY -Continuous slow, generalized  IMPRESSION: This study is suggestive of moderate diffuse encephalopathy, nonspecific to etiology.  No seizures or epileptiform discharges were seen during this study. Throughout the EEG, intermittently patient was noted to morning with generalized subtle stiffening without concomitant EEG change.  These episodes were NON-EPILEPTIC.   Charlsie Quest    Microbiology: Recent Results (from the past 240 hour(s))  Culture, blood (routine x 2)     Status: None   Collection Time: 07/14/21  7:46 AM   Specimen: BLOOD RIGHT HAND  Result Value Ref Range Status   Specimen Description BLOOD RIGHT HAND  Final   Special Requests   Final    BOTTLES DRAWN AEROBIC AND ANAEROBIC Blood Culture adequate volume   Culture   Final    NO GROWTH 5 DAYS Performed at [facility], [address], [place], [place] [address]    Report Status 07/19/2021 FINAL  Final  Culture, blood (routine x 2)     Status: None   Collection Time: 07/14/21   8:00 AM   Specimen: BLOOD LEFT HAND  Result Value Ref Range Status   Specimen Description BLOOD LEFT HAND  Final   Special Requests   Final    BOTTLES DRAWN AEROBIC AND ANAEROBIC Blood Culture adequate volume   Culture   Final    NO GROWTH 5 DAYS Performed at [facility], [address] [place], [place] [address]    Report Status 07/19/2021 FINAL  Final  Resp Panel by RT-PCR (Flu A&B, Covid) Nasopharyngeal Swab     Status: None   Collection Time: 07/20/21 11:11 AM   Specimen: Nasopharyngeal Swab; Nasopharyngeal(NP) swabs in vial transport medium  Result Value Ref Range Status   SARS Coronavirus 2 by RT PCR NEGATIVE NEGATIVE Final    Comment: (NOTE) SARS-CoV-2 target nucleic acids are NOT DETECTED.  The SARS-CoV-2 RNA is generally detectable in upper respiratory specimens during the acute phase of infection. The lowest concentration of SARS-CoV-2 viral copies this assay can detect is 138 copies/mL. A negative result does not preclude SARS-Cov-2 infection and should not be used as the sole basis for treatment or other patient management decisions. A negative result may occur with  improper specimen collection/handling, submission of specimen other than nasopharyngeal swab, presence of viral mutation(s) within the areas targeted by this assay, and inadequate number of viral copies(<138 copies/mL). A negative result must be combined with clinical observations, patient history, and epidemiological information. The expected result is Negative.  Fact Sheet for Patients:  BloggerCourse.com  Fact Sheet for Healthcare Providers:  SeriousBroker.it  This test is no t yet approved or cleared by the [place] FDA and  has been authorized for detection and/or diagnosis of SARS-CoV-2 by FDA under an Emergency Use Authorization (EUA). This EUA will remain  in effect (meaning this test can be used) for the duration of the COVID-19  declaration under Section 564(b)(1) of the Act, 21 U.S.C.section 360bbb-3(b)(1), unless the authorization is terminated  or revoked sooner.       Influenza A by PCR NEGATIVE NEGATIVE Final   Influenza B by PCR NEGATIVE NEGATIVE Final    Comment: (NOTE) The Xpert Xpress SARS-CoV-2/FLU/RSV plus assay is intended as an aid in the diagnosis of influenza from Nasopharyngeal swab specimens and should not be used as a sole basis for treatment. Nasal washings and aspirates  are unacceptable for Xpert Xpress SARS-CoV-2/FLU/RSV testing.  Fact Sheet for Patients: BloggerCourse.com  Fact Sheet for Healthcare Providers: SeriousBroker.it  This test is not yet approved or cleared by the [place] FDA and has been authorized for detection and/or diagnosis of SARS-CoV-2 by FDA under an Emergency Use Authorization (EUA). This EUA will remain in effect (meaning this test can be used) for the duration of the COVID-19 declaration under Section 564(b)(1) of the Act, 21 U.S.C. section 360bbb-3(b)(1), unless the authorization is terminated or revoked.  Performed at [facility], [address] [place], [place] [address]      Labs: Basic Metabolic Panel: Recent Labs  Lab 07/16/21 0323 07/17/21 0308 07/18/21 0231  NA 135 132* 134*  K 4.6 4.3 4.4  CL 102 100 101  CO2 21* 22 24  GLUCOSE 121* 116* 110*  BUN 17 17 16   CREATININE 0.63 0.58* 0.57*  CALCIUM 9.7 9.9 10.1  MG 1.9 2.0 1.9   Liver Function Tests: Recent Labs  Lab 07/16/21 0323 07/17/21 0308 07/18/21 0231  AST 41 53* 62*  ALT 115* 160* 208*  ALKPHOS 59 63 62  BILITOT 0.4 0.5 0.5  PROT 7.1 7.5 7.4  ALBUMIN 3.7 3.7 3.8   No results for input(s): LIPASE, AMYLASE in the last 168 hours. No results for input(s): AMMONIA in the last 168 hours. CBC: Recent Labs  Lab 07/16/21 0323 07/17/21 0308 07/18/21 0231  WBC 11.3* 11.3* 11.5*  NEUTROABS 6.6 6.1 6.0  HGB 13.6  13.5 14.1  HCT 43.8 41.1 43.0  MCV 93.6 88.0 87.9  PLT 403* 461* 445*   Cardiac Enzymes: No results for input(s): CKTOTAL, CKMB, CKMBINDEX, TROPONINI in the last 168 hours. BNP: BNP (last 3 results) Recent Labs    06/09/21 0350 07/15/21 0314 07/16/21 0323  BNP 406.5* 346.8* 437.5*    ProBNP (last 3 results) No results for input(s): PROBNP in the last 8760 hours.  CBG: Recent Labs  Lab 07/21/21 1608 07/21/21 2058 07/22/21 0015 07/22/21 0422 07/22/21 0733  GLUCAP 106* 123* 126* 131* 133*       Signed:  07/24/21 ANP [facility] 07/22/2021, 9:46 AM

## 2021-07-21 NOTE — Progress Notes (Signed)
[facility] PROGRESS NOTE  Vincent [name] TIW:[id number] DOB: 1971-09-23 DOA: 04/29/2021  Patient goes by Vincent [name]   PCP: Pcp, No  Status: Remains inpatient appropriate because:Altered mental status and Unsafe d/c plan  Dispo: The patient is from: Home              Anticipated d/c is to: SNF-trach capable-[place] Monday 10/24              Patient currently is medically stable to d/c.  Difficult to place patient Yes               Barriers to discharge: Medicaid funding-availability of trach capable facilities  Level of care: Progressive   Code Status: DNR Family Communication: SW updated Sister [name] about plan discharge early next week DVT prophylaxis: Subcutaneous heparin COVID vaccination status: Unknown  HPI:  50 y.o. 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  He has had repetitive blinking concerning for seizure activity but EEG has been negative.  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  Since admission he has been treated for sepsis secondary to Blood culture showed Klebsiella and MSSA, he did complete a course of antibiotics.  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 Goals of care discussion held by palliative care team.  Patient's sister was adamant about continuing full scope of treatment although patient does remain a DNR   Subjective: Alert and awake.  Was facing away from me and when I spoke his name he turned towards me.  Restless and appears uncomfortable.  Objective: Vitals:   07/21/21 0808 07/21/21 1121  BP:    Pulse: (!) 108 95  Resp: (!) 22 (!) 24  Temp:    SpO2: 98% 98%     Intake/Output Summary (Last 24 hours) at 07/21/2021 1155 Last data filed at 07/21/2021 0406 Gross per 24 hour  Intake 1416.25 ml  Output 200 ml  Net 1216.25 ml   Filed Weights   07/18/21 0500 07/19/21 0500 07/21/21 0500  Weight: 64.4 kg 64.4 kg 63.7 kg    Exam:  Constitutional: Awake, eyes open, current towards me when I spoke his name. Respiratory: #6.0 cuffed Shiley trach, stable on room air via TC.  Lung sounds are CTA bilaterally, current respiratory effort is normal, pulse oximetry 98 to 100%. Cardiovascular: S1-S2, normotensive, stable intermittent tachycardia, appropriate capillary refill Abdomen: Nontender soft, normoactive bowel sounds, G-tube site unremarkable, LBM 10/22-reports patient has been having frequent loose stools in context of scheduled laxative Neurologic: CN 2-12 grossly intact-awake,   Tracking appropriately Psychiatric: Awake, nonverbal, unable to accurately assess  Significant Events: 8/2 PEA Arrest, [place] airway in the field, CPR x6 minutes and epi x1, ROSC, Intubated in ED, Admitted to [facility]  8/24 PEG placed 8/4 Weaned off TTM, Co ox O2 sat of 47%. 8/8 Post pyloric Cortrak placed, myoclonic jerking, started Keppra, depakote 8/10 Completed antibiotic course for pneumonia. Family meeting transitioned to DNR. 8/15 Tracheostomy  8/18 Febrile overnight, started Zosyn for aspiration pneumonia 8/22 Completed Abx course for aspiration pneumonia  8/24 PEG placed 8/31 PSV wean 2 hours 9/1 Stopped lasix, farxiga, losartan, spiro with AKI. Ongoing jerking, diaphoresis. WBC increased, cultured / abx started empirically.  PCT increased. CK 2,450 9/2 start scheduled opiates for possible opiate withdrawal symptoms 9/4 add flexeril, on PSV.  Versed x1 overnight. 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: Continue Keppra; continue Skelaxin and Flexeril for spasticity  Fever secondary to ESBL Klebsiella tracheobronchitis (recurrent)/abnormal blood cultures with MRSE Has completed meropenem Blood culture likely contaminant --follow-up blood cultures negative  Acute respiratory failure with hypoxia in the setting of cardiac arrest and aspiration pneumonia: Trach team following prn --due to PVS not a candidate for decannulation  Suspected anxiety/pain Continue Tylenol prn and scheduled Klonopin  Loose stools Discontinue any scheduled laxatives or stool softeners  Injected sclera bilaterally Continue Zaditor eyedrops  Sepsis due to ESBL Klebsiella tracheobronchitis and ESBL Klebsiella UTI: Treated with meropenem; has had recurrence of tracheobronchitis-see above-sepsis physiology previously resolved   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  Hypovolemic hyponatremia: Resolved.    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  Nutrition: Status post PEG tube placement continue tube feedings and free water flushes.  Left upper lung nodule: Will need a follow-up CT in 3 months.  Hyperlipidemia: Continue Lipitor.   Goals of care: PVS-guarded prognosis -palliative care was consulted sister adamant about continuing full scope of treatment. 10/21 Physician [facility] advisor requesting FU palliative consult   Pressure Injury 07/19/21 Heel Left Unstageable - Full thickness tissue loss in which the base of the injury is covered by slough (yellow, tan, gray, green or brown) and/or eschar (tan, brown or black) in the wound bed. (Active)  Date First Assessed/Time First Assessed: 07/19/21 0700   Location: Heel  Location Orientation: Left  Staging:  Unstageable - Full thickness tissue loss in which the base of the injury is covered by slough (yellow, tan, gray, green or brown) and/or esch...    Assessments 07/20/2021  8:12 AM 07/20/2021  8:45 PM  Dressing Type Other (Comment) --  Site / Wound Assessment Clean;Dry Clean;Dry  Peri-wound Assessment Black Black;Erythema (non-blanchable)  Drainage Amount None None  Treatment Cleansed Cleansed     No Linked orders to display       Data Reviewed: Basic Metabolic Panel: Recent Labs  Lab 07/15/21 0314 07/16/21 0323 07/17/21 0308 07/18/21 0231  NA 134* 135 132* 134*  K 4.2 4.6 4.3 4.4  CL 102 102 100 101  CO2 24 21* 22 24  GLUCOSE 127* 121* 116* 110*  BUN 15 17 17 16   CREATININE 0.62 0.63 0.58* 0.57*  CALCIUM 9.8 9.7 9.9 10.1  MG 2.1 1.9 2.0 1.9   Liver Function Tests: Recent Labs  Lab 07/15/21 0314 07/16/21 0323 07/17/21 0308 07/18/21 0231  AST 31 41 53* 62*  ALT 86* 115* 160* 208*  ALKPHOS 56 59 63 62  BILITOT 0.7 0.4 0.5 0.5  PROT 7.1 7.1 7.5 7.4  ALBUMIN 3.5 3.7 3.7 3.8    CBC: Recent Labs  Lab 07/15/21 0314 07/16/21 0323 07/17/21 0308 07/18/21 0231  WBC 10.8* 11.3* 11.3* 11.5*  NEUTROABS 5.7 6.6 6.1 6.0  HGB 13.3 13.6 13.5 14.1  HCT 41.1 43.8 41.1 43.0  MCV 88.8 93.6 88.0 87.9  PLT 393 403* 461* 445*    BNP (last 3 results) Recent Labs    06/09/21 0350 07/15/21 0314 07/16/21  0323  BNP 406.5* 346.8* 437.5*    CBG: Recent Labs  Lab 07/20/21 1622 07/20/21 2046 07/20/21 2327 07/21/21 0800 07/21/21 1151  GLUCAP 135* 123* 103* 132* 152*       Scheduled Meds:  arformoterol  15 mcg Nebulization BID   atorvastatin  40 mg Per Tube QHS   budesonide (PULMICORT) nebulizer solution  0.5 mg Nebulization BID   chlorhexidine  15 mL Mouth Rinse BID   clonazePAM  0.5 mg Per Tube QHS   feeding supplement (PROSource TF)  45 mL Per Tube Daily   fiber  1 packet Per Tube BID   free water  125 mL Per Tube Q4H   heparin  5,000 Units Subcutaneous Q8H    ketotifen  1 drop Both Eyes BID   levETIRAcetam  1,000 mg Per Tube BID   lisinopril  2.5 mg Per Tube Daily   mouth rinse  15 mL Mouth Rinse q12n4p   metaxalone  400 mg Per Tube TID   metoprolol tartrate  100 mg Per Tube BID   pantoprazole sodium  40 mg Per Tube QHS   polyvinyl alcohol  2 drop Both Eyes Q6H   revefenacin  175 mcg Nebulization Daily   senna  1 tablet Per Tube BID   Continuous Infusions:  sodium chloride 250 mL (06/15/21 1719)   feeding supplement (JEVITY 1.2 CAL) 1,000 mL (07/21/21 0320)    Active Problems:   Cardiac arrest (HCC)   Acute respiratory failure with hypoxia (HCC)   Seizure-like activity (HCC)   Anoxic encephalopathy (HCC)   Tracheostomy in place [facility])   Goals of care, counseling/discussion   AKI (acute kidney injury) (HCC)   Tracheostomy dependence (HCC)   Persistent vegetative state (HCC)   Generalized anxiety disorder   Other chronic pain   Ogilvie syndrome   Gram-negative infection   Acute UTI   Urinary tract infection due to ESBL Klebsiella   Consultants: Neurology General surgery Interventional radiology PCCM  Procedures: 2D echocardiogram Multiple EEGs Cortrak placement PEG tube placement  Antibiotics: Azithromycin 8/2 through 8/3 Cefepime 8/2 through 8/3 Ceftriaxone 8/3 through 8/7 Vancomycin 8/2 x 1 dose Zosyn 8/18 x 1 Unasyn 8/19 through 8/21 Cefazolin 8/24 through 8/24 Fluconazole 8/25 through 9/3 Zosyn 8/25 x 1 dose Cefepime 9/1 x 1 dose Vancomycin 9/1 x 1 dose Azithromycin 9/8 through 9/12 Zosyn 9/12 x 1 dose Meropenem 9/13 through 9/20 Unasyn 10/14 x 1 dose Zosyn 10/15 x 1 dose Vancomycin 10/15 through 10/16 Meropenem 10/16 >> 10/20   Time spent: 15 minutes    Junious Silk ANP  [facility] 7 am - 330 pm/M-F for direct patient care and secure chat Please refer to Amion for contact info 83  days

## 2021-07-22 LAB — GLUCOSE, CAPILLARY
Glucose-Capillary: 126 mg/dL — ABNORMAL HIGH (ref 70–99)
Glucose-Capillary: 131 mg/dL — ABNORMAL HIGH (ref 70–99)
Glucose-Capillary: 133 mg/dL — ABNORMAL HIGH (ref 70–99)
Glucose-Capillary: 138 mg/dL — ABNORMAL HIGH (ref 70–99)

## 2021-07-22 NOTE — Plan of Care (Signed)
  Problem: Role Relationship: Goal: Method of communication will improve Outcome: Adequate for Discharge   Problem: Education: Goal: Knowledge of General Education information will improve Description: Including pain rating scale, medication(s)/side effects and non-pharmacologic comfort measures Outcome: Adequate for Discharge   Problem: Clinical Measurements: Goal: Ability to maintain clinical measurements within normal limits will improve Outcome: Adequate for Discharge Goal: Will remain free from infection Outcome: Adequate for Discharge Goal: Diagnostic test results will improve Outcome: Adequate for Discharge Goal: Respiratory complications will improve Outcome: Adequate for Discharge Goal: Cardiovascular complication will be avoided Outcome: Adequate for Discharge   Problem: Activity: Goal: Risk for activity intolerance will decrease Outcome: Adequate for Discharge   Problem: Nutrition: Goal: Adequate nutrition will be maintained Outcome: Adequate for Discharge   Problem: Coping: Goal: Level of anxiety will decrease Outcome: Adequate for Discharge   Problem: Elimination: Goal: Will not experience complications related to bowel motility Outcome: Adequate for Discharge Goal: Will not experience complications related to urinary retention Outcome: Adequate for Discharge   Problem: Pain Managment: Goal: General experience of comfort will improve Outcome: Adequate for Discharge   Problem: Safety: Goal: Ability to remain free from injury will improve Outcome: Adequate for Discharge   Problem: Skin Integrity: Goal: Risk for impaired skin integrity will decrease Outcome: Adequate for Discharge

## 2021-07-22 NOTE — Progress Notes (Signed)
Patient discharged at this time via [facility] transportation. Patient hemodynamically stable during discharge.

## 2021-07-22 NOTE — Progress Notes (Signed)
Patient will discharge to [facility] in [place], [place]. Patient will be transported via [facility] - pick up has been scheduled for 12:30pm. The number to call for report is [phone].  Patient's sister is aware of discharge plan.  Edwin Dada, MSW, LCSW Transitions of Care  Clinical Social Worker II [phone]

## 2021-07-22 NOTE — Plan of Care (Signed)
  Problem: Role Relationship: Goal: Method of communication will improve Outcome: Progressing   Problem: Education: Goal: Knowledge of General Education information will improve Description: Including pain rating scale, medication(s)/side effects and non-pharmacologic comfort measures Outcome: Progressing   Problem: Health Behavior/Discharge Planning: Goal: Ability to manage health-related needs will improve Outcome: Not Applicable   Problem: Clinical Measurements: Goal: Ability to maintain clinical measurements within normal limits will improve Outcome: Progressing Goal: Will remain free from infection Outcome: Progressing Goal: Diagnostic test results will improve Outcome: Progressing Goal: Respiratory complications will improve Outcome: Progressing Goal: Cardiovascular complication will be avoided Outcome: Progressing   Problem: Activity: Goal: Risk for activity intolerance will decrease Outcome: Progressing   Problem: Nutrition: Goal: Adequate nutrition will be maintained Outcome: Progressing   Problem: Elimination: Goal: Will not experience complications related to bowel motility Outcome: Progressing Goal: Will not experience complications related to urinary retention Outcome: Progressing   Problem: Pain Managment: Goal: General experience of comfort will improve Outcome: Progressing   Problem: Skin Integrity: Goal: Risk for impaired skin integrity will decrease Outcome: Progressing

## 2021-07-22 NOTE — Progress Notes (Signed)
Report called to RN Vernona Rieger Grooms at [facility] care SNF. Questions and concerns answered. Informed the nurse about patient's trach, PEG and feeding.

## 2021-08-22 ENCOUNTER — Encounter (HOSPITAL_COMMUNITY): Payer: Self-pay | Admitting: Radiology

## 2022-07-03 IMAGING — DX DG ABDOMEN 1V
2 series · 2 of 2 positions shown · non-contrast
Comparison: Abdominal radiograph dated 04/30/2021.

CLINICAL DATA: Abdominal distension.

EXAM:
ABDOMEN - 1 VIEW

[abdomen kub (1 of 2)]
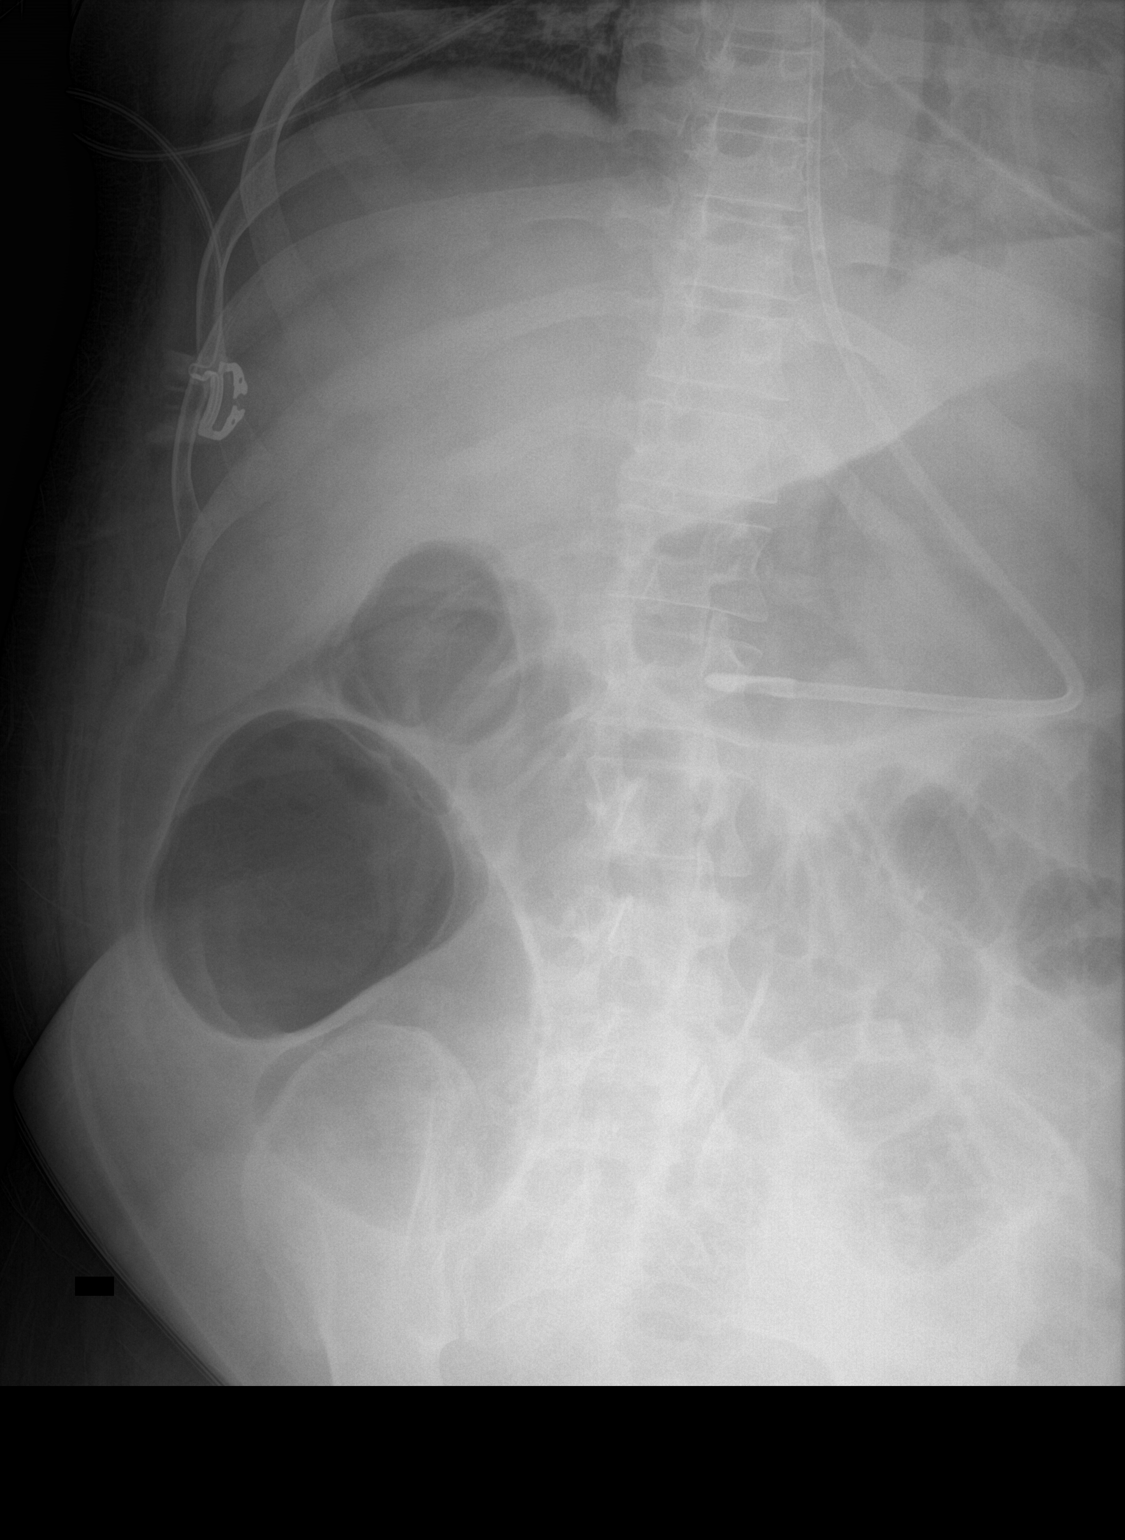

[abdomen kub (2 of 2)]
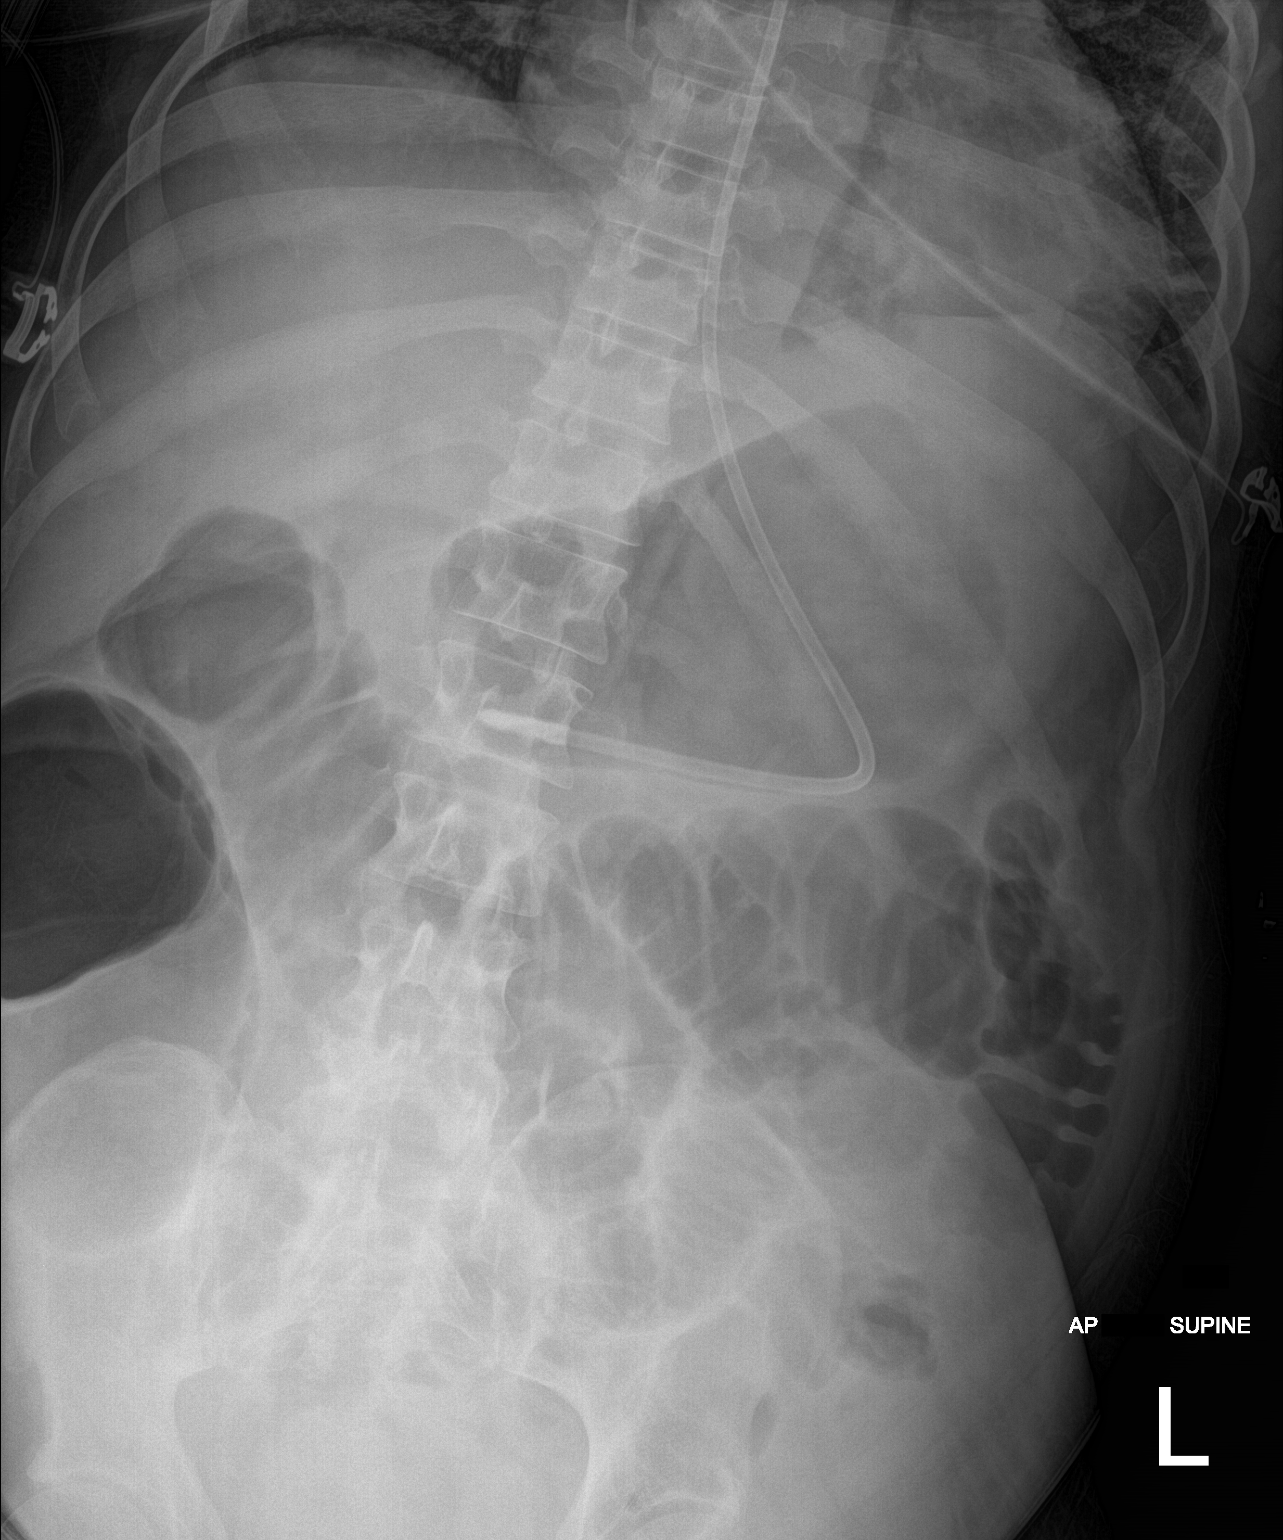

[2 of 2 positions shown; findings below may reference images not displayed]

FINDINGS: An enteric tube terminates in the stomach. A dilated loop of large
bowel in the right lower quadrant measures 9.6 cm in diameter.
Multiple dilated loops of small bowel are noted in the mid and left
hemiabdomen. Air-fluid levels and free intraperitoneal air cannot be
excluded on the supine exam.
IMPRESSION: Dilated loops of small and large bowel may represent obstruction
versus ileus. Upright abdominal radiographs could be performed to
evaluate for air-fluid levels.

## 2022-08-07 IMAGING — DX DG CHEST 1V PORT
1 series · 1 of 1 positions shown · non-contrast
Comparison: 05/30/2021

CLINICAL DATA: Dyspnea, unresponsive

EXAM:
PORTABLE CHEST 1 VIEW

[chest]
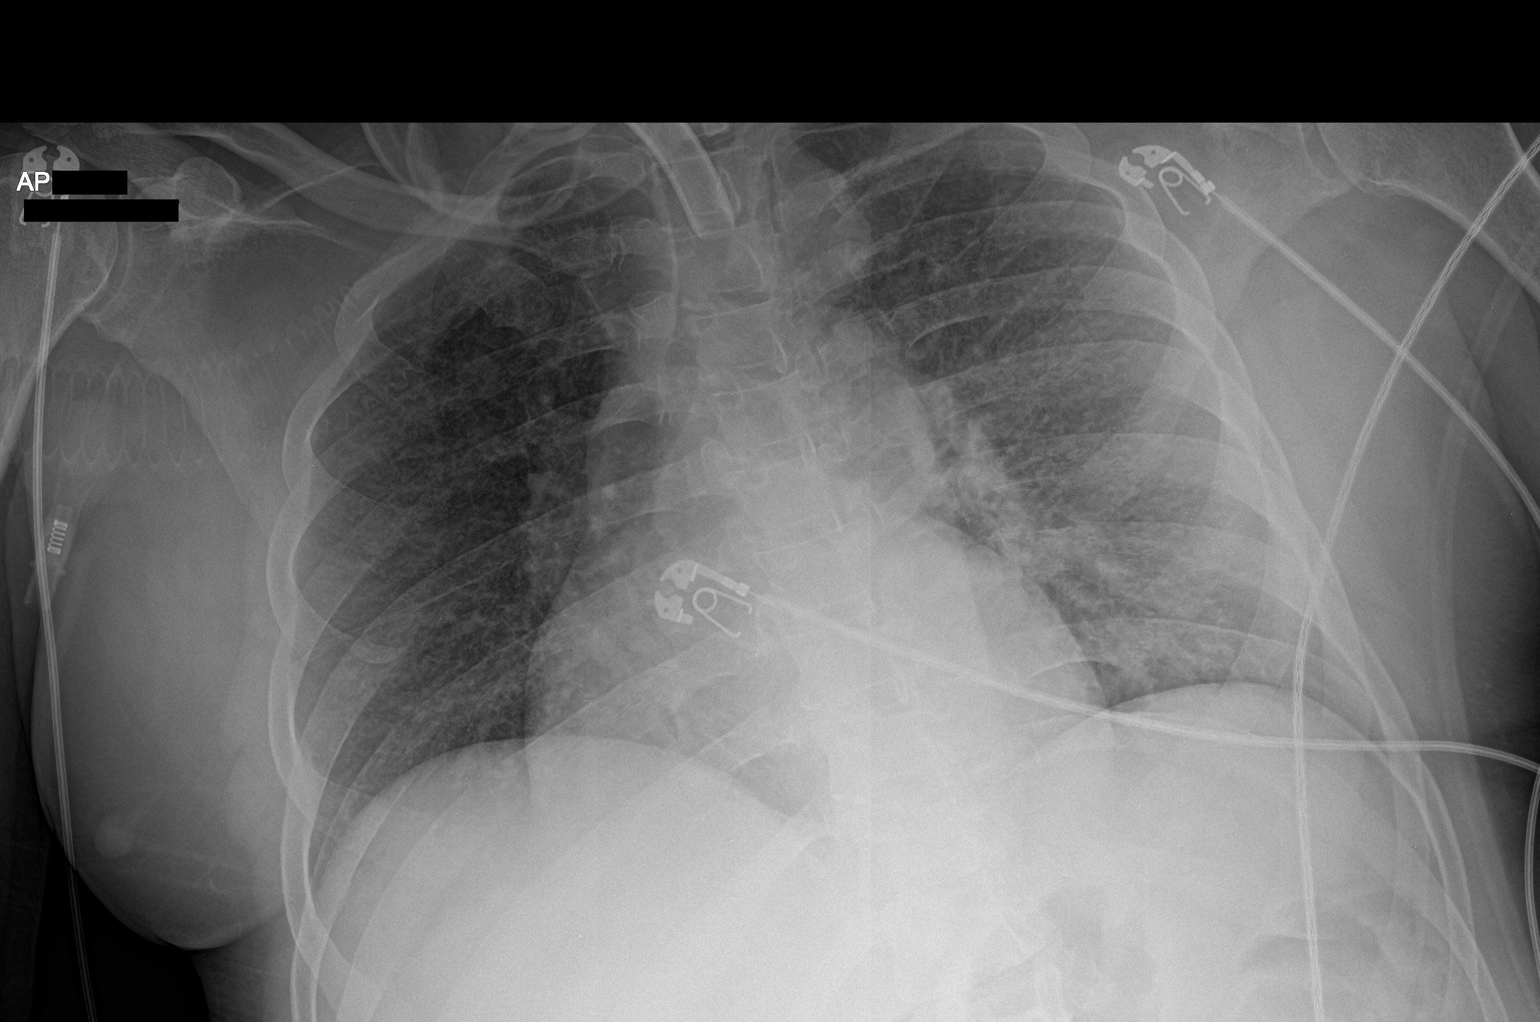

[1 of 1 positions shown; findings below may reference images not displayed]

FINDINGS: Single frontal view of the chest demonstrates a stable tracheostomy
tube. Cardiac silhouette is unremarkable. There is mild diffuse
interstitial prominence without focal consolidation, effusion, or
pneumothorax. No acute bony abnormalities.
IMPRESSION: 1. Mild diffuse interstitial prominence which could reflect early
interstitial edema. No acute airspace disease.

## 2022-08-11 IMAGING — DX DG ABD PORTABLE 1V
2 series · 2 of 2 positions shown · non-contrast
Comparison: Chest CT dated June 10, 2021; x-ray abdomen dated
July 22, 2021

CLINICAL DATA: Abdominal distention

EXAM:
PORTABLE CHEST 1 VIEW

[abdomen kub (1 of 2)]
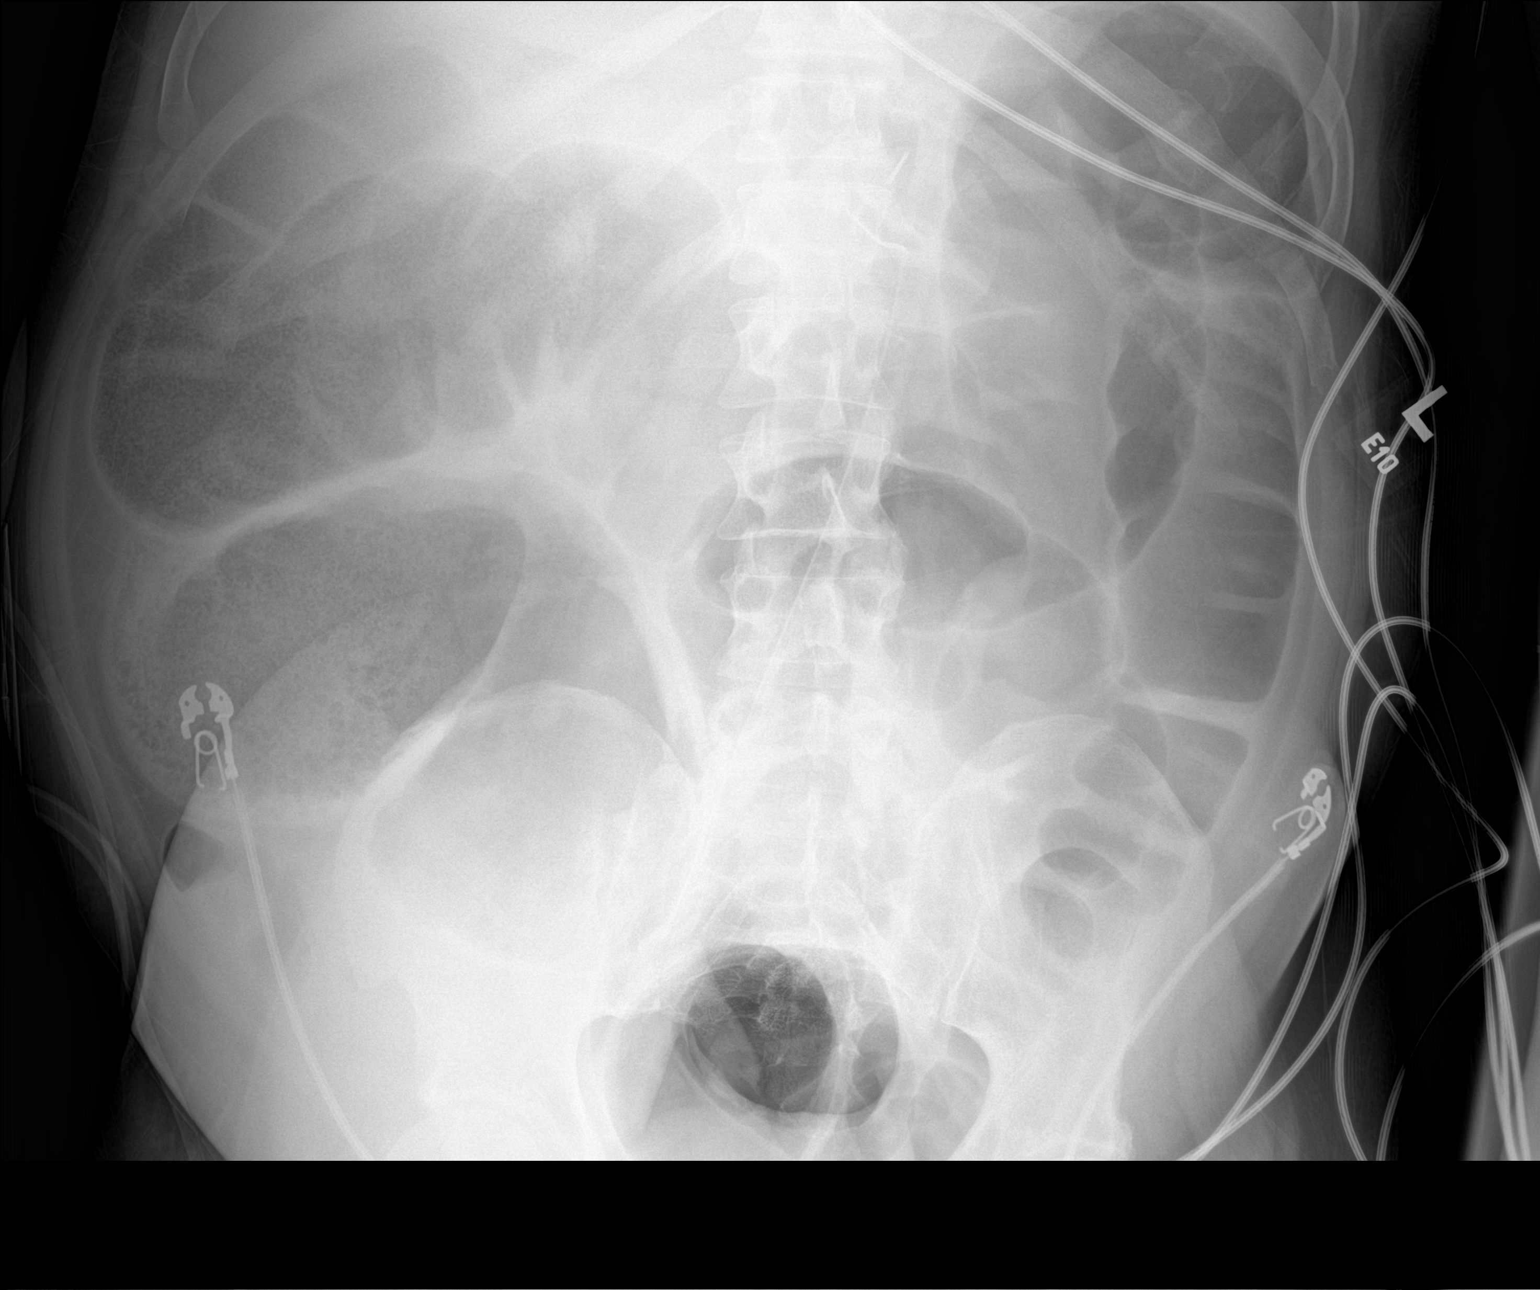

[abdomen kub (2 of 2)]
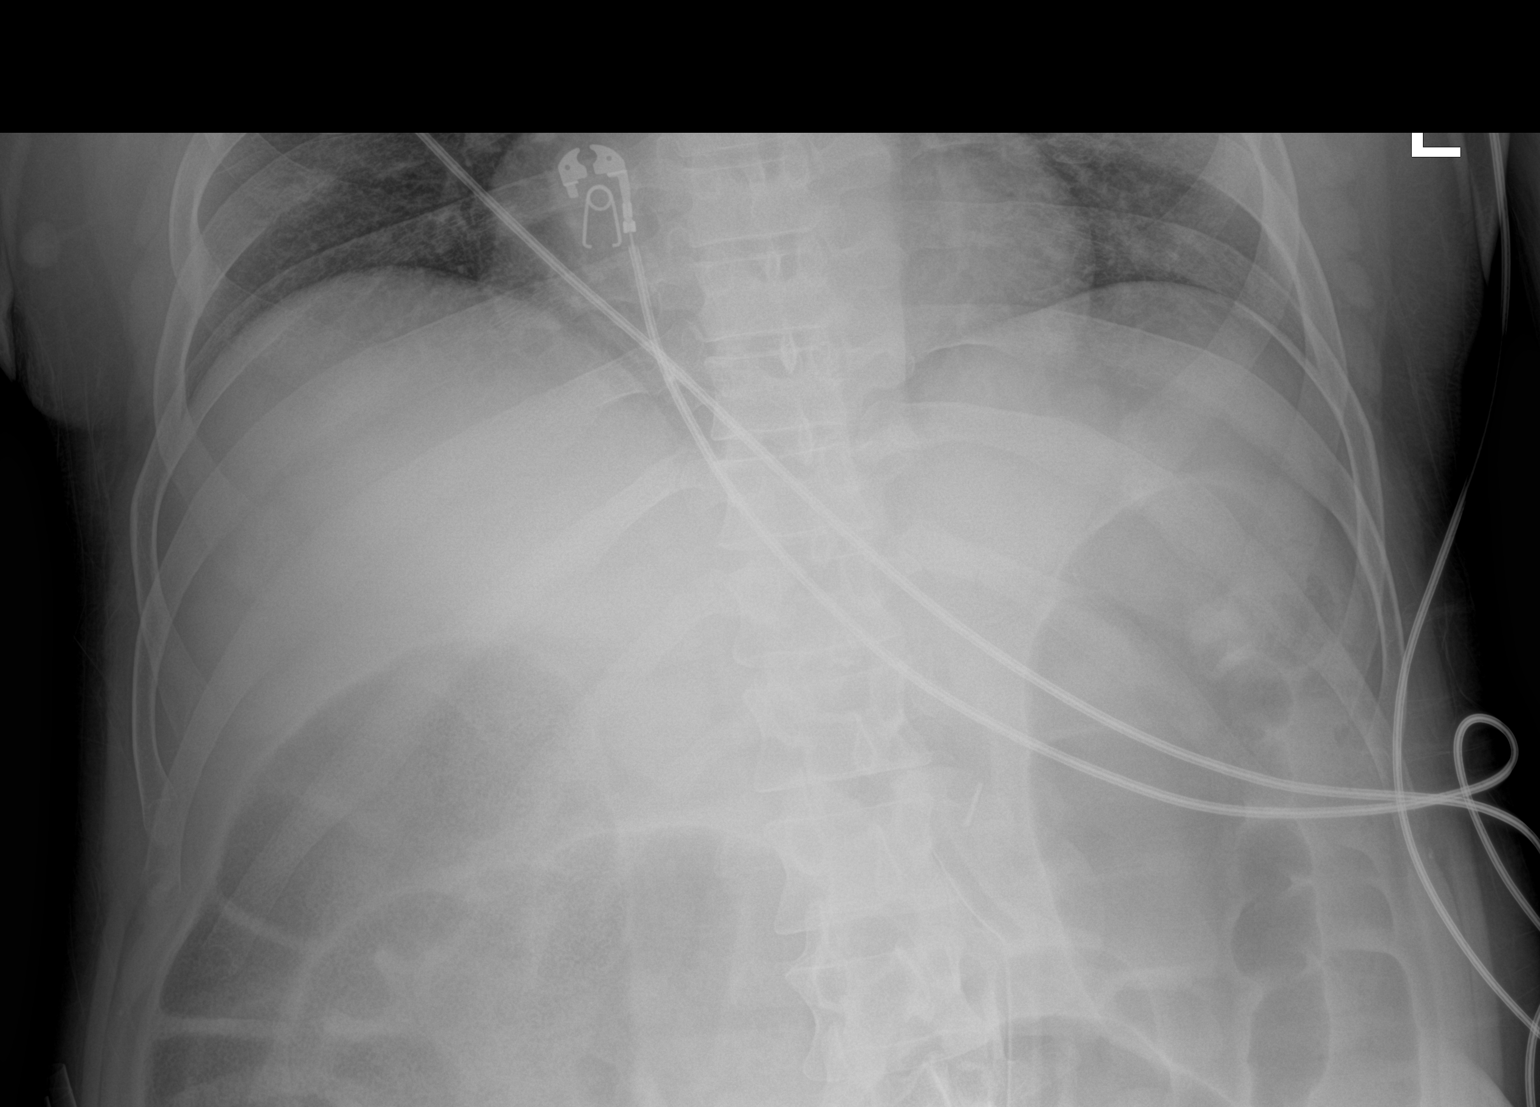

[2 of 2 positions shown; findings below may reference images not displayed]

FINDINGS: Chest: Tracheostomy tube in place. Cardiac and mediastinal contours
are unchanged. Unchanged mild bilateral interstitial opacities no
new parenchymal process. No large pleural effusion or pneumothorax.

Abdomen: Increased distention of air-filled bowel loops when
compared with prior exam. No evidence of pneumatosis. No acute
osseous abnormality. Gastrostomy tube in place.
IMPRESSION: Increased distention of multiple air-filled bowel loops, concerning
for obstruction or ileus.

No new parenchymal process in the chest.
